# Patient Record
Sex: Male | Born: 1940 | Race: White | Hispanic: No | Marital: Married | Smoking: Former smoker
Health system: Southern US, Community
[De-identification: ages and names within clinical notes are randomized; demographics above are authoritative.]

## PROBLEM LIST (undated history)

## (undated) DIAGNOSIS — I10 Essential (primary) hypertension: Secondary | ICD-10-CM

## (undated) DIAGNOSIS — N189 Chronic kidney disease, unspecified: Secondary | ICD-10-CM

## (undated) DIAGNOSIS — Z5189 Encounter for other specified aftercare: Secondary | ICD-10-CM

## (undated) DIAGNOSIS — D649 Anemia, unspecified: Secondary | ICD-10-CM

## (undated) DIAGNOSIS — E119 Type 2 diabetes mellitus without complications: Secondary | ICD-10-CM

## (undated) DIAGNOSIS — L570 Actinic keratosis: Secondary | ICD-10-CM

## (undated) DIAGNOSIS — Z87442 Personal history of urinary calculi: Secondary | ICD-10-CM

## (undated) DIAGNOSIS — I499 Cardiac arrhythmia, unspecified: Secondary | ICD-10-CM

## (undated) HISTORY — DX: Actinic keratosis: L57.0

## (undated) HISTORY — PX: TONSILLECTOMY: SUR1361

---

## 2010-07-04 DIAGNOSIS — R809 Proteinuria, unspecified: Secondary | ICD-10-CM | POA: Insufficient documentation

## 2011-02-20 DIAGNOSIS — E669 Obesity, unspecified: Secondary | ICD-10-CM | POA: Insufficient documentation

## 2011-02-20 DIAGNOSIS — R011 Cardiac murmur, unspecified: Secondary | ICD-10-CM | POA: Insufficient documentation

## 2011-02-20 DIAGNOSIS — E1142 Type 2 diabetes mellitus with diabetic polyneuropathy: Secondary | ICD-10-CM | POA: Insufficient documentation

## 2011-02-20 DIAGNOSIS — R200 Anesthesia of skin: Secondary | ICD-10-CM | POA: Insufficient documentation

## 2012-02-06 ENCOUNTER — Ambulatory Visit: Payer: Self-pay | Admitting: Oncology

## 2012-02-27 ENCOUNTER — Inpatient Hospital Stay: Payer: Self-pay | Admitting: Internal Medicine

## 2012-02-27 LAB — COMPREHENSIVE METABOLIC PANEL
Albumin: 3.3 g/dL — ABNORMAL LOW (ref 3.4–5.0)
Alkaline Phosphatase: 22 U/L — ABNORMAL LOW (ref 50–136)
Anion Gap: 5 — ABNORMAL LOW (ref 7–16)
Calcium, Total: 8.5 mg/dL (ref 8.5–10.1)
Chloride: 107 mmol/L (ref 98–107)
Co2: 27 mmol/L (ref 21–32)
Creatinine: 2.15 mg/dL — ABNORMAL HIGH (ref 0.60–1.30)
EGFR (Non-African Amer.): 30 — ABNORMAL LOW
Total Protein: 6 g/dL — ABNORMAL LOW (ref 6.4–8.2)

## 2012-02-27 LAB — CBC WITH DIFFERENTIAL/PLATELET
Basophil #: 0 10*3/uL (ref 0.0–0.1)
Basophil %: 0.4 %
Eosinophil #: 0 10*3/uL (ref 0.0–0.7)
Eosinophil %: 0.9 %
HGB: 4.7 g/dL — CL (ref 13.0–18.0)
Lymphocyte #: 0.5 10*3/uL — ABNORMAL LOW (ref 1.0–3.6)
Lymphocyte %: 18 %
MCH: 24.3 pg — ABNORMAL LOW (ref 26.0–34.0)
MCHC: 32.5 g/dL (ref 32.0–36.0)
Neutrophil %: 73.9 %
Platelet: 103 10*3/uL — ABNORMAL LOW (ref 150–440)
RDW: 17.3 % — ABNORMAL HIGH (ref 11.5–14.5)

## 2012-02-27 LAB — PROTIME-INR
INR: 1.1
Prothrombin Time: 14.3 secs (ref 11.5–14.7)

## 2012-02-28 LAB — BASIC METABOLIC PANEL
BUN: 20 mg/dL — ABNORMAL HIGH (ref 7–18)
Calcium, Total: 8.3 mg/dL — ABNORMAL LOW (ref 8.5–10.1)
Co2: 23 mmol/L (ref 21–32)
EGFR (African American): 42 — ABNORMAL LOW
EGFR (Non-African Amer.): 36 — ABNORMAL LOW
Glucose: 153 mg/dL — ABNORMAL HIGH (ref 65–99)
Osmolality: 283 (ref 275–301)
Sodium: 139 mmol/L (ref 136–145)

## 2012-02-28 LAB — IRON AND TIBC
Iron Saturation: 8 %
Iron: 36 ug/dL — ABNORMAL LOW (ref 65–175)
Unbound Iron-Bind.Cap.: 436 ug/dL

## 2012-02-28 LAB — CBC WITH DIFFERENTIAL/PLATELET
Basophil #: 0 10*3/uL (ref 0.0–0.1)
Basophil %: 0.8 %
Eosinophil #: 0.1 10*3/uL (ref 0.0–0.7)
HCT: 18.3 % — ABNORMAL LOW (ref 40.0–52.0)
Lymphocyte %: 21.4 %
MCH: 25.6 pg — ABNORMAL LOW (ref 26.0–34.0)
MCHC: 33 g/dL (ref 32.0–36.0)
Neutrophil #: 2.5 10*3/uL (ref 1.4–6.5)
Neutrophil %: 69.5 %
Platelet: 107 10*3/uL — ABNORMAL LOW (ref 150–440)
RDW: 18.6 % — ABNORMAL HIGH (ref 11.5–14.5)
WBC: 3.6 10*3/uL — ABNORMAL LOW (ref 3.8–10.6)

## 2012-02-28 LAB — LACTATE DEHYDROGENASE: LDH: 145 U/L (ref 85–241)

## 2012-02-28 LAB — FOLATE: Folic Acid: 10.7 ng/mL (ref 3.1–100.0)

## 2012-02-29 LAB — HEMOGLOBIN: HGB: 6.9 g/dL — ABNORMAL LOW (ref 13.0–18.0)

## 2012-03-02 LAB — PROT IMMUNOELECTROPHORES(ARMC)

## 2012-03-03 LAB — URINE IEP, RANDOM

## 2012-03-07 ENCOUNTER — Ambulatory Visit: Payer: Self-pay | Admitting: Oncology

## 2012-03-08 ENCOUNTER — Ambulatory Visit: Payer: Self-pay | Admitting: Oncology

## 2012-03-08 LAB — CBC CANCER CENTER
Basophil %: 0.8 %
Eosinophil %: 1.1 %
HCT: 22.7 % — ABNORMAL LOW (ref 40.0–52.0)
HGB: 7.2 g/dL — ABNORMAL LOW (ref 13.0–18.0)
Lymphocyte #: 0.5 x10 3/mm — ABNORMAL LOW (ref 1.0–3.6)
MCH: 24 pg — ABNORMAL LOW (ref 26.0–34.0)
MCHC: 31.8 g/dL — ABNORMAL LOW (ref 32.0–36.0)
MCV: 75 fL — ABNORMAL LOW (ref 80–100)
Monocyte #: 0.2 x10 3/mm (ref 0.2–1.0)
Platelet: 118 x10 3/mm — ABNORMAL LOW (ref 150–440)
RBC: 3.02 10*6/uL — ABNORMAL LOW (ref 4.40–5.90)

## 2012-03-16 LAB — CBC CANCER CENTER
Basophil #: 0 x10 3/mm (ref 0.0–0.1)
Eosinophil #: 0.1 x10 3/mm (ref 0.0–0.7)
Eosinophil %: 1.3 %
Eosinophil: 2 %
HCT: 24.9 % — ABNORMAL LOW (ref 40.0–52.0)
Lymphocyte %: 15.6 %
MCH: 23.9 pg — ABNORMAL LOW (ref 26.0–34.0)
MCHC: 32.5 g/dL (ref 32.0–36.0)
MCV: 74 fL — ABNORMAL LOW (ref 80–100)
Monocytes: 9 %
Neutrophil #: 3.3 x10 3/mm (ref 1.4–6.5)
Neutrophil %: 74.7 %
Platelet: 220 x10 3/mm (ref 150–440)
RDW: 19.3 % — ABNORMAL HIGH (ref 11.5–14.5)
Segmented Neutrophils: 79 %
WBC: 4.4 x10 3/mm (ref 3.8–10.6)

## 2012-04-07 ENCOUNTER — Ambulatory Visit: Payer: Self-pay | Admitting: Oncology

## 2012-05-08 ENCOUNTER — Ambulatory Visit: Payer: Self-pay | Admitting: Oncology

## 2012-05-24 LAB — CBC CANCER CENTER
Basophil #: 0 x10 3/mm (ref 0.0–0.1)
Basophil %: 0.6 %
HGB: 12.1 g/dL — ABNORMAL LOW (ref 13.0–18.0)
Lymphocyte %: 20.9 %
MCH: 26 pg (ref 26.0–34.0)
MCHC: 33.4 g/dL (ref 32.0–36.0)
Neutrophil #: 2.8 x10 3/mm (ref 1.4–6.5)
Neutrophil %: 70.7 %
Platelet: 129 x10 3/mm — ABNORMAL LOW (ref 150–440)
RBC: 4.64 10*6/uL (ref 4.40–5.90)
RDW: 21.7 % — ABNORMAL HIGH (ref 11.5–14.5)
WBC: 3.9 x10 3/mm (ref 3.8–10.6)

## 2012-05-24 LAB — IRON AND TIBC
Iron Saturation: 13 %
Unbound Iron-Bind.Cap.: 346 ug/dL

## 2012-05-24 LAB — FERRITIN: Ferritin (ARMC): 23 ng/mL (ref 8–388)

## 2012-06-05 ENCOUNTER — Ambulatory Visit: Payer: Self-pay | Admitting: Oncology

## 2012-08-09 ENCOUNTER — Ambulatory Visit: Payer: Self-pay | Admitting: Family Medicine

## 2012-08-09 IMAGING — CT CT ABD-PELV W/O CM
1 of 2 series · 15 of 32 positions shown, 19 images · non-contrast
Comparison: none

REASON FOR EXAM: Abd Pain RUQ
COMMENTS:

[Series 2: abd 3mm wo 3.0 i40f 3 · axial · 0.96mm/px · z∈[-544,-55]mm · 15 of 181 slices shown, 19 images]
[im 9/181  soft-tissue]
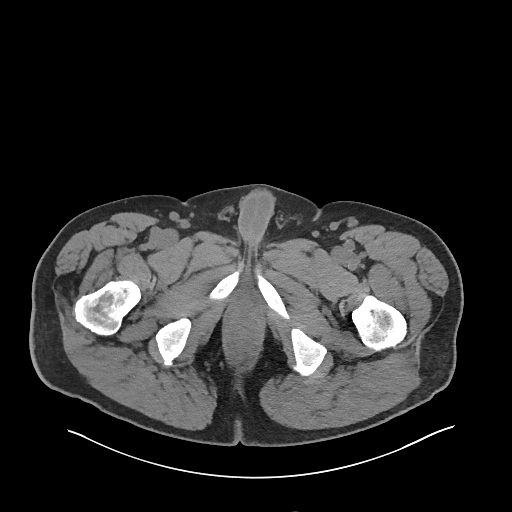
[im 9/181  bone]
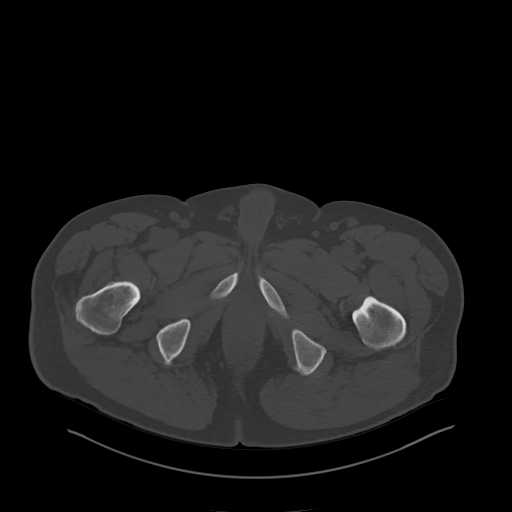
[im 25/181  soft-tissue]
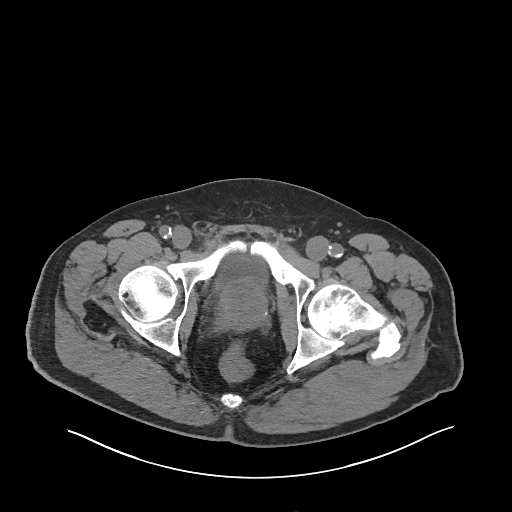
[im 41/181  soft-tissue]
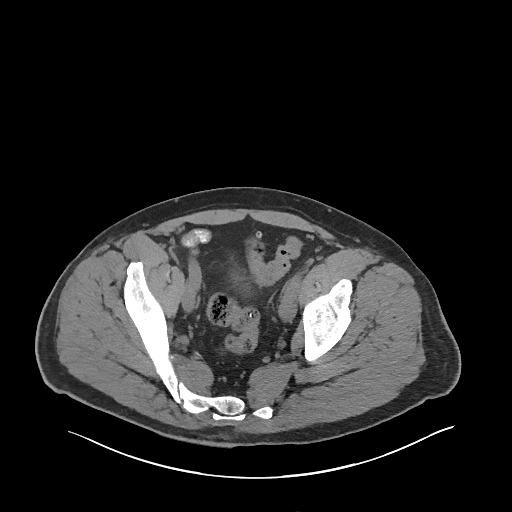
[im 50/181  soft-tissue]
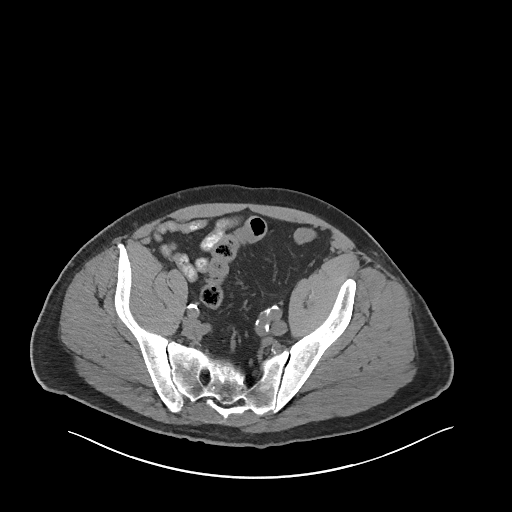
[im 66/181  soft-tissue]
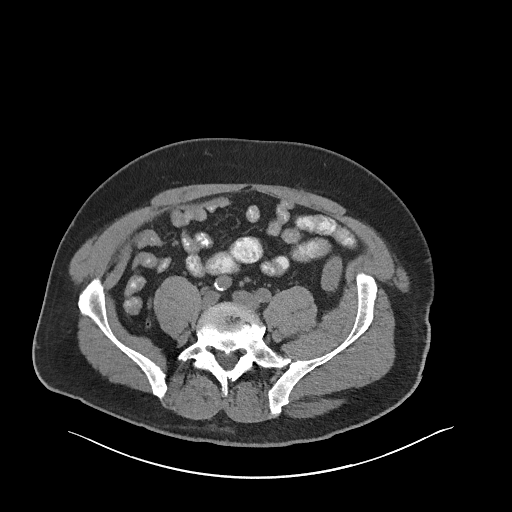
[im 74/181  soft-tissue]
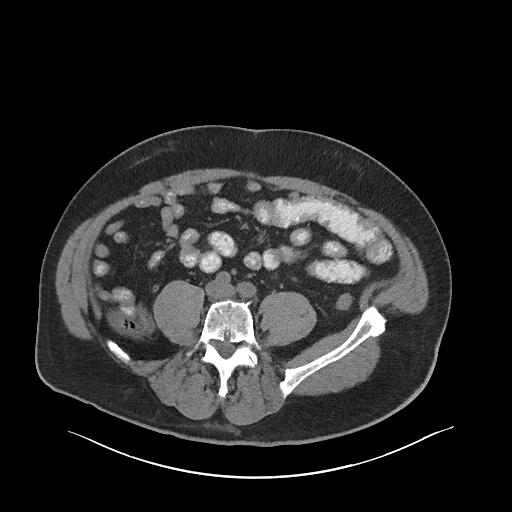
[im 91/181  soft-tissue]
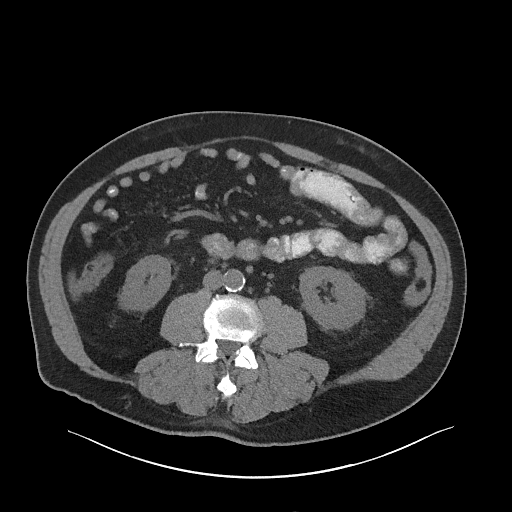
[im 107/181  soft-tissue]
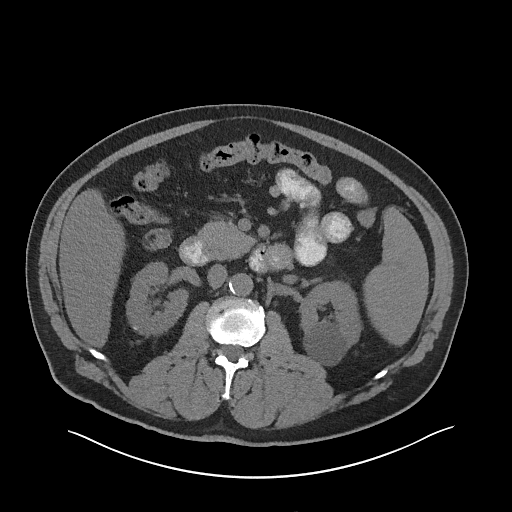
[im 115/181  soft-tissue]
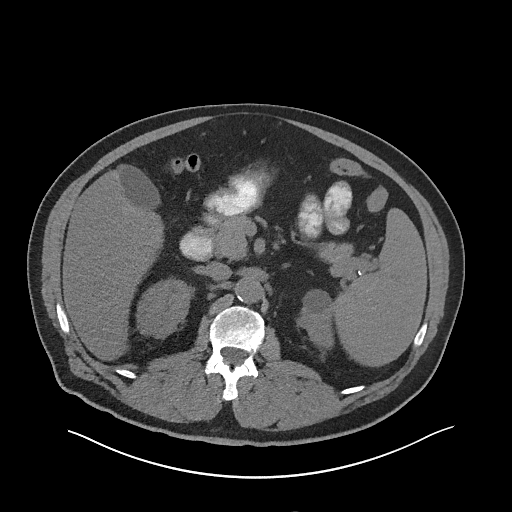
[im 115/181  bone]
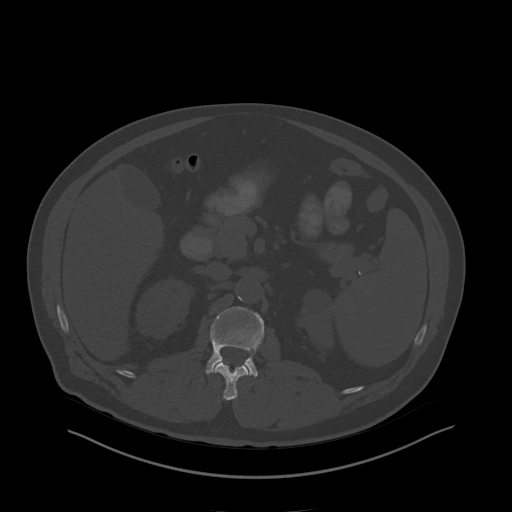
[im 131/181  soft-tissue]
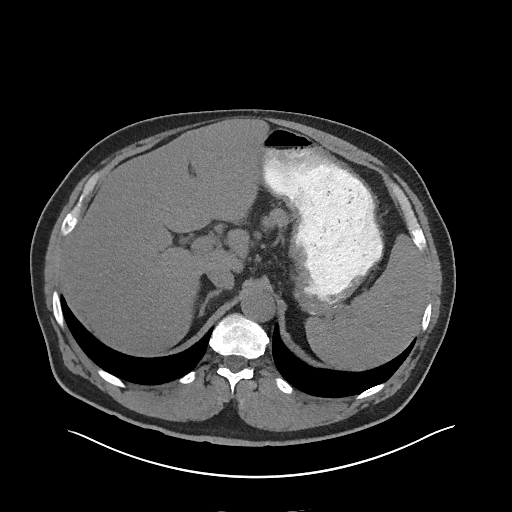
[im 140/181  soft-tissue]
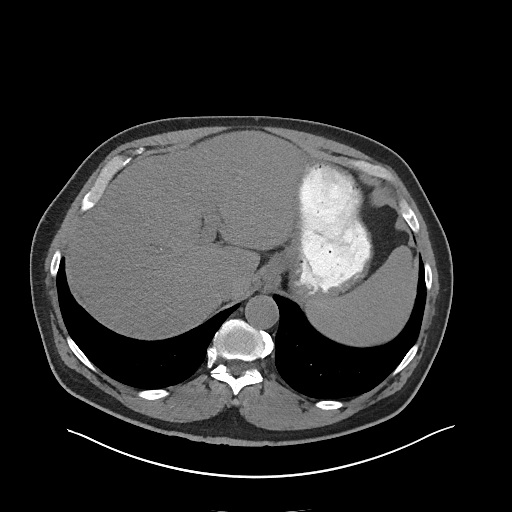
[im 148/181  lung]
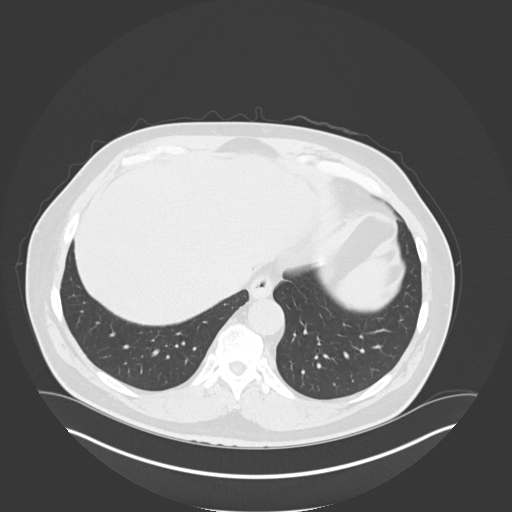
[im 156/181  soft-tissue]
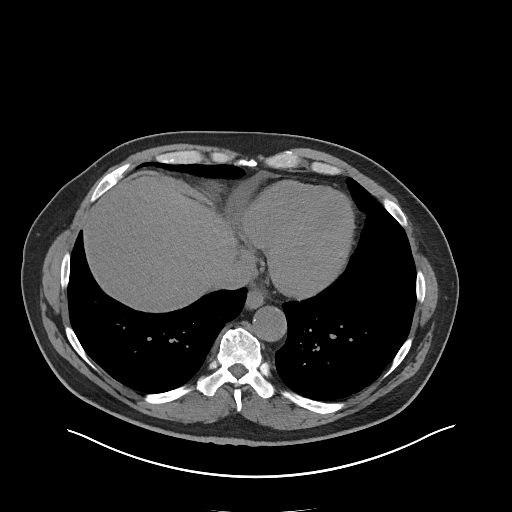
[im 156/181  lung]
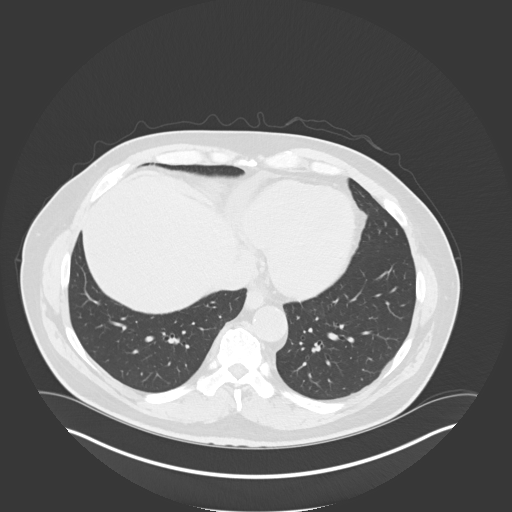
[im 164/181  lung]
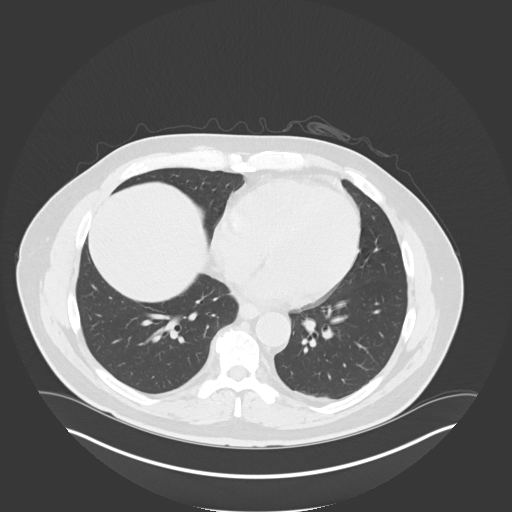
[im 172/181  soft-tissue]
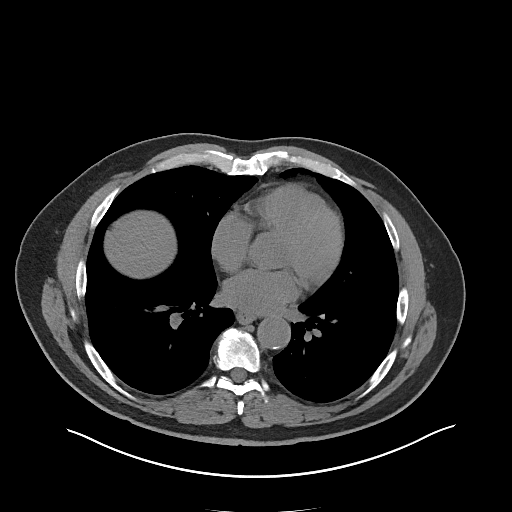
[im 172/181  lung]
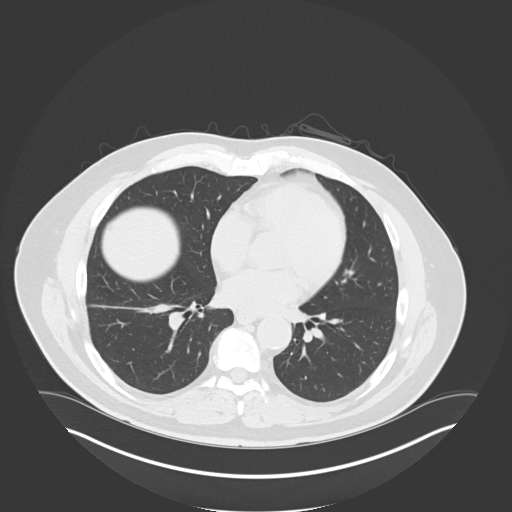

[15 of 32 positions shown; findings below may reference images not displayed]

PROCEDURE:     KCT - KCT ABDOMEN/PELVIS WO  - [DATE] [DATE]

RESULT:     CT of the abdomen and pelvis is performed utilizing oral
contrast only. The patient has abnormal renal function with an estimated GFR
of 40.

Multislice helical acquisition is reconstructed at 3 mm slice thickness
increments the patient had received oral contrast. The lung bases appear
clear. There appears to be a single stone in the gallbladder on image 60. A
nonobstructing lower pole left renal calculus is best appreciated on images
89 and 90 measuring approximately 6.5 mm in diameter. Low-attenuation
lesions are seen in the mid and upper pole region of the left kidney
consistent with cysts. The appendix appears normal. Atherosclerotic
calcification without aneurysm is seen within the abdominal aorta. There is
no ascites. The prostate is enlarged with calcification present. Correlate
with digital rectal exam and PSA. The adrenal glands appear to be
unremarkable. No pancreatic masses appreciated on this noncontrast study. No
hepatic or pancreatic ductal dilation is evident. The spleen just superior
to inferior dimension of approximately 16 cm. The descending colon a
relatively collapsed. There is no abnormal small bowel or gastric distention
or wall thickening. The abdominal wall appears intact. No adenopathy is
evident.
IMPRESSION: 1. Cholelithiasis.
2. Left nephrolithiasis. Probable left renal cystic masses. Mild
splenomegaly. Further assessment is limited given the lack of IV contrast.
3. Atherosclerotic calcification present.

[REDACTED]

## 2012-08-19 ENCOUNTER — Ambulatory Visit: Payer: Self-pay | Admitting: Oncology

## 2012-08-23 DIAGNOSIS — M6208 Separation of muscle (nontraumatic), other site: Secondary | ICD-10-CM | POA: Insufficient documentation

## 2012-08-23 LAB — CBC CANCER CENTER
Basophil %: 1 %
Eosinophil %: 2.2 %
HCT: 32.5 % — ABNORMAL LOW (ref 40.0–52.0)
Lymphocyte #: 0.8 x10 3/mm — ABNORMAL LOW (ref 1.0–3.6)
Lymphocyte %: 23.1 %
MCH: 29.9 pg (ref 26.0–34.0)
MCHC: 35 g/dL (ref 32.0–36.0)
Neutrophil #: 2.2 x10 3/mm (ref 1.4–6.5)
Neutrophil %: 67.9 %
Platelet: 116 x10 3/mm — ABNORMAL LOW (ref 150–440)
RBC: 3.8 10*6/uL — ABNORMAL LOW (ref 4.40–5.90)

## 2012-08-23 LAB — FERRITIN: Ferritin (ARMC): 33 ng/mL (ref 8–388)

## 2012-08-23 LAB — IRON AND TIBC
Iron Bind.Cap.(Total): 366 ug/dL (ref 250–450)
Iron Saturation: 25 %
Iron: 91 ug/dL (ref 65–175)

## 2012-09-05 ENCOUNTER — Ambulatory Visit: Payer: Self-pay | Admitting: Oncology

## 2012-09-29 DIAGNOSIS — I7090 Unspecified atherosclerosis: Secondary | ICD-10-CM | POA: Insufficient documentation

## 2012-09-29 DIAGNOSIS — N200 Calculus of kidney: Secondary | ICD-10-CM | POA: Insufficient documentation

## 2012-09-29 DIAGNOSIS — K8020 Calculus of gallbladder without cholecystitis without obstruction: Secondary | ICD-10-CM | POA: Insufficient documentation

## 2012-12-24 ENCOUNTER — Ambulatory Visit: Payer: Self-pay | Admitting: Oncology

## 2012-12-24 LAB — CBC CANCER CENTER
Basophil #: 0 x10 3/mm (ref 0.0–0.1)
Basophil %: 1.2 %
Eosinophil #: 0.1 x10 3/mm (ref 0.0–0.7)
Eosinophil %: 1.7 %
HCT: 36.5 % — ABNORMAL LOW (ref 40.0–52.0)
HGB: 12.9 g/dL — ABNORMAL LOW (ref 13.0–18.0)
MCHC: 35.3 g/dL (ref 32.0–36.0)
MCV: 82 fL (ref 80–100)
Monocyte #: 0.2 x10 3/mm (ref 0.2–1.0)
Monocyte %: 5.4 %
Neutrophil #: 2.5 x10 3/mm (ref 1.4–6.5)
Neutrophil %: 68.9 %
Platelet: 125 x10 3/mm — ABNORMAL LOW (ref 150–440)
RBC: 4.43 10*6/uL (ref 4.40–5.90)
RDW: 15.4 % — ABNORMAL HIGH (ref 11.5–14.5)

## 2012-12-24 LAB — IRON AND TIBC
Iron Bind.Cap.(Total): 367 ug/dL (ref 250–450)
Iron Saturation: 21 %
Unbound Iron-Bind.Cap.: 291 ug/dL

## 2012-12-24 LAB — FERRITIN: Ferritin (ARMC): 62 ng/mL (ref 8–388)

## 2013-01-05 ENCOUNTER — Ambulatory Visit: Payer: Self-pay | Admitting: Oncology

## 2013-08-22 ENCOUNTER — Ambulatory Visit: Payer: Self-pay | Admitting: Family Medicine

## 2013-08-22 IMAGING — US ULTRASOUND RETROPERITONEAL COMPLETE
2 series · 14 of 25 positions shown · non-contrast
Comparison: [DATE].

CLINICAL DATA: Abdominal aortic aneurysm.

EXAM:
RETROPERITONEAL ULTRASOUND COMPLETE
TECHNIQUE: Ultrasound examination of the abdominal aorta was performed to
evaluate for abdominal aortic aneurysm. The common iliac arteries,
IVC, and kidneys were also evaluated.

[Series 1: ultrasound retroperitoneal complete · 0.30mm/px · 12 of 57 slices shown (1 of 2)]
[im 1/57]
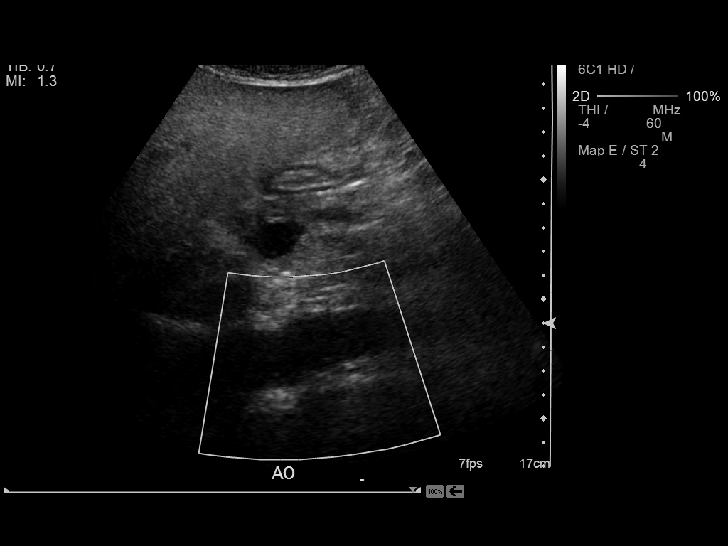
[im 6/57]
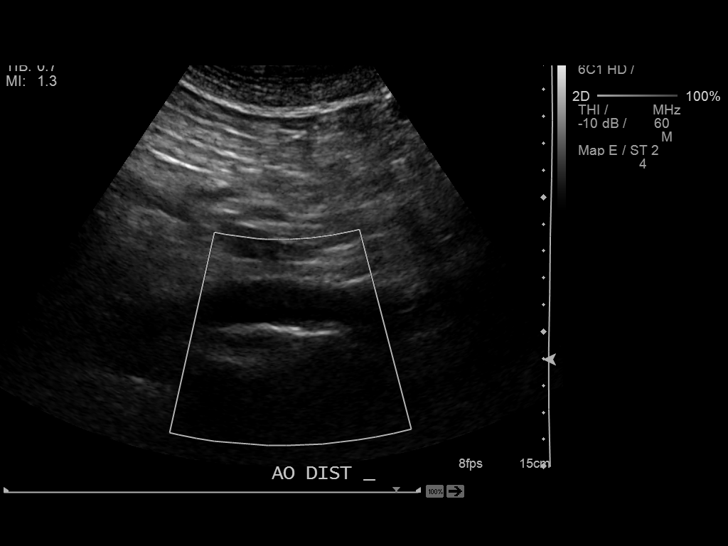
[im 11/57]
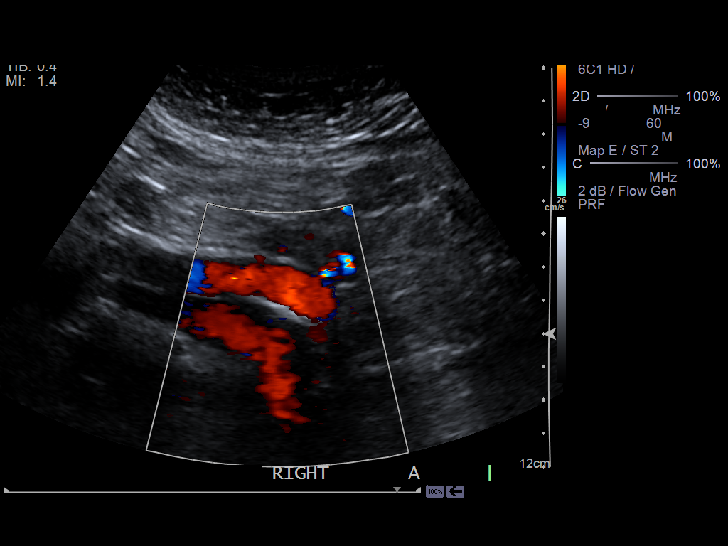
[im 17/57]
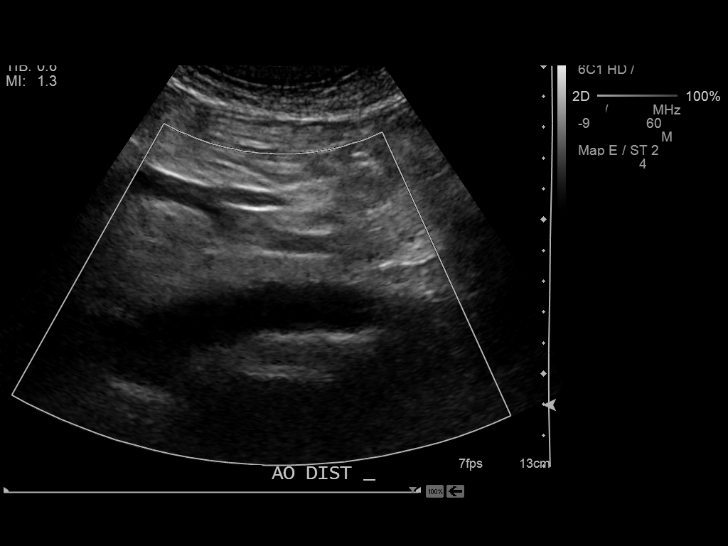
[im 22/57]
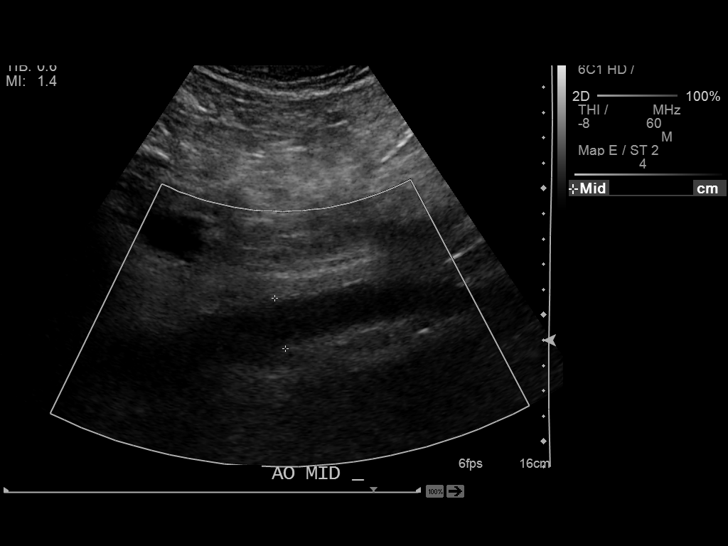
[im 25/57]
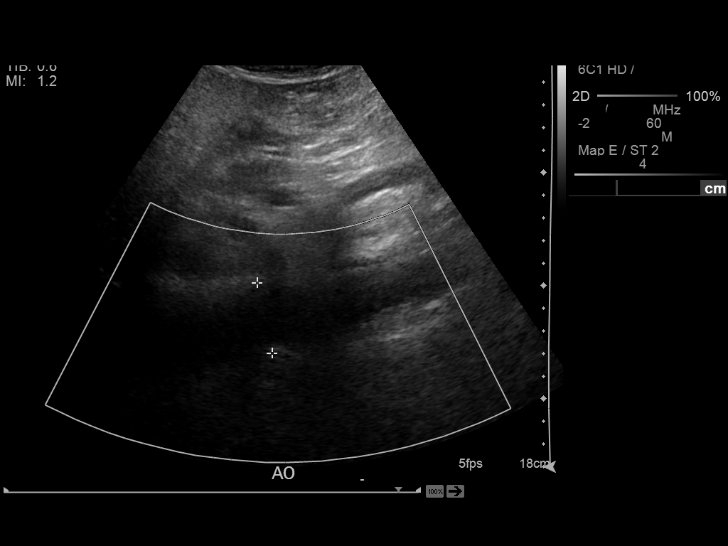
[im 30/57]
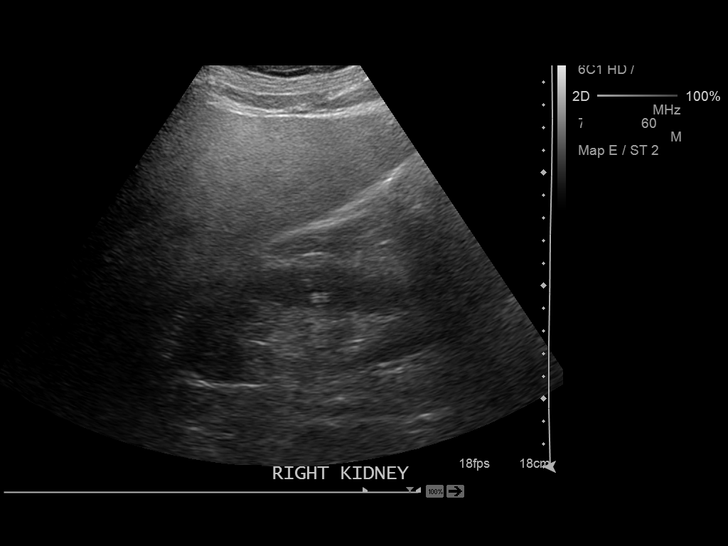
[im 35/57]
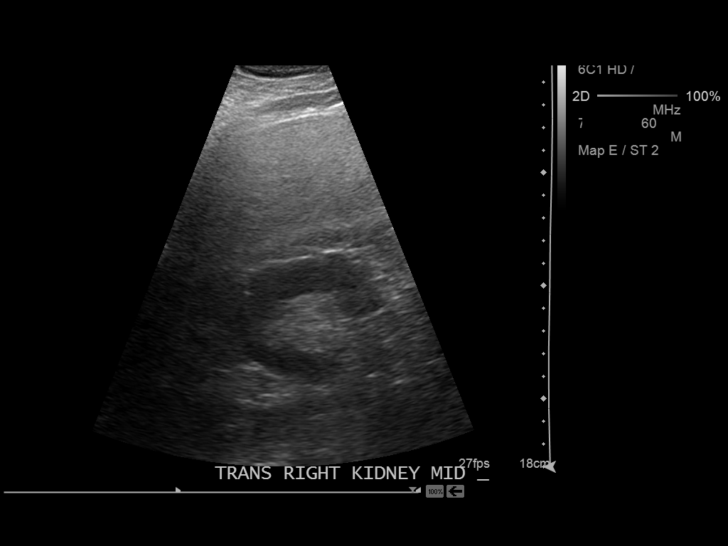
[im 41/57]
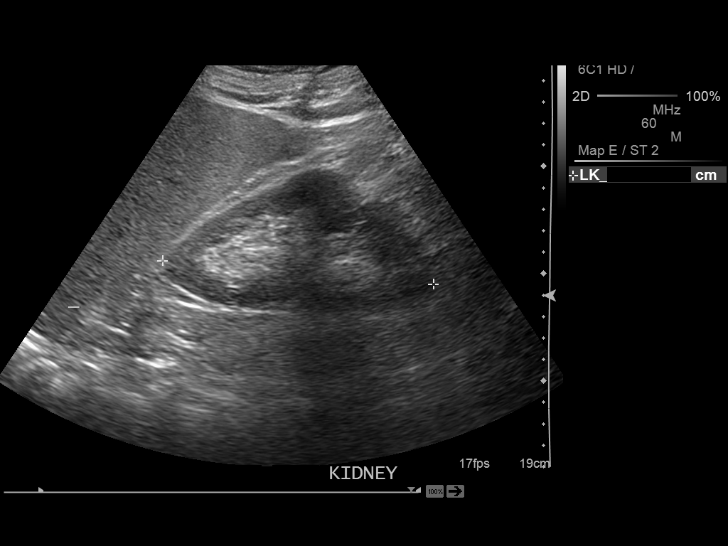
[im 43/57]
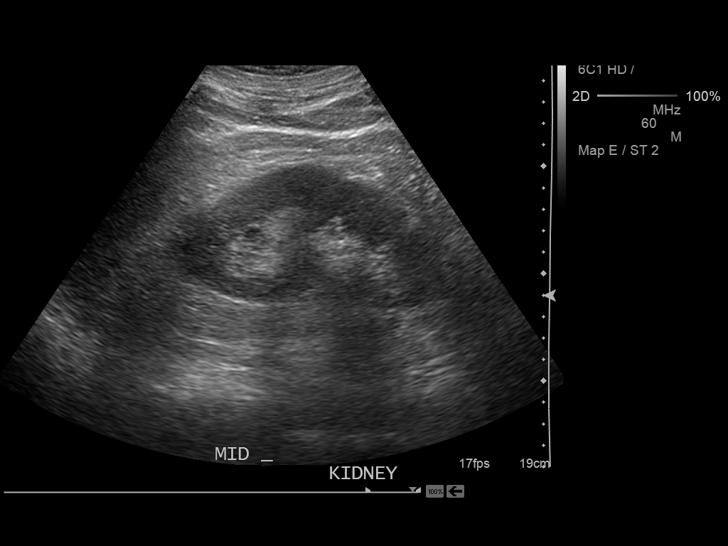
[im 49/57]
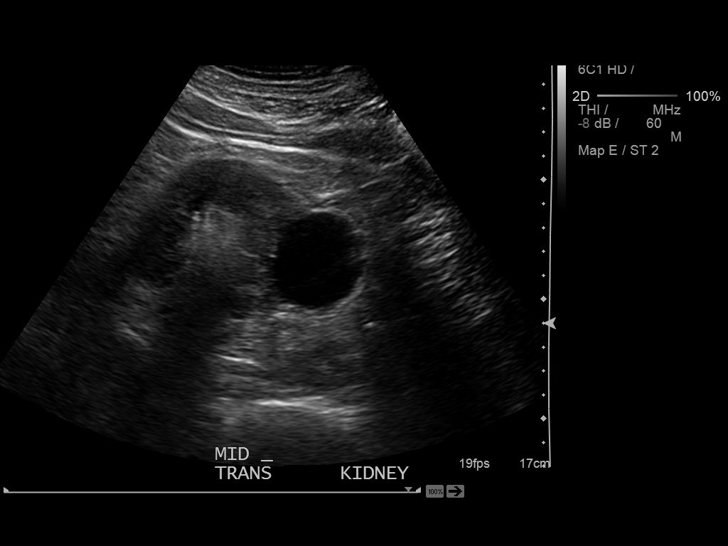
[im 54/57]
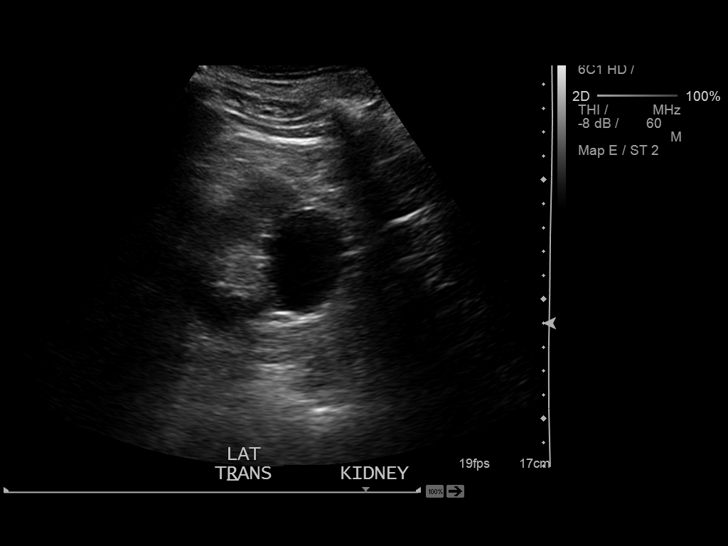

[Series 2001: ultrasound retroperitoneal complete · 0.28mm/px · 2 of 9 slices shown (2 of 2)]
[im 1/9]
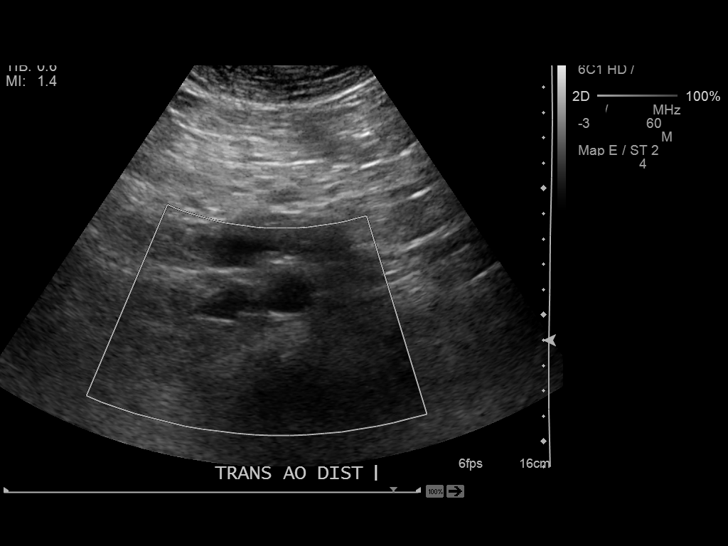
[im 9/9]
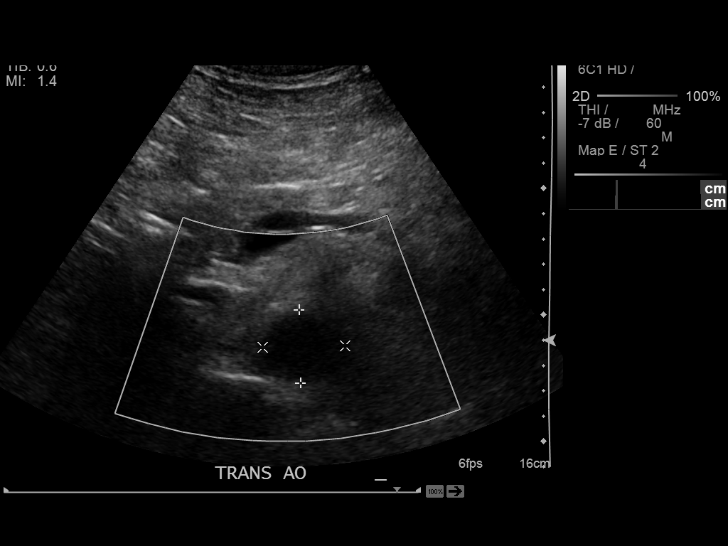

[14 of 25 positions shown; findings below may reference images not displayed]

FINDINGS: Abdominal Aorta

Mild dilation of proximal abdominal aorta.

Maximum AP

Diameter:  2.9 cm

Maximum TRV

Diameter: 3.3 cm

Right Common Iliac Artery

No aneurysm identified.

Left Common Iliac Artery

No aneurysm identified.

IVC

No abnormality visualized.

Right Kidney

Length: 12.9 cm. Echogenicity within normal limits. No mass or
hydronephrosis visualized.

Left Kidney

Length: 12.7 cm. No hydronephrosis. Cysts largest measuring up to
4.9 cm with minimal septation.
IMPRESSION: Mild dilation proximal abdominal aorta with maximal transverse
dimension 2.9 x 3.3 cm.

Left renal cyst largest measuring 4.9 cm with minimal septation.

## 2013-12-06 DIAGNOSIS — I7789 Other specified disorders of arteries and arterioles: Secondary | ICD-10-CM | POA: Insufficient documentation

## 2014-07-25 NOTE — Consult Note (Signed)
Brief Consult Note: Diagnosis: anemia, rectal bleeding.   Patient was seen by consultant.   Recommend further assessment or treatment.   Discussed with Attending MD.   Comments: Pateitn seen and examined, discussed with Dr Posey Pronto last night. Full consult to follow.  Patietn presenting with recurrent hematochezia, profound anemia and pancytopenia.  Currently undergoing tfx.  Denies GI sx other than intermittant rectal bleeding for about 2 weeks.  Patietn with similar presentation a little over a year ago, with GI eval done in [place], then apparently treated with iv iron supplement.   I would be unusual for hemorrhoidal bleeding to cause anemia such as seen, and I agree with H/O evaluation.  Recommend getting the results of his last egd and comonoscopy done about 2 years ago.  Apparently the recs at that time were repeat in 5 years.  He states the rectal bleeding he had last year resolved after hemorrhoidal ligation by Dr Tamala Julian (here).  Further recs to follow.  Electronic Signatures: Loistine Simas (MD)  (Signed [phone] 17:53)  Authored: Brief Consult Note   Last Updated: 23-Nov-13 17:53 by Loistine Simas (MD)

## 2014-07-25 NOTE — Discharge Summary (Signed)
PATIENT NAME:  Gregory Dixon, Gregory Dixon MR#:  [id number] DATE OF BIRTH:  [birth year]  DATE OF ADMISSION:  02/27/2012 DATE OF DISCHARGE:  02/29/2012  PRIMARY CARE PHYSICIAN: Valera Castle, MD at [place] [facility]  ONCOLOGIST: Delight Hoh, MD  GASTROENTEROLOGISET: Lollie Sails, MD   PRESENTING COMPLAINT: Dizziness.   DISCHARGE DIAGNOSES:  1. Acute on chronic iron deficiency anemia.  2. Pancytopenia, work-up as outpatient with Dr. Grayland Ormond.  3. Hypertension.  4. Type 2 diabetes. 5. Chronic kidney disease, stage 3.   MEDICATIONS:  1. Coreg 25 mg b.i.d.  2. Amlodipine 5 mg daily.  3. Hydrochlorothiazide/losartan 25/100 one tablet daily.  4. Terazosin 10 mg at bedtime.  5. Imdur 60 mg extended-release p.o. daily.  6. Hydralazine 50 mg four times a day.  7. Aspirin 81 mg daily.  8. Humalog 75/25, 50 units b.i.d.  9. One-A-Day vitamin p.o. daily.  10. Fenofibrate 160 mg daily.   DIET: Low sodium, ADA 1800 calorie diet.   FOLLOWUP:  1. Follow up with Dr. Grayland Ormond in one to two weeks. Dr. Gary Fleet [facility]  is to call for appointment.   2. Follow up with Dr. Kym Groom in one to two weeks.   3. Follow up with Dr. Gustavo Lah, if needed, for GI bleed. LABORATORY, DIAGNOSTIC AND RADIOLOGICAL DATA: Hemoglobin 6.9 at discharge. LDH 145. Folic acid 0000000. Ferritin 4,  serum iron is 36.0.  Direct Coombs test negative. White count is 3.6, platelet count is 107. Creatinine is 1.85, sodium is 139, potassium 4.1, chloride is 109.    BRIEF SUMMARY OF HOSPITAL COURSE: Mr. Brannigan is a very pleasant 74 year old Caucasian gentleman with past medical history of hypertension and diabetes, comes in with:   1. Acute on chronic anemia: The patient initially was thought to have slow gastrointestinal bleed, however, he does not have any active bleeding. He was found to have a hemoglobin of 4.3, is status post 3 units blood transfusion. Hemoglobin is 6.9 at discharge.  His symptoms of dizziness resolved. He was  seen by Dr. Grayland Ormond, given his pancytopenia and chronic anemia, and needs further work-up with bone marrow aspiration, which the patient will be set up for it as outpatient by Dr. Grayland Ormond. The patient did not have any active GI bleed. He was seen by Dr. Gustavo Lah. No further work-up is recommended at this time.  2. Hypertension: His home medications were continued.  3. Type 2 diabetes: We continued insulin 75/25.  4. Chronic kidney disease, stage 3: Creatinine was 2.15 on admission, at discharge was 1.85.  5. Hyperlipidemia: On fenofibrate   The hospital stay otherwise remained stable.    CODE STATUS:  The patient remained a FULL CODE.     TIME SPENT: 40 minutes.   ____________________________ Hart Rochester Posey Pronto, MD sap:cbb D: 02/29/2012 13:25:47 ET T: 03/01/2012 12:36:38 ET JOB#: [id number]  cc: Kaire Stary A. Posey Pronto, MD, <Dictator> Valera Castle, MD Kathlene November. Grayland Ormond, MD Lollie Sails, MD Ilda Basset MD ELECTRONICALLY SIGNED 03/15/2012 6:58

## 2014-07-25 NOTE — Consult Note (Signed)
PATIENT NAME:  Gregory Dixon, Gregory Dixon MR#:  [id number] DATE OF BIRTH:  [birth year]  DATE OF CONSULTATION:  02/28/2012  REFERRING PHYSICIAN:  Fritzi Mandes, MD  CONSULTING PHYSICIAN:  Lollie Sails, MD  HISTORY OF PRESENT ILLNESS: Gregory Dixon is a 74 year old Caucasian male who came to the Emergency Room after being seen in his primary physician's office with fatigue and dizziness. He states that he was seen actually at his primary physician's office on 01/05/2012 and tells me that labs were drawn at that time and he was not notified of anything abnormal. Then he had several weeks of some intermittent hematochezia. This was enough at times to change the color of the toilet bowl but was mostly seen on the toilet paper. He denied any problems with nausea, vomiting, or abdominal pain. There is no heartburn or dysphagia. He had a bowel movement daily. No black stools. No mucousy stools. The bleeding he saw was bright red. He has a history in that a little over a year ago he also had an episode of bleeding hemorrhoids for which he did undergo evaluation apparently in [place]. He was seen by Dr. Rochel Brome here at [facility] and underwent a banding hemorrhoidectomy which seemed to resolve his hemorrhoidal bleeding at that time. However, prior to that time he had been followed by a Hematology/Oncology doctor in [place], [place] who was giving him IV iron apparently. I am uncertain as to whether the anemia was directly related with hemorrhoidal bleeding or whether this was a secondary issue. The patient had a colonoscopy with polyps removed about two years ago along with an upper scope. Follow-up recommendations that he was told at that time was five years.   PAST MEDICAL HISTORY:  1. History of chronic anemia with iron deficiency as noted above.  2. Recurrent rectal bleeding in regards to hemorrhoids/internal.  3. History of benign prostatic hypertrophy.  4. Type II diabetes, insulin requiring.   5. Hyperlipidemia.  6. Hypertension.   PAST SURGICAL HISTORY:  1. Remote tonsillectomy.  2. Hemorrhoidectomy as noted above.   OUTPATIENT MEDICATIONS:  1. Amlodipine 5 mg. 2. Aspirin 81 mg once a day. 3. Coreg 25 mg twice a day. 4. Fenofibrate 160 mg once a day. 5. Humalog 75/25 50 units twice a day. 6. Hydralazine 50 mg 4 times a day. 7. Hydrochlorothiazide/losartan 25/100 mg once a day  8. Isosorbide mononitrate 60 mg once a day. 9. One-A-Day Vitamin.  10. Terazosin 10 mg once a day.   ALLERGIES: He is allergic to codeine.   PHYSICAL EXAMINATION:   VITAL SIGNS: Temperature 98.6, pulse 79, respirations 20, blood pressure 150/67, pulse oximetry 97%.   GENERAL: He is a well appearing 74 year old Caucasian male in no acute distress. 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   HEENT: Normocephalic, atraumatic.   EYES: Anicteric.   NOSE: Septum midline.   OROPHARYNX: No lesions. He has plates upper and lower.   NECK: Supple. No JVD. No lymphadenopathy. No thyromegaly.   HEART: Regular rate and rhythm.   LUNGS: Clear.   ABDOMEN: Soft, nontender, nondistended. Bowel sounds positive, normoactive.   RECTAL: Anorectal examination shows no evidence of gross rectal bleeding. There are multiple external skin tags. There was no stool in the vault.  EXTREMITIES: No clubbing, cyanosis, or edema.   NEUROLOGICAL: Cranial nerves II through XII grossly intact. Muscle strength bilaterally equal and symmetric, 5/5. Deep tendon reflexes bilaterally equal and symmetric.   LABORATORY, DIAGNOSTIC, AND RADIOLOGICAL DATA:  Laboratories on  11/23 show him to have a serum iron of 36, folic acid of 0000000, TIBC 472, iron saturation 8. He had a hemogram with white count 3.6, hemoglobin and hematocrit 6.0/18.3, platelet count 107, MCV 78. Coombs negative. BUN 20, serum creatinine 1.85, sodium 139, potassium 4.1, chloride 109, bicarb 23. There were no imaging studies.   ASSESSMENT:  History of bright red rectal bleeding, sounds much like recurrent problems with hematochezia from internal hemorrhoids. However, the patient also has presented with a profound anemia and pancytopenia. When I saw him on the floor he was undergoing transfusion. He has been treated by a hematology doctor with iron supplementation and my concern is that he has an anemia other than that from GI blood loss. Recommend Hematology/Oncology evaluation. Will obtain the results from his last EGD and colonoscopy prior to planning any luminal evaluation. He shows no gross rectal bleeding on this evaluation with me today. He may need to see Dr. Tamala Julian in follow-up in regards to repeat hemorrhoidal ligation treatment. Discussed with Dr. Posey Pronto.   ____________________________ Lollie Sails, MD mus:drc D: 02/29/2012 16:29:55 ET T: 03/01/2012 10:04:16 ET JOB#: [id number]  cc: Lollie Sails, MD, <Dictator> Lollie Sails MD ELECTRONICALLY SIGNED 04/04/2012 17:27

## 2014-07-25 NOTE — H&P (Signed)
PATIENT NAME:  Gregory Dixon, Gregory Dixon MR#:  [id number] DATE OF BIRTH:  [birth year]  DATE OF ADMISSION:  02/27/2012  PRIMARY CARE PHYSICIAN: Dr. Johny Drilling at [facility] in [place].  CHIEF COMPLAINT: Abnormal hemoglobin.   HISTORY OF PRESENT ILLNESS: Mr. Lenhart is a pleasant 74 year old Caucasian gentleman with past medical history of hypertension, hyperlipidemia, and diabetes, comes into the Emergency Room after he was seen at primary care physician's office for ongoing dizziness, lightheadedness, and exertional shortness of breath for about a week. The patient was found to have a hemoglobin of 4.3 in the office and was sent to the Emergency Room for further evaluation and management.   In the Emergency Room, the patient is hemodynamically stable, denies any chest pain, abdominal pain. He has some exertional shortness of breath. He reports he has been noticing some bright red blood on his toilet paper for the past one week. He denies any frank blood or passing any blood clots. He denies any constipation. He denies taking any NSAIDs or any other over-the-counter medications. The patient has also not taken his aspirin for the last 1 to 2 weeks. His hemoglobin was found to be 4.7. He is being admitted for further evaluation and management.   PAST MEDICAL HISTORY:  1. Chronic anemia, appears iron deficiency anemia. Per patient, he was on IV iron therapy through Hematology/Oncology in [place]. He also had required 4 units of blood transfusion last year, and GI work-up showed he has swollen enlarged hemorrhoids on his colonoscopy. The patient then moved to [facility] and underwent hemorrhoidectomy surgery by Dr. Tamala Julian which was done about 15 months ago. Since then he has not had any other issues.  2. History of benign prostatic hypertrophy.  3. Hypertension.  4. Type 2 diabetes, insulin requiring. 5. Obesity.  6. Hyperlipidemia.    PAST SURGICAL HISTORY:  1. Tonsillectomy.   2. Hemorrhoidectomy.   ALLERGIES: Codeine.  MEDICATIONS:  1. Amlodipine 5 mg p.o. daily.  2. Aspirin 81 mg p.o. daily.  3. Coreg 25 mg b.i.d.  4. Fenofibrate 160 mg p.o. daily.  5. Humalog 75/25, 50 units subcutaneous b.i.d.  6. Hydralazine 50 mg four times a day.  7. Hydrochlorothiazide/losartan 25/100 daily FAMILY HISTORY: Positive for diabetes.   SOCIAL HISTORY: Married. He lives with his wife in [place]. Former smoker. Nonalcoholic.   REVIEW OF SYSTEMS: CONSTITUTIONAL: Positive for fatigue, weakness. No fever. EYES: No blurred or double vision. ENT: No tinnitus, ear pain, hearing loss. RESPIRATORY: No cough, wheeze, hemoptysis. Positive for exertional dyspnea. CARDIOVASCULAR: No chest pain, orthopnea, edema, or hypertension. GI: No nausea, vomiting, diarrhea, or abdominal pain. GU: No dysuria or hematuria. ENDOCRINE: No polyuria or nocturia. HEMATOLOGY: Positive for chronic anemia. SKIN: No acne or rash. MUSCULOSKELETAL: Positive for arthritis. NEUROLOGICAL: No cerebrovascular accident, transient ischemic attack. PSYCHIATRIC: No anxiety or depression. All other systems reviewed and negative.   PHYSICAL EXAMINATION:  GENERAL: The patient is awake, alert, oriented x3, not in acute distress.   VITAL SIGNS: Afebrile, pulse is 92, blood pressure 156/77, saturations are 97% on room air.   HEENT: Atraumatic, normocephalic. Pupils are equal, round, and reactive to light and accommodation. Extraocular movements intact. Oral mucosa is moist.  Conjunctival pallor is present.   NECK: Supple. No JVD. No carotid bruit.   LUNGS: Clear to auscultation bilaterally. No rales, rhonchi, respiratory distress, or labored breathing.   HEART: Both the heart sounds are normal. Rate, rhythm is regular. PMI not lateralized. Chest is nontender.   EXTREMITIES: Good pedal pulses,  good femoral pulses. No lower extremity edema.   ABDOMEN: Soft, benign, and nontender. No organomegaly. Positive bowel sounds.    NEUROLOGIC: Grossly intact cranial nerves II through XII. No motor or sensory deficits.   PSYCHIATRIC: The patient is awake, alert, oriented x3.   SKIN: Warm and dry.   LABORATORY, DIAGNOSTIC AND RADIOLOGICAL DATA: Hemoglobin and hematocrit is 4.7 and 14.4, white count is 3.1, platelet count is 103, MCV is 75. Glucose is 289, BUN is 26, creatinine is 2.15, sodium is 139, potassium 4.3, chloride 107, bicarbonate 27, albumin is 3.3. PT-INR within normal limits.   ASSESSMENT AND PLAN: Mr. Hruza is a 74 year old with history of hypertension and diabetes, comes in with:   1. Acute on chronic anemia with possible slow GI bleed, rule out other causes of anemia: Given pancytopenia, need to rule out myelodysplasia/myelofibrosis. The patient came in with hemoglobin of 4.3. He felt dizzy, lightheaded, with a minimal amount of blood on the toilet paper for the last week. He underwent hemorrhoid surgery about 15 months ago by Dr. Tamala Julian here at [facility]. He required 4 units of blood transfusion prior to surgery to stabilize hemoglobin and was also on IV iron therapy in [place]. The patient had colonoscopy about two years ago which showed enlarged hemorrhoids. Colonoscopy was also done in [place]. He is currently not on any iron pills or IV iron treatment. The patient will be admitted on the Medical floor. We will start a regular soft diabetic diet. We will give 2 units of blood transfusion tonight. Check hemoglobin in the morning, transfuse as needed. I spoke with Dr. Gustavo Lah. Since the patient is otherwise asymptomatic, hemodynamically stable, and no abdominal pain, no further indication for a bleeding scan or CT scan at this time. I will do it as needed. We will have Oncology see the patient in the morning. Blood transfusion consent was obtained. The patient understands the risks and benefits.  2. Hypertension: Continue home medications which are Coreg, amlodipine, hydrochlorothiazide, losartan  and hydralazine.  3. Type 2 diabetes, on insulin: We will continue insulin 75/25 along with sliding scale.  4. Hyperlipidemia: On fenofibrate.   5. Further work-up according to the patient's clinical course.   The hospital admission plan was discussed with the patient. The above case was also discussed with  Dr. Gustavo Lah.   TIME SPENT: 40 minutes.    ____________________________ Hart Rochester Posey Pronto, MD sap:cbb D: 02/27/2012 18:22:59 ET T: 02/27/2012 21:55:37 ET JOB#: [id number]  cc: Richerd Grime A. Posey Pronto, MD, <Dictator> Valera Castle, MD Ilda Basset MD ELECTRONICALLY SIGNED 02/29/2012 13:41

## 2014-07-25 NOTE — Consult Note (Signed)
History of Present Illness:   Reason for Consult Pancytopenia.    HPI   Patient is a 74 year old male with past medical history significant for recurrent GI bleed requiring multiple iron infusions as well as blood transfusions.  This occurred in [place].  Over the past several months patient became progressively weak and fatigued and also noted dyspnea on exertion.  Recently he was persistently dizzy.  Upon arrival to his primary care, he was found to have a hemoglobin of 4.3 and was subsequently sent to the emergency room for further evaluation.  Patient received 2 units packed red blood cells overnight and feels significantly better and nearly back to his baseline.  He has noted occasional blood in his stool, but no significant bleed.  He denies any bruising.  He has no recent fevers or infections.  He has no chest pain or shortness of breath.  He denies any nausea, vomiting, constipation, or diarrhea.  Has noted no melena.  He has no urinary complaints.  Patient otherwise feels well and offers no further specific complaints.  PFSH:   Additional Past Medical and Surgical History Past medical history: Anemia, BPH, hypertension, diabetes, hyperlipidemia.  Past surgical history: Hemorrhoidectomy, tonsillectomy.  Family history: Diabetes.  Social history: Distant tobacco use, denies alcohol.   Review of Systems:   Performance Status (ECOG) 0    Review of Systems   As per HPI. Otherwise, 10 point system review was negative.   NURSING NOTES: **Vital Signs.:   23-Nov-13 13:00    Vital Signs Type: 15 min Post Blood Start Time    Temperature Temperature (F): 98.3    Celsius: 36.8    Temperature Source: oral    Pulse Pulse: 82    Respirations Respirations: 20    Systolic BP Systolic BP: 268    Diastolic BP (mmHg) Diastolic BP (mmHg): 55    Mean BP: 83    Pulse Ox % Pulse Ox %: 96    Oxygen Delivery: Room Air/ 21 %   Physical Exam:   Physical Exam General:  Well-developed, well-nourished, no acute distress. Eyes: Pink conjunctiva, anicteric sclera. HEENT: Normocephalic, moist mucous membranes, clear oropharnyx. Lungs: Clear to auscultation bilaterally. Heart: Regular rate and rhythm. No rubs, murmurs, or gallops. Abdomen: Soft, nontender, nondistended. No organomegaly noted, normoactive bowel sounds. Musculoskeletal: No edema, cyanosis, or clubbing. Neuro: Alert, answering all questions appropriately. Cranial nerves grossly intact. Skin: No rashes or petechiae noted. Psych: Normal affect. Lymphatics: No cervical, calvicular, axillary or inguinal LAD.    Codeine: N/V/Diarrhea    Coreg 25 mg oral tablet: 1 tab(s) orally 2 times a day, Active, 0, None   amlodipine 5 mg oral tablet: 1 tab(s) orally once a day, Active, 0, None   hydrochlorothiazide-losartan 25 mg-100 mg oral tablet: 1 tab(s) orally once a day, Active, 0, None   terazosin 10 mg oral capsule: 1 cap(s) orally once a day (at bedtime), Active, 0, None   isosorbide mononitrate 60 mg oral tablet, extended release: 1 tab(s) orally once a day (in the morning), Active, 0, None   hydrALAZINE 50 mg oral tablet: 1 tab(s) orally 4 times a day, Active, 0, None   aspirin 81 mg oral tablet, chewable: 1 tab(s) orally once a day, Active, 0, None   Humalog Mix 75/25 subcutaneous suspension: 50 unit(s) subcutaneous 2 times a day, Active, 0, None   One-A-Day 50+ oral tablet: 1 tab(s) orally once a day, Active, 0, None   fenofibrate 160 mg oral tablet: 1 tab(s) orally  once a day, Active, 0, None  Laboratory Results: Routine Chem:  23-Nov-13 05:05    Glucose, Serum  153   BUN  20   Creatinine (comp)  1.85   Sodium, Serum 139   Potassium, Serum 4.1   Chloride, Serum  109   CO2, Serum 23   Calcium (Total), Serum  8.3   Anion Gap 7   Osmolality (calc) 283   eGFR (African American)  42   eGFR (Non-African American)  36 (eGFR values <39m/min/1.73 m2 may be an indication of  chronic kidney disease (CKD). Calculated eGFR is useful in patients with stable renal function. The eGFR calculation will not be reliable in acutely ill patients when serum creatinine is changing rapidly. It is not useful in  patients on dialysis. The eGFR calculation may not be applicable to patients at the low and high extremes of body sizes, pregnant women, and vegetarians.)  Routine Hem:  23-Nov-13 05:05    WBC (CBC)  3.6   RBC (CBC)  2.36   Hemoglobin (CBC)  6.0   Hematocrit (CBC)  18.3   Platelet Count (CBC)  107   MCV  78   MCH  25.6   MCHC 33.0   RDW  18.6   Neutrophil % 69.5   Lymphocyte % 21.4   Monocyte % 6.8   Eosinophil % 1.5   Basophil % 0.8   Neutrophil # 2.5   Lymphocyte #  0.8   Monocyte # 0.2   Eosinophil # 0.1   Basophil # 0.0 (Result(s) reported on 28 Feb 2012 [facility])   Assessment and Plan:  Impression:   Pancytopenia.  Plan:   1.  Pancytopenia: Unclear etiology.  Given his is significantly decreased hemoglobin without evidence of significant GI bleed as well as leukopenia and thrombocytopenia, it is likely patient has an underlying bone marrow disorder.  To confirm this would require bone marrow biopsy which can be accomplished after discharge.  Agree with 3rd unit of packed red blood cells to increase patient's hemoglobin to greater than 7.0.  No intervention is needed regarding his leukopenia or his thrombocytopenia. Will order baseline labs for completeness.   consult, will follow.  Electronic Signatures: FDelight Hoh(MD)  (Signed [phone])  Authored: HISTORY OF PRESENT ILLNESS, PFSH, ROS, NURSING NOTES, PE, ALLERGIES, HOME MEDICATIONS, LABS, ASSESSMENT AND PLAN   Last Updated: 23-Nov-13 14:52 by FDelight Hoh(MD)

## 2015-10-16 DIAGNOSIS — I10 Essential (primary) hypertension: Secondary | ICD-10-CM | POA: Diagnosis present

## 2015-10-16 DIAGNOSIS — N184 Chronic kidney disease, stage 4 (severe): Secondary | ICD-10-CM

## 2015-10-16 DIAGNOSIS — E1122 Type 2 diabetes mellitus with diabetic chronic kidney disease: Secondary | ICD-10-CM

## 2017-10-21 DIAGNOSIS — C4491 Basal cell carcinoma of skin, unspecified: Secondary | ICD-10-CM

## 2017-10-21 HISTORY — DX: Basal cell carcinoma of skin, unspecified: C44.91

## 2019-04-27 ENCOUNTER — Encounter: Payer: Self-pay | Admitting: Emergency Medicine

## 2019-04-27 ENCOUNTER — Other Ambulatory Visit: Payer: Self-pay

## 2019-04-27 ENCOUNTER — Inpatient Hospital Stay
Admission: EM | Admit: 2019-04-27 | Discharge: 2019-04-29 | DRG: 378 | Disposition: A | Discharge status: Home / Self Care | Payer: Medicare Other | Attending: Internal Medicine | Admitting: Internal Medicine

## 2019-04-27 DIAGNOSIS — Z794 Long term (current) use of insulin: Secondary | ICD-10-CM | POA: Diagnosis not present

## 2019-04-27 DIAGNOSIS — I10 Essential (primary) hypertension: Secondary | ICD-10-CM | POA: Diagnosis not present

## 2019-04-27 DIAGNOSIS — D519 Vitamin B12 deficiency anemia, unspecified: Secondary | ICD-10-CM | POA: Diagnosis present

## 2019-04-27 DIAGNOSIS — D649 Anemia, unspecified: Secondary | ICD-10-CM | POA: Diagnosis not present

## 2019-04-27 DIAGNOSIS — N184 Chronic kidney disease, stage 4 (severe): Secondary | ICD-10-CM | POA: Diagnosis present

## 2019-04-27 DIAGNOSIS — E782 Mixed hyperlipidemia: Secondary | ICD-10-CM | POA: Diagnosis present

## 2019-04-27 DIAGNOSIS — E1122 Type 2 diabetes mellitus with diabetic chronic kidney disease: Secondary | ICD-10-CM | POA: Diagnosis present

## 2019-04-27 DIAGNOSIS — E1165 Type 2 diabetes mellitus with hyperglycemia: Secondary | ICD-10-CM | POA: Diagnosis not present

## 2019-04-27 DIAGNOSIS — K635 Polyp of colon: Secondary | ICD-10-CM | POA: Diagnosis present

## 2019-04-27 DIAGNOSIS — Z20822 Contact with and (suspected) exposure to covid-19: Secondary | ICD-10-CM | POA: Diagnosis present

## 2019-04-27 DIAGNOSIS — D62 Acute posthemorrhagic anemia: Secondary | ICD-10-CM | POA: Diagnosis present

## 2019-04-27 DIAGNOSIS — N400 Enlarged prostate without lower urinary tract symptoms: Secondary | ICD-10-CM | POA: Diagnosis present

## 2019-04-27 DIAGNOSIS — N179 Acute kidney failure, unspecified: Secondary | ICD-10-CM

## 2019-04-27 DIAGNOSIS — N189 Chronic kidney disease, unspecified: Secondary | ICD-10-CM | POA: Diagnosis present

## 2019-04-27 DIAGNOSIS — Z87891 Personal history of nicotine dependence: Secondary | ICD-10-CM | POA: Diagnosis not present

## 2019-04-27 DIAGNOSIS — K922 Gastrointestinal hemorrhage, unspecified: Principal | ICD-10-CM | POA: Diagnosis present

## 2019-04-27 DIAGNOSIS — E119 Type 2 diabetes mellitus without complications: Secondary | ICD-10-CM

## 2019-04-27 DIAGNOSIS — Z79899 Other long term (current) drug therapy: Secondary | ICD-10-CM

## 2019-04-27 DIAGNOSIS — D500 Iron deficiency anemia secondary to blood loss (chronic): Secondary | ICD-10-CM

## 2019-04-27 DIAGNOSIS — N183 Chronic kidney disease, stage 3 unspecified: Secondary | ICD-10-CM

## 2019-04-27 DIAGNOSIS — I129 Hypertensive chronic kidney disease with stage 1 through stage 4 chronic kidney disease, or unspecified chronic kidney disease: Secondary | ICD-10-CM | POA: Diagnosis present

## 2019-04-27 HISTORY — DX: Encounter for other specified aftercare: Z51.89

## 2019-04-27 HISTORY — DX: Type 2 diabetes mellitus without complications: E11.9

## 2019-04-27 LAB — CBC
HCT: 18.5 % — ABNORMAL LOW (ref 39.0–52.0)
HCT: 16.4 % — ABNORMAL LOW (ref 39.0–52.0)
HCT: 21.2 % — ABNORMAL LOW (ref 39.0–52.0)
Hemoglobin: 5.5 g/dL — ABNORMAL LOW (ref 13.0–17.0)
Hemoglobin: 5 g/dL — ABNORMAL LOW (ref 13.0–17.0)
Hemoglobin: 6.6 g/dL — ABNORMAL LOW (ref 13.0–17.0)
MCH: 26.1 pg (ref 26.0–34.0)
MCH: 26.5 pg (ref 26.0–34.0)
MCH: 26.6 pg (ref 26.0–34.0)
MCHC: 29.7 g/dL — ABNORMAL LOW (ref 30.0–36.0)
MCHC: 30.5 g/dL (ref 30.0–36.0)
MCHC: 31.1 g/dL (ref 30.0–36.0)
MCV: 87.7 fL (ref 80.0–100.0)
MCV: 86.8 fL (ref 80.0–100.0)
MCV: 85.5 fL (ref 80.0–100.0)
Platelets: 198 10*3/uL (ref 150–400)
Platelets: 191 10*3/uL (ref 150–400)
Platelets: 191 10*3/uL (ref 150–400)
RBC: 2.11 MIL/uL — ABNORMAL LOW (ref 4.22–5.81)
RBC: 1.89 MIL/uL — ABNORMAL LOW (ref 4.22–5.81)
RBC: 2.48 MIL/uL — ABNORMAL LOW (ref 4.22–5.81)
RDW: 14.9 % (ref 11.5–15.5)
RDW: 14.7 % (ref 11.5–15.5)
RDW: 14.5 % (ref 11.5–15.5)
WBC: 4.1 10*3/uL (ref 4.0–10.5)
WBC: 3.8 10*3/uL — ABNORMAL LOW (ref 4.0–10.5)
WBC: 5 10*3/uL (ref 4.0–10.5)
nRBC: 0.5 % — ABNORMAL HIGH (ref 0.0–0.2)
nRBC: 0.5 % — ABNORMAL HIGH (ref 0.0–0.2)
nRBC: 0.4 % — ABNORMAL HIGH (ref 0.0–0.2)

## 2019-04-27 LAB — URINALYSIS, ROUTINE W REFLEX MICROSCOPIC
Bacteria, UA: NONE SEEN
Bilirubin Urine: NEGATIVE
Glucose, UA: NEGATIVE mg/dL
Hgb urine dipstick: NEGATIVE
Ketones, ur: NEGATIVE mg/dL
Leukocytes,Ua: NEGATIVE
Nitrite: NEGATIVE
Protein, ur: >=300 mg/dL — AB
Specific Gravity, Urine: 1.011 (ref 1.005–1.030)
Squamous Epithelial / HPF: NONE SEEN (ref 0–5)
pH: 6 (ref 5.0–8.0)

## 2019-04-27 LAB — GLUCOSE, CAPILLARY
Glucose-Capillary: 100 mg/dL — ABNORMAL HIGH (ref 70–99)
Glucose-Capillary: 84 mg/dL (ref 70–99)

## 2019-04-27 LAB — COMPREHENSIVE METABOLIC PANEL
ALT: 13 U/L (ref 0–44)
AST: 17 U/L (ref 15–41)
Albumin: 3.1 g/dL — ABNORMAL LOW (ref 3.5–5.0)
Alkaline Phosphatase: 19 U/L — ABNORMAL LOW (ref 38–126)
Anion gap: 7 (ref 5–15)
BUN: 39 mg/dL — ABNORMAL HIGH (ref 8–23)
CO2: 24 mmol/L (ref 22–32)
Calcium: 8.4 mg/dL — ABNORMAL LOW (ref 8.9–10.3)
Chloride: 112 mmol/L — ABNORMAL HIGH (ref 98–111)
Creatinine, Ser: 2.86 mg/dL — ABNORMAL HIGH (ref 0.61–1.24)
GFR calc Af Amer: 24 mL/min — ABNORMAL LOW (ref >60)
GFR calc non Af Amer: 20 mL/min — ABNORMAL LOW (ref >60)
Glucose, Bld: 135 mg/dL — ABNORMAL HIGH (ref 70–99)
Potassium: 4.2 mmol/L (ref 3.5–5.1)
Sodium: 143 mmol/L (ref 135–145)
Total Bilirubin: 0.5 mg/dL (ref 0.3–1.2)
Total Protein: 5.9 g/dL — ABNORMAL LOW (ref 6.5–8.1)

## 2019-04-27 LAB — RESPIRATORY PANEL BY RT PCR (FLU A&B, COVID)
Influenza A by PCR: NEGATIVE
Influenza B by PCR: NEGATIVE
SARS Coronavirus 2 by RT PCR: NEGATIVE

## 2019-04-27 LAB — CREATININE, URINE, RANDOM: Creatinine, Urine: 93 mg/dL

## 2019-04-27 LAB — PREPARE RBC (CROSSMATCH)

## 2019-04-27 LAB — IRON AND TIBC
Iron: 23 ug/dL — ABNORMAL LOW (ref 45–182)
Saturation Ratios: 6 % — ABNORMAL LOW (ref 17.9–39.5)
TIBC: 417 ug/dL (ref 250–450)
UIBC: 394 ug/dL

## 2019-04-27 LAB — HEMOGLOBIN A1C
Hgb A1c MFr Bld: 5.8 % — ABNORMAL HIGH (ref 4.8–5.6)
Mean Plasma Glucose: 119.76 mg/dL

## 2019-04-27 LAB — SODIUM, URINE, RANDOM: Sodium, Ur: 116 mmol/L

## 2019-04-27 LAB — ABO/RH: ABO/RH(D): AB POS

## 2019-04-27 LAB — FERRITIN: Ferritin: 5 ng/mL — ABNORMAL LOW (ref 24–336)

## 2019-04-27 LAB — VITAMIN B12: Vitamin B-12: 118 pg/mL — ABNORMAL LOW (ref 180–914)

## 2019-04-27 MED ORDER — PANTOPRAZOLE SODIUM 40 MG IV SOLR
40.0000 mg | Freq: Two times a day (BID) | INTRAVENOUS | Status: DC
  Administered 2019-04-27 – 2019-04-29 (×5): 40 mg via INTRAVENOUS
  Filled 2019-04-27 (×5): qty 40

## 2019-04-27 MED ORDER — INSULIN ASPART 100 UNIT/ML ~~LOC~~ SOLN: 0.0000 [IU] | SUBCUTANEOUS | Status: DC

## 2019-04-27 MED ORDER — ONDANSETRON HCL 4 MG PO TABS: 4.0000 mg | ORAL_TABLET | Freq: Four times a day (QID) | ORAL | Status: DC | PRN

## 2019-04-27 MED ORDER — ONDANSETRON HCL 4 MG/2ML IJ SOLN: 4.0000 mg | Freq: Four times a day (QID) | INTRAMUSCULAR | Status: DC | PRN

## 2019-04-27 MED ORDER — TERAZOSIN HCL 5 MG PO CAPS
10.0000 mg | ORAL_CAPSULE | Freq: Every day | ORAL | Status: DC
  Administered 2019-04-28 (×2): 10 mg via ORAL
  Filled 2019-04-27 (×4): qty 2

## 2019-04-27 MED ORDER — INSULIN GLARGINE 100 UNIT/ML ~~LOC~~ SOLN
30.0000 [IU] | Freq: Every day | SUBCUTANEOUS | Status: DC
  Administered 2019-04-27: 30 [IU] via SUBCUTANEOUS
  Filled 2019-04-27 (×2): qty 0.3

## 2019-04-27 MED ORDER — ACETAMINOPHEN 650 MG RE SUPP: 650.0000 mg | Freq: Four times a day (QID) | RECTAL | Status: DC | PRN

## 2019-04-27 MED ORDER — POLYETHYLENE GLYCOL 3350 17 G PO PACK: 17.0000 g | PACK | Freq: Every day | ORAL | Status: DC | PRN

## 2019-04-27 MED ORDER — SODIUM CHLORIDE 0.9 % IV SOLN
10.0000 mL/h | Freq: Once | INTRAVENOUS | Status: AC
  Administered 2019-04-27: 10 mL/h via INTRAVENOUS

## 2019-04-27 MED ORDER — CARVEDILOL 6.25 MG PO TABS
6.2500 mg | ORAL_TABLET | Freq: Two times a day (BID) | ORAL | Status: DC
  Administered 2019-04-27 – 2019-04-29 (×4): 6.25 mg via ORAL
  Filled 2019-04-27 (×4): qty 1

## 2019-04-27 MED ORDER — FENOFIBRATE 160 MG PO TABS
160.0000 mg | ORAL_TABLET | Freq: Every day | ORAL | Status: DC
  Administered 2019-04-27 – 2019-04-28 (×2): 160 mg via ORAL
  Filled 2019-04-27 (×4): qty 1

## 2019-04-27 MED ORDER — ISOSORBIDE MONONITRATE ER 30 MG PO TB24
30.0000 mg | ORAL_TABLET | Freq: Every day | ORAL | Status: DC
  Administered 2019-04-28 – 2019-04-29 (×2): 30 mg via ORAL
  Filled 2019-04-27 (×2): qty 1

## 2019-04-27 MED ORDER — HYDRALAZINE HCL 25 MG PO TABS
25.0000 mg | ORAL_TABLET | Freq: Two times a day (BID) | ORAL | Status: DC
  Administered 2019-04-27 – 2019-04-29 (×5): 25 mg via ORAL
  Filled 2019-04-27 (×5): qty 1

## 2019-04-27 MED ORDER — ACETAMINOPHEN 325 MG PO TABS: 650.0000 mg | ORAL_TABLET | Freq: Four times a day (QID) | ORAL | Status: DC | PRN

## 2019-04-27 NOTE — ED Notes (Signed)
Pt ambulatory to toilet with this RN at bedside. Pt voided. Pt safely back on bed, monitor on, call bell at bedside within arms reach. This RN will continue to monitor pt.

## 2019-04-27 NOTE — ED Notes (Signed)
Pt denies CP/SHOB at this time.

## 2019-04-27 NOTE — H&P (Signed)
TRH H&P   Patient Demographics:    Gregory Dixon, is a 79 y.o. male  MRN: [id number]   DOB - 1941/01/13  Admit Date - 04/27/2019  Outpatient Primary MD for the patient is [place]  Referring MD: Dr. Kerman Passey  Outpatient Specialists: None  Patient coming from: Home  Chief Complaint  Patient presents with  . Abnormal Lab      HPI:    Gregory Dixon  is a 79 y.o. 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  Hemorrhoidal bleeding about 7 years back s/p banding, unspecified anemia for which she was seen by hematologist (Dr. Grayland Ormond) suspected to be due to marrow suppression. Patient presented to his PCP with dizziness and near syncopal episodes for past few days.  He has been been noticing bright red blood in the toilet bowl and on wiping his buttocks after defecation.  Reports feeling short of breath on exertion.  Denies hematemesis or melanotic stool.  Denies constipation or straining.  Denies rectal trauma, use of NSAIDs, smoking or alcohol use.  No recent change in his medication.  Denies any headache, blurred vision, nausea, vomiting, chest pain, orthopnea, PND, palpitations, abdominal pain, diarrhea, dysuria or hematuria.  Reports feeling weak overall but denies any tingling or numbness.  Denies loss of consciousness or fall. He reports having colonoscopy almost 10 years back in [place] which he says was normal.  Denies any sick contact, recent illness or travel.  Patient's hemoglobin at PCP office was 5.2 and was sent to the ED.  Course in the ED Vitals were stable.  Blood work showed hemoglobin of 5.5, normal WBC and platelets.  Chemistry showed BUN of 39 and creatinine of  2.86 (baseline over past 1 year appears to be 1.8-2). Rectal exam done by ED physician showed stool to be mildly positive for guaiac. Order for 2 unit PRBC and hospitalist consulted for admission.        Review of systems:    In addition to the HPI above,  No Fever-chills, No Headache, No changes with Vision or hearing, No problems swallowing food or Liquids, No Chest pain, Cough, shortness of breath with exertion + No Abdominal pain, No Nausea or vomiting, Bowel movements are regular, Bright red blood mixed with stool+++, no dysuria or hematuria Dizziness and generalized weakness+++, near syncope+ No new skin rashes or bruises, No new joints pains-aches,  No  numbness in any extremity, No recent weight gain or loss, No polyuria, polydypsia or polyphagia, No significant Mental Stressors.   With Past History of the following :    Past Medical History:  Diagnosis Date  . Blood transfusion without reported diagnosis   . Diabetes mellitus without complication (  HCC)   BPH, Chronic kidney disease stage III Essential hypertension  Past surgical history: Banding of hemorrhoids   Social History:     Social History   Tobacco Use  . Smoking status: Former Research scientist (life sciences)  . Smokeless tobacco: Never Used  Substance Use Topics  . Alcohol use: Not Currently     Lives -Home with wife  Mobility -independent     Family History :   No family history of heart disease   Home Medications:   Prior to Admission medications   Not on File     Allergies:    No Known Allergies   Physical Exam:   Vitals  Blood pressure (!) 158/63, pulse 74, temperature 98.7 F (37.1 C), temperature source Oral, resp. rate 18, height 6\' 6"  (1.981 m), weight 120.2 kg, SpO2 100 %.   General: Elderly male lying in bed in no acute distress HEENT: Pupils reactive bilaterally, EOMI, pallor present, no icterus, moist mucosa, supple neck, no cervical lymphadenopathy Chest: Clear to auscultation  bilaterally CVs: Normal S1-S2, no murmurs rub or gallop GI: Soft, nondistended, nontender, bowel sounds present  musculoskeletal: Warm, trace edema CNS: Alert and oriented, nonfocal   Data Review:    CBC Recent Labs  Lab 04/27/19 1149  WBC 4.1  HGB 5.5*  HCT 18.5*  PLT 198  MCV 87.7  MCH 26.1  MCHC 29.7*  RDW 14.9   ------------------------------------------------------------------------------------------------------------------  Chemistries  Recent Labs  Lab 04/27/19 1149  NA 143  K 4.2  CL 112*  CO2 24  GLUCOSE 135*  BUN 39*  CREATININE 2.86*  CALCIUM 8.4*  AST 17  ALT 13  ALKPHOS 19*  BILITOT 0.5   ------------------------------------------------------------------------------------------------------------------ estimated creatinine clearance is 31.5 mL/min (A) (by C-G formula based on SCr of 2.86 mg/dL (H)). ------------------------------------------------------------------------------------------------------------------ No results for input(s): TSH, T4TOTAL, T3FREE, THYROIDAB in the last 72 hours.  Invalid input(s): FREET3  Coagulation profile No results for input(s): INR, PROTIME in the last 168 hours. ------------------------------------------------------------------------------------------------------------------- No results for input(s): DDIMER in the last 72 hours. -------------------------------------------------------------------------------------------------------------------  Cardiac Enzymes No results for input(s): CKMB, TROPONINI, MYOGLOBIN in the last 168 hours.  Invalid input(s): CK ------------------------------------------------------------------------------------------------------------------ No results found for: BNP   ---------------------------------------------------------------------------------------------------------------  Urinalysis No results found for: COLORURINE, APPEARANCEUR, LABSPEC, [place], GLUCOSEU, HGBUR,  BILIRUBINUR, KETONESUR, PROTEINUR, UROBILINOGEN, NITRITE, LEUKOCYTESUR  ----------------------------------------------------------------------------------------------------------------   Imaging Results:    No results found.  My personal review of EKG: Normal sinus rhythm at 80, no ST-T changes  Assessment & Plan:    Active Problems:   Acute lower GI bleeding   Acute blood loss anemia Source unclear, suspect lower GI bleed.  Could be hemorrhoidal given prior history. Admit to medical floor on telemetry.  2 unit PRBC ordered for transfusion.  (Recent baseline hemoglobin not known and not available on Care Everywhere). Monitor serial H&H every 6 hours.  IV Protonix every 12 hours.  Check iron panel, B12 and folate. Keep n.p.o.  GI consulted for evaluation.     Diabetes mellitus with hyperglycemia, with long-term current use of insulin (HCC) Monitor on every 4 sliding scale coverage.  Patient on NPH 55 units twice daily.  Given n.p.o. status and acute on chronic kidney disease will place on low-dose of Lantus (30 units at bedtime).  A1c over the past 1 year seems stable in the 60s.    Acute renal failure superimposed on stage 3 chronic kidney disease (HCC) Baseline creatinine over the past 2 years in the range of 1.7-2.  Possibly prerenal  with acute blood loss.  Avoid nephrotoxins.  Hold HCTZ/losartan.  Check UA, urine lites.  Gentle IV hydration and monitor.    Essential hypertension Blood pressure stable but patient could be orthostatic with acute blood loss anemia given symptoms of near syncope.  Will reduce Coreg and hydralazine dose.  Holding HCTZ and losartan given AKI.  BPH Continue Terazosin  Mixed hyperlipidemia Continue fibrate  COVID-19 PCR swab ordered.  DVT Prophylaxis SCD  AM Labs Ordered, also please review Full Orders  Family Communication: Admission, patients condition and plan of care including tests being ordered have been discussed with the patient at  bedside  Code Status full code  Likely DC to home once clinically improved likely in the next 48 hours  Condition: [place] called: GI (Dr. Vicente Males)  Admission status: Inpatient Patient presenting with severe anemia secondary to acute blood loss with associated dizziness and near syncope.  He also has acute on chronic kidney disease stage III.  Patient needs blood transfusion and serial monitoring of his H&H with high risk for hemodynamic compromise due to active blood loss anemia.  He also needs IV fluids and close monitoring of his renal function with high risk for further worsening of his acute kidney injury.  For this patient needs to be monitored in an inpatient setting for at least >2 midnight.  Time spent in minutes : 60   Daylyn Azbill M.D on 04/27/2019 at 1:55 PM  Between 7am to 7pm - Pager - [phone]. After 7pm go to [email or web address] - password [facility]  [facility] - Office  [phone]

## 2019-04-27 NOTE — ED Notes (Signed)
Pt st "brigth red blood" for 1x month only when "I wipe". MD Dhungel at bedside.

## 2019-04-27 NOTE — ED Notes (Signed)
Pt feeling dizzy and SOB on exertion for a week. Pt A/Ox4.

## 2019-04-27 NOTE — ED Provider Notes (Signed)
[facility] Emergency Department Provider Note  Time seen: 12:24 PM  I have reviewed the triage vital signs and the nursing notes.   HISTORY  Chief Complaint Abnormal Lab   HPI Gregory Dixon is a 79 y.o. male with a past medical history of prior GI bleed/hemorrhoidal bleeding, diabetes, presents to the emergency department for generalized weakness shortness of breath found to have a hemoglobin of 5.2 at his PCP and sent to the ER.  Patient states a history of blood transfusions in the past secondary to significant hemorrhoidal bleeding, states status post hemorrhoidal banding procedure for the bleeding.  Denies any abdominal pain denies any nausea vomiting or diarrhea.  The patient has been experiencing blood when he was defecating over the past several weeks, states the last significant bleeding was approximately 1 week ago.  Denies any fever cough.   Past Medical History:  Diagnosis Date  . Blood transfusion without reported diagnosis   . Diabetes mellitus without complication ([place])     There are no problems to display for this patient.   History reviewed. No pertinent surgical history.  Prior to Admission medications   Not on File    No Known Allergies  No family history on file.  Social History Social History   Tobacco Use  . Smoking status: Former Research scientist (life sciences)  . Smokeless tobacco: Never Used  Substance Use Topics  . Alcohol use: Not Currently  . Drug use: Never    Review of Systems Constitutional: Negative for fever Cardiovascular: Negative for chest pain. Respiratory: Negative for shortness of breath. Gastrointestinal: Negative for abdominal pain.  Positive for intermittent rectal bleeding Genitourinary: Negative for urinary compaints Musculoskeletal: Negative for musculoskeletal complaints Neurological: Negative for headache All other ROS negative  ____________________________________________   PHYSICAL EXAM:  VITAL SIGNS: ED  Triage Vitals  Enc Vitals Group     BP 04/27/19 1141 117/84     Pulse Rate 04/27/19 1141 79     Resp 04/27/19 1141 16     Temp 04/27/19 1141 98.7 F (37.1 C)     Temp Source 04/27/19 1141 Oral     SpO2 04/27/19 1140 98 %     Weight 04/27/19 1142 265 lb (120.2 kg)     Height 04/27/19 1142 6\' 6"  (1.981 m)     Head Circumference --      Peak Flow --      Pain Score 04/27/19 1142 0     Pain Loc --      Pain Edu? --      Excl. in [place]? --    Constitutional: Alert and oriented. Well appearing and in no distress. Eyes: Normal exam ENT      Head: Normocephalic and atraumatic.      Mouth/Throat: Mucous membranes are moist. Cardiovascular: Normal rate, regular rhythm Respiratory: Normal respiratory effort without tachypnea nor retractions. Breath sounds are clear Gastrointestinal: Soft and nontender. No distention.   Musculoskeletal: Nontender with normal range of motion in all extremities.  Neurologic:  Normal speech and language. No gross focal neurologic deficits Skin:  Skin is warm, dry and intact.  Pale in appearance.   Psychiatric: Mood and affect are normal.   ____________________________________________    EKG  EKG viewed and interpreted by myself shows a normal sinus rhythm at 80 bpm with a narrow QRS, normal axis, normal intervals, no concerning ST changes.  ____________________________________________   INITIAL IMPRESSION / ASSESSMENT AND PLAN / ED COURSE  Pertinent labs & imaging results that were available  during my care of the patient were reviewed by me and considered in my medical decision making (see chart for details).   Patient presents to the emergency department for fatigue weakness shortness of breath with exertion found him hemoglobin of 5.2 by PCP.  Patient's rectal exam shows light brown stool, mild guaiac positive.  Patient does have significant external hemorrhoids that are currently decompressed and no active bleeding at this time.  Patient has a benign  abdominal exam.  Overall the patient appears well.  We will recheck labs.  Patient is pale in appearance anticipate significant anemia on our repeat blood work.  We will likely transfuse for which I have already consented the patient verbally.  If requiring transfusion patient will be admitted to the hospital today for further work-up and likely GI consultation.  Patient's repeat hemoglobin in the emergency department is 5.5.  We will order 2 units of packed red blood cells for the patient and admit to the hospitalist service.  Fuquan Wilson was evaluated in Emergency Department on 04/27/2019 for the symptoms described in the history of present illness. He was evaluated in the context of the global COVID-19 pandemic, which necessitated consideration that the patient might be at risk for infection with the SARS-CoV-2 virus that causes COVID-19. Institutional protocols and algorithms that pertain to the evaluation of patients at risk for COVID-19 are in a state of rapid change based on information released by regulatory bodies including the CDC and federal and state organizations. These policies and algorithms were followed during the patient's care in the ED.  CRITICAL CARE Performed by: Harvest Dark   Total critical care time: 30 minutes  Critical care time was exclusive of separately billable procedures and treating other patients.  Critical care was necessary to treat or prevent imminent or life-threatening deterioration.  Critical care was time spent personally by me on the following activities: development of treatment plan with patient and/or surrogate as well as nursing, discussions with consultants, evaluation of patient's response to treatment, examination of patient, obtaining history from patient or surrogate, ordering and performing treatments and interventions, ordering and review of laboratory studies, ordering and review of radiographic studies, pulse oximetry and re-evaluation of  patient's condition.  ____________________________________________   FINAL CLINICAL IMPRESSION(S) / ED DIAGNOSES  GI bleed Symptomatic anemia   Harvest Dark, MD 04/27/19 1228

## 2019-04-27 NOTE — ED Notes (Signed)
ED Provider Paduchowski  at bedside at this time.

## 2019-04-27 NOTE — ED Notes (Signed)
ED Provider Dhungel at bedside.

## 2019-04-27 NOTE — ED Notes (Signed)
This RN at bedside, pt speaking in complete and clear sentences. A/Ox4.

## 2019-04-27 NOTE — ED Triage Notes (Signed)
Pt in via POV, sent over from PCP office, Hgb 5.2.  Pt reports chronic dizziness and shortness of breath which has worsened over the last week, developing into near syncopal episodes.    Pt reports hx of need transfusion years ago due to bleeding from hemorrhoids.  When asked about current bloody stools, states, "over the last month."  Pt denies seeing PCP until today, states, "I will normally have an episode and then it will clear up."  Pt pale in appearance, vitals WDL at this time.

## 2019-04-28 DIAGNOSIS — N184 Chronic kidney disease, stage 4 (severe): Secondary | ICD-10-CM

## 2019-04-28 DIAGNOSIS — D500 Iron deficiency anemia secondary to blood loss (chronic): Secondary | ICD-10-CM

## 2019-04-28 DIAGNOSIS — D519 Vitamin B12 deficiency anemia, unspecified: Secondary | ICD-10-CM

## 2019-04-28 DIAGNOSIS — N400 Enlarged prostate without lower urinary tract symptoms: Secondary | ICD-10-CM

## 2019-04-28 DIAGNOSIS — E1122 Type 2 diabetes mellitus with diabetic chronic kidney disease: Secondary | ICD-10-CM

## 2019-04-28 LAB — CBC
HCT: 20 % — ABNORMAL LOW (ref 39.0–52.0)
HCT: 22.6 % — ABNORMAL LOW (ref 39.0–52.0)
Hemoglobin: 6.2 g/dL — ABNORMAL LOW (ref 13.0–17.0)
Hemoglobin: 7 g/dL — ABNORMAL LOW (ref 13.0–17.0)
MCH: 26.5 pg (ref 26.0–34.0)
MCH: 26.4 pg (ref 26.0–34.0)
MCHC: 31 g/dL (ref 30.0–36.0)
MCHC: 31 g/dL (ref 30.0–36.0)
MCV: 85.5 fL (ref 80.0–100.0)
MCV: 85.3 fL (ref 80.0–100.0)
Platelets: 193 10*3/uL (ref 150–400)
Platelets: 195 10*3/uL (ref 150–400)
RBC: 2.34 MIL/uL — ABNORMAL LOW (ref 4.22–5.81)
RBC: 2.65 MIL/uL — ABNORMAL LOW (ref 4.22–5.81)
RDW: 14.6 % (ref 11.5–15.5)
RDW: 14.6 % (ref 11.5–15.5)
WBC: 4.7 10*3/uL (ref 4.0–10.5)
WBC: 4.8 10*3/uL (ref 4.0–10.5)
nRBC: 0 % (ref 0.0–0.2)
nRBC: 0.6 % — ABNORMAL HIGH (ref 0.0–0.2)

## 2019-04-28 LAB — BASIC METABOLIC PANEL
Anion gap: 7 (ref 5–15)
BUN: 35 mg/dL — ABNORMAL HIGH (ref 8–23)
CO2: 23 mmol/L (ref 22–32)
Calcium: 8.2 mg/dL — ABNORMAL LOW (ref 8.9–10.3)
Chloride: 113 mmol/L — ABNORMAL HIGH (ref 98–111)
Creatinine, Ser: 2.8 mg/dL — ABNORMAL HIGH (ref 0.61–1.24)
GFR calc Af Amer: 24 mL/min — ABNORMAL LOW (ref >60)
GFR calc non Af Amer: 21 mL/min — ABNORMAL LOW (ref >60)
Glucose, Bld: 83 mg/dL (ref 70–99)
Potassium: 4.1 mmol/L (ref 3.5–5.1)
Sodium: 143 mmol/L (ref 135–145)

## 2019-04-28 LAB — GLUCOSE, CAPILLARY
Glucose-Capillary: 65 mg/dL — ABNORMAL LOW (ref 70–99)
Glucose-Capillary: 74 mg/dL (ref 70–99)
Glucose-Capillary: 79 mg/dL (ref 70–99)
Glucose-Capillary: 89 mg/dL (ref 70–99)
Glucose-Capillary: 92 mg/dL (ref 70–99)
Glucose-Capillary: 96 mg/dL (ref 70–99)

## 2019-04-28 LAB — PREPARE RBC (CROSSMATCH)

## 2019-04-28 MED ORDER — ACETAMINOPHEN 325 MG PO TABS
650.0000 mg | ORAL_TABLET | Freq: Once | ORAL | Status: AC
  Administered 2019-04-28: 650 mg via ORAL
  Filled 2019-04-28: qty 2

## 2019-04-28 MED ORDER — CYANOCOBALAMIN 1000 MCG/ML IJ SOLN
1000.0000 ug | Freq: Every day | INTRAMUSCULAR | Status: DC
  Administered 2019-04-28 – 2019-04-29 (×2): 1000 ug via INTRAMUSCULAR
  Filled 2019-04-28 (×2): qty 1

## 2019-04-28 MED ORDER — SODIUM CHLORIDE 0.9% IV SOLUTION: Freq: Once | INTRAVENOUS | Status: AC

## 2019-04-28 MED ORDER — INSULIN GLARGINE 100 UNIT/ML ~~LOC~~ SOLN
10.0000 [IU] | Freq: Every day | SUBCUTANEOUS | Status: DC
  Administered 2019-04-28: 10 [IU] via SUBCUTANEOUS
  Filled 2019-04-28: qty 0.1

## 2019-04-28 MED ORDER — FUROSEMIDE 10 MG/ML IJ SOLN
80.0000 mg | Freq: Once | INTRAMUSCULAR | Status: AC
  Administered 2019-04-28: 80 mg via INTRAVENOUS
  Filled 2019-04-28: qty 8

## 2019-04-28 MED ORDER — PEG 3350-KCL-NA BICARB-NACL 420 G PO SOLR
4000.0000 mL | Freq: Once | ORAL | Status: AC
  Administered 2019-04-28: 4000 mL via ORAL
  Filled 2019-04-28: qty 4000

## 2019-04-28 MED ORDER — SODIUM CHLORIDE 0.9 % IV SOLN
INTRAVENOUS | Status: DC
  Administered 2019-04-29 (×2): 1000 mL via INTRAVENOUS

## 2019-04-28 MED ORDER — SODIUM CHLORIDE 0.9 % IV SOLN
400.0000 mg | Freq: Once | INTRAVENOUS | Status: AC
  Administered 2019-04-28: 400 mg via INTRAVENOUS
  Filled 2019-04-28: qty 20

## 2019-04-28 NOTE — Progress Notes (Signed)
Patient ID: Gregory Dixon, male   DOB: 10/14/1940, 79 y.o.   MRN: [id number] [facility] PROGRESS NOTE  Gregory Dixon OZH:[id number] DOB: 1940-06-13 DOA: 04/27/2019 PCP: [facility]  HPI/Subjective: Patient states that he has hemorrhoids.  Similar thing happened to 9 years ago where he required iron infusions.  Patient states he feels okay.  No chest pain or shortness of breath.  Objective: Vitals:   04/28/19 0914 04/28/19 0950  BP: (!) 157/70 (!) 156/71  Pulse: 67 71  Resp: 18 18  Temp: 98.3 F (36.8 C) 98.2 F (36.8 C)  SpO2: 97% 97%    Intake/Output Summary (Last 24 hours) at 04/28/2019 1208 Last data filed at 04/28/2019 1148 Gross per 24 hour  Intake 1095 ml  Output 775 ml  Net 320 ml   Filed Weights   04/27/19 1142 04/27/19 1802 04/28/19 0329  Weight: 120.2 kg 120.1 kg 117.9 kg    ROS: Review of Systems  Constitutional: Negative for chills and fever.  Eyes: Negative for blurred vision.  Respiratory: Negative for cough and shortness of breath.   Cardiovascular: Negative for chest pain.  Gastrointestinal: Positive for blood in stool. Negative for abdominal pain, constipation, diarrhea, nausea and vomiting.  Genitourinary: Negative for dysuria.  Musculoskeletal: Negative for joint pain.  Neurological: Negative for dizziness and headaches.   Exam: Physical Exam  Constitutional: He is oriented to person, place, and time.  HENT:  Nose: No mucosal edema.  Mouth/Throat: No oropharyngeal exudate or posterior oropharyngeal edema.  Eyes: Conjunctivae and lids are normal.  Neck: Carotid bruit is not present.  Cardiovascular: S1 normal and S2 normal. Exam reveals no gallop.  No murmur heard. Pulses:      Dorsalis pedis pulses are 2+ on the right side and 2+ on the left side.  Respiratory: No respiratory distress. He has decreased breath sounds in the right lower field and the left lower field. He has no wheezes. He has no rhonchi. He has no rales.  GI:  Soft. Bowel sounds are normal. There is no abdominal tenderness.  Musculoskeletal:     Right ankle: No swelling.     Left ankle: No swelling.  Lymphadenopathy:    He has no cervical adenopathy.  Neurological: He is alert and oriented to person, place, and time. No cranial nerve deficit.  Skin: Skin is warm. No rash noted. Nails show no clubbing.  Psychiatric: He has a normal mood and affect.      Data Reviewed: Basic Metabolic Panel: Recent Labs  Lab 04/27/19 1149 04/28/19 0529  NA 143 143  K 4.2 4.1  CL 112* 113*  CO2 24 23  GLUCOSE 135* 83  BUN 39* 35*  CREATININE 2.86* 2.80*  CALCIUM 8.4* 8.2*   Liver Function Tests: Recent Labs  Lab 04/27/19 1149  AST 17  ALT 13  ALKPHOS 19*  BILITOT 0.5  PROT 5.9*  ALBUMIN 3.1*   CBC: Recent Labs  Lab 04/27/19 1149 04/27/19 1501 04/27/19 2245 04/28/19 0529 04/28/19 1153  WBC 4.1 3.8* 5.0 4.7 4.8  HGB 5.5* 5.0* 6.6* 6.2* 7.0*  HCT 18.5* 16.4* 21.2* 20.0* 22.6*  MCV 87.7 86.8 85.5 85.5 85.3  PLT 198 191 191 193 195    CBG: Recent Labs  Lab 04/27/19 2141 04/28/19 0053 04/28/19 0335 04/28/19 0801 04/28/19 1137  GLUCAP 84 65* 74 79 92    Recent Results (from the past 240 hour(s))  Respiratory Panel by RT PCR (Flu A&B, Covid) - Nasopharyngeal Swab  Status: None   Collection Time: 04/27/19  1:14 PM   Specimen: Nasopharyngeal Swab  Result Value Ref Range Status   SARS Coronavirus 2 by RT PCR NEGATIVE NEGATIVE Final    Comment: (NOTE) SARS-CoV-2 target nucleic acids are NOT DETECTED. The SARS-CoV-2 RNA is generally detectable in upper respiratoy specimens during the acute phase of infection. The lowest concentration of SARS-CoV-2 viral copies this assay can detect is 131 copies/mL. A negative result does not preclude SARS-Cov-2 infection and should not be used as the sole basis for treatment or other patient management decisions. A negative result may occur with  improper specimen collection/handling,  submission of specimen other than nasopharyngeal swab, presence of viral mutation(s) within the areas targeted by this assay, and inadequate number of viral copies (<131 copies/mL). A negative result must be combined with clinical observations, patient history, and epidemiological information. The expected result is Negative. Fact Sheet for Patients:  PinkCheek.be Fact Sheet for Healthcare Providers:  GravelBags.it This test is not yet ap proved or cleared by the [place] FDA and  has been authorized for detection and/or diagnosis of SARS-CoV-2 by FDA under an Emergency Use Authorization (EUA). This EUA will remain  in effect (meaning this test can be used) for the duration of the COVID-19 declaration under Section 564(b)(1) of the Act, 21 U.S.C. section 360bbb-3(b)(1), unless the authorization is terminated or revoked sooner.    Influenza A by PCR NEGATIVE NEGATIVE Final   Influenza B by PCR NEGATIVE NEGATIVE Final    Comment: (NOTE) The Xpert Xpress SARS-CoV-2/FLU/RSV assay is intended as an aid in  the diagnosis of influenza from Nasopharyngeal swab specimens and  should not be used as a sole basis for treatment. Nasal washings and  aspirates are unacceptable for Xpert Xpress SARS-CoV-2/FLU/RSV  testing. Fact Sheet for Patients: PinkCheek.be Fact Sheet for Healthcare Providers: GravelBags.it This test is not yet approved or cleared by the [place] FDA and  has been authorized for detection and/or diagnosis of SARS-CoV-2 by  FDA under an Emergency Use Authorization (EUA). This EUA will remain  in effect (meaning this test can be used) for the duration of the  Covid-19 declaration under Section 564(b)(1) of the Act, 21  U.S.C. section 360bbb-3(b)(1), unless the authorization is  terminated or revoked. Performed at [facility], [address] [place], [place] [address]       Scheduled Meds: . carvedilol  6.25 mg Oral BID WC  . cyanocobalamin  1,000 mcg Intramuscular Daily  . fenofibrate  160 mg Oral Daily  . hydrALAZINE  25 mg Oral BID  . insulin aspart  0-9 Units Subcutaneous Q4H  . insulin glargine  10 Units Subcutaneous QHS  . isosorbide mononitrate  30 mg Oral Daily  . pantoprazole (PROTONIX) IV  40 mg Intravenous Q12H  . terazosin  10 mg Oral QHS   Continuous Infusions: . iron sucrose      Assessment/Plan:  1. Chronic blood loss anemia.  Severe iron deficiency anemia and B12 deficiency.  Await GI opinion about endoscopy and colonoscopy.  Clear liquid diet for now.  IM B12 shots.  Patient receiving 3rd unit of blood today.  We will give IV iron later on this evening. 2. Essential hypertension on hydralazine and Coreg. 3. Type 2 diabetes mellitus with chronic kidney disease stage IV.  Decrease glargine insulin since likely will be n.p.o. tomorrow. 4. BPH on Terazosin  Code Status:     Code Status Orders  (From admission, onward)  Start     Ordered   04/27/19 1345  Full code  Continuous     04/27/19 1347        Code Status History    This patient has a current code status but no historical code status.   Advance Care Planning Activity    Advance Directive Documentation     Most Recent Value  Type of Advance Directive  Healthcare Power of Attorney, Living will  Pre-existing out of facility DNR order (yellow form or pink MOST form)  --  "MOST" Form in Place?  --     Family Communication: Spoke with wife on the phone Disposition Plan: Recheck hemoglobin tomorrow.  Gastroenterology to see if they want to do procedures while he is here.  Potential disposition tomorrow late afternoon  Consultants:  Gastroenterology  Time spent: 28 minutes  [place]

## 2019-04-28 NOTE — Consult Note (Signed)
Jonathon Bellows , MD [address], [place], [place], [place], [address] [address] [place], [place], [place], [address] Phone: [phone]  Fax: [phone]  Consultation  Referring Provider:  Dr Clementeen Graham Primary Care Physician:  [facility] Primary Gastroenterologist:  none         Reason for Consultation:    Rectal bleeding  Date of Admission:  04/27/2019 Date of Consultation:  04/28/2019         HPI:   Gregory Dixon is a 79 y.o. male presented to the hospital yesterday with abnormal labs.  He has a history of diabetes and CKD.  History of anemia previously attributed to bone marrow suppression.  History of bright red blood in the toilet bowl on wiping.  On admission hemoglobin was 5.5 with a BUN of 39 and a creatinine of 2.86.  B12 levels low at 112, ferritin 5.  Hemoglobin was 12.9 g in 2014.   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 EGD+colonoscopy about 9 years back .   Denies any NSAID use of abdominal pain.   Past Medical History:  Diagnosis Date  . Blood transfusion without reported diagnosis   . Diabetes mellitus without complication ([place])     History reviewed. No pertinent surgical history.  Prior to Admission medications   Medication Sig Start Date End Date Taking? Authorizing Provider  carvedilol (COREG) 25 MG tablet Take 25 mg by mouth 2 (two) times daily. 03/13/19  Yes [provider]  fenofibrate 160 MG tablet Take 160 mg by mouth daily. 03/13/19  Yes [provider]  hydrALAZINE (APRESOLINE) 50 MG tablet Take 100 mg by mouth 2 (two) times daily. 03/13/19  Yes [provider]  isosorbide mononitrate (IMDUR) 30 MG 24 hr tablet Take 30 mg by mouth every evening.  03/14/19  Yes [provider]  NOVOLOG MIX 70/30 FLEXPEN (70-30) 100 UNIT/ML FlexPen Inject 55 Units into the skin 2 (two) times daily. 03/13/19  Yes  [provider]  olmesartan-hydrochlorothiazide (BENICAR HCT) 40-25 MG tablet Take 1 tablet by mouth daily. 03/13/19  Yes [provider]  terazosin (HYTRIN) 10 MG capsule Take 10 mg by mouth every evening.  03/13/19  Yes [provider]    No family history on file.   Social History   Tobacco Use  . Smoking status: Former Research scientist (life sciences)  . Smokeless tobacco: Never Used  Substance Use Topics  . Alcohol use: Not Currently  . Drug use: Never    Allergies as of 04/27/2019 - Review Complete 04/27/2019  Allergen Reaction Noted  . Aspirin Other (See Comments) 10/20/2016  . Codeine Nausea Only and Nausea And Vomiting 02/20/2011    Review of Systems:    All systems reviewed and negative except where noted in HPI.   Physical Exam:  Vital signs in last 24 hours: Temp:  [97.4 F (36.3 C)-98.8 F (37.1 C)] 97.9 F (36.6 C) (01/21 0800) Pulse Rate:  [66-83] 71 (01/21 0800) Resp:  [14-25] 18 (01/21 0800) BP: (117-178)/(49-84) 153/64 (01/21 0800) SpO2:  [97 %-100 %] 97 % (01/21 0800) Weight:  [117.9 kg-120.2 kg] 117.9 kg (01/21 0329) Last BM Date: 04/27/19 General:   Pleasant, cooperative in NAD Head:  Normocephalic and atraumatic. Eyes:   No icterus.   Conjunctiva pink. PERRLA. Ears:  Normal auditory acuity. Neck:  Supple; no masses or thyroidomegaly Lungs: Respirations even and unlabored.  Lungs clear to auscultation bilaterally.   No wheezes, crackles, or rhonchi.  Heart:  Regular rate and rhythm;  Without murmur, clicks, rubs or gallops Abdomen:  Soft, nondistended, nontender. Normal bowel sounds. No appreciable masses or hepatomegaly.  No rebound or guarding.  Neurologic:  Alert and oriented x3;  grossly normal neurologically. Skin:  Intact without significant lesions or rashes. Cervical Nodes:  No significant cervical adenopathy. Psych:  Alert and cooperative. Normal affect.  LAB RESULTS: Recent Labs    04/27/19 1501 04/27/19 2245 04/28/19 0529  WBC  3.8* 5.0 4.7  HGB 5.0* 6.6* 6.2*  HCT 16.4* 21.2* 20.0*  PLT 191 191 193   BMET Recent Labs    04/27/19 1149 04/28/19 0529  NA 143 143  K 4.2 4.1  CL 112* 113*  CO2 24 23  GLUCOSE 135* 83  BUN 39* 35*  CREATININE 2.86* 2.80*  CALCIUM 8.4* 8.2*   LFT Recent Labs    04/27/19 1149  PROT 5.9*  ALBUMIN 3.1*  AST 17  ALT 13  ALKPHOS 19*  BILITOT 0.5   PT/INR No results for input(s): LABPROT, INR in the last 72 hours.  STUDIES: No results found.    Impression / Plan:   Gregory Dixon is a 79 y.o. y/o male admitted with severe anemia 5.5 g with a very low B12 and iron studies.H/o rectal bleeding for many years with bowel movemnets    Plan 1.  Replace B12 and IV iron.  Follow-up with cancer center as an outpatient for IV iron. 2.  EGD plus colonoscopy tomorrow  to evaluate for iron deficiency and B12 deficiency.  \  I have discussed alternative options, risks & benefits,  which include, but are not limited to, bleeding, infection, perforation,respiratory complication & drug reaction.  The patient agrees with this plan & written consent will be obtained.       LOS: 1 day   Jonathon Bellows, MD  04/28/2019, 9:07 AM

## 2019-04-29 ENCOUNTER — Encounter
Admission: EM | Disposition: A | Discharge status: Home / Self Care | Payer: Self-pay | Source: Home / Self Care | Attending: Internal Medicine

## 2019-04-29 ENCOUNTER — Inpatient Hospital Stay: Payer: Medicare Other | Admitting: Anesthesiology

## 2019-04-29 DIAGNOSIS — K635 Polyp of colon: Secondary | ICD-10-CM

## 2019-04-29 HISTORY — PX: ESOPHAGOGASTRODUODENOSCOPY (EGD) WITH PROPOFOL: SHX5813

## 2019-04-29 HISTORY — PX: COLONOSCOPY WITH PROPOFOL: SHX5780

## 2019-04-29 LAB — BASIC METABOLIC PANEL
Anion gap: 9 (ref 5–15)
BUN: 32 mg/dL — ABNORMAL HIGH (ref 8–23)
CO2: 25 mmol/L (ref 22–32)
Calcium: 8.4 mg/dL — ABNORMAL LOW (ref 8.9–10.3)
Chloride: 108 mmol/L (ref 98–111)
Creatinine, Ser: 3 mg/dL — ABNORMAL HIGH (ref 0.61–1.24)
GFR calc Af Amer: 22 mL/min — ABNORMAL LOW (ref >60)
GFR calc non Af Amer: 19 mL/min — ABNORMAL LOW (ref >60)
Glucose, Bld: 90 mg/dL (ref 70–99)
Potassium: 3.9 mmol/L (ref 3.5–5.1)
Sodium: 142 mmol/L (ref 135–145)

## 2019-04-29 LAB — FOLATE RBC
Folate, Hemolysate: 258 ng/mL
Folate, RBC: 1323 ng/mL (ref >498)
Hematocrit: 19.5 % — ABNORMAL LOW (ref 37.5–51.0)

## 2019-04-29 LAB — CBC
HCT: 23.7 % — ABNORMAL LOW (ref 39.0–52.0)
Hemoglobin: 7.4 g/dL — ABNORMAL LOW (ref 13.0–17.0)
MCH: 26.3 pg (ref 26.0–34.0)
MCHC: 31.2 g/dL (ref 30.0–36.0)
MCV: 84.3 fL (ref 80.0–100.0)
Platelets: 198 10*3/uL (ref 150–400)
RBC: 2.81 MIL/uL — ABNORMAL LOW (ref 4.22–5.81)
RDW: 14.5 % (ref 11.5–15.5)
WBC: 4.8 10*3/uL (ref 4.0–10.5)
nRBC: 0.4 % — ABNORMAL HIGH (ref 0.0–0.2)

## 2019-04-29 LAB — GLUCOSE, CAPILLARY
Glucose-Capillary: 100 mg/dL — ABNORMAL HIGH (ref 70–99)
Glucose-Capillary: 106 mg/dL — ABNORMAL HIGH (ref 70–99)
Glucose-Capillary: 81 mg/dL (ref 70–99)
Glucose-Capillary: 83 mg/dL (ref 70–99)
Glucose-Capillary: 94 mg/dL (ref 70–99)

## 2019-04-29 LAB — PREPARE RBC (CROSSMATCH)

## 2019-04-29 SURGERY — ESOPHAGOGASTRODUODENOSCOPY (EGD) WITH PROPOFOL
Anesthesia: General

## 2019-04-29 MED ORDER — VITAMIN B-12 1000 MCG PO TABS: 1000.0000 ug | ORAL_TABLET | Freq: Every day | ORAL | 0 refills | Status: DC

## 2019-04-29 MED ORDER — PROPOFOL 500 MG/50ML IV EMUL
INTRAVENOUS | Status: DC | PRN
  Administered 2019-04-29: 40 mg via INTRAVENOUS
  Administered 2019-04-29: 140 ug/kg/min via INTRAVENOUS

## 2019-04-29 MED ORDER — PHENYLEPHRINE HCL (PRESSORS) 10 MG/ML IV SOLN
INTRAVENOUS | Status: DC | PRN
  Administered 2019-04-29: 100 ug via INTRAVENOUS

## 2019-04-29 MED ORDER — SODIUM CHLORIDE 0.9 % IV SOLN: INTRAVENOUS | Status: DC

## 2019-04-29 MED ORDER — PROPOFOL 10 MG/ML IV BOLUS
INTRAVENOUS | Status: AC
  Filled 2019-04-29: qty 20

## 2019-04-29 MED ORDER — FUROSEMIDE 10 MG/ML IJ SOLN
40.0000 mg | Freq: Once | INTRAMUSCULAR | Status: AC
  Administered 2019-04-29: 40 mg via INTRAVENOUS
  Filled 2019-04-29: qty 4

## 2019-04-29 MED ORDER — NOVOLOG MIX 70/30 FLEXPEN (70-30) 100 UNIT/ML ~~LOC~~ SUPN: 10.0000 [IU] | PEN_INJECTOR | Freq: Two times a day (BID) | SUBCUTANEOUS | 11 refills | Status: DC

## 2019-04-29 MED ORDER — FUROSEMIDE 10 MG/ML IJ SOLN
20.0000 mg | Freq: Once | INTRAMUSCULAR | Status: AC
  Administered 2019-04-29: 20 mg via INTRAVENOUS
  Filled 2019-04-29: qty 2

## 2019-04-29 MED ORDER — CARVEDILOL 6.25 MG PO TABS: 6.2500 mg | ORAL_TABLET | Freq: Two times a day (BID) | ORAL | 0 refills | Status: DC

## 2019-04-29 MED ORDER — DEXTROSE 50 % IV SOLN
25.0000 g | Freq: Once | INTRAVENOUS | Status: AC
  Administered 2019-04-29: 25 g via INTRAVENOUS
  Filled 2019-04-29: qty 50

## 2019-04-29 MED ORDER — SODIUM CHLORIDE 0.9% IV SOLUTION: Freq: Once | INTRAVENOUS | Status: AC

## 2019-04-29 MED ORDER — HYDRALAZINE HCL 25 MG PO TABS: 25.0000 mg | ORAL_TABLET | Freq: Two times a day (BID) | ORAL | 0 refills | Status: DC

## 2019-04-29 MED ORDER — FERROUS SULFATE 325 (65 FE) MG PO TBEC: 325.0000 mg | DELAYED_RELEASE_TABLET | Freq: Two times a day (BID) | ORAL | 0 refills | Status: DC

## 2019-04-29 NOTE — Anesthesia Postprocedure Evaluation (Signed)
Anesthesia Post Note  Patient: Calistro Rauf  Procedure(s) Performed: ESOPHAGOGASTRODUODENOSCOPY (EGD) WITH PROPOFOL (N/A ) COLONOSCOPY WITH PROPOFOL (N/A )  Patient location during evaluation: Endoscopy Anesthesia Type: General Level of consciousness: awake and alert and oriented Pain management: pain level controlled Vital Signs Assessment: post-procedure vital signs reviewed and stable Respiratory status: spontaneous breathing, nonlabored ventilation and respiratory function stable Cardiovascular status: blood pressure returned to baseline and stable Postop Assessment: no signs of nausea or vomiting Anesthetic complications: no     Last Vitals:  Vitals:   04/29/19 1344 04/29/19 1404  BP: (!) 120/54 137/62  Pulse: 64 70  Resp: 13 15  Temp: 36.7 C   SpO2: 95% 96%    Last Pain:  Vitals:   04/29/19 1404  TempSrc:   PainSc: 0-No pain                 Shomari Matusik

## 2019-04-29 NOTE — Op Note (Signed)
[facility] Gastroenterology Patient Name: Gregory Dixon Procedure Date: 04/29/2019 12:42 PM MRN: [id number] Account #: [id number] Date of Birth: 04/12/1940 Admit Type: Inpatient Age: 79 Room: [facility] ENDO ROOM 3 Gender: Male Note Status: Finalized Procedure:             Colonoscopy Indications:           Iron deficiency anemia Providers:             Jonathon Bellows MD, MD Referring MD:          [facility] (Referring MD) Medicines:             Monitored Anesthesia Care Complications:         No immediate complications. Procedure:             Pre-Anesthesia Assessment:                        - Prior to the procedure, a History and Physical was                         performed, and patient medications, allergies and                         sensitivities were reviewed. The patient's tolerance                         of previous anesthesia was reviewed.                        - The risks and benefits of the procedure and the                         sedation options and risks were discussed with the                         patient. All questions were answered and informed                         consent was obtained.                        - ASA Grade Assessment: III - A patient with severe                         systemic disease.                        After obtaining informed consent, the colonoscope was                         passed under direct vision. Throughout the procedure,                         the patient's blood pressure, pulse, and oxygen                         saturations were monitored continuously. The                         Colonoscope was introduced through the anus and  advanced to the the cecum, identified by the                         appendiceal orifice. The colonoscopy was performed                         with ease. The patient tolerated the procedure well.                         The quality of the bowel  preparation was good. Findings:      The perianal and digital rectal examinations were normal.      A 15 mm polyp was found in the ascending colon proximal ascending colon.       The polyp was semi-sessile. Preparations were made for mucosal       resection. Eleview was injected to raise the lesion. Snare mucosal       resection was performed. Resection and retrieval were complete.      A 20 mm polyp was found in the proximal ascending colon. The polyp was       sessile. Preparations were made for mucosal resection. Eleview was       injected to raise the lesion. Snare mucosal resection was performed.       Resection was complete, and retrieval was complete. To prevent bleeding       after mucosal resection, three hemostatic clips were successfully       placed. There was no bleeding during, or at the end, of the procedure.      The exam was otherwise without abnormality on direct and retroflexion       views. Impression:            - One 15 mm polyp in the ascending colon in the                         proximal ascending colon, removed with mucosal                         resection. Resected and retrieved.                        - One 20 mm polyp in the proximal ascending colon,                         removed with mucosal resection. Resected and                         retrieved. Clips were placed.                        - The examination was otherwise normal on direct and                         retroflexion views.                        - Mucosal resection was performed. Resection and                         retrieval were complete.                        -  Mucosal resection was performed. Resection was                         complete, and retrieval was complete. Recommendation:        - Return patient to hospital ward for possible                         discharge same day.                        - Advance diet as tolerated.                        - Continue present medications.                         - Await pathology results.                        - Repeat colonoscopy in 6 months for surveillance                         after piecemeal polypectomy.                        - To visualize the small bowel, perform video capsule                         endoscopy in 2 weeks. Procedure Code(s):     --- Professional ---                        [phone], Colonoscopy, flexible; with endoscopic mucosal                         resection Diagnosis Code(s):     --- Professional ---                        K63.5, Polyp of colon                        D50.9, Iron deficiency anemia, unspecified CPT copyright 2019 American Medical Association. All rights reserved. The codes documented in this report are preliminary and upon coder review may  be revised to meet current compliance requirements. Jonathon Bellows, MD Jonathon Bellows MD, MD 04/29/2019 1:42:45 PM This report has been signed electronically. Number of Addenda: 0 Note Initiated On: 04/29/2019 12:42 PM Scope Withdrawal Time: 0 hours 35 minutes 8 seconds  Total Procedure Duration: 0 hours 40 minutes 0 seconds  Estimated Blood Loss:  Estimated blood loss: none.      [facility]

## 2019-04-29 NOTE — Op Note (Signed)
[facility] Gastroenterology Patient Name: Gregory Dixon Procedure Date: 04/29/2019 12:43 PM MRN: [id number] Account #: [id number] Date of Birth: 05-28-1940 Admit Type: Inpatient Age: 79 Room: [facility] ENDO ROOM 3 Gender: Male Note Status: Finalized Procedure:             Upper GI endoscopy Indications:           Iron deficiency anemia Providers:             Jonathon Bellows MD, MD Referring MD:          [facility] (Referring MD) Medicines:             Monitored Anesthesia Care Complications:         No immediate complications. Procedure:             Pre-Anesthesia Assessment:                        - Prior to the procedure, a History and Physical was                         performed, and patient medications, allergies and                         sensitivities were reviewed. The patient's tolerance                         of previous anesthesia was reviewed.                        - The risks and benefits of the procedure and the                         sedation options and risks were discussed with the                         patient. All questions were answered and informed                         consent was obtained.                        - ASA Grade Assessment: II - A patient with mild                         systemic disease.                        After obtaining informed consent, the endoscope was                         passed under direct vision. Throughout the procedure,                         the patient's blood pressure, pulse, and oxygen                         saturations were monitored continuously. The Endoscope                         was introduced through the mouth,  and advanced to the                         third part of duodenum. The upper GI endoscopy was                         accomplished with ease. The patient tolerated the                         procedure well. Findings:      The esophagus was normal.      The stomach was  normal.      The examined duodenum was normal. Impression:            - Normal esophagus.                        - Normal stomach.                        - Normal examined duodenum.                        - No specimens collected. Recommendation:        - Perform a colonoscopy today. Procedure Code(s):     --- Professional ---                        [phone], Esophagogastroduodenoscopy, flexible,                         transoral; diagnostic, including collection of                         specimen(s) by brushing or washing, when performed                         (separate procedure) Diagnosis Code(s):     --- Professional ---                        D50.9, Iron deficiency anemia, unspecified CPT copyright 2019 American Medical Association. All rights reserved. The codes documented in this report are preliminary and upon coder review may  be revised to meet current compliance requirements. Jonathon Bellows, MD Jonathon Bellows MD, MD 04/29/2019 12:57:15 PM This report has been signed electronically. Number of Addenda: 0 Note Initiated On: 04/29/2019 12:43 PM Estimated Blood Loss:  Estimated blood loss: none.      [facility]

## 2019-04-29 NOTE — Progress Notes (Signed)
Patient completed blood transfusion, tolerated well. Lungs clear. Ok to d/c per MD. Discharge instructions given to patient. Verbalized understanding. No acute distress at this time. IVs and tele removed.  Patient to call wife to transport him home.

## 2019-04-29 NOTE — Care Management Important Message (Signed)
Important Message  Patient Details  Name: Gregory Dixon MRN: [id number] Date of Birth: 24-Jul-1940   Medicare Important Message Given:  Yes     Dannette Barbara 04/29/2019, 11:41 AM

## 2019-04-29 NOTE — Anesthesia Preprocedure Evaluation (Signed)
Anesthesia Evaluation  Patient identified by MRN, date of birth, ID band Patient awake    Reviewed: Allergy & Precautions, NPO status , Patient's Chart, lab work & pertinent test results  History of Anesthesia Complications Negative for: history of anesthetic complications  Airway Mallampati: II  TM Distance: >3 FB Neck ROM: Full    Dental  (+) Upper Dentures, Lower Dentures   Pulmonary neg sleep apnea, neg COPD, former smoker,    breath sounds clear to auscultation- rhonchi (-) wheezing      Cardiovascular hypertension, Pt. on medications (-) CAD, (-) Past MI, (-) Cardiac Stents and (-) CABG  Rhythm:Regular Rate:Normal - Systolic murmurs and - Diastolic murmurs    Neuro/Psych neg Seizures negative neurological ROS  negative psych ROS   GI/Hepatic negative GI ROS, Neg liver ROS,   Endo/Other  diabetes, Insulin Dependent  Renal/GU Renal InsufficiencyRenal disease     Musculoskeletal negative musculoskeletal ROS (+)   Abdominal (+) + obese,   Peds  Hematology  (+) anemia ,   Anesthesia Other Findings Past Medical History: No date: Blood transfusion without reported diagnosis No date: Diabetes mellitus without complication (HCC)   Reproductive/Obstetrics                             Anesthesia Physical Anesthesia Plan  ASA: III  Anesthesia Plan: General   Post-op Pain Management:    Induction: Intravenous  PONV Risk Score and Plan: 1 and Propofol infusion  Airway Management Planned: Natural Airway  Additional Equipment:   Intra-op Plan:   Post-operative Plan:   Informed Consent: I have reviewed the patients History and Physical, chart, labs and discussed the procedure including the risks, benefits and alternatives for the proposed anesthesia with the patient or authorized representative who has indicated his/her understanding and acceptance.     Dental advisory  given  Plan Discussed with: CRNA and Anesthesiologist  Anesthesia Plan Comments:         Anesthesia Quick Evaluation

## 2019-04-29 NOTE — Discharge Summary (Signed)
[facility] NAME: Gregory Dixon    MR#:  [id number]  DATE OF BIRTH:  Aug 10, 1940  DATE OF ADMISSION:  04/27/2019 ADMITTING PHYSICIAN: Jonathon Bellows, MD  DATE OF DISCHARGE: 04/29/2019  PRIMARY CARE PHYSICIAN: [place], [facility]    ADMISSION DIAGNOSIS:  Acute blood loss anemia [D62] Acute lower GI bleeding [K92.2] Symptomatic anemia [D64.9] Gastrointestinal hemorrhage, unspecified gastrointestinal hemorrhage type [K92.2]  DISCHARGE DIAGNOSIS:  Active Problems:   Acute lower GI bleeding   Acute blood loss anemia   Type 2 diabetes mellitus with stage 4 chronic kidney disease, with long-term current use of insulin (HCC)   Acute renal failure superimposed on stage 3 chronic kidney disease (HCC)   Essential hypertension   Iron deficiency anemia due to chronic blood loss   Anemia due to vitamin B12 deficiency   Benign prostatic hyperplasia   SECONDARY DIAGNOSIS:   Past Medical History:  Diagnosis Date  . Blood transfusion without reported diagnosis   . Diabetes mellitus without complication ([place])     HOSPITAL COURSE:   1.  Chronic blood loss anemia.  The patient has severe iron deficiency anemia and also severe B12 deficiency.  The patient had an upper endoscopy which was normal.  The patient had a colonoscopy and 2 large polyps were removed.  Close clinical follow-up as outpatient is needed.  The patient's hemoglobin was as low as 5.  The patient received 3 units of packed red blood cells and came up to 7.4.  The patient will be given another unit of packed red blood cells prior to discharge.  The patient was also given IV Venofer 400 mg on 04/28/2019.  The patient also has B12 deficiency and IM B12 shots were given while here in the hospital.  Oral-B 12 upon discharge.  Sometimes a severe B12 deficiency can cause a severe iron deficiency anemia.  Dr. Vicente Males recommends a small bowel capsule endoscopy as outpatient.  We will also  refer to hematology for iron infusions.  We will start oral iron. 2.  Chronic kidney disease stage IV.  Will refer to hematology as outpatient.  Hold on losartan hydrochlorothiazide. 3.  Essential hypertension dose of Coreg and hydralazine decreased.  Continue to monitor blood pressure 4.  Type 2 diabetes mellitus with chronic kidney disease.  The patient takes 70/30 insulin at home very large doses A1c is down at 5.8.  Because of his kidney function I would decrease his dose to 10 units twice daily and continue to monitor for signs of hypoglycemia. 5.  BPH on Terazosin   DISCHARGE CONDITIONS:   Satisfactory  CONSULTS OBTAINED:  Gastroenterology  DRUG ALLERGIES:   Allergies  Allergen Reactions  . Aspirin Other (See Comments)    GI bleed  . Codeine Nausea Only and Nausea And Vomiting    DISCHARGE MEDICATIONS:   Allergies as of 04/29/2019      Reactions   Aspirin Other (See Comments)   GI bleed   Codeine Nausea Only, Nausea And Vomiting      Medication List    STOP taking these medications   olmesartan-hydrochlorothiazide 40-25 MG tablet Commonly known as: BENICAR HCT     TAKE these medications   carvedilol 6.25 MG tablet Commonly known as: COREG Take 1 tablet (6.25 mg total) by mouth 2 (two) times daily with a meal. What changed:   medication strength  how much to take  when to take this   fenofibrate 160 MG tablet Take  160 mg by mouth daily.   ferrous sulfate 325 (65 FE) MG EC tablet Take 1 tablet (325 mg total) by mouth 2 (two) times daily.   hydrALAZINE 25 MG tablet Commonly known as: APRESOLINE Take 1 tablet (25 mg total) by mouth 2 (two) times daily. What changed:   medication strength  how much to take   isosorbide mononitrate 30 MG 24 hr tablet Commonly known as: IMDUR Take 30 mg by mouth every evening.   NovoLOG Mix 70/30 FlexPen (70-30) 100 UNIT/ML FlexPen Generic drug: insulin aspart protamine - aspart Inject 0.1 mLs (10 Units total)  into the skin 2 (two) times daily. What changed: how much to take   terazosin 10 MG capsule Commonly known as: HYTRIN Take 10 mg by mouth every evening.   vitamin B-12 1000 MCG tablet Commonly known as: CYANOCOBALAMIN Take 1 tablet (1,000 mcg total) by mouth daily.        DISCHARGE INSTRUCTIONS:   Follow-up PMD 5 days Follow-up gastroenterology 2 weeks Follow-up hematology 3 weeks Follow-up nephrology 3-week  If you experience worsening of your admission symptoms, develop shortness of breath, life threatening emergency, suicidal or homicidal thoughts you must seek medical attention immediately by calling 911 or calling your MD immediately  if symptoms less severe.  You Must read complete instructions/literature along with all the possible adverse reactions/side effects for all the Medicines you take and that have been prescribed to you. Take any new Medicines after you have completely understood and accept all the possible adverse reactions/side effects.   Please note  You were cared for by a hospitalist during your hospital stay. If you have any questions about your discharge medications or the care you received while you were in the hospital after you are discharged, you can call the unit and asked to speak with the hospitalist on call if the hospitalist that took care of you is not available. Once you are discharged, your primary care physician will handle any further medical issues. Please note that NO REFILLS for any discharge medications will be authorized once you are discharged, as it is imperative that you return to your primary care physician (or establish a relationship with a primary care physician if you do not have one) for your aftercare needs so that they can reassess your need for medications and monitor your lab values.    Today   CHIEF COMPLAINT:   Chief Complaint  Patient presents with  . Abnormal Lab    HISTORY OF PRESENT ILLNESS:  Gregory Dixon  is a  79 y.o. male sent in for abnormal lab with a low hemoglobin.   VITAL SIGNS:  Blood pressure (!) 163/68, pulse 65, temperature (!) 97.5 F (36.4 C), temperature source Oral, resp. rate 16, height 6\' 6"  (1.981 m), weight 120.3 kg, SpO2 97 %.   PHYSICAL EXAMINATION:  GENERAL:  79 y.o.-year-old patient lying in the bed with no acute distress.  EYES: Pupils equal, round, reactive to light and accommodation. No scleral icterus. Extraocular muscles intact.  HEENT: Head atraumatic, normocephalic. Oropharynx and nasopharynx clear.  NECK:  Supple, no jugular venous distention. No thyroid enlargement, no tenderness.  LUNGS: Normal breath sounds bilaterally, no wheezing, rales,rhonchi or crepitation. No use of accessory muscles of respiration.  CARDIOVASCULAR: S1, S2 normal. No murmurs, rubs, or gallops.  ABDOMEN: Soft, non-tender, non-distended. Bowel sounds present. No organomegaly or mass.  EXTREMITIES:Trace pedal edema. No cyanosis, or clubbing.  NEUROLOGIC: Cranial nerves II through XII are intact. Muscle strength 5/5 in  all extremities. Sensation intact. Gait not checked.  PSYCHIATRIC: The patient is alert and oriented x 3.  SKIN: No obvious rash, lesion, or ulcer.   DATA REVIEW:   CBC Recent Labs  Lab 04/29/19 0619  WBC 4.8  HGB 7.4*  HCT 23.7*  PLT 198    Chemistries  Recent Labs  Lab 04/27/19 1149 04/28/19 0529 04/29/19 0619  NA 143   < > 142  K 4.2   < > 3.9  CL 112*   < > 108  CO2 24   < > 25  GLUCOSE 135*   < > 90  BUN 39*   < > 32*  CREATININE 2.86*   < > 3.00*  CALCIUM 8.4*   < > 8.4*  AST 17  --   --   ALT 13  --   --   ALKPHOS 19*  --   --   BILITOT 0.5  --   --    < > = values in this interval not displayed.    Microbiology Results  Results for orders placed or performed during the hospital encounter of 04/27/19  Respiratory Panel by RT PCR (Flu A&B, Covid) - Nasopharyngeal Swab     Status: None   Collection Time: 04/27/19  1:14 PM   Specimen:  Nasopharyngeal Swab  Result Value Ref Range Status   SARS Coronavirus 2 by RT PCR NEGATIVE NEGATIVE Final    Comment: (NOTE) SARS-CoV-2 target nucleic acids are NOT DETECTED. The SARS-CoV-2 RNA is generally detectable in upper respiratoy specimens during the acute phase of infection. The lowest concentration of SARS-CoV-2 viral copies this assay can detect is 131 copies/mL. A negative result does not preclude SARS-Cov-2 infection and should not be used as the sole basis for treatment or other patient management decisions. A negative result may occur with  improper specimen collection/handling, submission of specimen other than nasopharyngeal swab, presence of viral mutation(s) within the areas targeted by this assay, and inadequate number of viral copies (<131 copies/mL). A negative result must be combined with clinical observations, patient history, and epidemiological information. The expected result is Negative. Fact Sheet for Patients:  PinkCheek.be Fact Sheet for Healthcare Providers:  GravelBags.it This test is not yet ap proved or cleared by the [place] FDA and  has been authorized for detection and/or diagnosis of SARS-CoV-2 by FDA under an Emergency Use Authorization (EUA). This EUA will remain  in effect (meaning this test can be used) for the duration of the COVID-19 declaration under Section 564(b)(1) of the Act, 21 U.S.C. section 360bbb-3(b)(1), unless the authorization is terminated or revoked sooner.    Influenza A by PCR NEGATIVE NEGATIVE Final   Influenza B by PCR NEGATIVE NEGATIVE Final    Comment: (NOTE) The Xpert Xpress SARS-CoV-2/FLU/RSV assay is intended as an aid in  the diagnosis of influenza from Nasopharyngeal swab specimens and  should not be used as a sole basis for treatment. Nasal washings and  aspirates are unacceptable for Xpert Xpress SARS-CoV-2/FLU/RSV  testing. Fact Sheet for  Patients: PinkCheek.be Fact Sheet for Healthcare Providers: GravelBags.it This test is not yet approved or cleared by the [place] FDA and  has been authorized for detection and/or diagnosis of SARS-CoV-2 by  FDA under an Emergency Use Authorization (EUA). This EUA will remain  in effect (meaning this test can be used) for the duration of the  Covid-19 declaration under Section 564(b)(1) of the Act, 21  U.S.C. section 360bbb-3(b)(1), unless the authorization is  terminated  or revoked. Performed at [facility], [address] [place], [place] [address]      Management plans discussed with the patient, family and they are in agreement.  CODE STATUS:     Code Status Orders  (From admission, onward)         Start     Ordered   04/27/19 1345  Full code  Continuous     04/27/19 1347        Code Status History    This patient has a current code status but no historical code status.   Advance Care Planning Activity    Advance Directive Documentation     Most Recent Value  Type of Advance Directive  Healthcare Power of Attorney, Living will  Pre-existing out of facility DNR order (yellow form or pink MOST form)  --  "MOST" Form in Place?  --      TOTAL TIME TAKING CARE OF THIS PATIENT: 35 minutes.    Loletha Grayer M.D on 04/29/2019 at 3:33 PM  Between 7am to 6pm - Pager - [phone]  After 6pm go to [email or web address] - password [email or web address]  [facility]  CC: Primary care physician; [facility]

## 2019-04-29 NOTE — Transfer of Care (Signed)
Immediate Anesthesia Transfer of Care Note  Patient: Gregory Dixon  Procedure(s) Performed: ESOPHAGOGASTRODUODENOSCOPY (EGD) WITH PROPOFOL (N/A ) COLONOSCOPY WITH PROPOFOL (N/A )  Patient Location: PACU  Anesthesia Type:General  Level of Consciousness: drowsy  Airway & Oxygen Therapy: Patient Spontanous Breathing and Patient connected to nasal cannula oxygen  Post-op Assessment: Report given to RN and Post -op Vital signs reviewed and stable  Post vital signs: Reviewed and stable  Last Vitals:  Vitals Value Taken Time  BP    Temp    Pulse 65 04/29/19 1345  Resp 13 04/29/19 1345  SpO2 94 % 04/29/19 1345  Vitals shown include unvalidated device data.  Last Pain:  Vitals:   04/29/19 1344  TempSrc: (P) Temporal  PainSc:          Complications: No apparent anesthesia complications

## 2019-04-29 NOTE — H&P (Signed)
Jonathon Bellows, MD [address] [place], [place], [place], [address] [address], [place], [place], [place], [address] Phone: [phone]  Fax: [phone]  Primary Care Physician:  [facility]   Pre-Procedure History & Physical: HPI:  Gregory Dixon is a 79 y.o. male is here for an endoscopy and colonoscopy    Past Medical History:  Diagnosis Date  . Blood transfusion without reported diagnosis   . Diabetes mellitus without complication ([place])     History reviewed. No pertinent surgical history.  Prior to Admission medications   Medication Sig Start Date End Date Taking? Authorizing Provider  carvedilol (COREG) 25 MG tablet Take 25 mg by mouth 2 (two) times daily. 03/13/19  Yes [provider]  fenofibrate 160 MG tablet Take 160 mg by mouth daily. 03/13/19  Yes [provider]  hydrALAZINE (APRESOLINE) 50 MG tablet Take 100 mg by mouth 2 (two) times daily. 03/13/19  Yes [provider]  isosorbide mononitrate (IMDUR) 30 MG 24 hr tablet Take 30 mg by mouth every evening.  03/14/19  Yes [provider]  NOVOLOG MIX 70/30 FLEXPEN (70-30) 100 UNIT/ML FlexPen Inject 55 Units into the skin 2 (two) times daily. 03/13/19  Yes [provider]  olmesartan-hydrochlorothiazide (BENICAR HCT) 40-25 MG tablet Take 1 tablet by mouth daily. 03/13/19  Yes [provider]  terazosin (HYTRIN) 10 MG capsule Take 10 mg by mouth every evening.  03/13/19  Yes [provider]    Allergies as of 04/27/2019 - Review Complete 04/27/2019  Allergen Reaction Noted  . Aspirin Other (See Comments) 10/20/2016  . Codeine Nausea Only and Nausea And Vomiting 02/20/2011    No family history on file.  Social History   Socioeconomic History  . Marital status: Married    Spouse name: Not on file  . Number of children: Not on file  . Years of education: Not on file  . Highest education level: Not on file  Occupational History  .  Not on file  Tobacco Use  . Smoking status: Former Research scientist (life sciences)  . Smokeless tobacco: Never Used  Substance and Sexual Activity  . Alcohol use: Not Currently  . Drug use: Never  . Sexual activity: Not on file  Other Topics Concern  . Not on file  Social History Narrative  . Not on file   Social Determinants of Health   Financial Resource Strain:   . Difficulty of Paying Living Expenses: Not on file  Food Insecurity:   . Worried About Charity fundraiser in the Last Year: Not on file  . Ran Out of Food in the Last Year: Not on file  Transportation Needs:   . Lack of Transportation (Medical): Not on file  . Lack of Transportation (Non-Medical): Not on file  Physical Activity:   . Days of Exercise per Week: Not on file  . Minutes of Exercise per Session: Not on file  Stress:   . Feeling of Stress : Not on file  Social Connections:   . Frequency of Communication with Friends and Family: Not on file  . Frequency of Social Gatherings with Friends and Family: Not on file  . Attends Religious Services: Not on file  . Active Member of Clubs or Organizations: Not on file  . Attends Archivist Meetings: Not on file  . Marital Status: Not on file  Intimate Partner Violence:   . Fear of Current or Ex-Partner: Not on file  . Emotionally Abused:  Not on file  . Physically Abused: Not on file  . Sexually Abused: Not on file    Review of Systems: See HPI, otherwise negative ROS  Physical Exam: BP (!) 164/68   Pulse 70   Temp 98.2 F (36.8 C) (Temporal)   Resp 20   Ht 6\' 6"  (1.981 m)   Wt 120.3 kg   SpO2 97%   BMI 30.65 kg/m  General:   Alert,  pleasant and cooperative in NAD Head:  Normocephalic and atraumatic. Neck:  Supple; no masses or thyromegaly. Lungs:  Clear throughout to auscultation, normal respiratory effort.    Heart:  +S1, +S2, Regular rate and rhythm, No edema. Abdomen:  Soft, nontender and nondistended. Normal bowel sounds, without guarding, and without  rebound.   Neurologic:  Alert and  oriented x4;  grossly normal neurologically.  Impression/Plan: Gregory Dixon is here for an endoscopy and colonoscopy  to be performed for  evaluation of iron deficiency anemia.     Risks, benefits, limitations, and alternatives regarding endoscopy have been reviewed with the patient.  Questions have been answered.  All parties agreeable.   Jonathon Bellows, MD  04/29/2019, 12:48 PM

## 2019-04-30 LAB — TYPE AND SCREEN
ABO/RH(D): AB POS
Antibody Screen: NEGATIVE
Unit division: 0
Unit division: 0
Unit division: 0
Unit division: 0

## 2019-04-30 LAB — BPAM RBC
Blood Product Expiration Date: 202101272359
Blood Product Expiration Date: 202101272359
Blood Product Expiration Date: 202102142359
Blood Product Expiration Date: 202102222359
ISSUE DATE / TIME: 202101201452
ISSUE DATE / TIME: 202101201851
ISSUE DATE / TIME: 202101210925
ISSUE DATE / TIME: 202101221501
Unit Type and Rh: 600
Unit Type and Rh: 600
Unit Type and Rh: 6200
Unit Type and Rh: 6200

## 2019-05-02 ENCOUNTER — Encounter: Payer: Self-pay | Admitting: *Deleted

## 2019-05-02 LAB — SURGICAL PATHOLOGY

## 2019-05-05 DIAGNOSIS — D519 Vitamin B12 deficiency anemia, unspecified: Secondary | ICD-10-CM

## 2019-05-05 DIAGNOSIS — N400 Enlarged prostate without lower urinary tract symptoms: Secondary | ICD-10-CM

## 2019-05-10 ENCOUNTER — Telehealth: Payer: Self-pay

## 2019-05-10 NOTE — Telephone Encounter (Signed)
Patient verbalized understanding and put patient in the recall list for 6 months

## 2019-05-10 NOTE — Telephone Encounter (Signed)
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

## 2019-05-11 ENCOUNTER — Inpatient Hospital Stay: Payer: Medicare Other | Attending: Oncology | Admitting: Oncology

## 2019-05-11 ENCOUNTER — Other Ambulatory Visit: Payer: Self-pay

## 2019-05-11 ENCOUNTER — Inpatient Hospital Stay: Payer: Medicare Other

## 2019-05-11 VITALS — BP 162/70 | HR 65 | Temp 96.2°F | Wt 266.9 lb

## 2019-05-11 DIAGNOSIS — E119 Type 2 diabetes mellitus without complications: Secondary | ICD-10-CM | POA: Diagnosis not present

## 2019-05-11 DIAGNOSIS — Z794 Long term (current) use of insulin: Secondary | ICD-10-CM | POA: Insufficient documentation

## 2019-05-11 DIAGNOSIS — R5383 Other fatigue: Secondary | ICD-10-CM | POA: Diagnosis not present

## 2019-05-11 DIAGNOSIS — N189 Chronic kidney disease, unspecified: Secondary | ICD-10-CM | POA: Insufficient documentation

## 2019-05-11 DIAGNOSIS — D500 Iron deficiency anemia secondary to blood loss (chronic): Secondary | ICD-10-CM

## 2019-05-11 DIAGNOSIS — Z87891 Personal history of nicotine dependence: Secondary | ICD-10-CM | POA: Insufficient documentation

## 2019-05-11 DIAGNOSIS — E785 Hyperlipidemia, unspecified: Secondary | ICD-10-CM | POA: Insufficient documentation

## 2019-05-11 DIAGNOSIS — R531 Weakness: Secondary | ICD-10-CM | POA: Insufficient documentation

## 2019-05-11 DIAGNOSIS — D509 Iron deficiency anemia, unspecified: Secondary | ICD-10-CM

## 2019-05-11 DIAGNOSIS — H5440 Blindness, one eye, unspecified eye: Secondary | ICD-10-CM | POA: Insufficient documentation

## 2019-05-11 DIAGNOSIS — I10 Essential (primary) hypertension: Secondary | ICD-10-CM | POA: Insufficient documentation

## 2019-05-11 DIAGNOSIS — Z79899 Other long term (current) drug therapy: Secondary | ICD-10-CM | POA: Insufficient documentation

## 2019-05-11 DIAGNOSIS — Z886 Allergy status to analgesic agent status: Secondary | ICD-10-CM | POA: Diagnosis not present

## 2019-05-11 DIAGNOSIS — Z885 Allergy status to narcotic agent status: Secondary | ICD-10-CM | POA: Diagnosis not present

## 2019-05-11 LAB — SAMPLE TO BLOOD BANK

## 2019-05-11 LAB — CBC WITH DIFFERENTIAL/PLATELET
Abs Immature Granulocytes: 0.05 10*3/uL (ref 0.00–0.07)
Basophils Absolute: 0 10*3/uL (ref 0.0–0.1)
Basophils Relative: 0 %
Eosinophils Absolute: 0.1 10*3/uL (ref 0.0–0.5)
Eosinophils Relative: 2 %
HCT: 29.9 % — ABNORMAL LOW (ref 39.0–52.0)
Hemoglobin: 9.1 g/dL — ABNORMAL LOW (ref 13.0–17.0)
Immature Granulocytes: 1 %
Lymphocytes Relative: 23 %
Lymphs Abs: 1 10*3/uL (ref 0.7–4.0)
MCH: 26.8 pg (ref 26.0–34.0)
MCHC: 30.4 g/dL (ref 30.0–36.0)
MCV: 88.2 fL (ref 80.0–100.0)
Monocytes Absolute: 0.3 10*3/uL (ref 0.1–1.0)
Monocytes Relative: 7 %
Neutro Abs: 3 10*3/uL (ref 1.7–7.7)
Neutrophils Relative %: 67 %
Platelets: 153 10*3/uL (ref 150–400)
RBC: 3.39 MIL/uL — ABNORMAL LOW (ref 4.22–5.81)
RDW: 15.4 % (ref 11.5–15.5)
WBC: 4.5 10*3/uL (ref 4.0–10.5)
nRBC: 0 % (ref 0.0–0.2)

## 2019-05-11 LAB — IRON AND TIBC
Iron: 42 ug/dL — ABNORMAL LOW (ref 45–182)
Saturation Ratios: 10 % — ABNORMAL LOW (ref 17.9–39.5)
TIBC: 427 ug/dL (ref 250–450)
UIBC: 385 ug/dL

## 2019-05-11 LAB — FERRITIN: Ferritin: 24 ng/mL (ref 24–336)

## 2019-05-11 LAB — VITAMIN B12: Vitamin B-12: 278 pg/mL (ref 180–914)

## 2019-05-11 NOTE — Progress Notes (Signed)
Pt called to go over information related to [facility] visit at [facility]. Questions and concerns gone over. Pt has no specific questions or concerns at this time.  

## 2019-05-11 NOTE — Progress Notes (Signed)
[name]  Telephone:(336) [phone] Fax:(336) [phone]  ID: Gregory Dixon OB: 16-Feb-1941  MR#: [id number]  FMB#:[id number]  Patient Care Team: Gregory Castle, MD as PCP - General (Family Medicine)  CHIEF COMPLAINT: Iron deficiency anemia.  INTERVAL HISTORY: Patient is a 79 year old male last evaluated in clinic greater than 8 years ago who is referred back for iron deficiency anemia likely secondary to GI source.  He was recently admitted to the hospital and found to have a hemoglobin of less than 6.  He received 3 units of packed red blood cells as well as IV Venofer while in the hospital.  Colonoscopy, EGD, and video endoscopy were unrevealing.  Patient feels improved since his hospitalization, but not back to his baseline.  He has no neurologic complaints.  He denies any recent fevers.  He has a good appetite and denies weight loss.  He has no chest pain, shortness of breath, cough, or hemoptysis.  He denies any nausea, vomiting, constipation, or diarrhea.  He has bleeding from hemorrhoids, but denies any melena or hematochezia.  He has no urinary complaints.  Patient offers no further specific complaints today.  REVIEW OF SYSTEMS:   Review of Systems  Constitutional: Positive for malaise/fatigue. Negative for fever and weight loss.  Respiratory: Negative.  Negative for cough and hemoptysis.   Cardiovascular: Negative.  Negative for chest pain and leg swelling.  Gastrointestinal: Negative.  Negative for abdominal pain, blood in stool and melena.  Genitourinary: Negative.  Negative for hematuria.  Musculoskeletal: Negative.  Negative for back pain.  Skin: Negative.  Negative for rash.  Neurological: Positive for weakness. Negative for dizziness, focal weakness and headaches.  Psychiatric/Behavioral: Negative.  The patient is not nervous/anxious.     As per HPI. Otherwise, a complete review of systems is negative.  PAST MEDICAL HISTORY: Past Medical History:    Diagnosis Date  . Blood transfusion without reported diagnosis   . Diabetes mellitus without complication ([place])     PAST SURGICAL HISTORY: Past Surgical History:  Procedure Laterality Date  . COLONOSCOPY WITH PROPOFOL N/A 04/29/2019   Procedure: COLONOSCOPY WITH PROPOFOL;  Surgeon: Gregory Bellows, MD;  Location[facility] ENDOSCOPY;  Service: Gastroenterology;  Laterality: N/A;  . ESOPHAGOGASTRODUODENOSCOPY (EGD) WITH PROPOFOL N/A 04/29/2019   Procedure: ESOPHAGOGASTRODUODENOSCOPY (EGD) WITH PROPOFOL;  Surgeon: Gregory Bellows, MD;  Location: [facility] ENDOSCOPY;  Service: Gastroenterology;  Laterality: N/A;    FAMILY HISTORY: No family history on file.  ADVANCED DIRECTIVES (Y/N):  N  HEALTH MAINTENANCE: Social History   Tobacco Use  . Smoking status: Former Research scientist (life sciences)  . Smokeless tobacco: Never Used  Substance Use Topics  . Alcohol use: Not Currently  . Drug use: Never     Colonoscopy:  PAP:  Bone density:  Lipid panel:  Allergies  Allergen Reactions  . Aspirin Other (See Comments)    GI bleed  . Codeine Nausea Only and Nausea And Vomiting    Current Outpatient Medications  Medication Sig Dispense Refill  . carvedilol (COREG) 6.25 MG tablet Take 1 tablet (6.25 mg total) by mouth 2 (two) times daily with a meal. 60 tablet 0  . fenofibrate 160 MG tablet Take 160 mg by mouth daily.    . ferrous sulfate 325 (65 FE) MG EC tablet Take 1 tablet (325 mg total) by mouth 2 (two) times daily. 60 tablet 0  . hydrALAZINE (APRESOLINE) 25 MG tablet Take 1 tablet (25 mg total) by mouth 2 (two) times daily. 60 tablet 0  . isosorbide mononitrate (IMDUR)  30 MG 24 hr tablet Take 30 mg by mouth every evening.     Marland Kitchen NOVOLOG MIX 70/30 FLEXPEN (70-30) 100 UNIT/ML FlexPen Inject 0.1 mLs (10 Units total) into the skin 2 (two) times daily. 15 mL 11  . terazosin (HYTRIN) 10 MG capsule Take 10 mg by mouth every evening.     . vitamin B-12 (CYANOCOBALAMIN) 1000 MCG tablet Take 1 tablet (1,000 mcg total) by mouth  daily. 30 tablet 0   No current facility-administered medications for this visit.    OBJECTIVE: Vitals:   05/11/19 1130  BP: (!) 162/70  Pulse: 65  Temp: (!) 96.2 F (35.7 C)     Body mass index is 30.84 kg/m.    ECOG FS:1 - Symptomatic but completely ambulatory  General: Well-developed, well-nourished, no acute distress. Eyes: Pink conjunctiva, anicteric sclera. HEENT: Normocephalic, moist mucous membranes. Lungs: No audible wheezing or coughing. Heart: Regular rate and rhythm. Abdomen: Soft, nontender, no obvious distention. Musculoskeletal: No edema, cyanosis, or clubbing. Neuro: Alert, answering all questions appropriately. Cranial nerves grossly intact. Skin: No rashes or petechiae noted. Psych: Normal affect. Lymphatics: No cervical, calvicular, axillary or inguinal LAD.   LAB RESULTS:  Lab Results  Component Value Date   NA 142 04/29/2019   K 3.9 04/29/2019   CL 108 04/29/2019   CO2 25 04/29/2019   GLUCOSE 90 04/29/2019   BUN 32 (H) 04/29/2019   CREATININE 3.00 (H) 04/29/2019   CALCIUM 8.4 (L) 04/29/2019   PROT 5.9 (L) 04/27/2019   ALBUMIN 3.1 (L) 04/27/2019   AST 17 04/27/2019   ALT 13 04/27/2019   ALKPHOS 19 (L) 04/27/2019   BILITOT 0.5 04/27/2019   GFRNONAA 19 (L) 04/29/2019   GFRAA 22 (L) 04/29/2019    Lab Results  Component Value Date   WBC 4.5 05/11/2019   NEUTROABS 3.0 05/11/2019   HGB 9.1 (L) 05/11/2019   HCT 29.9 (L) 05/11/2019   MCV 88.2 05/11/2019   PLT 153 05/11/2019   Lab Results  Component Value Date   IRON 42 (L) 05/11/2019   TIBC 427 05/11/2019   IRONPCTSAT 10 (L) 05/11/2019   Lab Results  Component Value Date   FERRITIN 24 05/11/2019     STUDIES: No results found.  ASSESSMENT: Iron deficiency anemia, possibly GI source.  PLAN:    1.  Iron deficiency anemia: EGD, virtual endoscopy, and colonoscopy in January 2021 were unrevealing, but 2 polyps were removed.  No malignancy was found, but recommendation was to repeat  colonoscopy in 6 months in July 2021.  Patient received 3 units of packed red blood cells as well as 200 mg IV Venofer while in the hospital with improvement of his hemoglobin to 9.1.  His iron stores continue to be decreased and he will benefit from additional IV Venofer.  Return to clinic in 1, 2, and 3 weeks to receive IV Venofer only.  Patient will then return to clinic in 2 months with repeat laboratory work, further evaluation, and continuation of treatment if necessary.  I spent a total of 45 minutes reviewing chart data, face-to-face evaluation with the patient, counseling and coordination of care as detailed above.   Patient expressed understanding and was in agreement with this plan. He also understands that He can call clinic at any time with any questions, concerns, or complaints.    Lloyd Huger, MD   05/11/2019 2:23 PM

## 2019-05-12 DIAGNOSIS — C4491 Basal cell carcinoma of skin, unspecified: Secondary | ICD-10-CM

## 2019-05-12 HISTORY — DX: Basal cell carcinoma of skin, unspecified: C44.91

## 2019-05-16 ENCOUNTER — Other Ambulatory Visit: Payer: Self-pay | Admitting: Nephrology

## 2019-05-16 DIAGNOSIS — N184 Chronic kidney disease, stage 4 (severe): Secondary | ICD-10-CM

## 2019-05-16 DIAGNOSIS — E1122 Type 2 diabetes mellitus with diabetic chronic kidney disease: Secondary | ICD-10-CM

## 2019-05-18 ENCOUNTER — Ambulatory Visit (INDEPENDENT_AMBULATORY_CARE_PROVIDER_SITE_OTHER): Payer: Medicare Other | Admitting: Gastroenterology

## 2019-05-18 ENCOUNTER — Other Ambulatory Visit: Payer: Self-pay

## 2019-05-18 ENCOUNTER — Telehealth: Payer: Self-pay | Admitting: Gastroenterology

## 2019-05-18 VITALS — BP 166/63 | HR 76 | Temp 97.9°F | Ht 78.0 in | Wt 271.0 lb

## 2019-05-18 DIAGNOSIS — D509 Iron deficiency anemia, unspecified: Secondary | ICD-10-CM

## 2019-05-18 DIAGNOSIS — D519 Vitamin B12 deficiency anemia, unspecified: Secondary | ICD-10-CM

## 2019-05-18 NOTE — Progress Notes (Signed)
Jonathon Bellows MD, MRCP(U.K) [address]  [place]  [place], [place] [address]  Main: [phone]  Fax: [phone]   Primary Care Physician: Valera Castle, MD  Primary Gastroenterologist:  Dr. Jonathon Bellows   Chief Complaint  Patient presents with  . Hospitalization Follow-up    Anemia, GI bleed    HPI: Gregory Dixon is a 79 y.o. male    Summary of history :  He was admitted to the hospital on 04/27/2019.  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  On admission he had bright red blood per rectum.  Hemoglobin of 5.5 g with a BUN of 39 and a creatinine of 2.86.  B12 levels were low at 112 and ferritin 5.  Hemoglobin was 12.9 g in 2014.  He has history of hemorrhoid surgically treated many years back. Denied any NSAID use.  Received IV iron and started on B12 supplementation.  04/29/2019: EGD normal study, colonoscopy a 15 mm polyp was seen in the ascending colon proximal aspect.  Resected via EMR.  Another 20 mm polyp was seen in the proximal ascending colon and also resected by EMR.  Polyps were taken out piecemeal.  Plan was to perform capsule study of small bowel as an outpatient.Pathology report demonstrated tubular adenoma.   Interval history   04/29/2019-05/18/2019   2021: Iron 42 TIBC 427.  Hemoglobin 9.1 g.  Was 7.4 g 2 weeks back. Doing well since hospital discharge.  Feels much better and stronger.  Receiving iron infusions.  On oral B12 pills.  No other complaints.  Current Outpatient Medications  Medication Sig Dispense Refill  . carvedilol (COREG) 6.25 MG tablet Take 1 tablet (6.25 mg total) by mouth 2 (two) times daily with a meal. 60 tablet 0  . fenofibrate 160 MG tablet Take 160 mg by mouth daily.    . ferrous sulfate 325 (65 FE) MG EC tablet Take 1 tablet (325 mg total) by mouth 2 (two) times daily. 60 tablet 0  . hydrALAZINE (APRESOLINE) 25 MG tablet Take 1 tablet (25 mg total) by mouth 2 (two) times daily. 60  tablet 0  . isosorbide mononitrate (IMDUR) 30 MG 24 hr tablet Take 30 mg by mouth every evening.     Marland Kitchen NOVOLOG MIX 70/30 FLEXPEN (70-30) 100 UNIT/ML FlexPen Inject 0.1 mLs (10 Units total) into the skin 2 (two) times daily. 15 mL 11  . olmesartan-hydrochlorothiazide (BENICAR HCT) 40-25 MG tablet     . terazosin (HYTRIN) 10 MG capsule Take 10 mg by mouth every evening.     . vitamin B-12 (CYANOCOBALAMIN) 1000 MCG tablet Take 1 tablet (1,000 mcg total) by mouth daily. 30 tablet 0   No current facility-administered medications for this visit.    Allergies as of 05/18/2019 - Review Complete 05/18/2019  Allergen Reaction Noted  . Aspirin Other (See Comments) 10/20/2016  . Codeine Nausea Only and Nausea And Vomiting 02/20/2011    ROS:  General: Negative for anorexia, weight loss, fever, chills, fatigue, weakness. ENT: Negative for hoarseness, difficulty swallowing , nasal congestion. CV: Negative for chest pain, angina, palpitations, dyspnea on exertion, peripheral edema.  Respiratory: Negative for dyspnea at rest, dyspnea on exertion, cough, sputum, wheezing.  GI: See history of present illness. GU:  Negative for dysuria, hematuria, urinary incontinence, urinary frequency, nocturnal urination.  Endo: Negative for unusual weight change.    Physical Examination:   BP (!) 166/63   Pulse 76   Temp 97.9 F (36.6  C)   Ht 6\' 6"  (1.981 m)   Wt 271 lb (122.9 kg)   BMI 31.32 kg/m   General: Well-nourished, well-developed in no acute distress.  Eyes: No icterus. Conjunctivae pink. Mouth: Oropharyngeal mucosa moist and pink , no lesions erythema or exudate. Lungs: Clear to auscultation bilaterally. Non-labored. Heart: Regular rate and rhythm, no murmurs rubs or gallops.  Abdomen: Bowel sounds are normal, nontender, nondistended, no hepatosplenomegaly or masses, no abdominal bruits or hernia , no rebound or guarding.   Extremities: No lower extremity edema. No clubbing or  deformities. Neuro: Alert and oriented x 3.  Grossly intact. Skin: Warm and dry, no jaundice.   Psych: Alert and cooperative, normal mood and affect.   Imaging Studies: No results found.  Assessment and Plan:   Gregory Dixon is a 79 y.o. y/o male here to follow-up to hospital discharge when he presented with symptomatic anemia with 5.5 g.  Severe B12 deficiency and iron deficiency.  EGD did not demonstrate any gross lesion.  Colonoscopy to only showed 2 adenomas which were large in size and taken out piecemeal.  Plan 1.  Capsule study of the small bowel 2.  Colonoscopy for surveillance of colon polyp in 6 months as was resected piecemeal. 3.  Continue iron and B12 replacement. 4.  Check iron studies and B12 levels in 4 to 6 weeks.  Along with hemoglobin  Risks, benefits, alternatives of Givens capsule discussed with patient to include but not limited to the rare risk of Given's capsule becoming lodged in the GI tract requiring surgical removal.  The patient agrees with this plan & consent will be obtained.   Dr Jonathon Bellows  MD,MRCP [facility]) Follow up in the months

## 2019-05-18 NOTE — Telephone Encounter (Signed)
Patient's wife Gregory Dixon called & would like to see if they can schedule to procedure on 05-27-2019.

## 2019-05-19 ENCOUNTER — Other Ambulatory Visit: Payer: Self-pay

## 2019-05-19 ENCOUNTER — Inpatient Hospital Stay: Payer: Medicare Other

## 2019-05-19 ENCOUNTER — Telehealth: Payer: Self-pay | Admitting: Gastroenterology

## 2019-05-19 VITALS — BP 176/69 | HR 66 | Temp 98.9°F | Resp 20

## 2019-05-19 DIAGNOSIS — D500 Iron deficiency anemia secondary to blood loss (chronic): Secondary | ICD-10-CM

## 2019-05-19 DIAGNOSIS — D509 Iron deficiency anemia, unspecified: Secondary | ICD-10-CM | POA: Diagnosis not present

## 2019-05-19 MED ORDER — SODIUM CHLORIDE 0.9 % IV SOLN: 200.0000 mg | Freq: Once | INTRAVENOUS | Status: DC

## 2019-05-19 MED ORDER — IRON SUCROSE 20 MG/ML IV SOLN
200.0000 mg | Freq: Once | INTRAVENOUS | Status: AC
  Administered 2019-05-19: 200 mg via INTRAVENOUS
  Filled 2019-05-19: qty 10

## 2019-05-19 MED ORDER — SODIUM CHLORIDE 0.9 % IV SOLN
Freq: Once | INTRAVENOUS | Status: AC
  Filled 2019-05-19: qty 250

## 2019-05-19 NOTE — Telephone Encounter (Signed)
Patient came into the office to cancel his capsule study scheduled for 06-01-19. He may call back & r/s he is seeing so many doctor's right now. He's having iron infusions.

## 2019-05-24 ENCOUNTER — Other Ambulatory Visit: Payer: Self-pay

## 2019-05-24 ENCOUNTER — Ambulatory Visit
Admission: RE | Admit: 2019-05-24 | Discharge: 2019-05-24 | Disposition: A | Discharge status: Home / Self Care | Payer: Medicare Other | Source: Ambulatory Visit | Attending: Nephrology | Admitting: Nephrology

## 2019-05-24 DIAGNOSIS — N184 Chronic kidney disease, stage 4 (severe): Secondary | ICD-10-CM | POA: Diagnosis present

## 2019-05-24 DIAGNOSIS — E1122 Type 2 diabetes mellitus with diabetic chronic kidney disease: Secondary | ICD-10-CM | POA: Diagnosis present

## 2019-05-24 IMAGING — US US RENAL
1 series · 14 of 25 positions shown · non-contrast
Comparison: None.

CLINICAL DATA: Chronic renal disease

EXAM:
RENAL / URINARY TRACT ULTRASOUND COMPLETE

[Series 1: us renal · 0.30mm/px · 14 of 52 slices shown]
[im 1/52]
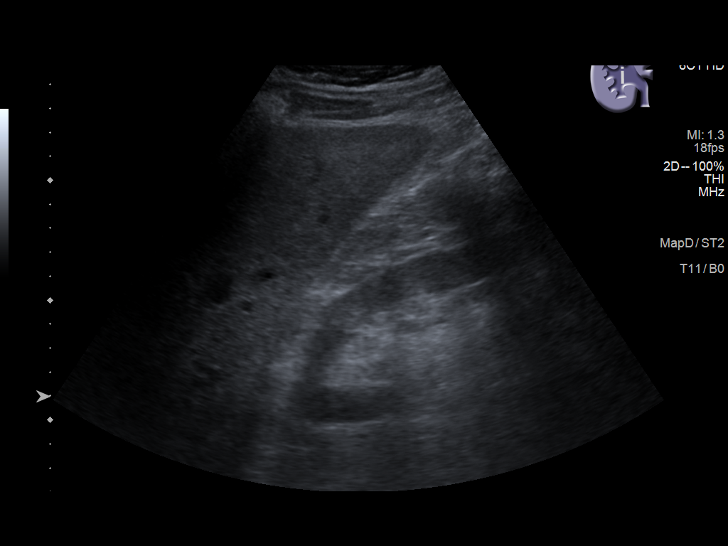
[im 5/52]
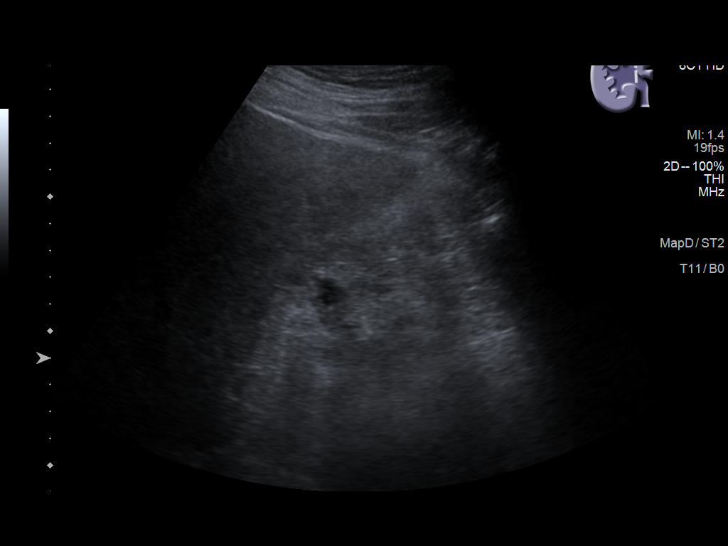
[im 9/52]
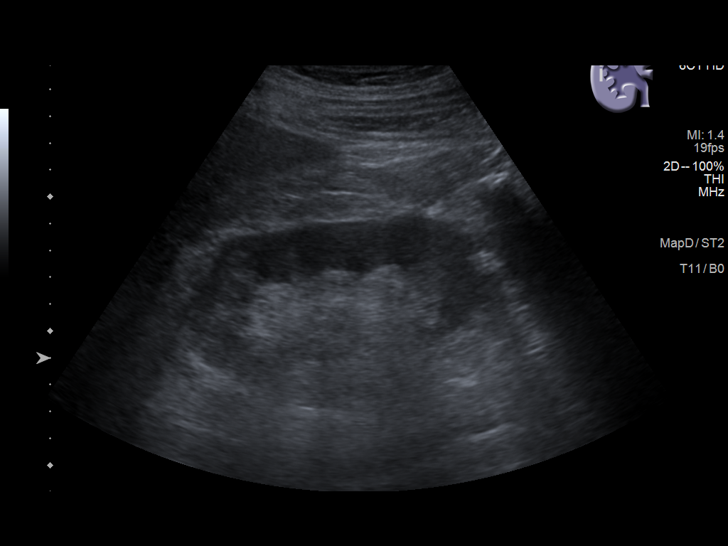
[im 13/52]
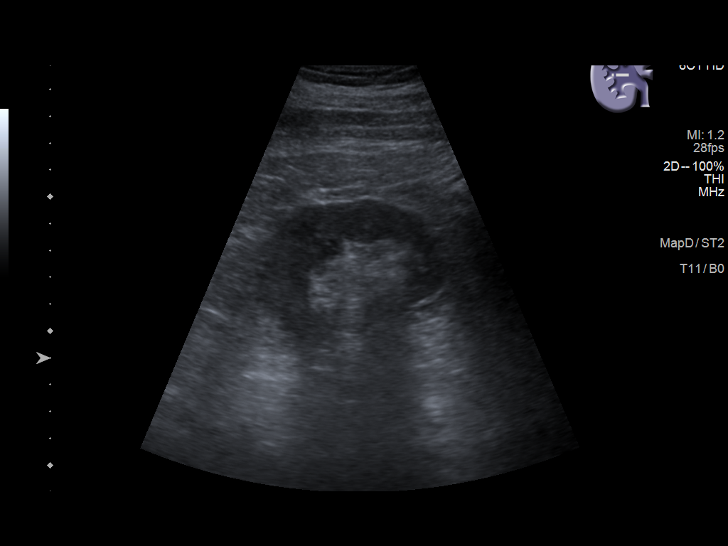
[im 18/52]
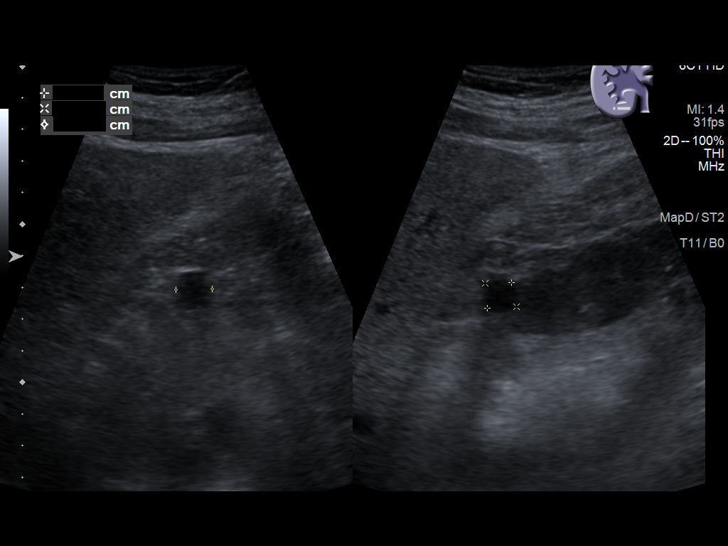
[im 20/52]
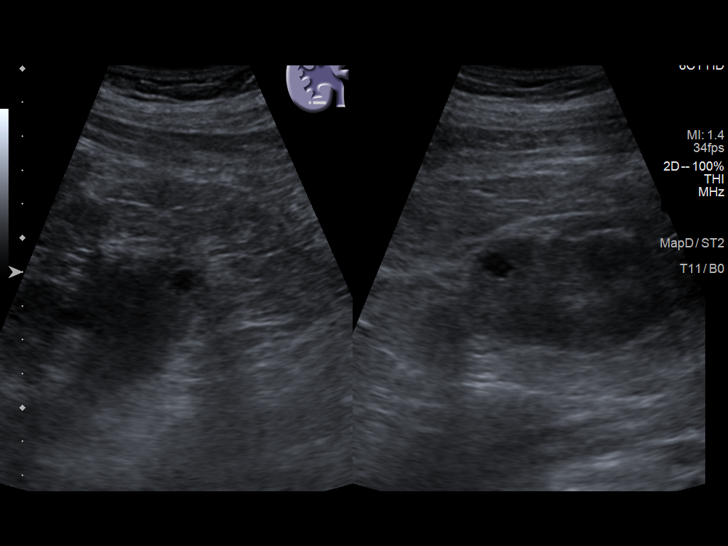
[im 24/52]
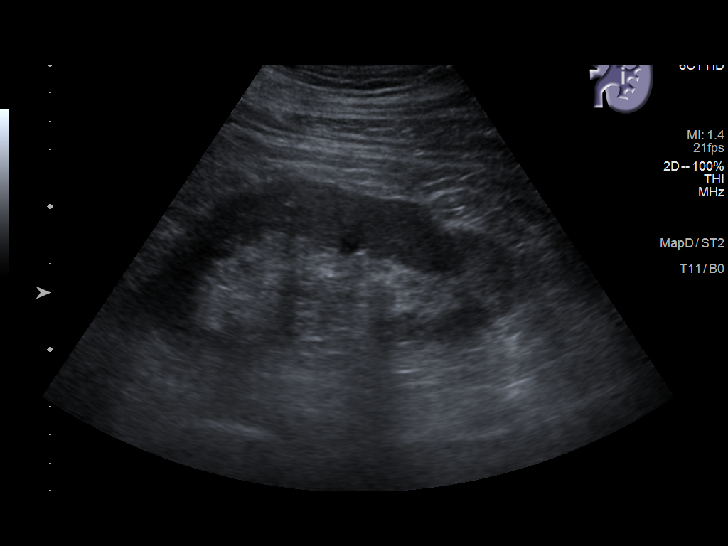
[im 28/52]
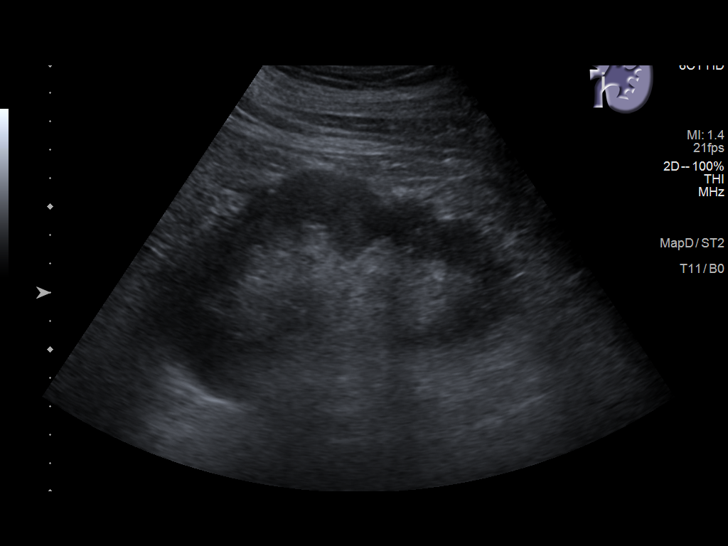
[im 32/52]
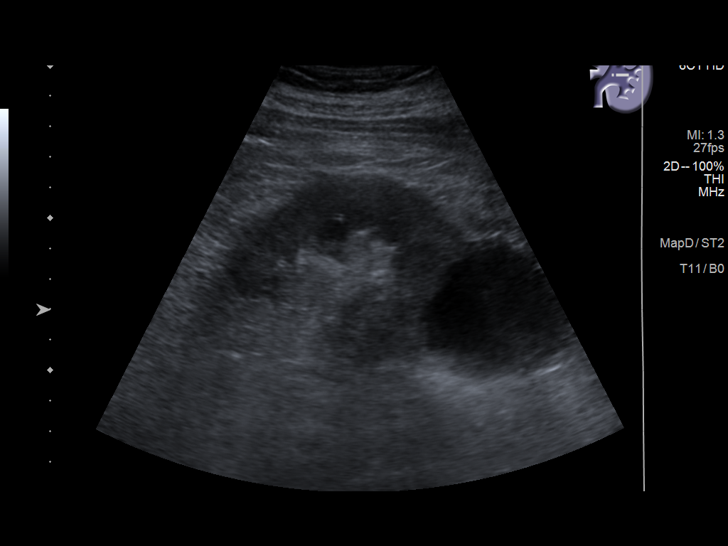
[im 35/52]
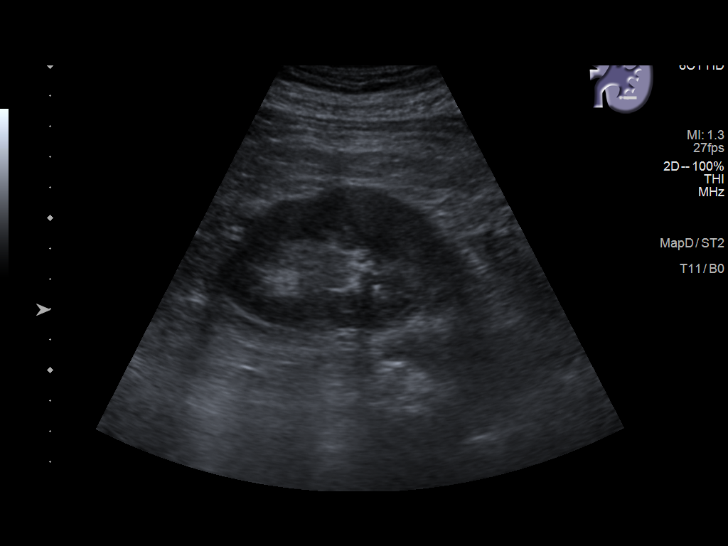
[im 39/52]
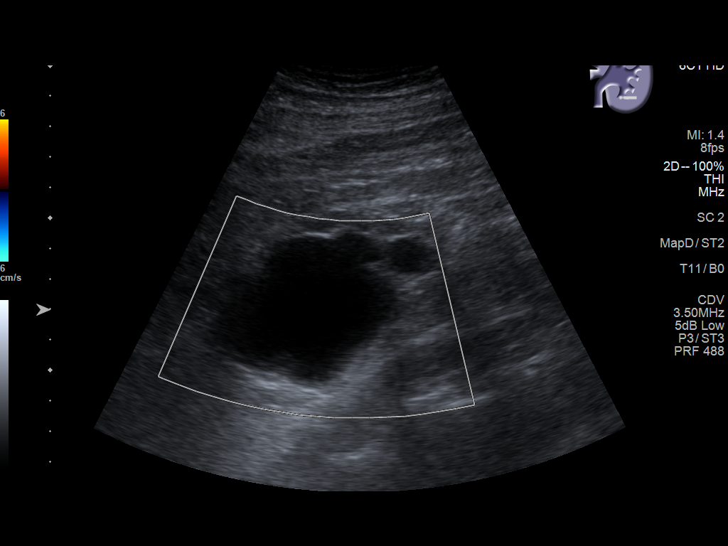
[im 43/52]
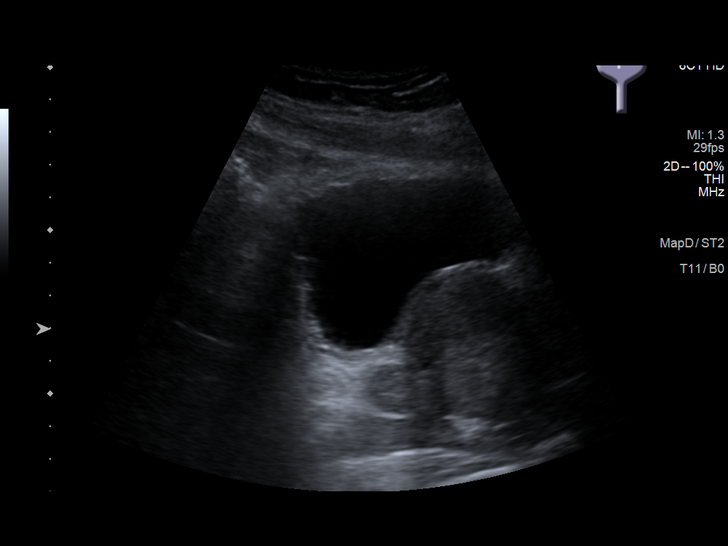
[im 47/52]
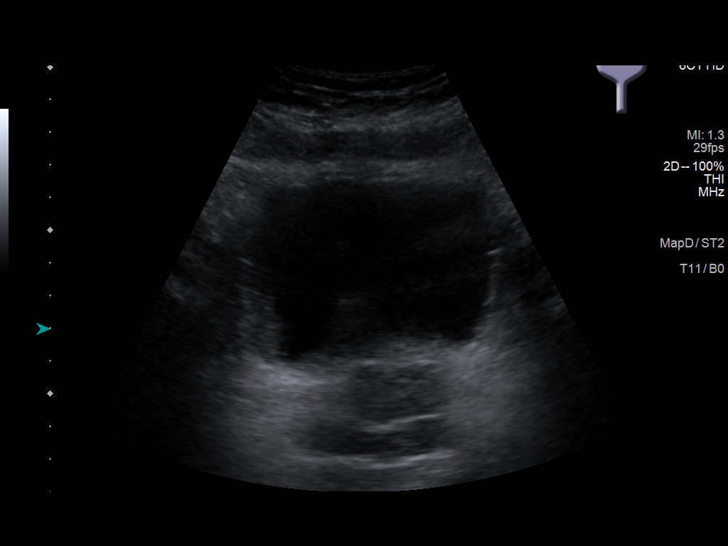
[im 52/52]
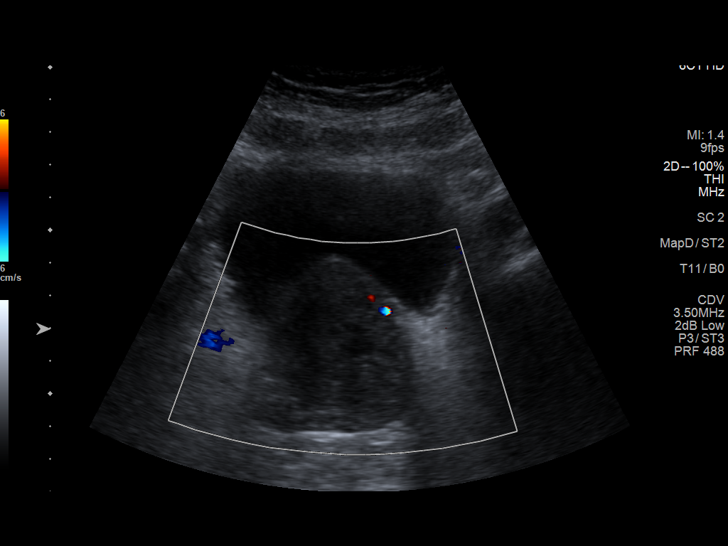

[14 of 25 positions shown; findings below may reference images not displayed]

FINDINGS: Right Kidney:

Renal measurements: 11.7 x 5.6 x 5.1 cm. = volume: 176 mL. No mass
lesion or hydronephrosis is noted. Scattered cysts are noted within
the right kidney. The largest of these lies in the upper pole
measuring 1.2 cm.

Left Kidney:

Renal measurements: 13.1 x 5.9 x 6.7 cm = volume: 274 mL. No
hydronephrosis is noted. Scattered cysts are noted primarily in the
upper pole. The largest of these measures 5.8 cm with some central
septations.

Bladder:

Appears normal for degree of bladder distention.

Other:

None.
IMPRESSION: Bilateral renal cysts. The large cyst on the left demonstrates a
single septation. Short-term follow-up in 6-12 months to assess for
stability is recommended.

No other focal abnormality is noted.

## 2019-05-26 ENCOUNTER — Inpatient Hospital Stay: Payer: Medicare Other

## 2019-05-26 ENCOUNTER — Other Ambulatory Visit: Payer: Self-pay

## 2019-05-26 NOTE — Progress Notes (Signed)
Gregory Dixon BP was 212/76 when he first arrived to get his Venofer. I let him sit for 20 minutes and rechecked his BP and it was 216/86 HR 66. Dr. Grayland Ormond notified, and he reccommended that the patient reschedule and come back next week. I informed Mr. Pandit to monitor his BP at home and be sure to take his BP medications as prescribed. Patient verbalized understanding and was sent home.

## 2019-05-27 ENCOUNTER — Telehealth: Payer: Self-pay | Admitting: *Deleted

## 2019-05-27 NOTE — Telephone Encounter (Signed)
Call from patient and wife regarding the fact that his BP is still elevated thsi morning 217/106. Patient reports that when he was in the hospital recently, they stopped his Benicar/HCT and cut his Carvedilol and Hydralazine doses in half. He is asking f these need to be increased. Please advise if you will handle or does he need to contact his PCP for this?

## 2019-05-27 NOTE — Telephone Encounter (Signed)
Patient advised to contact PCP

## 2019-05-27 NOTE — Telephone Encounter (Signed)
Please contact PCP

## 2019-06-01 ENCOUNTER — Other Ambulatory Visit: Payer: Self-pay

## 2019-06-01 ENCOUNTER — Ambulatory Visit: Admit: 2019-06-01 | Payer: Medicare Other | Admitting: Gastroenterology

## 2019-06-01 SURGERY — IMAGING PROCEDURE, GI TRACT, INTRALUMINAL, VIA CAPSULE

## 2019-06-02 ENCOUNTER — Inpatient Hospital Stay: Payer: Medicare Other

## 2019-06-02 ENCOUNTER — Other Ambulatory Visit: Payer: Self-pay

## 2019-06-02 VITALS — BP 180/78 | HR 60 | Temp 97.0°F | Resp 18

## 2019-06-02 DIAGNOSIS — D500 Iron deficiency anemia secondary to blood loss (chronic): Secondary | ICD-10-CM

## 2019-06-02 DIAGNOSIS — D509 Iron deficiency anemia, unspecified: Secondary | ICD-10-CM | POA: Diagnosis not present

## 2019-06-02 MED ORDER — SODIUM CHLORIDE 0.9 % IV SOLN
Freq: Once | INTRAVENOUS | Status: AC
  Filled 2019-06-02: qty 250

## 2019-06-02 MED ORDER — IRON SUCROSE 20 MG/ML IV SOLN
200.0000 mg | Freq: Once | INTRAVENOUS | Status: AC
  Administered 2019-06-02: 14:00:00 200 mg via INTRAVENOUS
  Filled 2019-06-02: qty 10

## 2019-06-02 NOTE — Progress Notes (Signed)
Pt.'s BP is 180/86 prior to venofer infusion. Pt is asymptomatic and has no complaints at this time. MD made aware. Per MD to continue with venofer infusion today. Pt updated and all questions answered at this time. Post venofer infusion, pt.'s BP 180/78. Pt has no complaints at this time and is stable for discharge.   Kendell Gammon [employer]

## 2019-06-08 ENCOUNTER — Encounter: Payer: Self-pay | Admitting: Physical Therapy

## 2019-06-08 ENCOUNTER — Other Ambulatory Visit: Payer: Self-pay

## 2019-06-08 ENCOUNTER — Ambulatory Visit: Payer: Medicare Other | Attending: Family Medicine | Admitting: Physical Therapy

## 2019-06-08 DIAGNOSIS — R2681 Unsteadiness on feet: Secondary | ICD-10-CM | POA: Diagnosis present

## 2019-06-08 DIAGNOSIS — M6281 Muscle weakness (generalized): Secondary | ICD-10-CM | POA: Diagnosis present

## 2019-06-08 NOTE — Therapy (Signed)
[place] MAIN [facility] SERVICES [address] [place], [place], [address] Phone: [phone]   Fax:  [phone]  Physical Therapy Wheelchair Evaluation  Patient Details  Name: Gregory Dixon MRN: [id number] Date of Birth: [birth year] No data recorded  Encounter Date: 06/08/2019  PT End of Session - 06/08/19 0757    Visit Number  1    Number of Visits  1    Date for PT Re-Evaluation  06/08/19    PT Start Time  0805    PT Stop Time  0930    PT Time Calculation (min)  85 min    Equipment Utilized During Treatment  Gait belt    Activity Tolerance  Patient tolerated treatment well    Behavior During Therapy  WFL for tasks assessed/performed       Past Medical History:  Diagnosis Date  . Actinic keratosis   . Basal cell carcinoma 10/21/2017   left lateral neck, excised 02/09/2018  . Basal cell carcinoma (BCC) 05/12/2019   right posterior ear, excised 05/31/2019  . Blood transfusion without reported diagnosis   . Diabetes mellitus without complication [facility])     Past Surgical History:  Procedure Laterality Date  . COLONOSCOPY WITH PROPOFOL N/A 04/29/2019   Procedure: COLONOSCOPY WITH PROPOFOL;  Surgeon: Jonathon Bellows, MD;  Location: [facility] ENDOSCOPY;  Service: Gastroenterology;  Laterality: N/A;  . ESOPHAGOGASTRODUODENOSCOPY (EGD) WITH PROPOFOL N/A 04/29/2019   Procedure: ESOPHAGOGASTRODUODENOSCOPY (EGD) WITH PROPOFOL;  Surgeon: Jonathon Bellows, MD;  Location: [facility] ENDOSCOPY;  Service: Gastroenterology;  Laterality: N/A;    There were no vitals filed for this visit.       PATIENT INFORMATION: Reason for Referral: Power Clinical research associate Goals: To get a scooter Patient was seen for face-to-face evaluation for new scooter.   Further paperwork was completed and sent to vendor.  Patient appears to qualify for power mobility device at this time per objective findings.   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, HTN (controlled with medication), Acute blood loss with anemia (unsure of cause), CKD stage III, weakness Primary Diagnosis Onset: has had Diabetes at least 30 years;  [] Progressive Disease Relevant Past and Future Surgeries: None Height: 6' 6"  Weight: 265 pounds Explain and recent changes or trends in weight: none  Relevant History including falls:Pt is currently walking with walking stick or SPC for all ambulation; has had recent falls due to foot catching/dragging;       HOME ENVIRONMENT: [x] House  [] Condo/town home  [] Apartment  [] Assisted Living    [] Lives Alone [x]  Lives with Others                                                    Hours with caregiver: Wife is available 24/7  [x] Home is accessible to patient            Stairs  [] Yes []  No     Ramp [] Yes [] No Comments:  Level entry, bedroom and bathroom on main floor    COMMUNITY ADL: TRANSPORTATION: [] Car    [x] Van    [] Public Transportation    [] Adapted w/c Lift   [] Ambulance   [] Other:       [] Sits in wheelchair during transport  Employment/School:   Retired  Specific requirements pertaining to mobility  Other:                                       FUNCTIONAL/SENSORY PROCESSING SKILLS:  Handedness:   [] Right     [x] Left    [] NA  Comments:                                 Functional Processing Skills for Wheeled Mobility [x] Processing Skills are adequate for safe wheelchair operation  Areas of concern than may interfere with safe operation of wheelchair Description of problem   []  Attention to environment     [] Judgment     []  Hearing  []  Vision or visual processing    [] Motor Planning  []  Fluctuations in Behavior         He is blind in R eye, since 1950,  But has no difficulty with depth perception or operating equipment;                                           VERBAL COMMUNICATION: [x] WFL receptive [x]  WFL  expressive [] Understandable  [] Difficult to understand  [] non-communicative []  Uses an augmented communication device    CURRENT SEATING / MOBILITY: Current Mobility Base:   [x] None  [] Dependent  [] Manual  [] Scooter  [] Power   Type of Control:                       Manufacturer:                         Size:                         Age:                           Current Condition of Mobility Base:                                                                                                                     Current Wheelchair components:  Describe posture in present seating system:                                                                            SENSATION and SKIN ISSUES: Sensation [] Intact [x] Impaired [] Absent   Level of sensation:   Loss of protective sensation (5.07 monofilament) in BLE feet;                         Pressure Relief: Able to perform effective pressure relief :   [x] Yes  []  No Method:     Transfers/stand                                                                         If not, Why?:                                                                          Skin Issues/Skin Integrity Current Skin Issues   [] Yes [x] No  [x] Intact []  Red area []  Open Area  [] Scar Tissue [] At risk from prolonged sitting  Where                              History of Skin Issues   [] Yes [x] No  Where                                         When                                               Hx of skin flap surgeries [] Yes [x] No  Where                  When                                                  Limited sitting tolerance [] Yes [x] No Hours spent sitting in wheelchair daily:    NA                                                     Complaint of Pain:  Please  describe:    None                                                                                                          Swelling/Edema:  Does have swelling in RLE lower leg, has been present for a while does not fluctuate;  Does not change with prolonged sitting;                                                                                                                                                ADL STATUS (in reference to wheelchair use):  Indep Assist Unable Indep with Equip Not assessed Comments  Dressing     X                                                                    Eating     X                                                                                                                         Toileting    X  Bathing     X                                                            Uses a shower seat                                                                     Grooming/ Hygiene    X                                                                                                                          Meal Prep                  X                                                                                                        IADLS                                                                                                                  Bowel Management: [x] Continent  [] Incontinent  [] Accidents Comments:                                                  Bladder Management: [x] Continent  [] Incontinent  [] Accidents Comments:  WHEELCHAIR SKILLS: Manual w/c Propulsion: [] UE or LE strength and endurance sufficient to participate in ADLs using manual wheelchair Arm :  [] left [] right  [x] Both                                   Foot:   [] left [] right  [] Both  Distance: 50 feet, short of breath  Operate Scooter: [x]  Strength, hand grip, balance and  transfer appropriate for use [x] Living environment is accessible for use of scooter  Operate Power w/c:  []  Std. Joystick   []  Alternative Controls Indep []  Assist []  Dependent/ Unable []  N/A []  [] Safe          []  Functional      Distance:                Bed confined without wheelchair []  Yes [x]  No   STRENGTH/RANGE OF MOTION:  Range of Motion Strength  Shoulder             WFL                                                  WFL                                              Elbow             WFL                                                  WFL                                                 Wrist/Hand              WFL                                                              Grip: R: 80#, L: 85#                                                             Hip          [facility]  4-/5                                                Knee          WFL                                                                  4-/5                                                  Ankle Limited due to weakness              3-/5                                                         MOBILITY/BALANCE:  []  Patient is totally dependent for mobility                                                                                               Balance Transfers Ambulation  Sitting Balance: Standing Balance: []  Independent []  Independent/Modified Independent  [x]  WFL     []  WFL [x]  Supervision []  Supervision  []  Uses UE for balance  []  Supervision []  Min Assist [x]  Ambulates with Assist , CGA to min A                          []  Min Assist [x]  Min assist, requires HHA at all times []  Mod Assist [x]  Ambulates with Device:  []  RW   []  StW   [x]  Cane   [x]    Walking stick             []  Mod Assist []  Mod assist []  Max assist   []  Max Assist []  Max assist []  Dependent []  Indep. Short Distance Only  []  Unable []  Unable []  Lift / Sling  Required Distance (in feet)    50-100 feet with walking stick/shortness of breath                         []  Sliding board []  Unable to Ambulate: (Explain:  Cardio Status:  [] Intact  [x]  Impaired   []  NA   , has HTN                           Respiratory Status:  [x] Intact   []   Impaired   [] NA             Does have shortness of breath; However following gait, SPO2 98%, HR 79 bpm                        Orthotics/Prosthetics:         Does not have any orthotics at this time;                                                                 Comments (Address manual vs power w/c vs scooter):   Patient is able to propel self in a manual wheelchair with BUE pushing. He is safe to lock/unlock chair. However after propelling self 50 feet, he did become short of breath; Vitals assessed, SPO2 98%, HR 82, BP: 166/61. Patient would be safe to operate a scooter. He is limited in standing balance and is not a safe functional ambulator. Patient ambulates short distance (10 meter) with walking stick, CGA for safety. His speed was 0.7 m/s with walking stick indicating limited home ambulator, high risk for falls. When walking he exhibits increased foot slap/drag with short step length and unsteady gait pattern, requiring min A for safety. His home is equipped to handle a scooter throughout incuding an accessible bathroom. Patient would benefit from scooter use to improve safety within his home to reduce falls and conserve energy for better mobility and increased independence.                                                            Anterior / Posterior Obliquity Rotation-Pelvis  PELVIS    [x] Neutral  []  Posterior  []  Anterior     [x] WFL  [] Right Elevated  [] Left Elevated   [x] WFL  [] Right Anterior []   Left Anterior    []  Fixed []  Partly Flexible [x]  Flexible  []  Other  []  Fixed  []  Partly Flexible  []  Flexible []  Other  []  Fixed  []  Partly Flexible  []  Flexible []  Other  TRUNK  [x] WFL [] Thoracic Kyphosis [] Lumbar Lordosis   [x]  WFL [] Convex Right [] Convex Left   [] c-curve [] s-curve [] multiple  [x]  Neutral []  Left-anterior []  Right-anterior    []  Fixed []  Flexible []  Partly Flexible       Other  []  Fixed []  Flexible []  Partly Flexible []  Other  []  Fixed           []  Flexible []  Partly Flexible []  Other   Position Windswept   HIPS  [x]  Neutral []  Abduct []  ADduct [x]  Neutral []  Right []  Left       []  Fixed  []  Partly Flexible             []  Dislocated [x]  Flexible []  Subluxed    []  Fixed []  Partly Flexible  [x]  Flexible []  Other              Foot Positioning Knee Positioning   Knees and  Feet  [x]  WFL [] Left [] Right [x]  WFL [] Left [] Right   KNEES ROM concerns: ROM concerns:   &  [place]-Flexed                    [] Lt [] Rt                                 FEET Plantar Flexed                  [] Lt [] Rt     Inversion                    [] Lt [] Rt     Eversion                    [] Lt [] Rt    HEAD [x]  Functional [x]  Good Head Control   & []  Flexed         []  Extended []  Adequate Head Control   NECK []  Rotated  Lt  []  Lat Flexed Lt []  Rotated  Rt []  Lat Flexed Rt []  Limited Head Control    []  Cervical Hyperextension []  Absent  Head Control    SHOULDERS ELBOWS WRIST& HAND         Left     Right    Left     Right  U/E [x] Functional  Left            [x] Functional  Right                                 [] Fisting             [] Fisting     [] elevated Left [] depressed  Left [] elevated Right [] depressed  Right      [] protracted Left [] retracted Left [] protracted Right [] retracted Right [] subluxed  Left              [] subluxed  Right         Goals for Wheelchair Mobility  [x]  Independence with mobility in the home with motor related ADLs (MRADLs)  [x]  Independence with MRADLs in the community []  Provide dependent mobility  []  Provide recline     [] Provide tilt   Goals for Seating system []  Optimize pressure  distribution [x]  Provide support needed to facilitate function or safety []  Provide corrective forces to assist with maintaining or improving posture []  Accommodate client's posture: current seated postures and positions are not flexible or will not tolerate corrective forces [x]  Client to be independent with relieving pressure in the wheelchair [] Enhance physiological function such as breathing, swallowing, digestion  Simulation ideas/Equipment trials:   Pt propelled self in manual wheelchair approximately 50 feet on linoleum and carpet, independently with increased fatigue and shortness of breath reported, SPo2 98%, HR 82, BP: 166/61;                                                                                              State why other equipment was unsuccessful:       He is unable to propel self in wheelchair a functional distance for home  use due to shortness of breath and fatigue. U                                                                          MOBILITY BASE RECOMMENDATIONS and JUSTIFICATION: MOBILITY COMPONENT JUSTIFICATION  Manufacturer:           Model:              Size: Width           Seat Depth             [] provide transport from point A to B [] promote Indep mobility  [] is not a safe, functional ambulator [] walker or cane inadequate [] non-standard width/depth necessary to accommodate anatomical measurement []                             [] Manual Mobility Base [] non-functional ambulator    [x] Scooter/POV  [x] can safely operate  [x] can safely transfer   [x] has adequate trunk stability  [x] cannot functionally propel manual w/c  [] Power Mobility Base  [] non-ambulatory  [] cannot functionally propel manual wheelchair  []  cannot functionally and safely operate scooter/POV [] can safely operate and willing to  [] Stroller Base [] infant/child  [] unable to propel manual wheelchair [] allows for growth [] non-functional ambulator [] non-functional UE [] Indep mobility  is not a goal at this time  [] Tilt  [] Forward                   [] Backward                  [] Powered tilt              [] Manual tilt  [] change position against gravitational force on head and shoulders  [] change position for pressure relief/cannot weight shift [] transfers  [] management of tone [] rest periods [] control edema [] facilitate postural control  []                                       [] Recline  [] Power recline on power base [] Manual recline on manual base  [] accommodate femur to back angle  [] bring to full recline for ADL care  [] change position for pressure relief/cannot weight shift [] rest periods [] repositioning for transfers or clothing/diaper /catheter changes [] head positioning  [] Lighter weight required [] self- propulsion  [] lifting []                                                 [] Heavy Duty required [] user weight greater than 250# [] extreme tone/ over active movement [] broken frame on previous chair []                                     []  Back  []  Angle Adjustable []  Custom molded                           [] postural control [] control of  tone/spasticity [] accommodation of range of motion [] UE functional control [] accommodation for seating system []                                          [] provide lateral trunk support [] accommodate deformity [] provide posterior trunk support [] provide lumbar/sacral support [] support trunk in midline [] Pressure relief over spinal processes  []  Seat Cushion                       [] impaired sensation  [] decubitus ulcers present [] history of pressure ulceration [] prevent pelvic extension [] low maintenance  [] stabilize pelvis  [] accommodate obliquity [] accommodate multiple deformity [] neutralize lower extremity position [] increase pressure distribution []                                           []  Pelvic/thigh support  []  Lateral thigh guide []  Distal medial pad  []  Distal lateral pad []  pelvis in  neutral [] accommodate pelvis []  position upper legs []  alignment []  accommodate ROM []  decrease adduction [] accommodate tone [] removable for transfers [] decrease abduction  []  Lateral trunk Supports []  Lt     []  Rt [] decrease lateral trunk leaning [] control tone [] contour for increased contact [] safety  [] accommodate asymmetry []                                                 []  Mounting hardware  [] lateral trunk supports  [] back   [] seat [] headrest      []  thigh support [] fixed   [] swing away [] attach seat platform/cushion to w/c frame [] attach back cushion to w/c frame [] mount postural supports [] mount headrest  [] swing medial thigh support away [] swing lateral supports away for transfers  []                                                     Armrests  [] fixed [] adjustable height [] removable   [] swing away  [] flip back   [] reclining [] full length pads [] desk    [] pads tubular  [] provide support with elbow at 90   [] provide support for w/c tray [] change of height/angles for variable activities [] remove for transfers [] allow to come closer to table top [] remove for access to tables []                                               Hangers/ Leg rests  [] 60 [] 70 [] 90 [] elevating [] heavy duty  [] articulating [] fixed [] lift off [] swing away     [] power [] provide LE support  [] accommodate to hamstring tightness [] elevate legs during recline   [] provide change in position for Legs [] Maintain placement of feet on footplate [] durability [] enable transfers [] decrease edema [] Accommodate lower leg length []   Foot support Footplate    [] Lt  []  Rt  []  Center mount [] flip up                            [] depth/angle adjustable [] Amputee adapter    []  Lt     []  Rt [] provide foot support [] accommodate to ankle ROM [] transfers [] Provide support for residual extremity []  allow foot to go under wheelchair base []  decrease tone  []                                                  []  Ankle strap/heel loops [] support foot on foot support [] decrease extraneous movement [] provide input to heel  [] protect foot  Tires: [] pneumatic  [] flat free inserts  [] solid  [] decrease maintenance  [] prevent frequent flats [] increase shock absorbency [] decrease pain from road shock [] decrease spasms from road shock []                                              []  Headrest  [] provide posterior head support [] provide posterior neck support [] provide lateral head support [] provide anterior head support [] support during tilt and recline [] improve feeding   [] improve respiration [] placement of switches [] safety  [] accommodate ROM  [] accommodate tone [] improve visual orientation  []  Anterior chest strap []  Vest []  Shoulder retractors  [] decrease forward movement of shoulder [] accommodation of TLSO [] decrease forward movement of trunk [] decrease shoulder elevation [] added abdominal support [] alignment [] assistance with shoulder control  []                                               Pelvic Positioner [] Belt [] SubASIS bar [] Dual Pull [] stabilize tone [] decrease falling out of chair/ **will not Decrease potential for sliding due to pelvic tilting [] prevent excessive rotation [] pad for protection over boney prominence [] prominence comfort [] special pull angle to control rotation []                                                  Upper ExtremitySupport  [] L   []  R [] Arm trough   [] hand support []  tray       [] full tray [] swivel mount [] decrease edema      [] decrease subluxation   [] control tone   [] placement for AAC/Computer/EADL [] decrease gravitational pull on shoulders [] provide midline positioning [] provide support to increase UE function [] provide hand support in natural position [] provide work surface   POWER WHEELCHAIR CONTROLS  [] Proportional  [] Non-Proportional Type                                      [] Left   [] Right [] provides access for controlling wheelchair   [] lacks motor control to operate proportional drive control [] unable to understand proportional controls  Actuator Control Module  [] Single  [] Multiple   [] Allow the client to operate the power seat function(s) through the joystick  control   [] Safety Reset Switches [] Used to change modes and stop the wheelchair when driving in latch mode    [] Upgraded Electronics   [] programming for accurate control [] progressive Disease/changing condition [] non-proportional drive control needed [] Needed in order to operate power seat functions through joystick control   [] Display box [] Allows user to see in which mode and drive the wheelchair is set  [] necessary for alternate controls    [] Digital interface electronics [] Allows w/c to operate when using alternative drive controls  [] ASL Head Array [] Allows client to operate wheelchair  through switches placed in tri-panel headrest  [] Sip and puff with tubing kit [] needed to operate sip and puff drive controls  [] Upgraded tracking electronics [] increase safety when driving [] correct tracking when on uneven surfaces  [] Mount for switches or joystick [] Attaches switches to w/c  [] Swing away for access or transfers [] midline for optimal placement [] provides for consistent access  [] Attendant controlled joystick plus mount [] safety [] long distance driving [] operation of seat functions [] compliance with transportation regulations []                                             Rear wheel placement/Axle adjustability [] None [] semi adjustable [] fully adjustable  [] improved UE access to wheels [] improved stability [] changing angle in space for improvement of postural stability [] 1-arm drive access [] amputee pad placement []                                Wheel rims/ hand rims  [] metal   [] plastic coated [] oblique projections           [] vertical projections [] Provide ability to propel manual  wheelchair  []  Increase self-propulsion with hand weakness/decreased grasp  Push handles [] extended   [] angle adjustable              [] standard [] caregiver access [] caregiver assist [] allows "hooking" to enable increased ability to perform ADLs or maintain balance  One armed device   [] Lt   [] Rt [] enable propulsion of manual wheelchair with one arm   []                                            Brake/wheel lock extension []  Lt   []  Rt [] increase indep in applying wheel locks   [] Side guards [] prevent clothing getting caught in wheel or becoming soiled []  prevent skin tears/abrasions  Battery:                                            [x] to power wheelchair                                                         Other:  The above equipment has a life- long use expectancy. Growth and changes in medical and/or functional conditions would be the exceptions. This is to certify that the therapist has no financial relationship with durable medical provider or manufacturer. The therapist will not receive remuneration of any kind for the equipment recommended in this evaluation.   Patient has mobility limitation that significantly impairs safe, timely participation in one or more mobility related ADL's. (bathing, toileting, feeding, dressing, grooming, moving from room to room)  [x]  Yes []  No  Will mobility device sufficiently improve ability to participate and/or be aided in participation of MRADL's?      [x]  Yes []  No  Can limitation be compensated for with use of a cane or walker?                                    []  Yes [x]  No  Does patient or caregiver demonstrate ability/potential ability & willingness to safely use the mobility device?    [x]  Yes []  No  Does patient's home environment support use of recommended mobility device?            [x]  Yes []  No  Does patient have  sufficient upper extremity function necessary to functionally propel a manual wheelchair?     [x]  Yes []  No  Does patient have sufficient strength and trunk stability to safely operate a POV (scooter)?                                  [x]  Yes []  No  Does patient need additional features/benefits provided by a power wheelchair for MRADL's in the home?        []  Yes [x]  No  Does the patient demonstrate the ability to safely use a power wheelchair?                   [x]  Yes []  No     Physician's Name Printed:     Valera [place], MD                                                   Physician's Signature:  Date:     This is to certify that I, the above signed therapist have the following affiliations: []  This DME provider []  Manufacturer of recommended equipment []  Patient's long term care facility [x]  None of the above  Therapist Name/Signature:                                            Date: 06/08/19               Objective measurements completed on examination: See above findings.              PT Education - 06/08/19 0756    Education Details  recommendations    Person(s) Educated  Patient    Methods  Explanation    Comprehension  Verbalized understanding          PT Long Term Goals - 06/08/19 0913      PT LONG TERM GOAL #1  Title  Pt and caregivers will understand PT recommendation and appropriate/safe use for wheelchair and seating for home use.    Time  1    Period  Days    Status  New    Target Date  06/08/19             Plan - 06/08/19 0911    Clinical Impression Statement  79 yo Male with diabetes with BLE neuropathy presents to therapy seeking a scooter for increased safety with mobility. He is not a safe functional ambulator due to imbalance and weakness in BLE. He would benefit from scooter for increased safety in his home. See objective measures.    Personal Factors and Comorbidities  Comorbidity 3+;Fitness;Time since  onset of injury/illness/exacerbation    Comorbidities  Diabetes with neuropathy, CKD III, HTN (uncontrolled), anemia due to unknown bleed    Examination-Activity Limitations  Caring for Others;Carry;Locomotion Level;Squat;Stairs;Stand;Transfers    Examination-Participation Restrictions  Church;Cleaning;Community Activity;Driving;Laundry;Meal Prep;Shop;Volunteer;Yard Work    Stability/Clinical Decision Making  Evolving/Moderate complexity    Clinical Decision Making  Moderate    Rehab Potential  Fair    PT Frequency  One time visit    PT Treatment/Interventions  Energy conservation;Wheelchair mobility training    Consulted and Agree with Plan of Care  Patient;Family member/caregiver    Family Member Consulted  spouse       Patient will benefit from skilled therapeutic intervention in order to improve the following deficits and impairments:  Difficulty walking  Visit Diagnosis: Unsteadiness on feet  Muscle weakness (generalized)     Problem List Patient Active Problem List   Diagnosis Date Noted  . 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 Chronic kidney disease 05/11/2019  . Hyperlipidemia 05/11/2019  . Hypertension, 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  . Anemia due to vitamin B12 deficiency 05/05/2019  . Benign prostatic hyperplasia 05/05/2019  . Acute lower GI bleeding 04/27/2019  . Acute blood loss anemia 04/27/2019  . Acute renal failure superimposed on stage 3 chronic kidney disease ([place]) 04/27/2019  . Iron deficiency anemia due to chronic blood loss 04/27/2019  . Type 2 diabetes mellitus without complication, without long-term current use of insulin ([place]) 10/16/2015  . Benign essential hypertension 10/16/2015  . 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[place]) 12/06/2013  . Atherosclerosis 09/29/2012  . Cholelithiasis 09/29/2012  . Nephrolithiasis 09/29/2012  . Diastasis recti 08/23/2012  . Diabetic peripheral neuropathy associated with type 2 diabetes mellitus ([place]) 02/20/2011  . Heart murmur, systolic  83/38/2505  . Loss of feeling or sensation 02/20/2011  . Obesity 02/20/2011    Dutch Ing PT, DPT 06/08/2019, 9:14 AM  [facility] [address] [place], [place], [address] Phone: [phone]   Fax:  [phone]  Name: Gregory Dixon MRN: [id number] Date of Birth: March 17, 1941

## 2019-06-09 ENCOUNTER — Other Ambulatory Visit: Payer: Self-pay

## 2019-06-09 ENCOUNTER — Inpatient Hospital Stay: Payer: Medicare Other | Attending: Oncology

## 2019-06-09 VITALS — BP 166/70 | HR 62 | Temp 97.0°F | Resp 18

## 2019-06-09 DIAGNOSIS — Z79899 Other long term (current) drug therapy: Secondary | ICD-10-CM | POA: Diagnosis not present

## 2019-06-09 DIAGNOSIS — R5383 Other fatigue: Secondary | ICD-10-CM | POA: Diagnosis not present

## 2019-06-09 DIAGNOSIS — R531 Weakness: Secondary | ICD-10-CM | POA: Diagnosis not present

## 2019-06-09 DIAGNOSIS — D500 Iron deficiency anemia secondary to blood loss (chronic): Secondary | ICD-10-CM

## 2019-06-09 DIAGNOSIS — Z86010 Personal history of colonic polyps: Secondary | ICD-10-CM | POA: Insufficient documentation

## 2019-06-09 DIAGNOSIS — K922 Gastrointestinal hemorrhage, unspecified: Secondary | ICD-10-CM | POA: Diagnosis not present

## 2019-06-09 MED ORDER — IRON SUCROSE 20 MG/ML IV SOLN
200.0000 mg | Freq: Once | INTRAVENOUS | Status: AC
  Administered 2019-06-09: 200 mg via INTRAVENOUS
  Filled 2019-06-09: qty 10

## 2019-06-09 MED ORDER — SODIUM CHLORIDE 0.9 % IV SOLN
Freq: Once | INTRAVENOUS | Status: AC
  Filled 2019-06-09: qty 250

## 2019-06-10 ENCOUNTER — Other Ambulatory Visit: Payer: Self-pay | Admitting: Oncology

## 2019-06-10 DIAGNOSIS — D500 Iron deficiency anemia secondary to blood loss (chronic): Secondary | ICD-10-CM

## 2019-06-13 ENCOUNTER — Telehealth: Payer: Self-pay

## 2019-06-13 ENCOUNTER — Other Ambulatory Visit: Payer: Self-pay

## 2019-06-13 DIAGNOSIS — D519 Vitamin B12 deficiency anemia, unspecified: Secondary | ICD-10-CM

## 2019-06-13 DIAGNOSIS — D509 Iron deficiency anemia, unspecified: Secondary | ICD-10-CM

## 2019-06-13 NOTE — Telephone Encounter (Signed)
Spoke with pt and reminded him that he's due for labs this month ordered by Dr. Vicente Males. Pt plans to complete these labs when he goes to have labs collected for Dr. Grayland Ormond.

## 2019-06-13 NOTE — Telephone Encounter (Signed)
-----   Message from Rushie Chestnut, [place] sent at 05/18/2019 10:20 AM EST ----- Regarding: Pt due for labs Pt is due for Iron studies, B12, and Hemoglobin labs (release orders) this month

## 2019-06-28 ENCOUNTER — Ambulatory Visit (INDEPENDENT_AMBULATORY_CARE_PROVIDER_SITE_OTHER): Payer: Medicare Other | Admitting: Dermatology

## 2019-06-28 ENCOUNTER — Other Ambulatory Visit: Payer: Self-pay

## 2019-06-28 DIAGNOSIS — L578 Other skin changes due to chronic exposure to nonionizing radiation: Secondary | ICD-10-CM

## 2019-06-28 DIAGNOSIS — Z85828 Personal history of other malignant neoplasm of skin: Secondary | ICD-10-CM

## 2019-06-28 NOTE — Progress Notes (Signed)
   Follow-Up Visit   Subjective  Gregory Dixon is a 79 y.o. male who presents for the following: recheck BCC site (R post ear - healing well, no longer tender).  The following portions of the chart were reviewed this encounter and updated as appropriate:     Review of Systems: No other skin or systemic complaints.  Objective  Well appearing patient in no apparent distress; mood and affect are within normal limits.  A focused examination was performed including R post ear. Relevant physical exam findings are noted in the Assessment and Plan.  Objective  Face: Diffuse scaly erythematous macules with underlying dyspigmentation.   Objective  Right post ear: Mostly epithelialized healing ulceration  Assessment & Plan  Actinic skin damage Face  Recommend broad spectrum SPF and photoprotection   History of basal cell carcinoma (BCC) Right post ear  Margins free -  The patient will observe these symptoms, and report promptly any worsening or unexpected persistence.  If well, may RTC in 3 months to recheck site.  Return in about 3 months (around 09/28/2019) for recheck [facility] site.   Tanja Port, MD, am acting as scribe for Sarina Ser, MD .

## 2019-06-28 NOTE — Progress Notes (Signed)
06/29/19 12:30 PM   Gregory Dixon 1940/09/26 [id number]  Referring provider: Valera Castle, [place] [place],  [place] [address]  Chief Complaint  Patient presents with  . Renal cyst    HPI: Gregory Dixon is a 79 y.o. white M with a hx of diabetes mellitus type 2, HTN, HLD, nephrolithiasis and CKD presents today for the evaluation and management of a renal cyst. He was referred to [place] by Valera [place], MD.   He is being followed by his nephrologist, Dr. Holley Raring, for chronic kidney disease stage IV.   He was followed up with a RUS by nephrologist on 05/24/19 which indicated bilateral renal cysts with large left cyst demonstrating a single septation.   Most recent creatinine 2.63, 06/10/19.   He reports of difficulties with urination including frequency q 2 hours, nocturia 1-2x, and variable urgency. He feels like he is not emptying his bladder all the way since he needs to use the restroom 10 minutes after urinating, however his PVR is 44 mL. He also notes of the need to stand while urinating. He states that his urinary symptoms are not bothersome enough for medication/intervention.   He denies flank pain and gross hematuria.   He reports of drinking 1/2-3/4 gallon of lemonade per day and Dr. Malachi Bonds once a week.    PMH: Past Medical History:  Diagnosis Date  . Actinic keratosis   . Basal cell carcinoma 10/21/2017   left lateral neck, excised 02/09/2018  . Basal cell carcinoma (BCC) 05/12/2019   right posterior ear, excised 05/31/2019  . Blood transfusion without reported diagnosis   . Diabetes mellitus without complication [facility])     Surgical History: Past Surgical History:  Procedure Laterality Date  . COLONOSCOPY WITH PROPOFOL N/A 04/29/2019   Procedure: COLONOSCOPY WITH PROPOFOL;  Surgeon: Jonathon Bellows, MD;  Location[facility];  Service: Gastroenterology;  Laterality: N/A;  . ESOPHAGOGASTRODUODENOSCOPY (EGD) WITH PROPOFOL N/A 04/29/2019   Procedure: ESOPHAGOGASTRODUODENOSCOPY (EGD) WITH PROPOFOL;  Surgeon: Jonathon Bellows, MD;  Location: [facility] ENDOSCOPY;  Service: Gastroenterology;  Laterality: N/A;    Home Medications:  Allergies as of 06/29/2019      Reactions   Aspirin Other (See Comments)   GI bleed   Codeine Nausea Only, Nausea And Vomiting      Medication List       Accurate as of June 29, 2019 12:30 PM. If you have any questions, ask your nurse or doctor.        carvedilol 6.25 MG tablet Commonly known as: COREG Take 1 tablet (6.25 mg total) by mouth 2 (two) times daily with a meal.   fenofibrate 160 MG tablet Take 160 mg by mouth daily.   ferrous sulfate 325 (65 FE) MG EC tablet Take 1 tablet (325 mg total) by mouth 2 (two) times daily.   hydrALAZINE 25 MG tablet Commonly known as: APRESOLINE Take 1 tablet (25 mg total) by mouth 2 (two) times daily.   isosorbide mononitrate 30 MG 24 hr tablet Commonly known as: IMDUR Take 30 mg by mouth every evening.   losartan 25 MG tablet Commonly known as: COZAAR Take 25 mg by mouth daily.   NovoLOG Mix 70/30 FlexPen (70-30) 100 UNIT/ML FlexPen Generic drug: insulin aspart protamine - aspart Inject 0.1 mLs (10 Units total) into the skin 2 (two) times daily.   olmesartan-hydrochlorothiazide 40-25 MG tablet Commonly known as: BENICAR HCT   terazosin 10 MG capsule Commonly known as: HYTRIN Take 10 mg by mouth every  evening.   vitamin B-12 1000 MCG tablet Commonly known as: CYANOCOBALAMIN Take 1 tablet (1,000 mcg total) by mouth daily.       Allergies:  Allergies  Allergen Reactions  . Aspirin Other (See Comments)    GI bleed  . Codeine Nausea Only and Nausea And Vomiting    Family History: No family history on file.  Social History:  reports that he has quit smoking. He has never used smokeless tobacco. He reports previous alcohol use. He reports that he does not use drugs.   Physical Exam: BP 101/62   Pulse 73   Ht 6\' 6"  (1.981 m)   Wt  271 lb (122.9 kg)   BMI 31.32 kg/m   Constitutional:  Alert and oriented, No acute distress. HEENT: [place] AT, moist mucus membranes.  Trachea midline, no masses. Cardiovascular: No clubbing, cyanosis, or edema. Respiratory: Normal respiratory effort, no increased work of breathing. Skin: No rashes, bruises or suspicious lesions. Neurologic: Grossly intact, no focal deficits, moving all 4 extremities. Psychiatric: Normal mood and affect.  Laboratory Data:  Lab Results  Component Value Date   HGBA1C 5.8 (H) 04/27/2019   Pertinent Imaging: Results for orders placed or performed in visit on 06/29/19  Bladder Scan (Post Void Residual) in office  Result Value Ref Range   Scan Result 44    Results for orders placed during the hospital encounter of 05/24/19  US RENAL   Narrative CLINICAL DATA:  Chronic renal disease  EXAM: RENAL / URINARY TRACT ULTRASOUND COMPLETE  COMPARISON:  None.  FINDINGS: Right Kidney:  Renal measurements: 11.7 x 5.6 x 5.1 cm. = volume: 176 mL. No mass lesion or hydronephrosis is noted. Scattered cysts are noted within the right kidney. The largest of these lies in the upper pole measuring 1.2 cm.  Left Kidney:  Renal measurements: 13.1 x 5.9 x 6.7 cm = volume: 274 mL. No hydronephrosis is noted. Scattered cysts are noted primarily in the upper pole. The largest of these measures 5.8 cm with some central septations.  Bladder:  Appears normal for degree of bladder distention.  Other:  None.  IMPRESSION: Bilateral renal cysts. The large cyst on the left demonstrates a single septation. Short-term follow-up in 6-12 months to assess for stability is recommended.  No other focal abnormality is noted.   Electronically Signed   By: Inez Catalina M.D.   On: 05/24/2019 21:15     I have personally reviewed the images and agree with radiologist interpretation. Compared to CT w/o contrast from 2014 which indicated same left upper pole cyst measuring  4.5 cm.   Assessment & Plan:    1. Renal cyst We discussed this in detail and in regards to the spectrum of renal masses which includes cysts (pure cysts are considered benign), solid masses and everything in between.   For cystic renal masses, we reviewed the Bosniak classification and discussed that Bosniak 3 lesions harbor a 50% chance of malignancy whereas Bosniak 4 cysts have a solid and 90-95% are malignant in nature.   Reviewed RUS, indicated Bosniak 2 lesion with slight interval growth since 2014 measuring approximately 1 cm   Typically will not folllow Bosniak 2 lesion, however given interval growth will follow up w/ RUS in 1 year   Malignant potential extremely low  F/u in 1 year with RUS  2. CKD Stage IV Followed by Dr. Holley Raring, nephrologist  Most recent creatinine 2.63, 06/10/19   3. BPH with lower urinary tract frequency  Adequate emptying  of bladder  PSA 1.18, 2018 Deferred PSA screening given age and comorbidities  Not interested in pharmacotherapy or intervention therapy at this time, however will reconsider if symptoms worsen   F/u 1 year with [facility] [address], [place], [place] [address] [phone]  I, Lucas Mallow, am acting as a scribe for Dr. Hollice Espy,  I have reviewed the above documentation for accuracy and completeness, and I agree with the above.   Hollice Espy, MD

## 2019-06-29 ENCOUNTER — Encounter: Payer: Self-pay | Admitting: Urology

## 2019-06-29 ENCOUNTER — Ambulatory Visit (INDEPENDENT_AMBULATORY_CARE_PROVIDER_SITE_OTHER): Payer: Medicare Other | Admitting: Urology

## 2019-06-29 VITALS — BP 101/62 | HR 73 | Ht 78.0 in | Wt 271.0 lb

## 2019-06-29 DIAGNOSIS — N281 Cyst of kidney, acquired: Secondary | ICD-10-CM | POA: Diagnosis not present

## 2019-06-29 DIAGNOSIS — N400 Enlarged prostate without lower urinary tract symptoms: Secondary | ICD-10-CM

## 2019-06-29 LAB — BLADDER SCAN AMB NON-IMAGING: Scan Result: 44

## 2019-06-30 ENCOUNTER — Telehealth: Payer: Self-pay | Admitting: *Deleted

## 2019-06-30 LAB — URINALYSIS, COMPLETE
Bilirubin, UA: NEGATIVE
Ketones, UA: NEGATIVE
Leukocytes,UA: NEGATIVE
Nitrite, UA: NEGATIVE
Specific Gravity, UA: 1.025 (ref 1.005–1.030)
Urobilinogen, Ur: 0.2 mg/dL (ref 0.2–1.0)
pH, UA: 5.5 (ref 5.0–7.5)

## 2019-06-30 LAB — MICROSCOPIC EXAMINATION: Bacteria, UA: NONE SEEN

## 2019-06-30 NOTE — Telephone Encounter (Addendum)
Patient informed, reviewed procedure in detail. Scheduled appointment. Voiced understanding.   ----- Message from Hollice Espy, MD sent at 06/30/2019  3:30 PM EDT ----- This patient had 3-10 red blood cells in his urine.  I did not get these results until today in my in basket.  I do think that we should probably work this up with a cystoscopy to evaluate his bladder to make sure that there is not any additional issues in his bladder.  Please schedule this.  If he has questions or concerns, please schedule a virtual visit to discuss.  Hollice Espy, MD

## 2019-07-05 ENCOUNTER — Inpatient Hospital Stay: Payer: Medicare Other

## 2019-07-05 ENCOUNTER — Other Ambulatory Visit: Payer: Self-pay

## 2019-07-05 DIAGNOSIS — D500 Iron deficiency anemia secondary to blood loss (chronic): Secondary | ICD-10-CM | POA: Diagnosis not present

## 2019-07-05 LAB — CBC WITH DIFFERENTIAL/PLATELET
Abs Immature Granulocytes: 0.02 10*3/uL (ref 0.00–0.07)
Basophils Absolute: 0 10*3/uL (ref 0.0–0.1)
Basophils Relative: 0 %
Eosinophils Absolute: 0.1 10*3/uL (ref 0.0–0.5)
Eosinophils Relative: 3 %
HCT: 27.1 % — ABNORMAL LOW (ref 39.0–52.0)
Hemoglobin: 8.8 g/dL — ABNORMAL LOW (ref 13.0–17.0)
Immature Granulocytes: 1 %
Lymphocytes Relative: 24 %
Lymphs Abs: 0.8 10*3/uL (ref 0.7–4.0)
MCH: 28.3 pg (ref 26.0–34.0)
MCHC: 32.5 g/dL (ref 30.0–36.0)
MCV: 87.1 fL (ref 80.0–100.0)
Monocytes Absolute: 0.2 10*3/uL (ref 0.1–1.0)
Monocytes Relative: 7 %
Neutro Abs: 2.1 10*3/uL (ref 1.7–7.7)
Neutrophils Relative %: 65 %
Platelets: 150 10*3/uL (ref 150–400)
RBC: 3.11 MIL/uL — ABNORMAL LOW (ref 4.22–5.81)
RDW: 14.9 % (ref 11.5–15.5)
WBC: 3.3 10*3/uL — ABNORMAL LOW (ref 4.0–10.5)
nRBC: 0 % (ref 0.0–0.2)

## 2019-07-05 LAB — BASIC METABOLIC PANEL
Anion gap: 7 (ref 5–15)
BUN: 40 mg/dL — ABNORMAL HIGH (ref 8–23)
CO2: 24 mmol/L (ref 22–32)
Calcium: 8.6 mg/dL — ABNORMAL LOW (ref 8.9–10.3)
Chloride: 107 mmol/L (ref 98–111)
Creatinine, Ser: 3.19 mg/dL — ABNORMAL HIGH (ref 0.61–1.24)
GFR calc Af Amer: 20 mL/min — ABNORMAL LOW (ref >60)
GFR calc non Af Amer: 18 mL/min — ABNORMAL LOW (ref >60)
Glucose, Bld: 159 mg/dL — ABNORMAL HIGH (ref 70–99)
Potassium: 4.4 mmol/L (ref 3.5–5.1)
Sodium: 138 mmol/L (ref 135–145)

## 2019-07-05 LAB — IRON AND TIBC
Iron: 36 ug/dL — ABNORMAL LOW (ref 45–182)
Saturation Ratios: 11 % — ABNORMAL LOW (ref 17.9–39.5)
TIBC: 319 ug/dL (ref 250–450)
UIBC: 283 ug/dL

## 2019-07-05 LAB — FERRITIN: Ferritin: 47 ng/mL (ref 24–336)

## 2019-07-06 LAB — PROTEIN ELECTROPHORESIS, SERUM
A/G Ratio: 1.1 (ref 0.7–1.7)
Albumin ELP: 2.9 g/dL (ref 2.9–4.4)
Alpha-1-Globulin: 0.2 g/dL (ref 0.0–0.4)
Alpha-2-Globulin: 0.9 g/dL (ref 0.4–1.0)
Beta Globulin: 0.9 g/dL (ref 0.7–1.3)
Gamma Globulin: 0.6 g/dL (ref 0.4–1.8)
Globulin, Total: 2.6 g/dL (ref 2.2–3.9)
M-Spike, %: 0.2 g/dL — ABNORMAL HIGH
Total Protein ELP: 5.5 g/dL — ABNORMAL LOW (ref 6.0–8.5)

## 2019-07-06 LAB — IGG, IGA, IGM
IgA: 176 mg/dL (ref 61–437)
IgG (Immunoglobin G), Serum: 575 mg/dL — ABNORMAL LOW (ref 603–1613)
IgM (Immunoglobulin M), Srm: 178 mg/dL — ABNORMAL HIGH (ref 15–143)

## 2019-07-06 LAB — KAPPA/LAMBDA LIGHT CHAINS
Kappa free light chain: 105 mg/L — ABNORMAL HIGH (ref 3.3–19.4)
Kappa, lambda light chain ratio: 1.74 — ABNORMAL HIGH (ref 0.26–1.65)
Lambda free light chains: 60.4 mg/L — ABNORMAL HIGH (ref 5.7–26.3)

## 2019-07-07 NOTE — Progress Notes (Signed)
[place]  [phone] [phone]  ID: Gregory Dixon OB: Oct 29, 1940  MR#: [id number]  AOZ#:[id number]  Patient Care Team: Valera [place], MD as PCP - General (Family Medicine)  CHIEF COMPLAINT: Iron deficiency anemia.  INTERVAL HISTORY: Patient returns to clinic today for further evaluation, discussion of his laboratory results, and initiation of IV Venofer.  He continues to have chronic weakness and fatigue, but continues to improve each week. He has no neurologic complaints.  He denies any recent fevers.  He has a good appetite and denies weight loss.  He has no chest pain, shortness of breath, cough, or hemoptysis.  He denies any nausea, vomiting, constipation, or diarrhea.  He has bleeding from hemorrhoids, but denies any melena or hematochezia.  He has no urinary complaints.  Patient offers no further specific complaints today.  REVIEW OF SYSTEMS:   Review of Systems  Constitutional: Positive for malaise/fatigue. Negative for fever and weight loss.  Respiratory: Negative.  Negative for cough and hemoptysis.   Cardiovascular: Negative.  Negative for chest pain and leg swelling.  Gastrointestinal: Negative.  Negative for abdominal pain, blood in stool and melena.  Genitourinary: Negative.  Negative for hematuria.  Musculoskeletal: Negative.  Negative for back pain.  Skin: Negative.  Negative for rash.  Neurological: Positive for weakness. Negative for dizziness, focal weakness and headaches.  Psychiatric/Behavioral: Negative.  The patient is not nervous/anxious.     As per HPI. Otherwise, a complete review of systems is negative.  PAST MEDICAL HISTORY: Past Medical History:  Diagnosis Date  . Actinic keratosis   . Basal cell carcinoma 10/21/2017   left lateral neck, excised 02/09/2018  . Basal cell carcinoma (BCC) 05/12/2019   right posterior ear, excised 05/31/2019  . Blood transfusion without reported diagnosis   . Diabetes  mellitus without complication ([place])     PAST SURGICAL HISTORY: Past Surgical History:  Procedure Laterality Date  . COLONOSCOPY WITH PROPOFOL N/A 04/29/2019   Procedure: COLONOSCOPY WITH PROPOFOL;  Surgeon: Jonathon Bellows, MD;  Location: [facility] ENDOSCOPY;  Service: Gastroenterology;  Laterality: N/A;  . ESOPHAGOGASTRODUODENOSCOPY (EGD) WITH PROPOFOL N/A 04/29/2019   Procedure: ESOPHAGOGASTRODUODENOSCOPY (EGD) WITH PROPOFOL;  Surgeon: Jonathon Bellows, MD;  Location: [facility] ENDOSCOPY;  Service: Gastroenterology;  Laterality: N/A;    FAMILY HISTORY: History reviewed. No pertinent family history.  ADVANCED DIRECTIVES (Y/N):  N  HEALTH MAINTENANCE: Social History   Tobacco Use  . Smoking status: Former Research scientist (life sciences)  . Smokeless tobacco: Never Used  Substance Use Topics  . Alcohol use: Not Currently  . Drug use: Never     Colonoscopy:  PAP:  Bone density:  Lipid panel:  Allergies  Allergen Reactions  . Aspirin Other (See Comments)    GI bleed  . Codeine Nausea Only and Nausea And Vomiting    Current Outpatient Medications  Medication Sig Dispense Refill  . carvedilol (COREG) 6.25 MG tablet Take 1 tablet (6.25 mg total) by mouth 2 (two) times daily with a meal. 60 tablet 0  . fenofibrate 160 MG tablet Take 160 mg by mouth daily.    . ferrous sulfate 325 (65 FE) MG EC tablet Take 1 tablet (325 mg total) by mouth 2 (two) times daily. 60 tablet 0  . hydrALAZINE (APRESOLINE) 25 MG tablet Take 1 tablet (25 mg total) by mouth 2 (two) times daily. 60 tablet 0  . isosorbide mononitrate (IMDUR) 30 MG 24 hr tablet Take 30 mg by mouth every evening.     Marland Kitchen losartan (COZAAR) 25 MG  tablet Take 25 mg by mouth daily.    . Multiple Vitamin (MULTIVITAMIN) capsule Take 1 capsule by mouth daily.    Marland Kitchen NOVOLOG MIX 70/30 FLEXPEN (70-30) 100 UNIT/ML FlexPen Inject 0.1 mLs (10 Units total) into the skin 2 (two) times daily. 15 mL 11  . olmesartan-hydrochlorothiazide (BENICAR HCT) 40-25 MG tablet     . terazosin  (HYTRIN) 10 MG capsule Take 10 mg by mouth every evening.     . vitamin B-12 (CYANOCOBALAMIN) 1000 MCG tablet Take 1 tablet (1,000 mcg total) by mouth daily. 30 tablet 0   No current facility-administered medications for this visit.    OBJECTIVE: Vitals:   07/12/19 1333  BP: (!) 177/73  Pulse: 60  Resp: 20  Temp: (!) 96.9 F (36.1 C)  SpO2: 100%     Body mass index is 29.64 kg/m.    ECOG FS:1 - Symptomatic but completely ambulatory  General: Well-developed, well-nourished, no acute distress. Eyes: Pink conjunctiva, anicteric sclera. HEENT: Normocephalic, moist mucous membranes. Lungs: No audible wheezing or coughing. Heart: Regular rate and rhythm. Abdomen: Soft, nontender, no obvious distention. Musculoskeletal: No edema, cyanosis, or clubbing. Neuro: Alert, answering all questions appropriately. Cranial nerves grossly intact. Skin: No rashes or petechiae noted. Psych: Normal affect.   LAB RESULTS:  Lab Results  Component Value Date   NA 138 07/05/2019   K 4.4 07/05/2019   CL 107 07/05/2019   CO2 24 07/05/2019   GLUCOSE 159 (H) 07/05/2019   BUN 40 (H) 07/05/2019   CREATININE 3.19 (H) 07/05/2019   CALCIUM 8.6 (L) 07/05/2019   PROT 5.9 (L) 04/27/2019   ALBUMIN 3.1 (L) 04/27/2019   AST 17 04/27/2019   ALT 13 04/27/2019   ALKPHOS 19 (L) 04/27/2019   BILITOT 0.5 04/27/2019   GFRNONAA 18 (L) 07/05/2019   GFRAA 20 (L) 07/05/2019    Lab Results  Component Value Date   WBC 3.3 (L) 07/05/2019   NEUTROABS 2.1 07/05/2019   HGB 8.8 (L) 07/05/2019   HCT 27.1 (L) 07/05/2019   MCV 87.1 07/05/2019   PLT 150 07/05/2019   Lab Results  Component Value Date   IRON 36 (L) 07/05/2019   TIBC 319 07/05/2019   IRONPCTSAT 11 (L) 07/05/2019   Lab Results  Component Value Date   FERRITIN 47 07/05/2019     STUDIES: No results found.  ASSESSMENT: Iron deficiency anemia, possibly GI source.  PLAN:    1.  Iron deficiency anemia: EGD, virtual endoscopy, and colonoscopy  in January 2021 were unrevealing, but 2 polyps were removed.  No malignancy was found, but recommendation was to repeat colonoscopy in 6 months in July 2021.  Patient received 3 units of packed red blood cells as well as 200 mg IV Venofer while in the hospital with improvement of his hemoglobin.  Despite recently receiving 3 additional doses of IV Venofer, patient's hemoglobin is essentially unchanged.  Iron stores remain mildly decreased, therefore will proceed with 200 mg IV Venofer today.  Patient will return to clinic in 1 week for second infusion.  He would then return to clinic in 2 months for repeat laboratory work and further evaluation.  At that point, if patient's iron stores are within normal limits can initiate Retacrit given his underlying chronic renal insufficiency. 2.  Chronic renal insufficiency: Patient's most recent creatinine is greater than 3.0.  Patient reports follow-up with nephrology in the near future.  He will benefit from Retacrit as above once his iron stores are repleted. 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 4.  Leukopenia: Mild, monitor.   Patient expressed understanding and was in agreement with this plan. He also understands that He can call clinic at any time with any questions, concerns, or complaints.    Lloyd Huger, MD   07/13/2019 12:26 PM

## 2019-07-11 ENCOUNTER — Other Ambulatory Visit: Payer: Self-pay

## 2019-07-11 ENCOUNTER — Encounter: Payer: Self-pay | Admitting: Oncology

## 2019-07-11 NOTE — Progress Notes (Signed)
Pt prescreened with assistance of wife. No questions or concerns. Wife reports he's been doing much better since the last time he saw Dr. Grayland Ormond.

## 2019-07-12 ENCOUNTER — Inpatient Hospital Stay: Payer: Medicare Other | Attending: Oncology | Admitting: Oncology

## 2019-07-12 ENCOUNTER — Inpatient Hospital Stay: Payer: Medicare Other

## 2019-07-12 ENCOUNTER — Other Ambulatory Visit: Payer: Medicare Other

## 2019-07-12 VITALS — BP 133/60 | HR 52 | Temp 97.2°F | Resp 18

## 2019-07-12 VITALS — BP 177/73 | HR 60 | Temp 96.9°F | Resp 20 | Wt 256.5 lb

## 2019-07-12 DIAGNOSIS — D500 Iron deficiency anemia secondary to blood loss (chronic): Secondary | ICD-10-CM

## 2019-07-12 DIAGNOSIS — D509 Iron deficiency anemia, unspecified: Secondary | ICD-10-CM | POA: Insufficient documentation

## 2019-07-12 DIAGNOSIS — D72819 Decreased white blood cell count, unspecified: Secondary | ICD-10-CM | POA: Diagnosis not present

## 2019-07-12 DIAGNOSIS — R5383 Other fatigue: Secondary | ICD-10-CM | POA: Insufficient documentation

## 2019-07-12 DIAGNOSIS — Z79899 Other long term (current) drug therapy: Secondary | ICD-10-CM | POA: Insufficient documentation

## 2019-07-12 DIAGNOSIS — Z85828 Personal history of other malignant neoplasm of skin: Secondary | ICD-10-CM | POA: Insufficient documentation

## 2019-07-12 DIAGNOSIS — Z87891 Personal history of nicotine dependence: Secondary | ICD-10-CM | POA: Diagnosis not present

## 2019-07-12 DIAGNOSIS — R531 Weakness: Secondary | ICD-10-CM | POA: Diagnosis not present

## 2019-07-12 DIAGNOSIS — D472 Monoclonal gammopathy: Secondary | ICD-10-CM | POA: Insufficient documentation

## 2019-07-12 DIAGNOSIS — N189 Chronic kidney disease, unspecified: Secondary | ICD-10-CM | POA: Diagnosis not present

## 2019-07-12 MED ORDER — IRON SUCROSE 20 MG/ML IV SOLN
200.0000 mg | Freq: Once | INTRAVENOUS | Status: AC
  Administered 2019-07-12: 15:00:00 200 mg via INTRAVENOUS

## 2019-07-12 MED ORDER — SODIUM CHLORIDE 0.9 % IV SOLN
Freq: Once | INTRAVENOUS | Status: AC
  Filled 2019-07-12: qty 250

## 2019-07-12 NOTE — Progress Notes (Signed)
Pt tolerated infusion well. Pt and VS stable at discharge.  

## 2019-07-20 ENCOUNTER — Other Ambulatory Visit: Payer: Self-pay

## 2019-07-20 ENCOUNTER — Inpatient Hospital Stay: Payer: Medicare Other

## 2019-07-20 VITALS — BP 142/66 | HR 62 | Temp 97.8°F | Resp 18

## 2019-07-20 DIAGNOSIS — D509 Iron deficiency anemia, unspecified: Secondary | ICD-10-CM | POA: Diagnosis not present

## 2019-07-20 DIAGNOSIS — D500 Iron deficiency anemia secondary to blood loss (chronic): Secondary | ICD-10-CM

## 2019-07-20 MED ORDER — SODIUM CHLORIDE 0.9 % IV SOLN
Freq: Once | INTRAVENOUS | Status: AC
  Filled 2019-07-20: qty 250

## 2019-07-20 MED ORDER — IRON SUCROSE 20 MG/ML IV SOLN
200.0000 mg | Freq: Once | INTRAVENOUS | Status: AC
  Administered 2019-07-20: 200 mg via INTRAVENOUS
  Filled 2019-07-20: qty 10

## 2019-07-20 NOTE — Progress Notes (Signed)
Pt tolerated infusion well. Pt and VS stable at discharge.  

## 2019-07-27 ENCOUNTER — Encounter: Payer: Self-pay | Admitting: Urology

## 2019-07-27 ENCOUNTER — Other Ambulatory Visit: Payer: Self-pay

## 2019-07-27 ENCOUNTER — Ambulatory Visit (INDEPENDENT_AMBULATORY_CARE_PROVIDER_SITE_OTHER): Payer: Medicare Other | Admitting: Urology

## 2019-07-27 VITALS — BP 172/66 | HR 77 | Ht 78.0 in | Wt 256.0 lb

## 2019-07-27 DIAGNOSIS — N400 Enlarged prostate without lower urinary tract symptoms: Secondary | ICD-10-CM

## 2019-07-27 NOTE — Progress Notes (Signed)
   07/27/19  CC:  Chief Complaint  Patient presents with  . Cysto    HPI: Gregory Dixon is a 79 y.o. M with a hx of diabetes mellitus type 2, HTN, HLD, nephrolithiasis and CKD returns today for a cysto for the evaluation and management of microscopic hematuria.   He is being followed by his nephrologist, Dr. Holley Raring, for chronic kidney disease stage IV.   He was followed up with a RUS by nephrologist on 05/24/19 which indicated bilateral renal cysts with large left cyst demonstrating a single septation.   In-office UA revealed 3-10 RBC on 06/29/19.   Recent RUS unremarkable.   Former smoker. He has a hx of smoking w/ 3-4 ppd. Quit 30 years ago.   Vitals:   07/27/19 1149  BP: (!) 172/66  Pulse: 77   NED. A&Ox3.   No respiratory distress   Abd soft, NT, ND Normal phallus with bilateral descended testicles  Cystoscopy Procedure Note  Patient identification was confirmed, informed consent was obtained, and patient was prepped using Betadine solution.  Lidocaine jelly was administered per urethral meatus.     Pre-Procedure: - Inspection reveals a normal caliber ureteral meatus.  Procedure: The flexible cystoscope was introduced without difficulty - No urethral strictures/lesions are present. - Enlarged prostate w/ bilobar copatation - Elevated bladder neck - Posterior wall diverticulum   - Bleeding upon manipulation w/ instrumentation  - Bilateral ureteral orifices identified - Bladder mucosa  reveals no ulcers, tumors, or lesions - No bladder stones - No trabeculation  Retroflexion shows no tumors.    Post-Procedure: - Patient tolerated the procedure well  Assessment/ Plan:  1.Renal cyst RUS unremarkable  Cysto today revealed no tumors.  Return in 1 year w/ RUS prior   2. Microscopic hematuria  High risk given hx of smoking  Unfortunately, in the setting of significant CKD, the patient cannot receive creatinine.  We discussed the risk and benefits of  pursuing upper tract imaging in the form of retrograde pyelograms to have a more thorough evaluation of his collecting systems/ureters.  We discussed the overall incidence of upper tract urothelial carcinoma which is relatively low. Ultimately, the patient understands the risk of possible missed diagnosis but elected to forego additional upper tract imaging   3. BPH with bladder outlet obstruction Cystoscopic findings consistent with significant bladder outlet obstruction patient continues to intervention for medical surgical management although he will benefit greatly, will continue to address this on subsequent follow-up visits   I, Nethusan Sivanesan, am acting as a scribe for Dr. Hollice Espy,  I have reviewed the above documentation for accuracy and completeness, and I agree with the above.   Hollice Espy, MD

## 2019-07-28 LAB — URINALYSIS, COMPLETE
Bilirubin, UA: NEGATIVE
Ketones, UA: NEGATIVE
Leukocytes,UA: NEGATIVE
Nitrite, UA: NEGATIVE
Specific Gravity, UA: 1.025 (ref 1.005–1.030)
Urobilinogen, Ur: 0.2 mg/dL (ref 0.2–1.0)
pH, UA: 7.5 (ref 5.0–7.5)

## 2019-07-28 LAB — MICROSCOPIC EXAMINATION: RBC, Urine: >30 /hpf — AB (ref 0–2)

## 2019-08-02 ENCOUNTER — Other Ambulatory Visit: Payer: Self-pay | Admitting: Urology

## 2019-08-22 ENCOUNTER — Telehealth: Payer: Self-pay | Admitting: Urology

## 2019-08-22 NOTE — Telephone Encounter (Signed)
This patient's urine cytology came back abnormal.  I would like to have a virtual visit with him to discuss options including further upper tract imaging/diagnostic procedure.  Please schedule virtual visit to discuss further.  Hollice Espy, MD

## 2019-08-23 NOTE — Telephone Encounter (Signed)
Patient informed, scheduled virtual appointment. Patient aware.

## 2019-08-30 NOTE — Progress Notes (Signed)
Virtual Visit via Video Note  I connected with Gregory Dixon on 08/31/19 at  1:15 PM EDT by a video enabled telemedicine application and verified that I am speaking with the correct person using two identifiers.  Location: Patient: home Provider: office   I discussed the limitations of evaluation and management by telemedicine and the availability of in person appointments. The patient expressed understanding and agreed to proceed.  History of Present Illness: Gregory Dixon is a 79 y.o. M with a hx of diabetes mellitus type 2, HTN, HLD, nephrolithiasis, CKD, and microscopic hematuria returns today for f/u via virtual visit to discuss abnormal urine cytology.  He is being followed by his nephrologist, Dr. Holley Raring, for chronic kidney disease stage IV.   He was followed up with a RUSby nephrologiston 2/16/21whichindicated bilateral renal cysts with large left cyst demonstrating a single septation.   In-office UA on previous visit revealed 3-10 RBC on 06/29/19. Recent RUS unremarkable.   Office cysto also unremarkable.  Urine cytology revealed dysplastic urothelial cells and dysmorphic erythrocytes indicating moderate glomerular and/or renal tubular bleeding.   He reports of gross hematuria.   Former smoker. He has a hx of smoking w/ 3-4 ppd. Quit 30 years ago.    Observations/Objective: Pt is engaged and asking good questions.   Assessment and Plan:  1. Abnormal urine cytology  Urine cytology revealed dysplastic urothelial cells  Discussed further upper tract imaging/diagnostic procedure- recommend to OR for bilateral retrogrades, selective cytology, possibly dx ureteroscopy w/ biopsy/ fulguration, random bladder biopsy  Risks and benefits discussed  He is very hesitant about wanting to proceed.  We discussed risk of undiagnosed, untreated bladder or upper tract malignancy along with the natural history of the diease.  He understands this and may still not want to pursue  further work.up despite this.  He will call back w/ decision.   2. CKD Stage IV Followed by Dr. Holley Raring, nephrologist  Most recent creatinine 2.63, 06/10/19   Follow Up Instructions: Book surgery vs. F/u in 3 months for repeat UA/ Urine cytology   I discussed the assessment and treatment plan with the patient. The patient was provided an opportunity to ask questions and all were answered. The patient agreed with the plan and demonstrated an understanding of the instructions.   The patient was advised to call back or seek an in-person evaluation if the symptoms worsen or if the condition fails to improve as anticipated.   Jamas Lav, am acting as a scribe for Dr. Hollice Espy,  I have reviewed the above documentation for accuracy and completeness, and I agree with the above.   Hollice Espy, MD

## 2019-08-31 ENCOUNTER — Other Ambulatory Visit: Payer: Self-pay

## 2019-08-31 ENCOUNTER — Telehealth (INDEPENDENT_AMBULATORY_CARE_PROVIDER_SITE_OTHER): Payer: Medicare Other | Admitting: Urology

## 2019-08-31 DIAGNOSIS — R8289 Other abnormal findings on cytological and histological examination of urine: Secondary | ICD-10-CM | POA: Diagnosis not present

## 2019-08-31 NOTE — Progress Notes (Signed)
This service is provided via telemedicine   No vital signs collected/recorded due to the encounter was a telemedicine visit.     Patient consents to a telephone visit:  Yes.    Names of all persons participating in the telemedicine service and their role in the encounter:  Kerman Passey, Hollice Espy, MD.

## 2019-09-02 ENCOUNTER — Encounter: Payer: Self-pay | Admitting: Urology

## 2019-09-02 ENCOUNTER — Telehealth: Payer: Self-pay | Admitting: Radiology

## 2019-09-02 NOTE — Telephone Encounter (Signed)
-----   Message from Hollice Espy, MD sent at 09/02/2019 10:12 AM EDT ----- Can you call this guy next week and see if he would like to proceed with surgery as outline in the note?  Strongly recommend this.  If not, f/u in 3 months with UA/ Urine cytology.

## 2019-09-02 NOTE — Telephone Encounter (Signed)
Patient would not like to proceed with surgery at this time. Appointment made for f/u in 3 months with UA/ Urine cytology.

## 2019-09-09 ENCOUNTER — Encounter: Payer: Self-pay | Admitting: Oncology

## 2019-09-09 ENCOUNTER — Other Ambulatory Visit: Payer: Self-pay

## 2019-09-09 DIAGNOSIS — D631 Anemia in chronic kidney disease: Secondary | ICD-10-CM | POA: Insufficient documentation

## 2019-09-09 DIAGNOSIS — N189 Chronic kidney disease, unspecified: Secondary | ICD-10-CM | POA: Insufficient documentation

## 2019-09-09 NOTE — Progress Notes (Signed)
[name]  [phone] [phone]) [phone]  ID: Gregory Dixon OB: [birth year]  MR#: [id number]  NAT#:[id number]  Patient Care Team: Valera Castle, MD as PCP - General (Family Medicine) Lloyd Huger, MD as Consulting Physician (Oncology)  CHIEF COMPLAINT: Anemia associated with chronic renal failure, iron deficiency anemia.  INTERVAL HISTORY: Patient returns to clinic today for repeat laboratory work and further evaluation.  He continues have chronic weakness and fatigue, but admits this has improved since receiving IV Venofer several months ago.  He otherwise feels well.  He has no neurologic complaints.  He denies any recent fevers or illnesses.  He has a good appetite and denies weight loss.  He has no chest pain, shortness of breath, cough, or hemoptysis.  He denies any nausea, vomiting, constipation, or diarrhea.  He denies any melena or hematochezia.  He has no urinary complaints.  Patient offers no further specific complaints today.  REVIEW OF SYSTEMS:   Review of Systems  Constitutional: Positive for malaise/fatigue. Negative for fever and weight loss.  Respiratory: Negative.  Negative for cough and hemoptysis.   Cardiovascular: Negative.  Negative for chest pain and leg swelling.  Gastrointestinal: Negative.  Negative for abdominal pain, blood in stool and melena.  Genitourinary: Negative.  Negative for hematuria.  Musculoskeletal: Negative.  Negative for back pain.  Skin: Negative.  Negative for rash.  Neurological: Positive for weakness. Negative for dizziness, focal weakness and headaches.  Psychiatric/Behavioral: Negative.  The patient is not nervous/anxious.     As per HPI. Otherwise, a complete review of systems is negative.  PAST MEDICAL HISTORY: Past Medical History:  Diagnosis Date  . Actinic keratosis   . Basal cell carcinoma 10/21/2017   left lateral neck, excised 02/09/2018  . Basal cell carcinoma (BCC) 05/12/2019    right posterior ear, excised 05/31/2019  . Blood transfusion without reported diagnosis   . Diabetes mellitus without complication ([place])     PAST SURGICAL HISTORY: Past Surgical History:  Procedure Laterality Date  . COLONOSCOPY WITH PROPOFOL N/A 04/29/2019   Procedure: COLONOSCOPY WITH PROPOFOL;  Surgeon: Jonathon Bellows, MD;  Location[facility];  Service: Gastroenterology;  Laterality: N/A;  . ESOPHAGOGASTRODUODENOSCOPY (EGD) WITH PROPOFOL N/A 04/29/2019   Procedure: ESOPHAGOGASTRODUODENOSCOPY (EGD) WITH PROPOFOL;  Surgeon: Jonathon Bellows, MD;  Location: [facility] ENDOSCOPY;  Service: Gastroenterology;  Laterality: N/A;    FAMILY HISTORY: History reviewed. No pertinent family history.  ADVANCED DIRECTIVES (Y/N):  N  HEALTH MAINTENANCE: Social History   Tobacco Use  . Smoking status: Former Research scientist (life sciences)  . Smokeless tobacco: Never Used  Substance Use Topics  . Alcohol use: Not Currently  . Drug use: Never     Colonoscopy:  PAP:  Bone density:  Lipid panel:  Allergies  Allergen Reactions  . Aspirin Other (See Comments)    GI bleed  . Codeine Nausea Only and Nausea And Vomiting    Current Outpatient Medications  Medication Sig Dispense Refill  . carvedilol (COREG) 25 MG tablet Take 25 mg by mouth 2 (two) times daily.    . fenofibrate 160 MG tablet Take 160 mg by mouth daily.    . ferrous sulfate 325 (65 FE) MG EC tablet Take 1 tablet (325 mg total) by mouth 2 (two) times daily. 60 tablet 0  . hydrALAZINE (APRESOLINE) 50 MG tablet Take by mouth.    . isosorbide mononitrate (IMDUR) 30 MG 24 hr tablet Take 30 mg by mouth every evening.     Marland Kitchen losartan (COZAAR) 25 MG tablet  Take 25 mg by mouth daily.    . Multiple Vitamin (MULTIVITAMIN) capsule Take 1 capsule by mouth daily.    Marland Kitchen NOVOLOG MIX 70/30 FLEXPEN (70-30) 100 UNIT/ML FlexPen Inject 0.1 mLs (10 Units total) into the skin 2 (two) times daily. 15 mL 11  . olmesartan-hydrochlorothiazide (BENICAR HCT) 40-25 MG tablet     .  terazosin (HYTRIN) 10 MG capsule Take 10 mg by mouth every evening.     . vitamin B-12 (CYANOCOBALAMIN) 1000 MCG tablet Take 1 tablet (1,000 mcg total) by mouth daily. 30 tablet 0   No current facility-administered medications for this visit.    OBJECTIVE: Vitals:   09/12/19 1417 09/12/19 1418  BP: (!) 185/72 (!) 172/54  Pulse: 67   Resp: 20   Temp: 97.7 F (36.5 C)   SpO2: 97%      Body mass index is 28.94 kg/m.    ECOG FS:1 - Symptomatic but completely ambulatory  General: Well-developed, well-nourished, no acute distress. Eyes: Pink conjunctiva, anicteric sclera. HEENT: Normocephalic, moist mucous membranes. Lungs: No audible wheezing or coughing. Heart: Regular rate and rhythm. Abdomen: Soft, nontender, no obvious distention. Musculoskeletal: No edema, cyanosis, or clubbing. Neuro: Alert, answering all questions appropriately. Cranial nerves grossly intact. Skin: No rashes or petechiae noted. Psych: Normal affect.   LAB RESULTS:  Lab Results  Component Value Date   NA 138 07/05/2019   K 4.4 07/05/2019   CL 107 07/05/2019   CO2 24 07/05/2019   GLUCOSE 159 (H) 07/05/2019   BUN 40 (H) 07/05/2019   CREATININE 3.19 (H) 07/05/2019   CALCIUM 8.6 (L) 07/05/2019   PROT 5.9 (L) 04/27/2019   ALBUMIN 3.1 (L) 04/27/2019   AST 17 04/27/2019   ALT 13 04/27/2019   ALKPHOS 19 (L) 04/27/2019   BILITOT 0.5 04/27/2019   GFRNONAA 18 (L) 07/05/2019   GFRAA 20 (L) 07/05/2019    Lab Results  Component Value Date   WBC 4.4 09/12/2019   NEUTROABS 3.2 09/12/2019   HGB 9.9 (L) 09/12/2019   HCT 30.3 (L) 09/12/2019   MCV 86.8 09/12/2019   PLT 152 09/12/2019   Lab Results  Component Value Date   IRON 28 (L) 09/12/2019   TIBC 361 09/12/2019   IRONPCTSAT 8 (L) 09/12/2019   Lab Results  Component Value Date   FERRITIN 27 09/12/2019     STUDIES: No results found.  ASSESSMENT: Anemia associated with chronic renal failure, iron deficiency anemia.  PLAN:    1.  Anemia  associated with chronic renal failure, iron deficiency anemia: EGD, virtual endoscopy, and colonoscopy in January 2021 were unrevealing, but 2 polyps were removed.  No malignancy was found, but recommendation was to repeat colonoscopy in 6 months in July 2021.  Patient's hemoglobin is slowly improving and is now 9.9.  His iron stores remain persistently low, therefore will return to clinic in 1 and 2 weeks to receive additional 200 mg IV Venofer.  He will then return to clinic in 3 months with repeat laboratory work, further evaluation, and continuation of treatment if needed.  If patient's iron stores are repleted, he may benefit from Retacrit in the future if his blood pressure is better controlled and hemoglobin remains below 10.0.  2.  Chronic renal insufficiency: Chronic and unchanged.  Patient's most recent creatinine is greater than 3.0.  Continue follow-up with nephrology as scheduled.  He will benefit from Retacrit as above once his iron stores are repleted. 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 4.  Leukopenia: Resolved.   Patient expressed understanding and was in agreement with this plan. He also understands that He can call clinic at any time with any questions, concerns, or complaints.    Lloyd Huger, MD   09/13/2019 10:09 AM

## 2019-09-09 NOTE — Progress Notes (Signed)
Patient prescreened for appointment. Patient has no concerns or questions.  

## 2019-09-12 ENCOUNTER — Inpatient Hospital Stay (HOSPITAL_BASED_OUTPATIENT_CLINIC_OR_DEPARTMENT_OTHER): Payer: Medicare Other | Admitting: Oncology

## 2019-09-12 ENCOUNTER — Encounter: Payer: Self-pay | Admitting: Oncology

## 2019-09-12 ENCOUNTER — Inpatient Hospital Stay: Payer: Medicare Other | Attending: Oncology

## 2019-09-12 ENCOUNTER — Inpatient Hospital Stay: Payer: Medicare Other

## 2019-09-12 ENCOUNTER — Other Ambulatory Visit: Payer: Self-pay

## 2019-09-12 DIAGNOSIS — Z794 Long term (current) use of insulin: Secondary | ICD-10-CM | POA: Insufficient documentation

## 2019-09-12 DIAGNOSIS — Z85828 Personal history of other malignant neoplasm of skin: Secondary | ICD-10-CM | POA: Diagnosis not present

## 2019-09-12 DIAGNOSIS — Z79899 Other long term (current) drug therapy: Secondary | ICD-10-CM | POA: Diagnosis not present

## 2019-09-12 DIAGNOSIS — Z886 Allergy status to analgesic agent status: Secondary | ICD-10-CM | POA: Insufficient documentation

## 2019-09-12 DIAGNOSIS — Z87891 Personal history of nicotine dependence: Secondary | ICD-10-CM | POA: Diagnosis not present

## 2019-09-12 DIAGNOSIS — R531 Weakness: Secondary | ICD-10-CM | POA: Insufficient documentation

## 2019-09-12 DIAGNOSIS — D472 Monoclonal gammopathy: Secondary | ICD-10-CM | POA: Insufficient documentation

## 2019-09-12 DIAGNOSIS — R5383 Other fatigue: Secondary | ICD-10-CM | POA: Insufficient documentation

## 2019-09-12 DIAGNOSIS — E611 Iron deficiency: Secondary | ICD-10-CM | POA: Diagnosis not present

## 2019-09-12 DIAGNOSIS — D631 Anemia in chronic kidney disease: Secondary | ICD-10-CM | POA: Insufficient documentation

## 2019-09-12 DIAGNOSIS — Z885 Allergy status to narcotic agent status: Secondary | ICD-10-CM | POA: Insufficient documentation

## 2019-09-12 DIAGNOSIS — E1122 Type 2 diabetes mellitus with diabetic chronic kidney disease: Secondary | ICD-10-CM | POA: Diagnosis not present

## 2019-09-12 DIAGNOSIS — N189 Chronic kidney disease, unspecified: Secondary | ICD-10-CM | POA: Insufficient documentation

## 2019-09-12 DIAGNOSIS — D500 Iron deficiency anemia secondary to blood loss (chronic): Secondary | ICD-10-CM

## 2019-09-12 LAB — CBC WITH DIFFERENTIAL/PLATELET
Abs Immature Granulocytes: 0.03 10*3/uL (ref 0.00–0.07)
Basophils Absolute: 0 10*3/uL (ref 0.0–0.1)
Basophils Relative: 0 %
Eosinophils Absolute: 0.1 10*3/uL (ref 0.0–0.5)
Eosinophils Relative: 2 %
HCT: 30.3 % — ABNORMAL LOW (ref 39.0–52.0)
Hemoglobin: 9.9 g/dL — ABNORMAL LOW (ref 13.0–17.0)
Immature Granulocytes: 1 %
Lymphocytes Relative: 18 %
Lymphs Abs: 0.8 10*3/uL (ref 0.7–4.0)
MCH: 28.4 pg (ref 26.0–34.0)
MCHC: 32.7 g/dL (ref 30.0–36.0)
MCV: 86.8 fL (ref 80.0–100.0)
Monocytes Absolute: 0.3 10*3/uL (ref 0.1–1.0)
Monocytes Relative: 7 %
Neutro Abs: 3.2 10*3/uL (ref 1.7–7.7)
Neutrophils Relative %: 72 %
Platelets: 152 10*3/uL (ref 150–400)
RBC: 3.49 MIL/uL — ABNORMAL LOW (ref 4.22–5.81)
RDW: 13.6 % (ref 11.5–15.5)
WBC: 4.4 10*3/uL (ref 4.0–10.5)
nRBC: 0 % (ref 0.0–0.2)

## 2019-09-12 LAB — IRON AND TIBC
Iron: 28 ug/dL — ABNORMAL LOW (ref 45–182)
Saturation Ratios: 8 % — ABNORMAL LOW (ref 17.9–39.5)
TIBC: 361 ug/dL (ref 250–450)
UIBC: 333 ug/dL

## 2019-09-12 LAB — FERRITIN: Ferritin: 27 ng/mL (ref 24–336)

## 2019-09-13 ENCOUNTER — Telehealth: Payer: Self-pay | Admitting: Emergency Medicine

## 2019-09-13 NOTE — Telephone Encounter (Signed)
Called patient to let him know that his iron stores are persistently low, and that in addition to previously scheduled follow up, he will need IV venofer next week and the week after. Pt verbalized understanding and didn't have any further questions or concerns.

## 2019-09-21 ENCOUNTER — Other Ambulatory Visit: Payer: Self-pay

## 2019-09-21 ENCOUNTER — Inpatient Hospital Stay: Payer: Medicare Other

## 2019-09-21 VITALS — BP 171/61 | HR 58 | Temp 96.3°F | Resp 18

## 2019-09-21 DIAGNOSIS — N189 Chronic kidney disease, unspecified: Secondary | ICD-10-CM | POA: Diagnosis not present

## 2019-09-21 DIAGNOSIS — D500 Iron deficiency anemia secondary to blood loss (chronic): Secondary | ICD-10-CM

## 2019-09-21 MED ORDER — SODIUM CHLORIDE 0.9 % IV SOLN
Freq: Once | INTRAVENOUS | Status: AC
  Filled 2019-09-21: qty 250

## 2019-09-21 MED ORDER — IRON SUCROSE 20 MG/ML IV SOLN
200.0000 mg | Freq: Once | INTRAVENOUS | Status: AC
  Administered 2019-09-21: 200 mg via INTRAVENOUS
  Filled 2019-09-21: qty 10

## 2019-09-21 NOTE — Progress Notes (Signed)
Pt tolerated infusion well. Pt and VS  Stable at discharge.

## 2019-09-26 ENCOUNTER — Telehealth: Payer: Self-pay | Admitting: Oncology

## 2019-09-26 NOTE — Telephone Encounter (Signed)
Patient phoned on this date and asked if his infusion appt on 09-30-19 could be moved to 09-29-19. Writer reviewed schedule and there appeared to not be availability on 09-29-19. Patient asked that if an opening became available to phone him to reschedule. Message sent to team scheduler to notify patient should an appt on 09-29-19 become available.

## 2019-09-28 ENCOUNTER — Inpatient Hospital Stay: Payer: Medicare Other

## 2019-09-29 ENCOUNTER — Other Ambulatory Visit: Payer: Self-pay

## 2019-09-29 ENCOUNTER — Ambulatory Visit (INDEPENDENT_AMBULATORY_CARE_PROVIDER_SITE_OTHER): Payer: Medicare Other | Admitting: Dermatology

## 2019-09-29 ENCOUNTER — Encounter: Payer: Self-pay | Admitting: Dermatology

## 2019-09-29 DIAGNOSIS — L820 Inflamed seborrheic keratosis: Secondary | ICD-10-CM

## 2019-09-29 DIAGNOSIS — L570 Actinic keratosis: Secondary | ICD-10-CM

## 2019-09-29 DIAGNOSIS — L821 Other seborrheic keratosis: Secondary | ICD-10-CM

## 2019-09-29 DIAGNOSIS — L578 Other skin changes due to chronic exposure to nonionizing radiation: Secondary | ICD-10-CM

## 2019-09-29 DIAGNOSIS — Z85828 Personal history of other malignant neoplasm of skin: Secondary | ICD-10-CM | POA: Diagnosis not present

## 2019-09-29 NOTE — Progress Notes (Signed)
   Follow-Up Visit   Subjective  Gregory Dixon is a 79 y.o. male who presents for the following: recheck [place] site (patient states that site is still sensitive to the touch) and lesions (on the chest that are irritated and irregular appearing).  The following portions of the chart were reviewed this encounter and updated as appropriate:  Tobacco  Allergies  Meds  Problems  Med Hx  Surg Hx  Fam Hx      Review of Systems:  No other skin or systemic complaints except as noted in HPI or Assessment and Plan.  Objective  Well appearing patient in no apparent distress; mood and affect are within normal limits.  A focused examination was performed including the chest, ears, hands, and face. Relevant physical exam findings are noted in the Assessment and Plan.  Objective  Chest x 2 (2): Erythematous keratotic or waxy stuck-on papule or plaque.   Objective  Forehead (9): Erythematous thin papules/macules with gritty scale.   Assessment & Plan    Inflamed seborrheic keratosis (2) Chest x 2  Hypertrophic and thick - will likely need more than one treatment, recheck at follow up appointment  Destruction of lesion - Chest x 2 Complexity: simple   Destruction method: cryotherapy   Informed consent: discussed and consent obtained   Timeout:  patient name, date of birth, surgical site, and procedure verified Lesion destroyed using liquid nitrogen: Yes   Region frozen until ice ball extended beyond lesion: Yes   Outcome: patient tolerated procedure well with no complications   Post-procedure details: wound care instructions given    AK (actinic keratosis) (9) Forehead  Destruction of lesion - Forehead Complexity: simple   Destruction method: cryotherapy   Informed consent: discussed and consent obtained   Timeout:  patient name, date of birth, surgical site, and procedure verified Lesion destroyed using liquid nitrogen: Yes   Region frozen until ice ball extended beyond  lesion: Yes   Outcome: patient tolerated procedure well with no complications   Post-procedure details: wound care instructions given     Seborrheic Keratoses - Stuck-on, waxy, tan-brown papules and plaques  - Discussed benign etiology and prognosis. - Observe - Call for any changes  Actinic Damage - diffuse scaly erythematous macules with underlying dyspigmentation - Recommend daily broad spectrum sunscreen SPF 30+ to sun-exposed areas, reapply every 2 hours as needed.  - Call for new or changing lesions.  History of Basal Cell Carcinoma of the Skin - No evidence of recurrence today - Recommend regular full body skin exams - Recommend daily broad spectrum sunscreen SPF 30+ to sun-exposed areas, reapply every 2 hours as needed.  - Call if any new or changing lesions are noted between office visits - Recommend Voltaren gel for sensitivity    Return in about 2 months (around 11/29/2019).  Luther Redo, CMA, am acting as scribe for Sarina Ser, MD .  Documentation: I have reviewed the above documentation for accuracy and completeness, and I agree with the above.  Sarina Ser, MD

## 2019-09-30 ENCOUNTER — Inpatient Hospital Stay: Payer: Medicare Other

## 2019-09-30 VITALS — BP 163/61 | HR 58 | Temp 97.0°F | Resp 20

## 2019-09-30 DIAGNOSIS — D500 Iron deficiency anemia secondary to blood loss (chronic): Secondary | ICD-10-CM

## 2019-09-30 DIAGNOSIS — N189 Chronic kidney disease, unspecified: Secondary | ICD-10-CM | POA: Diagnosis not present

## 2019-09-30 MED ORDER — SODIUM CHLORIDE 0.9 % IV SOLN
INTRAVENOUS | Status: DC
  Filled 2019-09-30: qty 250

## 2019-09-30 MED ORDER — IRON SUCROSE 20 MG/ML IV SOLN
200.0000 mg | Freq: Once | INTRAVENOUS | Status: AC
  Administered 2019-09-30: 200 mg via INTRAVENOUS
  Filled 2019-09-30: qty 10

## 2019-10-05 ENCOUNTER — Encounter: Payer: Self-pay | Admitting: Dermatology

## 2019-11-15 ENCOUNTER — Other Ambulatory Visit: Payer: Self-pay

## 2019-11-15 ENCOUNTER — Ambulatory Visit (INDEPENDENT_AMBULATORY_CARE_PROVIDER_SITE_OTHER): Payer: Medicare Other | Admitting: Dermatology

## 2019-11-15 DIAGNOSIS — D692 Other nonthrombocytopenic purpura: Secondary | ICD-10-CM

## 2019-11-15 DIAGNOSIS — L820 Inflamed seborrheic keratosis: Secondary | ICD-10-CM

## 2019-11-15 DIAGNOSIS — L578 Other skin changes due to chronic exposure to nonionizing radiation: Secondary | ICD-10-CM | POA: Diagnosis not present

## 2019-11-15 DIAGNOSIS — L570 Actinic keratosis: Secondary | ICD-10-CM | POA: Diagnosis not present

## 2019-11-15 DIAGNOSIS — L821 Other seborrheic keratosis: Secondary | ICD-10-CM | POA: Diagnosis not present

## 2019-11-15 NOTE — Progress Notes (Signed)
° °  Follow-Up Visit   Subjective  Cam Dauphin is a 79 y.o. male who presents for the following: Actinic Keratosis (recheck for any residual AK's on the forehead) and ISK's (recheck for any residual ISK's on the chest).  The following portions of the chart were reviewed this encounter and updated as appropriate:  Tobacco   Allergies   Meds   Problems   Med Hx   Surg Hx   Fam Hx      Review of Systems:  No other skin or systemic complaints except as noted in HPI or Assessment and Plan.  Objective  Well appearing patient in no apparent distress; mood and affect are within normal limits.  A focused examination was performed including face, ears, chest, and arms. Relevant physical exam findings are noted in the Assessment and Plan.  Objective  Face and ears (10): Erythematous thin papules/macules with gritty scale.   Objective  L arm x 1, chest x 7 (8): Erythematous keratotic or waxy stuck-on papule or plaque.   Assessment & Plan  AK (actinic keratosis) (10) Face and ears  Destruction of lesion - Face and ears Complexity: simple   Destruction method: cryotherapy   Informed consent: discussed and consent obtained   Timeout:  patient name, date of birth, surgical site, and procedure verified Lesion destroyed using liquid nitrogen: Yes   Region frozen until ice ball extended beyond lesion: Yes   Outcome: patient tolerated procedure well with no complications   Post-procedure details: wound care instructions given    Inflamed seborrheic keratosis (8) L arm x 1, chest x 7  Destruction of lesion - L arm x 1, chest x 7 Complexity: simple   Destruction method: cryotherapy   Informed consent: discussed and consent obtained   Timeout:  patient name, date of birth, surgical site, and procedure verified Lesion destroyed using liquid nitrogen: Yes   Region frozen until ice ball extended beyond lesion: Yes   Outcome: patient tolerated procedure well with no complications     Post-procedure details: wound care instructions given     Seborrheic Keratoses - Stuck-on, waxy, tan-brown papules and plaques  - Discussed benign etiology and prognosis. - Observe - Call for any changes  Actinic Damage - diffuse scaly erythematous macules with underlying dyspigmentation - Recommend daily broad spectrum sunscreen SPF 30+ to sun-exposed areas, reapply every 2 hours as needed.  - Call for new or changing lesions.  Purpura - Violaceous macules and patches - Benign - Related to age, sun damage and/or use of blood thinners - Observe - Can use OTC arnica containing moisturizer such as Dermend Bruise Formula if desired - Call for worsening or other concerns  Return in about 4 weeks (around 12/13/2019) for nurse visit PDT of the face concentrating on the forehead, in 6 months follow up with Dr. Dara Lords, Rudell Cobb, CMA, am acting as scribe for Sarina Ser, MD .  Documentation: I have reviewed the above documentation for accuracy and completeness, and I agree with the above.  Sarina Ser, MD

## 2019-11-17 ENCOUNTER — Encounter: Payer: Self-pay | Admitting: Dermatology

## 2019-11-30 ENCOUNTER — Ambulatory Visit: Payer: Medicare Other | Admitting: Urology

## 2019-12-14 ENCOUNTER — Ambulatory Visit: Payer: Medicare Other

## 2019-12-18 NOTE — Progress Notes (Signed)
[place]  Telephone:(336) [phone] Fax:(336) [phone]  ID: Gregory Dixon OB: April 23, 1940  MR#: [id number]  IRW#:[id number]  Patient Care Team: Valera Castle, MD as PCP - General (Family Medicine) Lloyd Huger, MD as Consulting Physician (Oncology)  CHIEF COMPLAINT: Anemia associated with chronic renal failure, iron deficiency anemia.  INTERVAL HISTORY: Patient returns to clinic today for repeat laboratory work, further evaluation, and consideration of Retacrit.  He currently feels well and is asymptomatic.  He does not complain of weakness or fatigue today. He has no neurologic complaints.  He denies any recent fevers or illnesses.  He has a good appetite and denies weight loss.  He has no chest pain, shortness of breath, cough, or hemoptysis.  He denies any nausea, vomiting, constipation, or diarrhea.  He denies any melena or hematochezia.  He has no urinary complaints.  Patient offers no specific complaints today.  REVIEW OF SYSTEMS:   Review of Systems  Constitutional: Negative.  Negative for fever, malaise/fatigue and weight loss.  Respiratory: Negative.  Negative for cough and hemoptysis.   Cardiovascular: Negative.  Negative for chest pain and leg swelling.  Gastrointestinal: Negative.  Negative for abdominal pain, blood in stool and melena.  Genitourinary: Negative.  Negative for hematuria.  Musculoskeletal: Negative.  Negative for back pain.  Skin: Negative.  Negative for rash.  Neurological: Negative.  Negative for dizziness, focal weakness, weakness and headaches.  Psychiatric/Behavioral: Negative.  The patient is not nervous/anxious.     As per HPI. Otherwise, a complete review of systems is negative.  PAST MEDICAL HISTORY: Past Medical History:  Diagnosis Date  . Actinic keratosis   . Basal cell carcinoma 10/21/2017   left lateral neck, excised 02/09/2018  . Basal cell carcinoma (BCC) 05/12/2019   right posterior ear, excised 05/31/2019   . Blood transfusion without reported diagnosis   . Diabetes mellitus without complication ([place])     PAST SURGICAL HISTORY: Past Surgical History:  Procedure Laterality Date  . COLONOSCOPY WITH PROPOFOL N/A 04/29/2019   Procedure: COLONOSCOPY WITH PROPOFOL;  Surgeon: Jonathon Bellows, MD;  Location: [facility] ENDOSCOPY;  Service: Gastroenterology;  Laterality: N/A;  . ESOPHAGOGASTRODUODENOSCOPY (EGD) WITH PROPOFOL N/A 04/29/2019   Procedure: ESOPHAGOGASTRODUODENOSCOPY (EGD) WITH PROPOFOL;  Surgeon: Jonathon Bellows, MD;  Location: [facility] ENDOSCOPY;  Service: Gastroenterology;  Laterality: N/A;    FAMILY HISTORY: History reviewed. No pertinent family history.  ADVANCED DIRECTIVES (Y/N):  N  HEALTH MAINTENANCE: Social History   Tobacco Use  . Smoking status: Former Research scientist (life sciences)  . Smokeless tobacco: Never Used  Vaping Use  . Vaping Use: Never used  Substance Use Topics  . Alcohol use: Not Currently  . Drug use: Never     Colonoscopy:  PAP:  Bone density:  Lipid panel:  Allergies  Allergen Reactions  . Aspirin Other (See Comments)    GI bleed  . Codeine Nausea Only and Nausea And Vomiting    Current Outpatient Medications  Medication Sig Dispense Refill  . carvedilol (COREG) 25 MG tablet Take 25 mg by mouth 2 (two) times daily.    . fenofibrate 160 MG tablet Take 160 mg by mouth daily.    . ferrous sulfate 325 (65 FE) MG EC tablet Take 1 tablet (325 mg total) by mouth 2 (two) times daily. 60 tablet 0  . hydrALAZINE (APRESOLINE) 50 MG tablet Take by mouth.    . isosorbide mononitrate (IMDUR) 30 MG 24 hr tablet Take 30 mg by mouth every evening.     Marland Kitchen losartan (COZAAR)  25 MG tablet Take 25 mg by mouth daily.    . Multiple Vitamin (MULTIVITAMIN) capsule Take 1 capsule by mouth daily.    Marland Kitchen NOVOLOG MIX 70/30 FLEXPEN (70-30) 100 UNIT/ML FlexPen Inject 0.1 mLs (10 Units total) into the skin 2 (two) times daily. 15 mL 11  . olmesartan-hydrochlorothiazide (BENICAR HCT) 40-25 MG tablet     .  terazosin (HYTRIN) 10 MG capsule Take 10 mg by mouth every evening.     . vitamin B-12 (CYANOCOBALAMIN) 1000 MCG tablet Take 1 tablet (1,000 mcg total) by mouth daily. 30 tablet 0   No current facility-administered medications for this visit.    OBJECTIVE: Vitals:   12/23/19 1434  BP: (!) 178/76  Pulse: (!) 59  Resp: 20  Temp: (!) 97.5 F (36.4 C)     Body mass index is 28.93 kg/m.    ECOG FS:0 - Asymptomatic  General: Well-developed, well-nourished, no acute distress. Eyes: Pink conjunctiva, anicteric sclera. HEENT: Normocephalic, moist mucous membranes. Lungs: No audible wheezing or coughing. Heart: Regular rate and rhythm. Abdomen: Soft, nontender, no obvious distention. Musculoskeletal: No edema, cyanosis, or clubbing. Neuro: Alert, answering all questions appropriately. Cranial nerves grossly intact. Skin: No rashes or petechiae noted. Psych: Normal affect.   LAB RESULTS:  Lab Results  Component Value Date   NA 138 07/05/2019   K 4.4 07/05/2019   CL 107 07/05/2019   CO2 24 07/05/2019   GLUCOSE 159 (H) 07/05/2019   BUN 40 (H) 07/05/2019   CREATININE 3.19 (H) 07/05/2019   CALCIUM 8.6 (L) 07/05/2019   PROT 5.9 (L) 04/27/2019   ALBUMIN 3.1 (L) 04/27/2019   AST 17 04/27/2019   ALT 13 04/27/2019   ALKPHOS 19 (L) 04/27/2019   BILITOT 0.5 04/27/2019   GFRNONAA 18 (L) 07/05/2019   GFRAA 20 (L) 07/05/2019    Lab Results  Component Value Date   WBC 4.8 12/23/2019   NEUTROABS 3.3 12/23/2019   HGB 10.3 (L) 12/23/2019   HCT 30.8 (L) 12/23/2019   MCV 84.6 12/23/2019   PLT 157 12/23/2019   Lab Results  Component Value Date   IRON 41 (L) 12/23/2019   TIBC 329 12/23/2019   IRONPCTSAT 13 (L) 12/23/2019   Lab Results  Component Value Date   FERRITIN 45 12/23/2019     STUDIES: No results found.  ASSESSMENT: Anemia associated with chronic renal failure, iron deficiency anemia.  PLAN:    1.  Anemia associated with chronic renal failure, iron deficiency  anemia: EGD, virtual endoscopy, and colonoscopy in January 2021 were unrevealing, but 2 polyps were removed.  No malignancy was found, but recommendation was to repeat colonoscopy in 6 months in July 2021.  This has not yet occurred and patient may need a referral back to GI in the near future.  Hemoglobin remains decreased, but improved to 10.3.  Iron stores have also improved, but he does have a persistently low iron saturation of 13%.  No intervention is needed at this time.  Patient last received IV Venofer on September 30, 2019.  No intervention is needed at this time, but patient will likely require additional Venofer in the future.  Return to clinic in 3 months with repeat laboratory work, further evaluation, and continuation of treatment if needed. If patient's iron stores are repleted, he may benefit from Retacrit in the future if his blood pressure is better controlled and hemoglobin trends below 10.0. 2.  Chronic renal insufficiency: Chronic and unchanged.  Patient's most recent creatinine is greater than  3.0.  Continue follow-up with nephrology as scheduled.  Possible Retacrit as above. 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 4.  Leukopenia: Resolved.   Patient expressed understanding and was in agreement with this plan. He also understands that He can call clinic at any time with any questions, concerns, or complaints.    Lloyd Huger, MD   12/25/2019 7:17 AM

## 2019-12-22 ENCOUNTER — Encounter: Payer: Self-pay | Admitting: Oncology

## 2019-12-22 NOTE — Progress Notes (Signed)
Patient denies any pain or concerns at this time.  

## 2019-12-23 ENCOUNTER — Inpatient Hospital Stay: Payer: Medicare Other | Attending: Oncology

## 2019-12-23 ENCOUNTER — Inpatient Hospital Stay: Payer: Medicare Other

## 2019-12-23 ENCOUNTER — Other Ambulatory Visit: Payer: Self-pay

## 2019-12-23 ENCOUNTER — Inpatient Hospital Stay (HOSPITAL_BASED_OUTPATIENT_CLINIC_OR_DEPARTMENT_OTHER): Payer: Medicare Other | Admitting: Oncology

## 2019-12-23 VITALS — BP 178/76 | HR 59 | Temp 97.5°F | Resp 20 | Wt 250.3 lb

## 2019-12-23 DIAGNOSIS — E1122 Type 2 diabetes mellitus with diabetic chronic kidney disease: Secondary | ICD-10-CM | POA: Diagnosis not present

## 2019-12-23 DIAGNOSIS — N183 Chronic kidney disease, stage 3 unspecified: Secondary | ICD-10-CM | POA: Insufficient documentation

## 2019-12-23 DIAGNOSIS — D500 Iron deficiency anemia secondary to blood loss (chronic): Secondary | ICD-10-CM

## 2019-12-23 DIAGNOSIS — E611 Iron deficiency: Secondary | ICD-10-CM | POA: Diagnosis not present

## 2019-12-23 DIAGNOSIS — Z79899 Other long term (current) drug therapy: Secondary | ICD-10-CM | POA: Insufficient documentation

## 2019-12-23 DIAGNOSIS — Z886 Allergy status to analgesic agent status: Secondary | ICD-10-CM | POA: Insufficient documentation

## 2019-12-23 DIAGNOSIS — N189 Chronic kidney disease, unspecified: Secondary | ICD-10-CM | POA: Diagnosis not present

## 2019-12-23 DIAGNOSIS — Z85828 Personal history of other malignant neoplasm of skin: Secondary | ICD-10-CM | POA: Insufficient documentation

## 2019-12-23 DIAGNOSIS — Z885 Allergy status to narcotic agent status: Secondary | ICD-10-CM | POA: Diagnosis not present

## 2019-12-23 DIAGNOSIS — Z87891 Personal history of nicotine dependence: Secondary | ICD-10-CM | POA: Diagnosis not present

## 2019-12-23 DIAGNOSIS — D472 Monoclonal gammopathy: Secondary | ICD-10-CM | POA: Insufficient documentation

## 2019-12-23 DIAGNOSIS — D631 Anemia in chronic kidney disease: Secondary | ICD-10-CM | POA: Insufficient documentation

## 2019-12-23 LAB — CBC WITH DIFFERENTIAL/PLATELET
Abs Immature Granulocytes: 0.03 10*3/uL (ref 0.00–0.07)
Basophils Absolute: 0 10*3/uL (ref 0.0–0.1)
Basophils Relative: 0 %
Eosinophils Absolute: 0.1 10*3/uL (ref 0.0–0.5)
Eosinophils Relative: 2 %
HCT: 30.8 % — ABNORMAL LOW (ref 39.0–52.0)
Hemoglobin: 10.3 g/dL — ABNORMAL LOW (ref 13.0–17.0)
Immature Granulocytes: 1 %
Lymphocytes Relative: 21 %
Lymphs Abs: 1 10*3/uL (ref 0.7–4.0)
MCH: 28.3 pg (ref 26.0–34.0)
MCHC: 33.4 g/dL (ref 30.0–36.0)
MCV: 84.6 fL (ref 80.0–100.0)
Monocytes Absolute: 0.4 10*3/uL (ref 0.1–1.0)
Monocytes Relative: 7 %
Neutro Abs: 3.3 10*3/uL (ref 1.7–7.7)
Neutrophils Relative %: 69 %
Platelets: 157 10*3/uL (ref 150–400)
RBC: 3.64 MIL/uL — ABNORMAL LOW (ref 4.22–5.81)
RDW: 14.7 % (ref 11.5–15.5)
WBC: 4.8 10*3/uL (ref 4.0–10.5)
nRBC: 0 % (ref 0.0–0.2)

## 2019-12-23 LAB — IRON AND TIBC
Iron: 41 ug/dL — ABNORMAL LOW (ref 45–182)
Saturation Ratios: 13 % — ABNORMAL LOW (ref 17.9–39.5)
TIBC: 329 ug/dL (ref 250–450)
UIBC: 288 ug/dL

## 2019-12-23 LAB — FERRITIN: Ferritin: 45 ng/mL (ref 24–336)

## 2020-03-23 ENCOUNTER — Inpatient Hospital Stay (HOSPITAL_BASED_OUTPATIENT_CLINIC_OR_DEPARTMENT_OTHER): Payer: Medicare Other | Admitting: Nurse Practitioner

## 2020-03-23 ENCOUNTER — Encounter: Payer: Self-pay | Admitting: Nurse Practitioner

## 2020-03-23 ENCOUNTER — Inpatient Hospital Stay: Payer: Medicare Other | Attending: Oncology

## 2020-03-23 ENCOUNTER — Inpatient Hospital Stay: Payer: Medicare Other

## 2020-03-23 VITALS — BP 156/77 | HR 72 | Resp 20 | Wt 261.0 lb

## 2020-03-23 DIAGNOSIS — D472 Monoclonal gammopathy: Secondary | ICD-10-CM | POA: Diagnosis not present

## 2020-03-23 DIAGNOSIS — N189 Chronic kidney disease, unspecified: Secondary | ICD-10-CM

## 2020-03-23 DIAGNOSIS — Z85828 Personal history of other malignant neoplasm of skin: Secondary | ICD-10-CM | POA: Insufficient documentation

## 2020-03-23 DIAGNOSIS — Z87891 Personal history of nicotine dependence: Secondary | ICD-10-CM | POA: Diagnosis not present

## 2020-03-23 DIAGNOSIS — R252 Cramp and spasm: Secondary | ICD-10-CM | POA: Diagnosis not present

## 2020-03-23 DIAGNOSIS — Z885 Allergy status to narcotic agent status: Secondary | ICD-10-CM | POA: Insufficient documentation

## 2020-03-23 DIAGNOSIS — D500 Iron deficiency anemia secondary to blood loss (chronic): Secondary | ICD-10-CM

## 2020-03-23 DIAGNOSIS — D631 Anemia in chronic kidney disease: Secondary | ICD-10-CM | POA: Insufficient documentation

## 2020-03-23 DIAGNOSIS — E611 Iron deficiency: Secondary | ICD-10-CM | POA: Insufficient documentation

## 2020-03-23 DIAGNOSIS — I129 Hypertensive chronic kidney disease with stage 1 through stage 4 chronic kidney disease, or unspecified chronic kidney disease: Secondary | ICD-10-CM | POA: Diagnosis not present

## 2020-03-23 DIAGNOSIS — Z79899 Other long term (current) drug therapy: Secondary | ICD-10-CM | POA: Insufficient documentation

## 2020-03-23 DIAGNOSIS — Z886 Allergy status to analgesic agent status: Secondary | ICD-10-CM | POA: Insufficient documentation

## 2020-03-23 LAB — CBC WITH DIFFERENTIAL/PLATELET
Abs Immature Granulocytes: 0.04 10*3/uL (ref 0.00–0.07)
Basophils Absolute: 0 10*3/uL (ref 0.0–0.1)
Basophils Relative: 0 %
Eosinophils Absolute: 0.1 10*3/uL (ref 0.0–0.5)
Eosinophils Relative: 3 %
HCT: 27.3 % — ABNORMAL LOW (ref 39.0–52.0)
Hemoglobin: 9.1 g/dL — ABNORMAL LOW (ref 13.0–17.0)
Immature Granulocytes: 1 %
Lymphocytes Relative: 21 %
Lymphs Abs: 1 10*3/uL (ref 0.7–4.0)
MCH: 29.6 pg (ref 26.0–34.0)
MCHC: 33.3 g/dL (ref 30.0–36.0)
MCV: 88.9 fL (ref 80.0–100.0)
Monocytes Absolute: 0.4 10*3/uL (ref 0.1–1.0)
Monocytes Relative: 8 %
Neutro Abs: 3.1 10*3/uL (ref 1.7–7.7)
Neutrophils Relative %: 67 %
Platelets: 161 10*3/uL (ref 150–400)
RBC: 3.07 MIL/uL — ABNORMAL LOW (ref 4.22–5.81)
RDW: 13.8 % (ref 11.5–15.5)
WBC: 4.6 10*3/uL (ref 4.0–10.5)
nRBC: 0 % (ref 0.0–0.2)

## 2020-03-23 LAB — IRON AND TIBC
Iron: 22 ug/dL — ABNORMAL LOW (ref 45–182)
Saturation Ratios: 7 % — ABNORMAL LOW (ref 17.9–39.5)
TIBC: 329 ug/dL (ref 250–450)
UIBC: 307 ug/dL

## 2020-03-23 LAB — FERRITIN: Ferritin: 74 ng/mL (ref 24–336)

## 2020-03-23 MED ORDER — EPOETIN ALFA-EPBX 40000 UNIT/ML IJ SOLN
40000.0000 [IU] | Freq: Once | INTRAMUSCULAR | Status: AC
  Administered 2020-03-23: 40000 [IU] via SUBCUTANEOUS
  Filled 2020-03-23: qty 1

## 2020-03-23 NOTE — Progress Notes (Signed)
Patient denies any concerns today.  

## 2020-03-23 NOTE — Progress Notes (Signed)
[place]  Telephone:(336) [phone] Fax:(336) [phone]  ID: Herold Harms OB: April 10, 1940  MR#: [id number]  KNL#:[id number]  Patient Care Team: Valera Castle, MD as PCP - General (Family Medicine) Lloyd Huger, MD as Consulting Physician (Oncology)  CHIEF COMPLAINT: Anemia associated with chronic renal failure, iron deficiency anemia.  INTERVAL HISTORY: Patient returns to clinic today for repeat lab work, further evaluation, and consideration of Retacrit.  He currently feels well and is asymptomatic.  He has intermittent leg cramps that may have improved after previous Venofer infusions.  No weakness or fatigue.  No neurologic complaints.  No fevers or illness.  Appetite is stable and he endorses weight gain.  No chest pain, shortness of breath, cough, hemoptysis.  He denies nausea, vomiting, constipation, or diarrhea.  He denies any melena or hematochezia.  No pica.  No urinary complaints.  No specific complaints today.  He has not had repeat colonoscopy and says that he would like to avoid that if at all possible.   REVIEW OF SYSTEMS:   Review of Systems  Constitutional: Negative.  Negative for fever, malaise/fatigue and weight loss.  Respiratory: Negative.  Negative for cough and hemoptysis.   Cardiovascular: Negative.  Negative for chest pain and leg swelling.  Gastrointestinal: Negative.  Negative for abdominal pain, blood in stool and melena.  Genitourinary: Negative.  Negative for hematuria.  Musculoskeletal: Negative.  Negative for back pain.  Skin: Negative.  Negative for rash.  Neurological: Negative.  Negative for dizziness, focal weakness, weakness and headaches.  Psychiatric/Behavioral: Negative.  The patient is not nervous/anxious.    As per HPI. Otherwise, a complete review of systems is negative.  PAST MEDICAL HISTORY: Past Medical History:  Diagnosis Date  . Actinic keratosis   . Basal cell carcinoma 10/21/2017   left lateral neck,  excised 02/09/2018  . Basal cell carcinoma (BCC) 05/12/2019   right posterior ear, excised 05/31/2019  . Blood transfusion without reported diagnosis   . Diabetes mellitus without complication ([place])     PAST SURGICAL HISTORY: Past Surgical History:  Procedure Laterality Date  . COLONOSCOPY WITH PROPOFOL N/A 04/29/2019   Procedure: COLONOSCOPY WITH PROPOFOL;  Surgeon: Jonathon Bellows, MD;  Location: [facility] ENDOSCOPY;  Service: Gastroenterology;  Laterality: N/A;  . ESOPHAGOGASTRODUODENOSCOPY (EGD) WITH PROPOFOL N/A 04/29/2019   Procedure: ESOPHAGOGASTRODUODENOSCOPY (EGD) WITH PROPOFOL;  Surgeon: Jonathon Bellows, MD;  Location: [facility] END[facility]COPY;  Service: Gastroenterology;  Laterality: N/A;    FAMILY HISTORY: History reviewed. No pertinent family history.  ADVANCED DIRECTIVES (Y/N):  N  HEALTH MAINTENANCE: Social History   Tobacco Use  . Smoking status: Former Research scientist (life sciences)  . Smokeless tobacco: Never Used  Vaping Use  . Vaping Use: Never used  Substance Use Topics  . Alcohol use: Not Currently  . Drug use: Never     Colonoscopy:  PAP:  Bone density:  Lipid panel:  Allergies  Allergen Reactions  . Aspirin Other (See Comments)    GI bleed  . Codeine Nausea Only and Nausea And Vomiting   Immunization History  Administered Date(s) Administered  . Moderna Sars-Covid-2 Vaccination 10/31/2019, 11/28/2019  . Pneumococcal Conjugate-13 10/10/2014  . Pneumococcal Polysaccharide-23 10/16/2015     Current Outpatient Medications  Medication Sig Dispense Refill  . carvedilol (COREG) 25 MG tablet Take 25 mg by mouth 2 (two) times daily.    . fenofibrate 160 MG tablet Take 160 mg by mouth daily.    . ferrous sulfate 325 (65 FE) MG EC tablet Take 1 tablet (325 mg total)  by mouth 2 (two) times daily. 60 tablet 0  . hydrALAZINE (APRESOLINE) 50 MG tablet Take by mouth.    . isosorbide mononitrate (IMDUR) 30 MG 24 hr tablet Take 30 mg by mouth every evening.     . Multiple Vitamin (MULTIVITAMIN)  capsule Take 1 capsule by mouth daily.    Marland Kitchen NOVOLOG MIX 70/30 FLEXPEN (70-30) 100 UNIT/ML FlexPen Inject 0.1 mLs (10 Units total) into the skin 2 (two) times daily. 15 mL 11  . olmesartan-hydrochlorothiazide (BENICAR HCT) 40-25 MG tablet     . terazosin (HYTRIN) 10 MG capsule Take 10 mg by mouth every evening.     . vitamin B-12 (CYANOCOBALAMIN) 1000 MCG tablet Take 1 tablet (1,000 mcg total) by mouth daily. 30 tablet 0   No current facility-administered medications for this visit.    OBJECTIVE: Vitals:   03/23/20 1413  BP: (!) 156/77  Pulse: 72  Resp: 20     Body mass index is 30.16 kg/m.    ECOG FS:0 - Asymptomatic  General: Well-developed, well-nourished, no acute distress. Eyes: Pink conjunctiva, anicteric sclera. Lungs: No audible wheezing or coughing Heart: Regular rate and rhythm.  Abdomen: Soft, nontender, nondistended.  Musculoskeletal: No edema, cyanosis, or clubbing. Cane.  Neuro: Alert, answering all questions appropriately. Cranial nerves grossly intact. Skin: No rashes or petechiae noted. Psych: Normal affect.  LAB RESULTS:  Lab Results  Component Value Date   NA 138 07/05/2019   K 4.4 07/05/2019   CL 107 07/05/2019   CO2 24 07/05/2019   GLUCOSE 159 (H) 07/05/2019   BUN 40 (H) 07/05/2019   CREATININE 3.19 (H) 07/05/2019   CALCIUM 8.6 (L) 07/05/2019   PROT 5.9 (L) 04/27/2019   ALBUMIN 3.1 (L) 04/27/2019   AST 17 04/27/2019   ALT 13 04/27/2019   ALKPHOS 19 (L) 04/27/2019   BILITOT 0.5 04/27/2019   GFRNONAA 18 (L) 07/05/2019   GFRAA 20 (L) 07/05/2019    Lab Results  Component Value Date   WBC 4.6 03/23/2020   NEUTROABS 3.1 03/23/2020   HGB 9.1 (L) 03/23/2020   HCT 27.3 (L) 03/23/2020   MCV 88.9 03/23/2020   PLT 161 03/23/2020   Lab Results  Component Value Date   IRON 41 (L) 12/23/2019   TIBC 329 12/23/2019   IRONPCTSAT 13 (L) 12/23/2019   Lab Results  Component Value Date   FERRITIN 45 12/23/2019     STUDIES: No results  found.  ASSESSMENT: Anemia associated with chronic renal failure, iron deficiency anemia.  PLAN:    1.  Anemia associated with chronic renal failure, iron deficiency anemia: EGD, virtual endoscopy, and colonoscopy in January 2021 were unrevealing but 2 polyps were removed.  Since they were taken out in pieces, recommendation was to repeat colonoscopy in 6 months.  Capsule study was also recommended.  Patient has declined repeat colonoscopy or capsule study.  Risk versus benefits discussed today and patient continues to decline repeat evaluation.  Hemoglobin decreased to 9.1 today.  Previously 10.3.  No microcytosis.  Iron stores pending at time of dictation.  He does have a history of persistently low iron saturation.  Hold Venofer at this time.  Hemoglobin less than 10 and improved blood pressure control today.  Recommend initiating Retacrit today. Will discuss dosing frequency with Dr. Grayland Ormond.  If iron stores reduced, will bring patient back for Venofer in the interim.  Otherwise, plan to return to clinic in 3 months with labs, further evaluation, continuation of treatment. 2.  Chronic renal insufficiency:  Chronic and unchanged secondary to diabetes and hypertension patient's most recent creatinine is greater than 3.0.  Continue follow-up with nephrology as scheduled.  Plan to initiate Retacrit today. Risks vs benefits reviewed and patient wishes to proceed.  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 4.  Leukopenia: Resolved.  Patient expressed understanding and was in agreement with this plan. He also understands that He can call clinic at any time with any questions, concerns, or complaints.    Verlon Au, NP   03/23/2020 4:03 PM

## 2020-03-23 NOTE — Progress Notes (Signed)
Pt received retacrit injection as ordered. VSS. Pt stable for discharge.   Gregory Dixon [employer]

## 2020-04-16 ENCOUNTER — Inpatient Hospital Stay: Payer: Medicare Other | Attending: Oncology

## 2020-04-16 ENCOUNTER — Inpatient Hospital Stay: Payer: Medicare Other

## 2020-04-16 ENCOUNTER — Other Ambulatory Visit: Payer: Self-pay

## 2020-04-16 VITALS — BP 158/73 | HR 58

## 2020-04-16 DIAGNOSIS — D631 Anemia in chronic kidney disease: Secondary | ICD-10-CM

## 2020-04-16 DIAGNOSIS — D472 Monoclonal gammopathy: Secondary | ICD-10-CM | POA: Insufficient documentation

## 2020-04-16 DIAGNOSIS — D500 Iron deficiency anemia secondary to blood loss (chronic): Secondary | ICD-10-CM

## 2020-04-16 DIAGNOSIS — N183 Chronic kidney disease, stage 3 unspecified: Secondary | ICD-10-CM | POA: Diagnosis not present

## 2020-04-16 DIAGNOSIS — N189 Chronic kidney disease, unspecified: Secondary | ICD-10-CM

## 2020-04-16 LAB — CBC WITH DIFFERENTIAL/PLATELET
Abs Immature Granulocytes: 0.03 10*3/uL (ref 0.00–0.07)
Basophils Absolute: 0 10*3/uL (ref 0.0–0.1)
Basophils Relative: 1 %
Eosinophils Absolute: 0.1 10*3/uL (ref 0.0–0.5)
Eosinophils Relative: 3 %
HCT: 29.9 % — ABNORMAL LOW (ref 39.0–52.0)
Hemoglobin: 9.9 g/dL — ABNORMAL LOW (ref 13.0–17.0)
Immature Granulocytes: 1 %
Lymphocytes Relative: 22 %
Lymphs Abs: 0.9 10*3/uL (ref 0.7–4.0)
MCH: 28.9 pg (ref 26.0–34.0)
MCHC: 33.1 g/dL (ref 30.0–36.0)
MCV: 87.4 fL (ref 80.0–100.0)
Monocytes Absolute: 0.3 10*3/uL (ref 0.1–1.0)
Monocytes Relative: 7 %
Neutro Abs: 2.8 10*3/uL (ref 1.7–7.7)
Neutrophils Relative %: 66 %
Platelets: 147 10*3/uL — ABNORMAL LOW (ref 150–400)
RBC: 3.42 MIL/uL — ABNORMAL LOW (ref 4.22–5.81)
RDW: 13.5 % (ref 11.5–15.5)
WBC: 4.2 10*3/uL (ref 4.0–10.5)
nRBC: 0 % (ref 0.0–0.2)

## 2020-04-16 MED ORDER — EPOETIN ALFA-EPBX 40000 UNIT/ML IJ SOLN
40000.0000 [IU] | Freq: Once | INTRAMUSCULAR | Status: AC
  Administered 2020-04-16: 40000 [IU] via SUBCUTANEOUS
  Filled 2020-04-16: qty 1

## 2020-05-14 ENCOUNTER — Inpatient Hospital Stay: Payer: Medicare Other | Attending: Oncology

## 2020-05-14 ENCOUNTER — Other Ambulatory Visit: Payer: Self-pay | Admitting: *Deleted

## 2020-05-14 ENCOUNTER — Inpatient Hospital Stay: Payer: Medicare Other

## 2020-05-14 VITALS — BP 168/76 | HR 56

## 2020-05-14 DIAGNOSIS — D500 Iron deficiency anemia secondary to blood loss (chronic): Secondary | ICD-10-CM

## 2020-05-14 DIAGNOSIS — N183 Chronic kidney disease, stage 3 unspecified: Secondary | ICD-10-CM | POA: Diagnosis not present

## 2020-05-14 DIAGNOSIS — N189 Chronic kidney disease, unspecified: Secondary | ICD-10-CM

## 2020-05-14 DIAGNOSIS — D472 Monoclonal gammopathy: Secondary | ICD-10-CM | POA: Insufficient documentation

## 2020-05-14 DIAGNOSIS — Z79899 Other long term (current) drug therapy: Secondary | ICD-10-CM | POA: Diagnosis not present

## 2020-05-14 DIAGNOSIS — D631 Anemia in chronic kidney disease: Secondary | ICD-10-CM | POA: Diagnosis present

## 2020-05-14 LAB — CBC WITH DIFFERENTIAL/PLATELET
Abs Immature Granulocytes: 0.04 10*3/uL (ref 0.00–0.07)
Basophils Absolute: 0 10*3/uL (ref 0.0–0.1)
Basophils Relative: 0 %
Eosinophils Absolute: 0.1 10*3/uL (ref 0.0–0.5)
Eosinophils Relative: 3 %
HCT: 29.7 % — ABNORMAL LOW (ref 39.0–52.0)
Hemoglobin: 9.8 g/dL — ABNORMAL LOW (ref 13.0–17.0)
Immature Granulocytes: 1 %
Lymphocytes Relative: 20 %
Lymphs Abs: 1 10*3/uL (ref 0.7–4.0)
MCH: 28.5 pg (ref 26.0–34.0)
MCHC: 33 g/dL (ref 30.0–36.0)
MCV: 86.3 fL (ref 80.0–100.0)
Monocytes Absolute: 0.4 10*3/uL (ref 0.1–1.0)
Monocytes Relative: 8 %
Neutro Abs: 3.3 10*3/uL (ref 1.7–7.7)
Neutrophils Relative %: 68 %
Platelets: 151 10*3/uL (ref 150–400)
RBC: 3.44 MIL/uL — ABNORMAL LOW (ref 4.22–5.81)
RDW: 13.8 % (ref 11.5–15.5)
WBC: 4.8 10*3/uL (ref 4.0–10.5)
nRBC: 0 % (ref 0.0–0.2)

## 2020-05-14 MED ORDER — EPOETIN ALFA-EPBX 40000 UNIT/ML IJ SOLN
40000.0000 [IU] | Freq: Once | INTRAMUSCULAR | Status: AC
  Administered 2020-05-14: 40000 [IU] via SUBCUTANEOUS
  Filled 2020-05-14: qty 1

## 2020-06-19 ENCOUNTER — Ambulatory Visit
Admission: RE | Admit: 2020-06-19 | Discharge: 2020-06-19 | Disposition: A | Discharge status: Home / Self Care | Payer: Medicare Other | Source: Ambulatory Visit | Attending: Urology | Admitting: Urology

## 2020-06-19 ENCOUNTER — Other Ambulatory Visit: Payer: Self-pay

## 2020-06-19 DIAGNOSIS — N281 Cyst of kidney, acquired: Secondary | ICD-10-CM | POA: Diagnosis not present

## 2020-06-19 IMAGING — US US RENAL
1 series · 14 of 25 positions shown · non-contrast
Comparison: [DATE]

CLINICAL DATA: Follow-up complex left renal cyst.

EXAM:
RENAL / URINARY TRACT ULTRASOUND COMPLETE

[Series 1: us renal · 0.28mm/px · 14 of 44 slices shown]
[im 1/44]
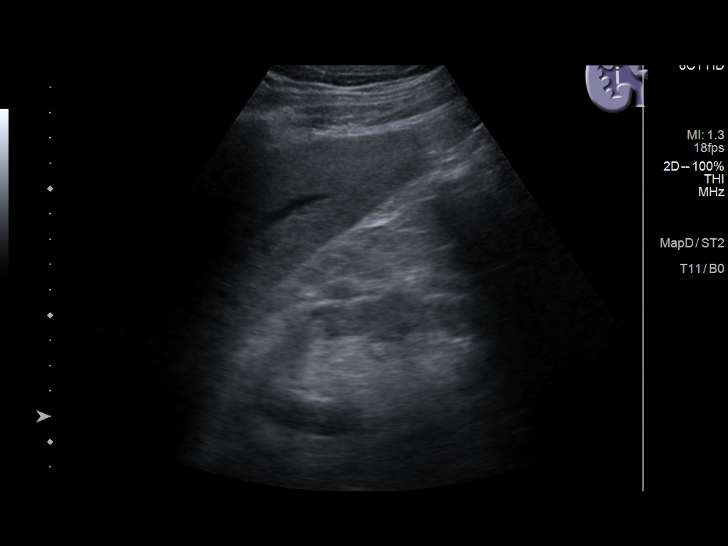
[im 4/44]
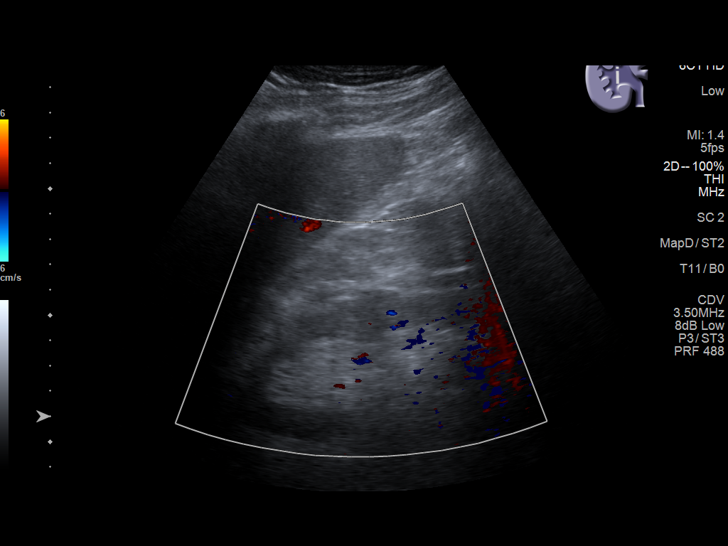
[im 8/44]
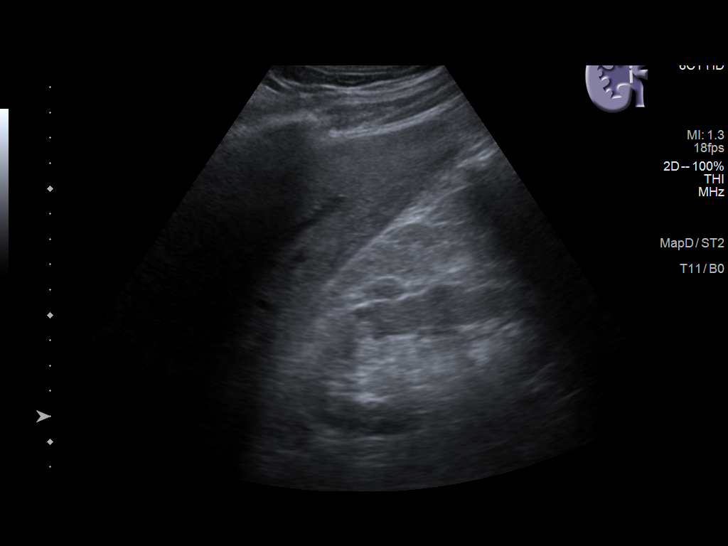
[im 11/44]
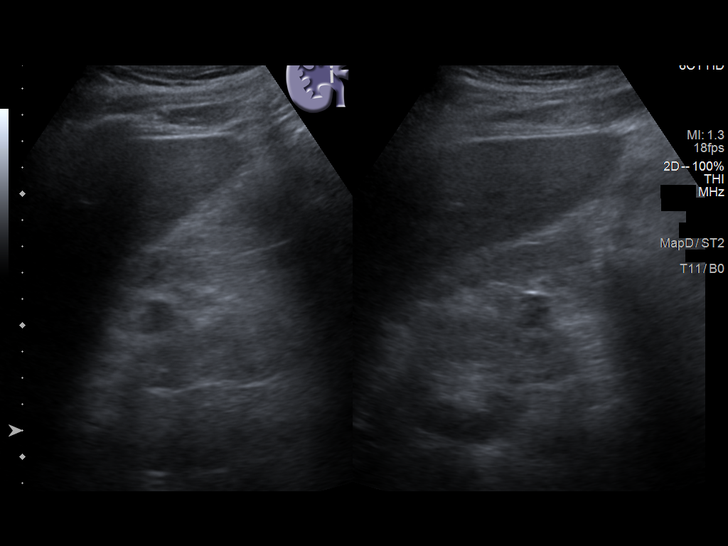
[im 15/44]
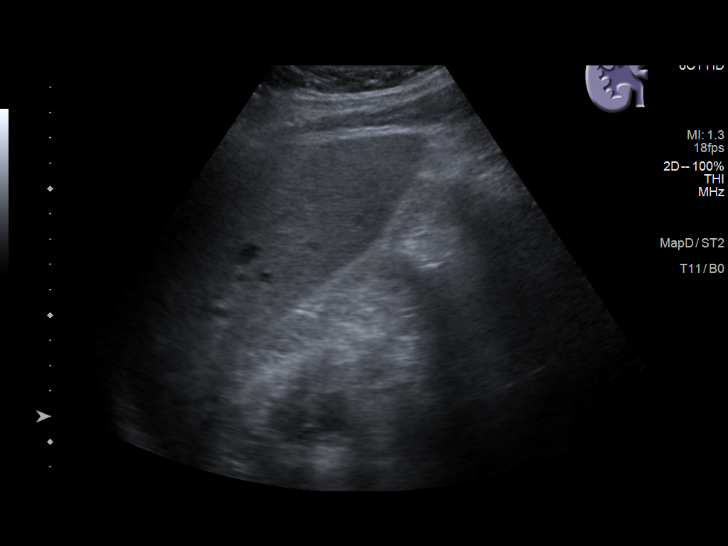
[im 17/44]
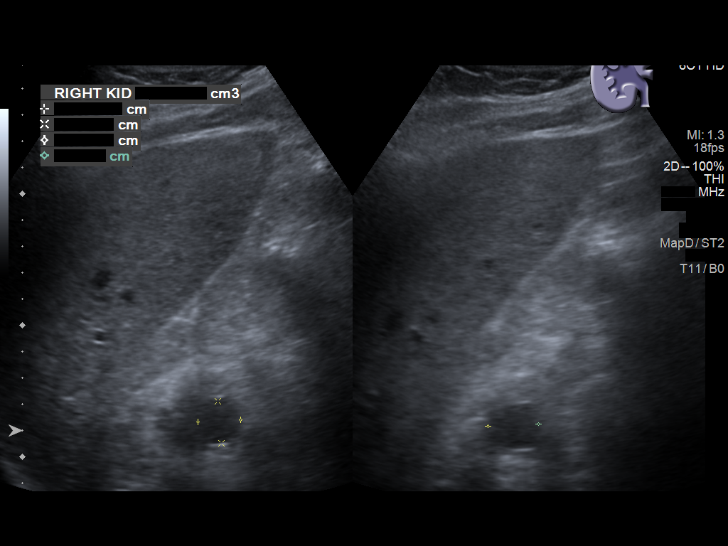
[im 20/44]
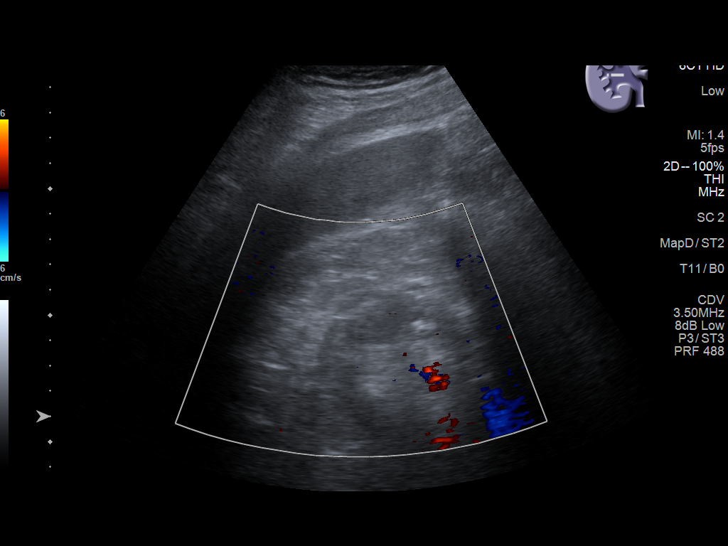
[im 24/44]
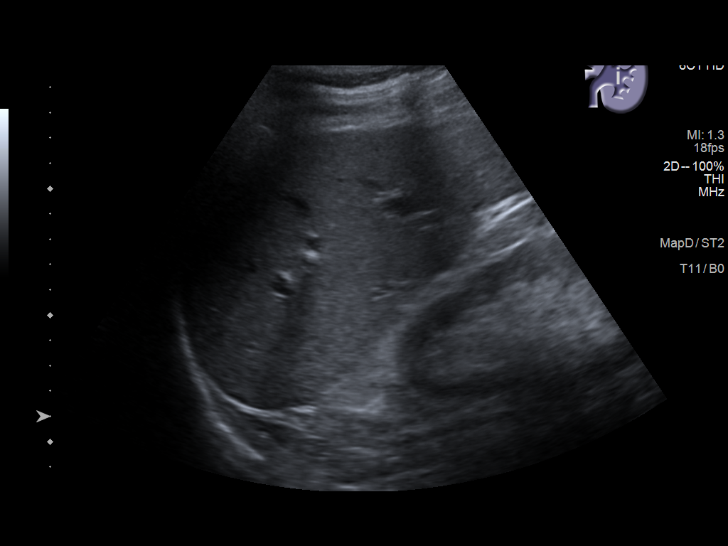
[im 27/44]
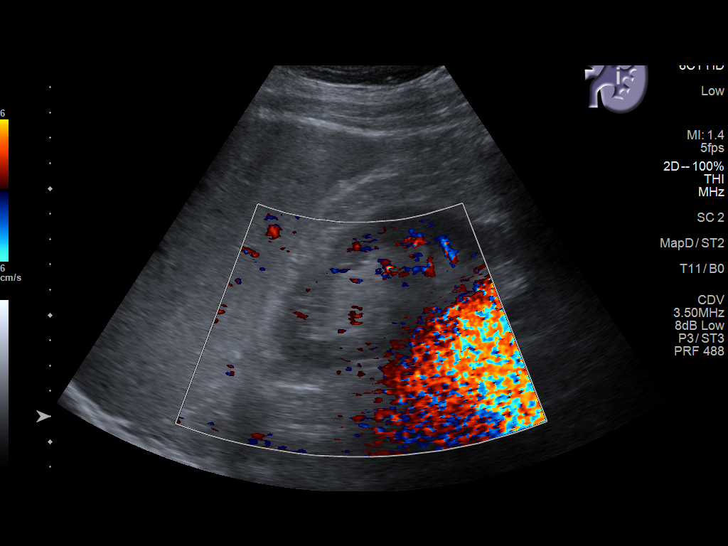
[im 29/44]
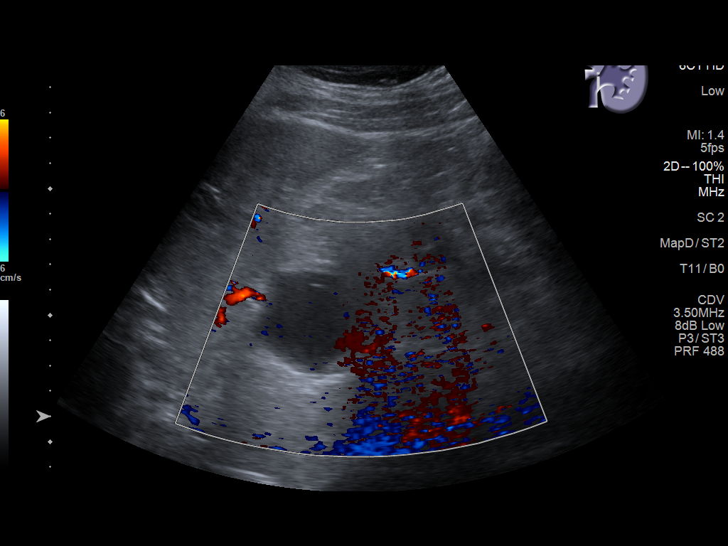
[im 33/44]
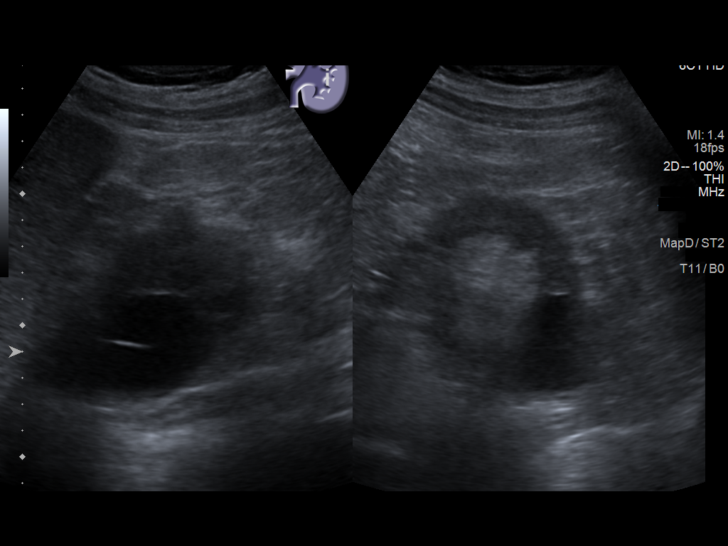
[im 36/44]
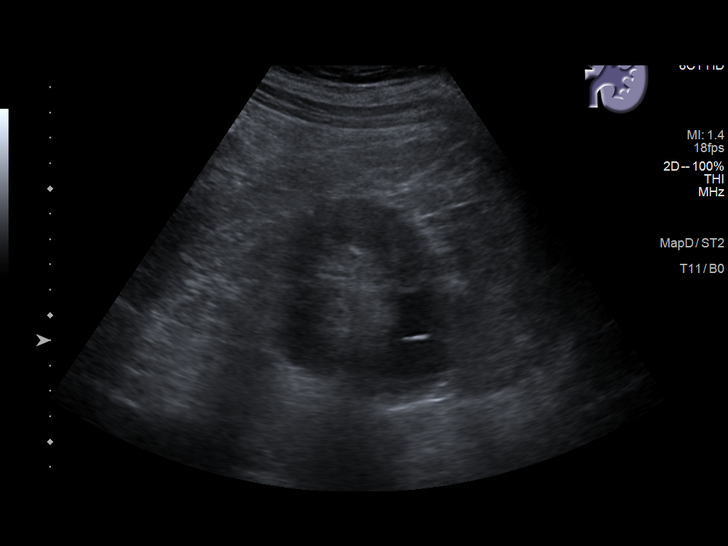
[im 40/44]
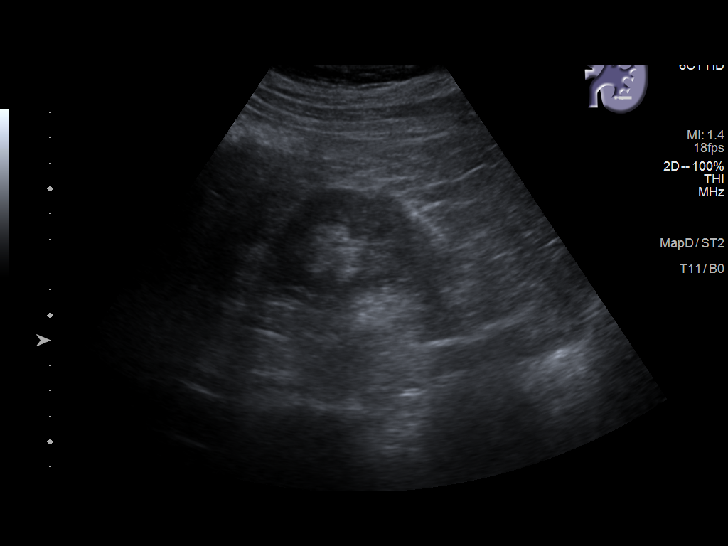
[im 44/44]
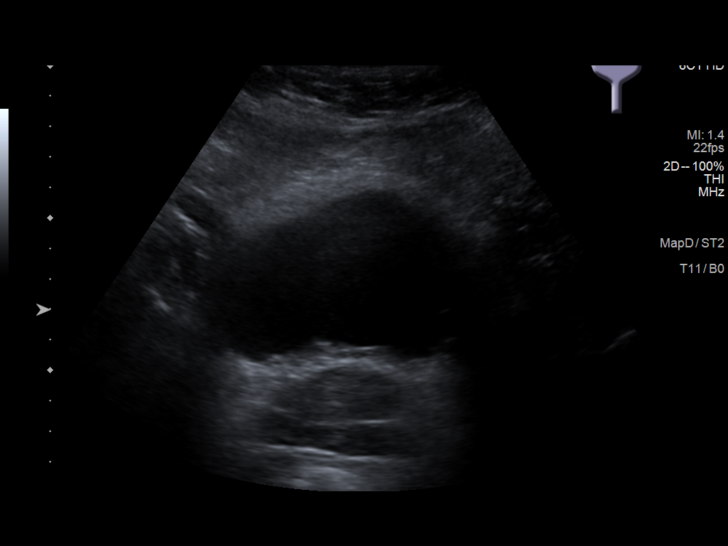

[14 of 25 positions shown; findings below may reference images not displayed]

FINDINGS: Right Kidney:

Renal measurements: 10.7 x 5.6 x 5.5 cm = volume: 171 mL.
Echogenicity within normal limits. Simple appearing upper pole right
renal cyst measuring up to 1.9 cm. Additional 1.4 cm cyst within the
interpolar region. 1.3 cm shadowing stone within the mid pole of the
right kidney. No hydronephrosis.

Left Kidney:

Renal measurements: 12.0 x 6.7 x 6.7 cm = volume: 282 mL.
Echogenicity within normal limits. Upper pole right renal cyst
measuring 3.9 cm. Additional upper pole left renal cyst with single
thin internal septation measuring 5.7 x 2.9 x 4.0 cm (previously
measured 5.8 cm). No shadowing stone or hydronephrosis.

Bladder:

Appears normal for degree of bladder distention.

Other:

None.
IMPRESSION: 1. Bilateral renal cysts including a stable minimally complex 5.7 cm
upper pole left renal cyst.
2. Nonobstructing 1.3 cm right renal stone.
3. No hydronephrosis.

## 2020-06-22 NOTE — Progress Notes (Signed)
Gregory Dixon  Telephone:(336) [phone] Fax:(336) [phone]  ID: Gregory Dixon OB: 1940-12-20  MR#: [id number]  VEH#:[id number]  Patient Care Team: Gregory Castle, MD as PCP - General (Family Medicine) Gregory Huger, MD as Consulting Physician (Oncology)  CHIEF COMPLAINT: Anemia associated with chronic renal failure, iron deficiency anemia.  INTERVAL HISTORY: Patient returns to clinic today for repeat laboratory work, further evaluation, and consideration of additional Retacrit.  He continues to feel well and remains asymptomatic.  He does not complain of any weakness or fatigue. He has no neurologic complaints.  He denies any recent fevers or illnesses.  He has a good appetite and denies weight loss.  He has no chest pain, shortness of breath, cough, or hemoptysis.  He denies any nausea, vomiting, constipation, or diarrhea.  He denies any melena or hematochezia.  He has no urinary complaints.  Patient offers no specific complaints today.  REVIEW OF SYSTEMS:   Review of Systems  Constitutional: Negative.  Negative for fever, malaise/fatigue and weight loss.  Respiratory: Negative.  Negative for cough and hemoptysis.   Cardiovascular: Negative.  Negative for chest pain and leg swelling.  Gastrointestinal: Negative.  Negative for abdominal pain, blood in stool and melena.  Genitourinary: Negative.  Negative for hematuria.  Musculoskeletal: Negative.  Negative for back pain.  Skin: Negative.  Negative for rash.  Neurological: Negative.  Negative for dizziness, focal weakness, weakness and headaches.  Psychiatric/Behavioral: Negative.  The patient is not nervous/anxious.     As per HPI. Otherwise, a complete review of systems is negative.  PAST MEDICAL HISTORY: Past Medical History:  Diagnosis Date   Actinic keratosis    Basal cell carcinoma 10/21/2017   left lateral neck, excised 02/09/2018   Basal cell carcinoma (BCC) 05/12/2019   right posterior ear,  excised 05/31/2019   Blood transfusion without reported diagnosis    Diabetes mellitus without complication ([place])     PAST SURGICAL HISTORY: Past Surgical History:  Procedure Laterality Date   COLONOSCOPY WITH PROPOFOL N/A 04/29/2019   Procedure: COLONOSCOPY WITH PROPOFOL;  Surgeon: Gregory Bellows, MD;  Location: [facility] ENDOSCOPY;  Service: Gastroenterology;  Laterality: N/A;   ESOPHAGOGASTRODUODENOSCOPY (EGD) WITH PROPOFOL N/A 04/29/2019   Procedure: ESOPHAGOGASTRODUODENOSCOPY (EGD) WITH PROPOFOL;  Surgeon: Gregory Bellows, MD;  Location: [facility] ENDOSCOPY;  Service: Gastroenterology;  Laterality: N/A;    FAMILY HISTORY: History reviewed. No pertinent family history.  ADVANCED DIRECTIVES (Y/N):  N  HEALTH MAINTENANCE: Social History   Tobacco Use   Smoking status: Former Smoker   Smokeless tobacco: Never Used  Scientific laboratory technician Use: Never used  Substance Use Topics   Alcohol use: Not Currently   Drug use: Never     Colonoscopy:  PAP:  Bone density:  Lipid panel:  Allergies  Allergen Reactions   Aspirin Other (See Comments)    GI bleed   Codeine Nausea Only and Nausea And Vomiting    Current Outpatient Medications  Medication Sig Dispense Refill   carvedilol (COREG) 25 MG tablet Take 25 mg by mouth 2 (two) times daily.     fenofibrate 160 MG tablet Take 160 mg by mouth daily.     ferrous sulfate 325 (65 FE) MG EC tablet Take 1 tablet (325 mg total) by mouth 2 (two) times daily. 60 tablet 0   hydrALAZINE (APRESOLINE) 50 MG tablet Take by mouth.     isosorbide mononitrate (IMDUR) 30 MG 24 hr tablet Take 30 mg by mouth every evening.  Multiple Vitamin (MULTIVITAMIN) capsule Take 1 capsule by mouth daily.     NOVOLOG MIX 70/30 FLEXPEN (70-30) 100 UNIT/ML FlexPen Inject 0.1 mLs (10 Units total) into the skin 2 (two) times daily. 15 mL 11   olmesartan-hydrochlorothiazide (BENICAR HCT) 40-25 MG tablet      terazosin (HYTRIN) 10 MG capsule Take 10 mg by mouth  every evening.      vitamin B-12 (CYANOCOBALAMIN) 1000 MCG tablet Take 1 tablet (1,000 mcg total) by mouth daily. 30 tablet 0   No current facility-administered medications for this visit.    OBJECTIVE: Vitals:   06/26/20 1332  BP: (!) 145/73  Pulse: 72  Resp: 20  Temp: 97.8 F (36.6 C)     Body mass index is 29.42 kg/m.    ECOG FS:0 - Asymptomatic  General: Well-developed, well-nourished, no acute distress. Eyes: Pink conjunctiva, anicteric sclera. HEENT: Normocephalic, moist mucous membranes. Lungs: No audible wheezing or coughing. Heart: Regular rate and rhythm. Abdomen: Soft, nontender, no obvious distention. Musculoskeletal: No edema, cyanosis, or clubbing. Neuro: Alert, answering all questions appropriately. Cranial nerves grossly intact. Skin: No rashes or petechiae noted. Psych: Normal affect.   LAB RESULTS:  Lab Results  Component Value Date   NA 138 07/05/2019   K 4.4 07/05/2019   CL 107 07/05/2019   CO2 24 07/05/2019   GLUCOSE 159 (H) 07/05/2019   BUN 40 (H) 07/05/2019   CREATININE 3.19 (H) 07/05/2019   CALCIUM 8.6 (L) 07/05/2019   PROT 5.9 (L) 04/27/2019   ALBUMIN 3.1 (L) 04/27/2019   AST 17 04/27/2019   ALT 13 04/27/2019   ALKPHOS 19 (L) 04/27/2019   BILITOT 0.5 04/27/2019   GFRNONAA 18 (L) 07/05/2019   GFRAA 20 (L) 07/05/2019    Lab Results  Component Value Date   WBC 4.2 06/26/2020   NEUTROABS 2.9 06/26/2020   HGB 9.8 (L) 06/26/2020   HCT 30.4 (L) 06/26/2020   MCV 86.9 06/26/2020   PLT 127 (L) 06/26/2020   Lab Results  Component Value Date   IRON 58 06/26/2020   TIBC 371 06/26/2020   IRONPCTSAT 16 (L) 06/26/2020   Lab Results  Component Value Date   FERRITIN 48 06/26/2020     STUDIES: US RENAL  Result Date: 06/20/2020 CLINICAL DATA:  Follow-up complex left renal cyst. EXAM: RENAL / URINARY TRACT ULTRASOUND COMPLETE COMPARISON:  05/24/2019 FINDINGS: Right Kidney: Renal measurements: 10.7 x 5.6 x 5.5 cm = volume: 171 mL.  Echogenicity within normal limits. Simple appearing upper pole right renal cyst measuring up to 1.9 cm. Additional 1.4 cm cyst within the interpolar region. 1.3 cm shadowing stone within the mid pole of the right kidney. No hydronephrosis. Left Kidney: Renal measurements: 12.0 x 6.7 x 6.7 cm = volume: 282 mL. Echogenicity within normal limits. Upper pole right renal cyst measuring 3.9 cm. Additional upper pole left renal cyst with single thin internal septation measuring 5.7 x 2.9 x 4.0 cm (previously measured 5.8 cm). No shadowing stone or hydronephrosis. Bladder: Appears normal for degree of bladder distention. Other: None. IMPRESSION: 1. Bilateral renal cysts including a stable minimally complex 5.7 cm upper pole left renal cyst. 2. Nonobstructing 1.3 cm right renal stone. 3. No hydronephrosis. Electronically Signed   By: Davina Poke D.O.   On: 06/20/2020 10:46    ASSESSMENT: Anemia associated with chronic renal failure, iron deficiency anemia.  PLAN:    1.  Anemia associated with chronic renal failure, iron deficiency anemia: EGD, virtual endoscopy, and colonoscopy in January 2021  were unrevealing, but 2 polyps were removed.  Patient's hemoglobin remains decreased and essentially unchanged at 9.8.  Previously, iron stores are mildly reduced as well.  Patient does not feel Retacrit is helping much, therefore we will hold treatment for 3 months and reassess. He will likely require IV Venofer at next clinic visit.   2.  Chronic renal insufficiency: Chronic and unchanged.  Patient's most recent creatinine is greater than 3.0.  Continue follow-up with nephrology as scheduled.   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 4.  Leukopenia: Resolved. 5.  Thrombocytopenia: Mild, monitor.   Patient expressed understanding and was in agreement with this plan. He also understands that He can call clinic at any time with any questions, concerns, or complaints.     Gregory Huger, MD   06/26/2020 2:16 PM

## 2020-06-25 ENCOUNTER — Other Ambulatory Visit: Payer: Self-pay | Admitting: *Deleted

## 2020-06-25 DIAGNOSIS — D500 Iron deficiency anemia secondary to blood loss (chronic): Secondary | ICD-10-CM

## 2020-06-26 ENCOUNTER — Inpatient Hospital Stay: Payer: Medicare Other

## 2020-06-26 ENCOUNTER — Inpatient Hospital Stay (HOSPITAL_BASED_OUTPATIENT_CLINIC_OR_DEPARTMENT_OTHER): Payer: Medicare Other | Admitting: Oncology

## 2020-06-26 ENCOUNTER — Inpatient Hospital Stay: Payer: Medicare Other | Attending: Oncology

## 2020-06-26 ENCOUNTER — Encounter: Payer: Self-pay | Admitting: Oncology

## 2020-06-26 VITALS — BP 145/73 | HR 72 | Temp 97.8°F | Resp 20 | Wt 254.6 lb

## 2020-06-26 DIAGNOSIS — D696 Thrombocytopenia, unspecified: Secondary | ICD-10-CM | POA: Insufficient documentation

## 2020-06-26 DIAGNOSIS — D631 Anemia in chronic kidney disease: Secondary | ICD-10-CM | POA: Insufficient documentation

## 2020-06-26 DIAGNOSIS — Z885 Allergy status to narcotic agent status: Secondary | ICD-10-CM | POA: Diagnosis not present

## 2020-06-26 DIAGNOSIS — N189 Chronic kidney disease, unspecified: Secondary | ICD-10-CM

## 2020-06-26 DIAGNOSIS — R339 Retention of urine, unspecified: Secondary | ICD-10-CM | POA: Insufficient documentation

## 2020-06-26 DIAGNOSIS — Z79899 Other long term (current) drug therapy: Secondary | ICD-10-CM | POA: Diagnosis not present

## 2020-06-26 DIAGNOSIS — N281 Cyst of kidney, acquired: Secondary | ICD-10-CM | POA: Diagnosis not present

## 2020-06-26 DIAGNOSIS — E611 Iron deficiency: Secondary | ICD-10-CM | POA: Diagnosis not present

## 2020-06-26 DIAGNOSIS — Z886 Allergy status to analgesic agent status: Secondary | ICD-10-CM | POA: Insufficient documentation

## 2020-06-26 DIAGNOSIS — N3289 Other specified disorders of bladder: Secondary | ICD-10-CM | POA: Insufficient documentation

## 2020-06-26 DIAGNOSIS — N200 Calculus of kidney: Secondary | ICD-10-CM | POA: Insufficient documentation

## 2020-06-26 DIAGNOSIS — Z85828 Personal history of other malignant neoplasm of skin: Secondary | ICD-10-CM | POA: Insufficient documentation

## 2020-06-26 DIAGNOSIS — D472 Monoclonal gammopathy: Secondary | ICD-10-CM | POA: Diagnosis not present

## 2020-06-26 DIAGNOSIS — Z87891 Personal history of nicotine dependence: Secondary | ICD-10-CM | POA: Insufficient documentation

## 2020-06-26 DIAGNOSIS — D500 Iron deficiency anemia secondary to blood loss (chronic): Secondary | ICD-10-CM

## 2020-06-26 LAB — CBC WITH DIFFERENTIAL/PLATELET
Abs Immature Granulocytes: 0.02 10*3/uL (ref 0.00–0.07)
Basophils Absolute: 0 10*3/uL (ref 0.0–0.1)
Basophils Relative: 1 %
Eosinophils Absolute: 0.1 10*3/uL (ref 0.0–0.5)
Eosinophils Relative: 2 %
HCT: 30.4 % — ABNORMAL LOW (ref 39.0–52.0)
Hemoglobin: 9.8 g/dL — ABNORMAL LOW (ref 13.0–17.0)
Immature Granulocytes: 1 %
Lymphocytes Relative: 20 %
Lymphs Abs: 0.8 10*3/uL (ref 0.7–4.0)
MCH: 28 pg (ref 26.0–34.0)
MCHC: 32.2 g/dL (ref 30.0–36.0)
MCV: 86.9 fL (ref 80.0–100.0)
Monocytes Absolute: 0.3 10*3/uL (ref 0.1–1.0)
Monocytes Relative: 6 %
Neutro Abs: 2.9 10*3/uL (ref 1.7–7.7)
Neutrophils Relative %: 70 %
Platelets: 127 10*3/uL — ABNORMAL LOW (ref 150–400)
RBC: 3.5 MIL/uL — ABNORMAL LOW (ref 4.22–5.81)
RDW: 14.6 % (ref 11.5–15.5)
WBC: 4.2 10*3/uL (ref 4.0–10.5)
nRBC: 0 % (ref 0.0–0.2)

## 2020-06-26 LAB — IRON AND TIBC
Iron: 58 ug/dL (ref 45–182)
Saturation Ratios: 16 % — ABNORMAL LOW (ref 17.9–39.5)
TIBC: 371 ug/dL (ref 250–450)
UIBC: 313 ug/dL

## 2020-06-26 LAB — FERRITIN: Ferritin: 48 ng/mL (ref 24–336)

## 2020-06-26 NOTE — Progress Notes (Signed)
Patient here for follow up he has had a recent fall with no significant injury.

## 2020-06-28 ENCOUNTER — Ambulatory Visit (INDEPENDENT_AMBULATORY_CARE_PROVIDER_SITE_OTHER): Payer: Medicare Other | Admitting: Urology

## 2020-06-28 ENCOUNTER — Other Ambulatory Visit: Payer: Self-pay

## 2020-06-28 ENCOUNTER — Encounter: Payer: Self-pay | Admitting: Urology

## 2020-06-28 VITALS — BP 129/55 | HR 70 | Ht 78.0 in | Wt 254.0 lb

## 2020-06-28 DIAGNOSIS — R8289 Other abnormal findings on cytological and histological examination of urine: Secondary | ICD-10-CM

## 2020-06-28 DIAGNOSIS — Z87448 Personal history of other diseases of urinary system: Secondary | ICD-10-CM

## 2020-06-28 DIAGNOSIS — N281 Cyst of kidney, acquired: Secondary | ICD-10-CM

## 2020-06-28 DIAGNOSIS — N189 Chronic kidney disease, unspecified: Secondary | ICD-10-CM | POA: Diagnosis not present

## 2020-06-28 NOTE — Progress Notes (Signed)
06/28/2020 2:41 PM   Gregory Dixon July 16, 1940 [id number]  Referring provider: Valera Castle, [address] [place],  [place] [address]  Chief Complaint  Patient presents with  . Benign Prostatic Hypertrophy    HPI: 80 year old male with a personal history of abnormal urine cytology, renal cyst who returns today for annual follow-up.  He was initially referred for follow-up of bilateral renal cyst including a large cyst on the left with a internal septation which is being followed by ultrasound.  He returns today with follow-up renal ultrasound.  He denies any flank pain.  He had incidental microscopic hematuria in the office.  He underwent cystoscopy in 07/2019.  Urine cytology was also sent which showed dysplastic cells along with dysmorphic erythrocytes.    Further work-up for dysplastic cells including cystoscopy, bilateral retrograde as well as possible biopsies and [place] cytologies were discussed.  He declined intervention.  He denies any significant urinary symptoms today.  He denies any gross hematuria.  He reports a recent fall and has an unstable gait.  His urine today is benign, no microscopic blood.  Stage IV CKD, most recent creatinine 2.97 on 07/2019   PMH: Past Medical History:  Diagnosis Date  . Actinic keratosis   . Basal cell carcinoma 10/21/2017   left lateral neck, excised 02/09/2018  . Basal cell carcinoma (BCC) 05/12/2019   right posterior ear, excised 05/31/2019  . Blood transfusion without reported diagnosis   . Diabetes mellitus without complication [facility])     Surgical History: Past Surgical History:  Procedure Laterality Date  . COLONOSCOPY WITH PROPOFOL N/A 04/29/2019   Procedure: COLONOSCOPY WITH PROPOFOL;  Surgeon: Jonathon Bellows, MD;  Location: [facility] ENDOSCOPY;  Service: Gastroenterology;  Laterality: N/A;  . ESOPHAGOGASTRODUODENOSCOPY (EGD) WITH PROPOFOL N/A 04/29/2019   Procedure: ESOPHAGOGASTRODUODENOSCOPY (EGD) WITH PROPOFOL;   Surgeon: Jonathon Bellows, MD;  Location: [facility] ENDOSCOPY;  Service: Gastroenterology;  Laterality: N/A;    Home Medications:  Allergies as of 06/28/2020      Reactions   Aspirin Other (See Comments)   GI bleed   Codeine Nausea Only, Nausea And Vomiting      Medication List       Accurate as of June 28, 2020  2:41 PM. If you have any questions, ask your nurse or doctor.        carvedilol 25 MG tablet Commonly known as: COREG Take 25 mg by mouth 2 (two) times daily.   fenofibrate 160 MG tablet Take 160 mg by mouth daily.   ferrous sulfate 325 (65 FE) MG EC tablet Take 1 tablet (325 mg total) by mouth 2 (two) times daily.   hydrALAZINE 50 MG tablet Commonly known as: APRESOLINE Take by mouth.   isosorbide mononitrate 30 MG 24 hr tablet Commonly known as: IMDUR Take 30 mg by mouth every evening.   losartan 25 MG tablet Commonly known as: COZAAR Take by mouth.   multivitamin capsule Take 1 capsule by mouth daily.   NovoLOG Mix 70/30 FlexPen (70-30) 100 UNIT/ML FlexPen Generic drug: insulin aspart protamine - aspart Inject 0.1 mLs (10 Units total) into the skin 2 (two) times daily.   olmesartan-hydrochlorothiazide 40-25 MG tablet Commonly known as: BENICAR HCT   terazosin 10 MG capsule Commonly known as: HYTRIN Take 10 mg by mouth every evening.   vitamin B-12 1000 MCG tablet Commonly known as: CYANOCOBALAMIN Take 1 tablet (1,000 mcg total) by mouth daily.       Allergies:  Allergies  Allergen Reactions  .  Aspirin Other (See Comments)    GI bleed  . Codeine Nausea Only and Nausea And Vomiting    Family History: No family history on file.  Social History:  reports that he has quit smoking. He has never used smokeless tobacco. He reports previous alcohol use. He reports that he does not use drugs.   Physical Exam: BP (!) 129/55   Pulse 70   Ht 6\' 6"  (1.981 m)   Wt 254 lb (115.2 kg)   BMI 29.35 kg/m   Constitutional:  Alert and oriented, No acute  distress.  Unstable, ambulating with cane. HEENT: [place] AT, moist mucus membranes.  Trachea midline, no masses. Cardiovascular: No clubbing, cyanosis, or edema. Respiratory: Normal respiratory effort, no increased work of breathing. Skin: No rashes, bruises or suspicious lesions. Neurologic: Grossly intact, no focal deficits, moving all 4 extremities. Psychiatric: Normal mood and affect.  Laboratory Data: Lab Results  Component Value Date   WBC 4.2 06/26/2020   HGB 9.8 (L) 06/26/2020   HCT 30.4 (L) 06/26/2020   MCV 86.9 06/26/2020   PLT 127 (L) 06/26/2020    Lab Results  Component Value Date   CREATININE 3.19 (H) 07/05/2019    Lab Results  Component Value Date   HGBA1C 5.8 (H) 04/27/2019    Urinalysis UA today reviewed, no evidence of microscopic blood  Pertinent Imaging: US RENAL  Narrative CLINICAL DATA:  Follow-up complex left renal cyst.  EXAM: RENAL / URINARY TRACT ULTRASOUND COMPLETE  COMPARISON:  05/24/2019  FINDINGS: Right Kidney:  Renal measurements: 10.7 x 5.6 x 5.5 cm = volume: 171 mL. Echogenicity within normal limits. Simple appearing upper pole right renal cyst measuring up to 1.9 cm. Additional 1.4 cm cyst within the interpolar region. 1.3 cm shadowing stone within the mid pole of the right kidney. No hydronephrosis.  Left Kidney:  Renal measurements: 12.0 x 6.7 x 6.7 cm = volume: 282 mL. Echogenicity within normal limits. Upper pole right renal cyst measuring 3.9 cm. Additional upper pole left renal cyst with single thin internal septation measuring 5.7 x 2.9 x 4.0 cm (previously measured 5.8 cm). No shadowing stone or hydronephrosis.  Bladder:  Appears normal for degree of bladder distention.  Other:  None.  IMPRESSION: 1. Bilateral renal cysts including a stable minimally complex 5.7 cm upper pole left renal cyst. 2. Nonobstructing 1.3 cm right renal stone. 3. No hydronephrosis.   Electronically Signed By: Davina Poke  D.O. On: 06/20/2020 10:46  Renal ultrasound personally reviewed t.   Assessment & Plan:    1. Abnormal urine cytology Urinary cytology repeated today  We will call him with these results, discuss further work-up if it remains dysplastic is in the past, previously declined intervention/further work-up - Cytology - Non PAP;  2. Renal cyst Stable septated renal cyst, Bosniak 2  We will continue to follow this for the time being, follow-up renal ultrasound in a year - Urinalysis, Complete  3. History of hematuria No microscopic blood in his urine this year, previous cystoscopy demonstrated sequela of chronic bladder outlet obstruction although is asymptomatic   F/u 1 year with RUS/ UA/ urine cytology  Hollice Espy, MD  [facility] [address], [place], [place] [address] [phone]

## 2020-06-29 LAB — URINALYSIS, COMPLETE
Bilirubin, UA: NEGATIVE
Ketones, UA: NEGATIVE
Leukocytes,UA: NEGATIVE
Nitrite, UA: NEGATIVE
RBC, UA: NEGATIVE
Specific Gravity, UA: 1.02 (ref 1.005–1.030)
Urobilinogen, Ur: 0.2 mg/dL (ref 0.2–1.0)
pH, UA: 5.5 (ref 5.0–7.5)

## 2020-06-29 LAB — MICROSCOPIC EXAMINATION
Bacteria, UA: NONE SEEN
Epithelial Cells (non renal): NONE SEEN /hpf (ref 0–10)

## 2020-06-29 LAB — CYTOLOGY - NON PAP

## 2020-09-26 ENCOUNTER — Inpatient Hospital Stay: Payer: Medicare Other | Attending: Oncology

## 2020-09-26 ENCOUNTER — Other Ambulatory Visit: Payer: Self-pay

## 2020-09-26 DIAGNOSIS — D696 Thrombocytopenia, unspecified: Secondary | ICD-10-CM | POA: Diagnosis not present

## 2020-09-26 DIAGNOSIS — D472 Monoclonal gammopathy: Secondary | ICD-10-CM | POA: Diagnosis not present

## 2020-09-26 DIAGNOSIS — Z79899 Other long term (current) drug therapy: Secondary | ICD-10-CM | POA: Insufficient documentation

## 2020-09-26 DIAGNOSIS — Z85828 Personal history of other malignant neoplasm of skin: Secondary | ICD-10-CM | POA: Diagnosis not present

## 2020-09-26 DIAGNOSIS — E611 Iron deficiency: Secondary | ICD-10-CM | POA: Diagnosis not present

## 2020-09-26 DIAGNOSIS — N183 Chronic kidney disease, stage 3 unspecified: Secondary | ICD-10-CM | POA: Insufficient documentation

## 2020-09-26 DIAGNOSIS — D72819 Decreased white blood cell count, unspecified: Secondary | ICD-10-CM | POA: Diagnosis not present

## 2020-09-26 DIAGNOSIS — N189 Chronic kidney disease, unspecified: Secondary | ICD-10-CM

## 2020-09-26 DIAGNOSIS — D631 Anemia in chronic kidney disease: Secondary | ICD-10-CM

## 2020-09-26 LAB — CBC WITH DIFFERENTIAL/PLATELET
Abs Immature Granulocytes: 0.03 10*3/uL (ref 0.00–0.07)
Basophils Absolute: 0 10*3/uL (ref 0.0–0.1)
Basophils Relative: 0 %
Eosinophils Absolute: 0.1 10*3/uL (ref 0.0–0.5)
Eosinophils Relative: 3 %
HCT: 26.4 % — ABNORMAL LOW (ref 39.0–52.0)
Hemoglobin: 8.4 g/dL — ABNORMAL LOW (ref 13.0–17.0)
Immature Granulocytes: 1 %
Lymphocytes Relative: 24 %
Lymphs Abs: 0.8 10*3/uL (ref 0.7–4.0)
MCH: 29.3 pg (ref 26.0–34.0)
MCHC: 31.8 g/dL (ref 30.0–36.0)
MCV: 92 fL (ref 80.0–100.0)
Monocytes Absolute: 0.2 10*3/uL (ref 0.1–1.0)
Monocytes Relative: 6 %
Neutro Abs: 2.2 10*3/uL (ref 1.7–7.7)
Neutrophils Relative %: 66 %
Platelets: 125 10*3/uL — ABNORMAL LOW (ref 150–400)
RBC: 2.87 MIL/uL — ABNORMAL LOW (ref 4.22–5.81)
RDW: 15.3 % (ref 11.5–15.5)
WBC: 3.2 10*3/uL — ABNORMAL LOW (ref 4.0–10.5)
nRBC: 0 % (ref 0.0–0.2)

## 2020-09-26 LAB — IRON AND TIBC
Iron: 36 ug/dL — ABNORMAL LOW (ref 45–182)
Saturation Ratios: 9 % — ABNORMAL LOW (ref 17.9–39.5)
TIBC: 388 ug/dL (ref 250–450)
UIBC: 352 ug/dL

## 2020-09-26 LAB — FERRITIN: Ferritin: 25 ng/mL (ref 24–336)

## 2020-09-27 ENCOUNTER — Encounter: Payer: Self-pay | Admitting: Oncology

## 2020-09-27 ENCOUNTER — Inpatient Hospital Stay: Payer: Medicare Other

## 2020-09-27 ENCOUNTER — Inpatient Hospital Stay (HOSPITAL_BASED_OUTPATIENT_CLINIC_OR_DEPARTMENT_OTHER): Payer: Medicare Other | Admitting: Oncology

## 2020-09-27 VITALS — BP 172/68 | HR 67 | Temp 97.6°F | Resp 20 | Wt 252.7 lb

## 2020-09-27 VITALS — BP 147/53 | HR 57 | Resp 16

## 2020-09-27 DIAGNOSIS — D631 Anemia in chronic kidney disease: Secondary | ICD-10-CM

## 2020-09-27 DIAGNOSIS — N189 Chronic kidney disease, unspecified: Secondary | ICD-10-CM

## 2020-09-27 DIAGNOSIS — D500 Iron deficiency anemia secondary to blood loss (chronic): Secondary | ICD-10-CM | POA: Diagnosis not present

## 2020-09-27 DIAGNOSIS — N183 Chronic kidney disease, stage 3 unspecified: Secondary | ICD-10-CM | POA: Diagnosis not present

## 2020-09-27 MED ORDER — SODIUM CHLORIDE 0.9 % IV SOLN
Freq: Once | INTRAVENOUS | Status: AC
Start: 2020-09-27 — End: 2020-09-27
  Filled 2020-09-27: qty 250

## 2020-09-27 MED ORDER — IRON SUCROSE 20 MG/ML IV SOLN
200.0000 mg | Freq: Once | INTRAVENOUS | Status: AC
  Administered 2020-09-27: 200 mg via INTRAVENOUS
  Filled 2020-09-27: qty 10

## 2020-09-27 NOTE — Patient Instructions (Signed)

## 2020-09-27 NOTE — Progress Notes (Signed)
Patient denies any concerns today.  

## 2020-09-29 NOTE — Progress Notes (Signed)
[facility]  Telephone:(336) [phone] Fax:(336) [phone]  ID: Herold Harms OB: 08-08-1940  MR#: [id number]  TDS#:[id number]  Patient Care Team: Valera Castle, MD as PCP - General (Family Medicine) Lloyd Huger, MD as Consulting Physician (Oncology)  CHIEF COMPLAINT: Anemia associated with chronic renal failure, iron deficiency anemia.  INTERVAL HISTORY: Patient returns to clinic today for repeat laboratory work, further evaluation, and consideration of IV Venofer.  He continues to feel well and remains asymptomatic.  He does not complain of any weakness or fatigue. He has no neurologic complaints.  He denies any recent fevers or illnesses.  He has a good appetite and denies weight loss.  He has no chest pain, shortness of breath, cough, or hemoptysis.  He denies any nausea, vomiting, constipation, or diarrhea.  He denies any melena or hematochezia.  He has no urinary complaints.  Patient offers no specific complaints today.    REVIEW OF SYSTEMS:   Review of Systems  Constitutional: Negative.  Negative for fever, malaise/fatigue and weight loss.  Respiratory: Negative.  Negative for cough and hemoptysis.   Cardiovascular: Negative.  Negative for chest pain and leg swelling.  Gastrointestinal: Negative.  Negative for abdominal pain, blood in stool and melena.  Genitourinary: Negative.  Negative for hematuria.  Musculoskeletal: Negative.  Negative for back pain.  Skin: Negative.  Negative for rash.  Neurological: Negative.  Negative for dizziness, focal weakness, weakness and headaches.  Psychiatric/Behavioral: Negative.  The patient is not nervous/anxious.    As per HPI. Otherwise, a complete review of systems is negative.  PAST MEDICAL HISTORY: Past Medical History:  Diagnosis Date   Actinic keratosis    Basal cell carcinoma 10/21/2017   left lateral neck, excised 02/09/2018   Basal cell carcinoma (BCC) 05/12/2019   right posterior ear, excised  05/31/2019   Blood transfusion without reported diagnosis    Diabetes mellitus without complication ([place])     PAST SURGICAL HISTORY: Past Surgical History:  Procedure Laterality Date   COLONOSCOPY WITH PROPOFOL N/A 04/29/2019   Procedure: COLONOSCOPY WITH PROPOFOL;  Surgeon: Jonathon Bellows, MD;  Location: [facility] ENDOSCOPY;  Service: Gastroenterology;  Laterality: N/A;   ESOPHAGOGASTRODUODENOSCOPY (EGD) WITH PROPOFOL N/A 04/29/2019   Procedure: ESOPHAGOGASTRODUODENOSCOPY (EGD) WITH PROPOFOL;  Surgeon: Jonathon Bellows, MD;  Location: [facility] ENDOSCOPY;  Service: Gastroenterology;  Laterality: N/A;    FAMILY HISTORY: History reviewed. No pertinent family history.  ADVANCED DIRECTIVES (Y/N):  N  HEALTH MAINTENANCE: Social History   Tobacco Use   Smoking status: Former    Pack years: 0.00   Smokeless tobacco: Never  Vaping Use   Vaping Use: Never used  Substance Use Topics   Alcohol use: Not Currently   Drug use: Never     Colonoscopy:  PAP:  Bone density:  Lipid panel:  Allergies  Allergen Reactions   Aspirin Other (See Comments)    GI bleed   Codeine Nausea Only and Nausea And Vomiting    Current Outpatient Medications  Medication Sig Dispense Refill   carvedilol (COREG) 25 MG tablet Take 25 mg by mouth 2 (two) times daily.     fenofibrate 160 MG tablet Take 160 mg by mouth daily.     hydrALAZINE (APRESOLINE) 50 MG tablet Take by mouth.     isosorbide mononitrate (IMDUR) 30 MG 24 hr tablet Take 30 mg by mouth every evening.      losartan (COZAAR) 25 MG tablet Take by mouth.     Multiple Vitamin (MULTIVITAMIN) capsule Take 1 capsule by  mouth daily.     NOVOLOG MIX 70/30 FLEXPEN (70-30) 100 UNIT/ML FlexPen Inject 0.1 mLs (10 Units total) into the skin 2 (two) times daily. 15 mL 11   olmesartan-hydrochlorothiazide (BENICAR HCT) 40-25 MG tablet      terazosin (HYTRIN) 10 MG capsule Take 10 mg by mouth every evening.      vitamin B-12 (CYANOCOBALAMIN) 1000 MCG tablet Take 1 tablet  (1,000 mcg total) by mouth daily. 30 tablet 0   ferrous sulfate 325 (65 FE) MG EC tablet Take 1 tablet (325 mg total) by mouth 2 (two) times daily. 60 tablet 0   No current facility-administered medications for this visit.    OBJECTIVE: Vitals:   09/27/20 1344  BP: (!) 172/68  Pulse: 67  Resp: 20  Temp: 97.6 F (36.4 C)     Body mass index is 29.2 kg/m.    ECOG FS:0 - Asymptomatic  General: Well-developed, well-nourished, no acute distress. Eyes: Pink conjunctiva, anicteric sclera. HEENT: Normocephalic, moist mucous membranes. Lungs: No audible wheezing or coughing. Heart: Regular rate and rhythm. Abdomen: Soft, nontender, no obvious distention. Musculoskeletal: No edema, cyanosis, or clubbing. Neuro: Alert, answering all questions appropriately. Cranial nerves grossly intact. Skin: No rashes or petechiae noted. Psych: Normal affect.   LAB RESULTS:  Lab Results  Component Value Date   NA 138 07/05/2019   K 4.4 07/05/2019   CL 107 07/05/2019   CO2 24 07/05/2019   GLUCOSE 159 (H) 07/05/2019   BUN 40 (H) 07/05/2019   CREATININE 3.19 (H) 07/05/2019   CALCIUM 8.6 (L) 07/05/2019   PROT 5.9 (L) 04/27/2019   ALBUMIN 3.1 (L) 04/27/2019   AST 17 04/27/2019   ALT 13 04/27/2019   ALKPHOS 19 (L) 04/27/2019   BILITOT 0.5 04/27/2019   GFRNONAA 18 (L) 07/05/2019   GFRAA 20 (L) 07/05/2019    Lab Results  Component Value Date   WBC 3.2 (L) 09/26/2020   NEUTROABS 2.2 09/26/2020   HGB 8.4 (L) 09/26/2020   HCT 26.4 (L) 09/26/2020   MCV 92.0 09/26/2020   PLT 125 (L) 09/26/2020   Lab Results  Component Value Date   IRON 36 (L) 09/26/2020   TIBC 388 09/26/2020   IRONPCTSAT 9 (L) 09/26/2020   Lab Results  Component Value Date   FERRITIN 25 09/26/2020     STUDIES: No results found.   ASSESSMENT: Anemia associated with chronic renal failure, iron deficiency anemia.  PLAN:    1.  Anemia associated with chronic renal failure, iron deficiency anemia: EGD, virtual  endoscopy, and colonoscopy in January 2021 were unrevealing, but 2 polyps were removed.  Patient's hemoglobin has trended down and he now once again has decreased iron stores.  Proceed with 200 mg IV Venofer today.  Return to clinic 4 times over the next 2 weeks for additional IV iron.  Previously, patient did not feel Retacrit offers much benefit.  Return to clinic in 3 months with repeat laboratory work, further evaluation, and consideration of treatment if needed.   2.  Chronic renal insufficiency: Chronic and unchanged.  Patient's most recent creatinine is greater than 3.0.  Continue follow-up with nephrology as scheduled.   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 4.  Leukopenia: Chronic and unchanged.  Patient's white count is 3.2 today. 5.  Thrombocytopenia: Chronic and unchanged.  Patient's platelet count is 125 today.   Patient expressed understanding and was in agreement with this plan. He also understands  that He can call clinic at any time with any questions, concerns, or complaints.    Lloyd Huger, MD   09/29/2020 2:31 PM

## 2020-10-02 ENCOUNTER — Inpatient Hospital Stay: Payer: Medicare Other

## 2020-10-02 VITALS — BP 125/52 | HR 65 | Temp 96.6°F | Resp 18

## 2020-10-02 DIAGNOSIS — D500 Iron deficiency anemia secondary to blood loss (chronic): Secondary | ICD-10-CM

## 2020-10-02 DIAGNOSIS — N183 Chronic kidney disease, stage 3 unspecified: Secondary | ICD-10-CM | POA: Diagnosis not present

## 2020-10-02 MED ORDER — SODIUM CHLORIDE 0.9 % IV SOLN
Freq: Once | INTRAVENOUS | Status: AC
Start: 2020-10-02 — End: 2020-10-02
  Filled 2020-10-02: qty 250

## 2020-10-02 MED ORDER — IRON SUCROSE 20 MG/ML IV SOLN
200.0000 mg | Freq: Once | INTRAVENOUS | Status: AC
Start: 2020-10-02 — End: 2020-10-02
  Administered 2020-10-02: 200 mg via INTRAVENOUS
  Filled 2020-10-02: qty 10

## 2020-10-02 NOTE — Patient Instructions (Signed)
[facility] C[facility] [place] [facility]   Discharge Instructions: Thank you for choosing [facility] to provide your oncology and hematology care.  If you have a lab appointment with the Cancer Center, please go directly to the Cancer Center and check in at the registration area.  We strive to give you quality time with your provider. You may need to reschedule your appointment if you arrive late (15 or more minutes).  Arriving late affects you and other patients whose appointments are after yours.  Also, if you miss three or more appointments without notifying the office, you may be dismissed from the clinic at the provider's discretion.      For prescription refill requests, have your pharmacy contact our office and allow 72 hours for refills to be completed.    Today you received the following: Venofer.      BELOW ARE SYMPTOMS THAT SHOULD BE REPORTED IMMEDIATELY: *FEVER GREATER THAN 100.4 F (38 C) OR HIGHER *CHILLS OR SWEATING *NAUSEA AND VOMITING THAT IS NOT CONTROLLED WITH YOUR NAUSEA MEDICATION *UNUSUAL SHORTNESS OF BREATH *UNUSUAL BRUISING OR BLEEDING *URINARY PROBLEMS (pain or burning when urinating, or frequent urination) *BOWEL PROBLEMS (unusual diarrhea, constipation, pain near the anus) TENDERNESS IN MOUTH AND THROAT WITH OR WITHOUT PRESENCE OF ULCERS (sore throat, sores in mouth, or a toothache) UNUSUAL RASH, SWELLING OR PAIN  UNUSUAL VAGINAL DISCHARGE OR ITCHING   Items with * indicate a potential emergency and should be followed up as soon as possible or go to the Emergency Department if any problems should occur.  Should you have questions after your visit or need to cancel or reschedule your appointment, please contact [facility] [facility]  [phone] and follow the prompts.  Office hours are 8:00 a.m. to 4:30 p.m. Monday - Friday. Please note that voicemails left after 4:00 p.m. may not be returned until the following  business day.  We are closed weekends and major holidays. You have access to a nurse at all times for urgent questions. Please call the main number to the clinic [phone] and follow the prompts.  For any non-urgent questions, you may also contact your provider using MyChart. We now offer e-Visits for anyone 18 and older to request care online for non-urgent symptoms. For details visit [email or web address].   Also download the MyChart app! Go to the app store, search "MyChart", open the app, select , and log in with your MyChart username and password.  Due to Covid, a mask is required upon entering the hospital/clinic. If you do not have a mask, one will be given to you upon arrival. For doctor visits, patients may have 1 support person aged 18 or older with them. For treatment visits, patients cannot have anyone with them due to current Covid guidelines and our immunocompromised population.  

## 2020-10-04 ENCOUNTER — Inpatient Hospital Stay: Payer: Medicare Other

## 2020-10-04 VITALS — BP 141/55 | HR 55 | Temp 96.4°F | Resp 18

## 2020-10-04 DIAGNOSIS — D500 Iron deficiency anemia secondary to blood loss (chronic): Secondary | ICD-10-CM

## 2020-10-04 DIAGNOSIS — N183 Chronic kidney disease, stage 3 unspecified: Secondary | ICD-10-CM | POA: Diagnosis not present

## 2020-10-04 MED ORDER — IRON SUCROSE 20 MG/ML IV SOLN
200.0000 mg | Freq: Once | INTRAVENOUS | Status: AC
  Administered 2020-10-04: 200 mg via INTRAVENOUS
  Filled 2020-10-04: qty 10

## 2020-10-04 MED ORDER — SODIUM CHLORIDE 0.9 % IV SOLN
INTRAVENOUS | Status: DC
  Filled 2020-10-04: qty 250

## 2020-10-04 NOTE — Patient Instructions (Signed)
[facility]    Discharge Instructions: Thank you for choosing [facility] to provide your oncology and hematology care.  If you have a lab appointment with the [facility], please go directly to the [facility] and check in at the registration area.  Wear comfortable clothing and clothing appropriate for easy access to any Portacath or PICC line.   We strive to give you quality time with your provider. You may need to reschedule your appointment if you arrive late (15 or more minutes).  Arriving late affects you and other patients whose appointments are after yours.  Also, if you miss three or more appointments without notifying the office, you may be dismissed from the clinic at the provider's discretion.      For prescription refill requests, have your pharmacy contact our office and allow 72 hours for refills to be completed.    BELOW ARE SYMPTOMS THAT SHOULD BE REPORTED IMMEDIATELY: *FEVER GREATER THAN 100.4 F (38 C) OR HIGHER *CHILLS OR SWEATING *NAUSEA AND VOMITING THAT IS NOT CONTROLLED WITH YOUR NAUSEA MEDICATION *UNUSUAL SHORTNESS OF BREATH *UNUSUAL BRUISING OR BLEEDING *URINARY PROBLEMS (pain or burning when urinating, or frequent urination) *BOWEL PROBLEMS (unusual diarrhea, constipation, pain near the anus) TENDERNESS IN MOUTH AND THROAT WITH OR WITHOUT PRESENCE OF ULCERS (sore throat, sores in mouth, or a toothache) UNUSUAL RASH, SWELLING OR PAIN  UNUSUAL VAGINAL DISCHARGE OR ITCHING   Items with * indicate a potential emergency and should be followed up as soon as possible or go to the Emergency Department if any problems should occur.  Please show the CHEMOTHERAPY ALERT CARD or IMMUNOTHERAPY ALERT CARD at check-in to the Emergency Department and triage nurse.  Should you have questions after your visit or need to cancel or reschedule your appointment, please contact [facility]   [phone] and follow the prompts.  Office hours are 8:00 a.m. to 4:30 p.m. Monday - Friday. Please note that voicemails left after 4:00 p.m. may not be returned until the following business day.  We are closed weekends and major holidays. You have access to a nurse at all times for urgent questions. Please call the main number to the clinic [phone] and follow the prompts.  For any non-urgent questions, you may also contact your provider using MyChart. We now offer e-Visits for anyone 34 and older to request care online for non-urgent symptoms. For details visit [email or web address].   Also download the MyChart app! Go to the app store, search "MyChart", open the app, select [facility], and log in with your MyChart username and password.  Due to Covid, a mask is required upon entering the hospital/clinic. If you do not have a mask, one will be given to you upon arrival. For doctor visits, patients may have 1 support person aged 48 or older with them. For treatment visits, patients cannot have anyone with them due to current Covid guidelines and our immunocompromised population.   Iron Sucrose injection What is this medication? IRON SUCROSE (AHY ern SOO krohs) is an iron complex. Iron is used to make healthy red blood cells, which carry oxygen and nutrients throughout the body. This medicine is used to treat iron deficiency anemia in people with chronickidney disease. This medicine may be used for other purposes; ask your health care provider orpharmacist if you have questions. COMMON BRAND NAME(S): Venofer What should I tell my care team before I take this medication? They need to know if you  have any of these conditions: anemia not caused by low iron levels heart disease high levels of iron in the blood kidney disease liver disease an unusual or allergic reaction to iron, other medicines, foods, dyes, or preservatives pregnant or trying to get pregnant breast-feeding How should I  use this medication? This medicine is for infusion into a vein. It is given by a health careprofessional in a hospital or clinic setting. Talk to your pediatrician regarding the use of this medicine in children. While this drug may be prescribed for children as young as 2 years for selectedconditions, precautions do apply. Overdosage: If you think you have taken too much of this medicine contact apoison control center or emergency room at once. NOTE: This medicine is only for you. Do not share this medicine with others. What if I miss a dose? It is important not to miss your dose. Call your doctor or health careprofessional if you are unable to keep an appointment. What may interact with this medication? Do not take this medicine with any of the following medications: deferoxamine dimercaprol other iron products This medicine may also interact with the following medications: chloramphenicol deferasirox This list may not describe all possible interactions. Give your health care provider a list of all the medicines, herbs, non-prescription drugs, or dietary supplements you use. Also tell them if you smoke, drink alcohol, or use illegaldrugs. Some items may interact with your medicine. What should I watch for while using this medication? Visit your doctor or healthcare professional regularly. Tell your doctor or healthcare professional if your symptoms do not start to get better or if theyget worse. You may need blood work done while you are taking this medicine. You may need to follow a special diet. Talk to your doctor. Foods that contain iron include: whole grains/cereals, dried fruits, beans, or peas, leafy greenvegetables, and organ meats (liver, kidney). What side effects may I notice from receiving this medication? Side effects that you should report to your doctor or health care professionalas soon as possible: allergic reactions like skin rash, itching or hives, swelling of the face, lips,  or tongue breathing problems changes in blood pressure cough fast, irregular heartbeat feeling faint or lightheaded, falls fever or chills flushing, sweating, or hot feelings joint or muscle aches/pains seizures swelling of the ankles or feet unusually weak or tired Side effects that usually do not require medical attention (report to yourdoctor or health care professional if they continue or are bothersome): diarrhea feeling achy headache irritation at site where injected nausea, vomiting stomach upset tiredness This list may not describe all possible side effects. Call your doctor for medical advice about side effects. You may report side effects to FDA at1-800-FDA-1088. Where should I keep my medication? This drug is given in a hospital or clinic and will not be stored at home. NOTE: This sheet is a summary. It may not cover all possible information. If you have questions about this medicine, talk to your doctor, pharmacist, orhealth care provider.  2022 Elsevier/Gold Standard (2011-01-02 17:14:35)

## 2020-10-09 ENCOUNTER — Inpatient Hospital Stay: Payer: Medicare Other | Attending: Oncology

## 2020-10-09 VITALS — BP 142/62 | HR 62 | Temp 96.6°F | Resp 16

## 2020-10-09 DIAGNOSIS — D631 Anemia in chronic kidney disease: Secondary | ICD-10-CM | POA: Diagnosis present

## 2020-10-09 DIAGNOSIS — D500 Iron deficiency anemia secondary to blood loss (chronic): Secondary | ICD-10-CM

## 2020-10-09 DIAGNOSIS — N189 Chronic kidney disease, unspecified: Secondary | ICD-10-CM | POA: Insufficient documentation

## 2020-10-09 MED ORDER — IRON SUCROSE 20 MG/ML IV SOLN
200.0000 mg | Freq: Once | INTRAVENOUS | Status: AC
  Administered 2020-10-09: 200 mg via INTRAVENOUS
  Filled 2020-10-09: qty 10

## 2020-10-09 MED ORDER — SODIUM CHLORIDE 0.9 % IV SOLN
INTRAVENOUS | Status: DC
  Filled 2020-10-09: qty 250

## 2020-10-11 ENCOUNTER — Inpatient Hospital Stay: Payer: Medicare Other

## 2020-10-11 VITALS — BP 151/86 | HR 60 | Temp 97.2°F | Resp 18

## 2020-10-11 DIAGNOSIS — D500 Iron deficiency anemia secondary to blood loss (chronic): Secondary | ICD-10-CM

## 2020-10-11 DIAGNOSIS — N189 Chronic kidney disease, unspecified: Secondary | ICD-10-CM | POA: Diagnosis not present

## 2020-10-11 MED ORDER — IRON SUCROSE 20 MG/ML IV SOLN
200.0000 mg | Freq: Once | INTRAVENOUS | Status: AC
  Administered 2020-10-11: 200 mg via INTRAVENOUS
  Filled 2020-10-11: qty 10

## 2020-10-11 MED ORDER — SODIUM CHLORIDE 0.9 % IV SOLN
INTRAVENOUS | Status: DC
  Filled 2020-10-11: qty 250

## 2020-10-11 NOTE — Patient Instructions (Signed)
[facility]   Discharge Instructions: Thank you for choosing [facility] to provide your oncology and hematology care.  If you have a lab appointment with the [facility], please go directly to the [facility] and check in at the registration area.  We strive to give you quality time with your provider. You may need to reschedule your appointment if you arrive late (15 or more minutes).  Arriving late affects you and other patients whose appointments are after yours.  Also, if you miss three or more appointments without notifying the office, you may be dismissed from the clinic at the provider's discretion.      For prescription refill requests, have your pharmacy contact our office and allow 72 hours for refills to be completed.    Today you received the following: Venofer.      To help prevent nausea and vomiting after your treatment, we encourage you to take your nausea medication as directed.  BELOW ARE SYMPTOMS THAT SHOULD BE REPORTED IMMEDIATELY: *FEVER GREATER THAN 100.4 F (38 C) OR HIGHER *CHILLS OR SWEATING *NAUSEA AND VOMITING THAT IS NOT CONTROLLED WITH YOUR NAUSEA MEDICATION *UNUSUAL SHORTNESS OF BREATH *UNUSUAL BRUISING OR BLEEDING *URINARY PROBLEMS (pain or burning when urinating, or frequent urination) *BOWEL PROBLEMS (unusual diarrhea, constipation, pain near the anus) TENDERNESS IN MOUTH AND THROAT WITH OR WITHOUT PRESENCE OF ULCERS (sore throat, sores in mouth, or a toothache) UNUSUAL RASH, SWELLING OR PAIN  UNUSUAL VAGINAL DISCHARGE OR ITCHING   Items with * indicate a potential emergency and should be followed up as soon as possible or go to the Emergency Department if any problems should occur.  Should you have questions after your visit or need to cancel or reschedule your appointment, please contact [facility]  [phone] and follow the prompts.  Office hours are 8:00 a.m.  to 4:30 p.m. Monday - Friday. Please note that voicemails left after 4:00 p.m. may not be returned until the following business day.  We are closed weekends and major holidays. You have access to a nurse at all times for urgent questions. Please call the main number to the clinic [phone] and follow the prompts.  For any non-urgent questions, you may also contact your provider using MyChart. We now offer e-Visits for anyone 39 and older to request care online for non-urgent symptoms. For details visit [email or web address].   Also download the MyChart app! Go to the app store, search "MyChart", open the app, select [facility], and log in with your MyChart username and password.  Due to Covid, a mask is required upon entering the hospital/clinic. If you do not have a mask, one will be given to you upon arrival. For doctor visits, patients may have 1 support person aged 56 or older with them. For treatment visits, patients cannot have anyone with them due to current Covid guidelines and our immunocompromised population.

## 2020-11-23 ENCOUNTER — Other Ambulatory Visit: Payer: Self-pay | Admitting: Hospice and Palliative Medicine

## 2020-11-23 ENCOUNTER — Inpatient Hospital Stay: Payer: Medicare Other | Attending: Hospice and Palliative Medicine | Admitting: Hospice and Palliative Medicine

## 2020-11-23 ENCOUNTER — Other Ambulatory Visit: Payer: Self-pay | Admitting: *Deleted

## 2020-11-23 ENCOUNTER — Encounter: Payer: Self-pay | Admitting: Family Medicine

## 2020-11-23 ENCOUNTER — Inpatient Hospital Stay
Admission: AD | Admit: 2020-11-23 | Discharge: 2020-11-25 | DRG: 812 | Disposition: A | Discharge status: Home / Self Care | Payer: Medicare Other | Source: Ambulatory Visit | Attending: Internal Medicine | Admitting: Internal Medicine

## 2020-11-23 ENCOUNTER — Other Ambulatory Visit: Payer: Self-pay | Admitting: Oncology

## 2020-11-23 ENCOUNTER — Inpatient Hospital Stay: Payer: Medicare Other

## 2020-11-23 ENCOUNTER — Encounter: Payer: Self-pay | Admitting: Hospice and Palliative Medicine

## 2020-11-23 ENCOUNTER — Other Ambulatory Visit: Payer: Self-pay

## 2020-11-23 VITALS — BP 114/39 | HR 70 | Temp 98.7°F | Wt 243.0 lb

## 2020-11-23 DIAGNOSIS — D72819 Decreased white blood cell count, unspecified: Secondary | ICD-10-CM | POA: Insufficient documentation

## 2020-11-23 DIAGNOSIS — Z885 Allergy status to narcotic agent status: Secondary | ICD-10-CM | POA: Insufficient documentation

## 2020-11-23 DIAGNOSIS — R42 Dizziness and giddiness: Secondary | ICD-10-CM | POA: Insufficient documentation

## 2020-11-23 DIAGNOSIS — Z79899 Other long term (current) drug therapy: Secondary | ICD-10-CM | POA: Insufficient documentation

## 2020-11-23 DIAGNOSIS — R0609 Other forms of dyspnea: Secondary | ICD-10-CM | POA: Diagnosis present

## 2020-11-23 DIAGNOSIS — D519 Vitamin B12 deficiency anemia, unspecified: Secondary | ICD-10-CM

## 2020-11-23 DIAGNOSIS — K649 Unspecified hemorrhoids: Secondary | ICD-10-CM | POA: Diagnosis present

## 2020-11-23 DIAGNOSIS — D500 Iron deficiency anemia secondary to blood loss (chronic): Principal | ICD-10-CM | POA: Diagnosis present

## 2020-11-23 DIAGNOSIS — N179 Acute kidney failure, unspecified: Secondary | ICD-10-CM | POA: Diagnosis present

## 2020-11-23 DIAGNOSIS — Z794 Long term (current) use of insulin: Secondary | ICD-10-CM | POA: Diagnosis not present

## 2020-11-23 DIAGNOSIS — Z886 Allergy status to analgesic agent status: Secondary | ICD-10-CM

## 2020-11-23 DIAGNOSIS — D696 Thrombocytopenia, unspecified: Secondary | ICD-10-CM | POA: Diagnosis not present

## 2020-11-23 DIAGNOSIS — E538 Deficiency of other specified B group vitamins: Secondary | ICD-10-CM | POA: Diagnosis present

## 2020-11-23 DIAGNOSIS — I129 Hypertensive chronic kidney disease with stage 1 through stage 4 chronic kidney disease, or unspecified chronic kidney disease: Secondary | ICD-10-CM | POA: Diagnosis present

## 2020-11-23 DIAGNOSIS — N189 Chronic kidney disease, unspecified: Secondary | ICD-10-CM | POA: Insufficient documentation

## 2020-11-23 DIAGNOSIS — Z85828 Personal history of other malignant neoplasm of skin: Secondary | ICD-10-CM | POA: Insufficient documentation

## 2020-11-23 DIAGNOSIS — Z20822 Contact with and (suspected) exposure to covid-19: Secondary | ICD-10-CM | POA: Diagnosis present

## 2020-11-23 DIAGNOSIS — E1122 Type 2 diabetes mellitus with diabetic chronic kidney disease: Secondary | ICD-10-CM | POA: Diagnosis present

## 2020-11-23 DIAGNOSIS — E785 Hyperlipidemia, unspecified: Secondary | ICD-10-CM | POA: Diagnosis present

## 2020-11-23 DIAGNOSIS — E611 Iron deficiency: Secondary | ICD-10-CM | POA: Insufficient documentation

## 2020-11-23 DIAGNOSIS — I10 Essential (primary) hypertension: Secondary | ICD-10-CM | POA: Diagnosis not present

## 2020-11-23 DIAGNOSIS — Z8719 Personal history of other diseases of the digestive system: Secondary | ICD-10-CM | POA: Insufficient documentation

## 2020-11-23 DIAGNOSIS — R531 Weakness: Secondary | ICD-10-CM | POA: Diagnosis present

## 2020-11-23 DIAGNOSIS — D631 Anemia in chronic kidney disease: Secondary | ICD-10-CM | POA: Insufficient documentation

## 2020-11-23 DIAGNOSIS — Z87891 Personal history of nicotine dependence: Secondary | ICD-10-CM | POA: Diagnosis not present

## 2020-11-23 DIAGNOSIS — N184 Chronic kidney disease, stage 4 (severe): Secondary | ICD-10-CM | POA: Diagnosis present

## 2020-11-23 DIAGNOSIS — D649 Anemia, unspecified: Secondary | ICD-10-CM | POA: Diagnosis present

## 2020-11-23 DIAGNOSIS — D472 Monoclonal gammopathy: Secondary | ICD-10-CM | POA: Diagnosis present

## 2020-11-23 DIAGNOSIS — N2581 Secondary hyperparathyroidism of renal origin: Secondary | ICD-10-CM | POA: Diagnosis present

## 2020-11-23 DIAGNOSIS — Z86010 Personal history of colonic polyps: Secondary | ICD-10-CM

## 2020-11-23 DIAGNOSIS — N400 Enlarged prostate without lower urinary tract symptoms: Secondary | ICD-10-CM | POA: Insufficient documentation

## 2020-11-23 DIAGNOSIS — R195 Other fecal abnormalities: Secondary | ICD-10-CM | POA: Diagnosis present

## 2020-11-23 DIAGNOSIS — R5383 Other fatigue: Secondary | ICD-10-CM | POA: Insufficient documentation

## 2020-11-23 LAB — CBC WITH DIFFERENTIAL/PLATELET
Abs Immature Granulocytes: 0.11 10*3/uL — ABNORMAL HIGH (ref 0.00–0.07)
Basophils Absolute: 0 10*3/uL (ref 0.0–0.1)
Basophils Relative: 0 %
Eosinophils Absolute: 0.1 10*3/uL (ref 0.0–0.5)
Eosinophils Relative: 2 %
HCT: 17.8 % — ABNORMAL LOW (ref 39.0–52.0)
Hemoglobin: 5.7 g/dL — ABNORMAL LOW (ref 13.0–17.0)
Immature Granulocytes: 2 %
Lymphocytes Relative: 23 %
Lymphs Abs: 1.2 10*3/uL (ref 0.7–4.0)
MCH: 30 pg (ref 26.0–34.0)
MCHC: 32 g/dL (ref 30.0–36.0)
MCV: 93.7 fL (ref 80.0–100.0)
Monocytes Absolute: 0.3 10*3/uL (ref 0.1–1.0)
Monocytes Relative: 6 %
Neutro Abs: 3.6 10*3/uL (ref 1.7–7.7)
Neutrophils Relative %: 67 %
Platelets: 151 10*3/uL (ref 150–400)
RBC: 1.9 MIL/uL — ABNORMAL LOW (ref 4.22–5.81)
RDW: 15.8 % — ABNORMAL HIGH (ref 11.5–15.5)
WBC: 5.3 10*3/uL (ref 4.0–10.5)
nRBC: 0 % (ref 0.0–0.2)

## 2020-11-23 LAB — PREPARE RBC (CROSSMATCH)

## 2020-11-23 LAB — COMPREHENSIVE METABOLIC PANEL
ALT: 10 U/L (ref 0–44)
AST: 16 U/L (ref 15–41)
Albumin: 2.9 g/dL — ABNORMAL LOW (ref 3.5–5.0)
Alkaline Phosphatase: 18 U/L — ABNORMAL LOW (ref 38–126)
Anion gap: 7 (ref 5–15)
BUN: 53 mg/dL — ABNORMAL HIGH (ref 8–23)
CO2: 23 mmol/L (ref 22–32)
Calcium: 8.2 mg/dL — ABNORMAL LOW (ref 8.9–10.3)
Chloride: 109 mmol/L (ref 98–111)
Creatinine, Ser: 4.88 mg/dL — ABNORMAL HIGH (ref 0.61–1.24)
GFR, Estimated: 11 mL/min — ABNORMAL LOW (ref >60)
Glucose, Bld: 104 mg/dL — ABNORMAL HIGH (ref 70–99)
Potassium: 3.8 mmol/L (ref 3.5–5.1)
Sodium: 139 mmol/L (ref 135–145)
Total Bilirubin: 0.4 mg/dL (ref 0.3–1.2)
Total Protein: 5.3 g/dL — ABNORMAL LOW (ref 6.5–8.1)

## 2020-11-23 LAB — RESP PANEL BY RT-PCR (FLU A&B, COVID) ARPGX2
Influenza A by PCR: NEGATIVE
Influenza B by PCR: NEGATIVE
SARS Coronavirus 2 by RT PCR: NEGATIVE

## 2020-11-23 LAB — HEMOGLOBIN AND HEMATOCRIT, BLOOD
HCT: 24 % — ABNORMAL LOW (ref 39.0–52.0)
Hemoglobin: 7.8 g/dL — ABNORMAL LOW (ref 13.0–17.0)

## 2020-11-23 LAB — FOLATE: Folate: 7.9 ng/mL (ref >5.9)

## 2020-11-23 LAB — FERRITIN: Ferritin: 44 ng/mL (ref 24–336)

## 2020-11-23 LAB — VITAMIN B12: Vitamin B-12: 414 pg/mL (ref 180–914)

## 2020-11-23 LAB — IRON AND TIBC
Iron: 125 ug/dL (ref 45–182)
Saturation Ratios: 34 % (ref 17.9–39.5)
TIBC: 368 ug/dL (ref 250–450)
UIBC: 243 ug/dL

## 2020-11-23 LAB — GLUCOSE, CAPILLARY
Glucose-Capillary: 140 mg/dL — ABNORMAL HIGH (ref 70–99)
Glucose-Capillary: 147 mg/dL — ABNORMAL HIGH (ref 70–99)

## 2020-11-23 MED ORDER — PANTOPRAZOLE SODIUM 40 MG IV SOLR
40.0000 mg | Freq: Two times a day (BID) | INTRAVENOUS | Status: DC
  Administered 2020-11-23 – 2020-11-25 (×4): 40 mg via INTRAVENOUS
  Filled 2020-11-23 (×4): qty 40

## 2020-11-23 MED ORDER — HYDRALAZINE HCL 20 MG/ML IJ SOLN: 10.0000 mg | INTRAMUSCULAR | Status: DC | PRN

## 2020-11-23 MED ORDER — ONDANSETRON HCL 4 MG PO TABS: 4.0000 mg | ORAL_TABLET | Freq: Four times a day (QID) | ORAL | Status: DC | PRN

## 2020-11-23 MED ORDER — SODIUM CHLORIDE 0.9% FLUSH
10.0000 mL | INTRAVENOUS | Status: DC | PRN
  Filled 2020-11-23: qty 10

## 2020-11-23 MED ORDER — SODIUM CHLORIDE 0.9% IV SOLUTION: Freq: Once | INTRAVENOUS | Status: AC

## 2020-11-23 MED ORDER — INSULIN ASPART 100 UNIT/ML IJ SOLN
0.0000 [IU] | Freq: Three times a day (TID) | INTRAMUSCULAR | Status: DC
  Administered 2020-11-24: 13:00:00 2 [IU] via SUBCUTANEOUS
  Administered 2020-11-25: 09:00:00 3 [IU] via SUBCUTANEOUS
  Filled 2020-11-23 (×2): qty 1

## 2020-11-23 MED ORDER — DIPHENHYDRAMINE HCL 50 MG/ML IJ SOLN
25.0000 mg | Freq: Once | INTRAMUSCULAR | Status: AC
  Administered 2020-11-23: 25 mg via INTRAVENOUS
  Filled 2020-11-23: qty 1

## 2020-11-23 MED ORDER — INSULIN ASPART 100 UNIT/ML IJ SOLN: 0.0000 [IU] | Freq: Every day | INTRAMUSCULAR | Status: DC

## 2020-11-23 MED ORDER — ACETAMINOPHEN 325 MG PO TABS
650.0000 mg | ORAL_TABLET | Freq: Once | ORAL | Status: AC
  Administered 2020-11-23: 650 mg via ORAL
  Filled 2020-11-23: qty 2

## 2020-11-23 MED ORDER — ONDANSETRON HCL 4 MG/2ML IJ SOLN: 4.0000 mg | Freq: Four times a day (QID) | INTRAMUSCULAR | Status: DC | PRN

## 2020-11-23 MED ORDER — ACETAMINOPHEN 650 MG RE SUPP: 650.0000 mg | Freq: Four times a day (QID) | RECTAL | Status: DC | PRN

## 2020-11-23 MED ORDER — SODIUM CHLORIDE 0.9% IV SOLUTION
250.0000 mL | Freq: Once | INTRAVENOUS | Status: AC
  Administered 2020-11-23: 250 mL via INTRAVENOUS
  Filled 2020-11-23: qty 250

## 2020-11-23 MED ORDER — ACETAMINOPHEN 325 MG PO TABS: 650.0000 mg | ORAL_TABLET | Freq: Four times a day (QID) | ORAL | Status: DC | PRN

## 2020-11-23 NOTE — H&P (Signed)
History and Physical    Gregory Dixon RKY:[id number] DOB: 25-Jun-1940 DOA: 11/23/2020  PCP: Valera Castle, MD  Chief Complaint: Dizziness/near syncope  HPI: Gregory Dixon is a 80 y.o. male with a past medical history of iron deficiency anemia, chronic kidney disease stage III, insulin dependent diabetes mellitus type 2, hypertension, hyperlipidemia, hemorrhoids.  The patient presents from the cancer center admitted for acute on chronic anemia.  Hemoglobin was 5.7.  He has received 1 unit of packed red blood cells at the cancer center.  Currently receiving a second unit on the general medical floor.  Hemodynamically stable.  The patient is followed at the cancer center by Dr. Grayland Ormond for anemia associated with chronic renal failure and iron deficiency anemia. He has previously received Retacrit (last dose 05/14/2020) and most recently received 1000 mg of Venofer over 5 doses, which completed on 10/11/2020.  Presented to [facility] today for evaluation for dizziness and generalized weakness as well as near syncope.  This has been occurring for the past week.  Was able to ambulate 30 yards with his cane prior but now can only ambulate 30 feet and he gets short of breath.  He denies any frank bleeding, melena or hematochezia.  States he has hemorrhoids and occasionally when he wipes after a bowel movement notes blood on his toilet paper.  Has off-and-on black stools but is on chronic iron supplementation.  Not on any blood thinners.  Not on any aspirin or Plavix.  Denies any abdominal pain.  Denies any history of peptic ulcer disease.  Unsure if he has a history of diverticulosis.  Denies use of NSAIDs, Goody powder, BC powder.  He denies any fevers or chills.  Denies any abdominal pain.  Denies any nausea vomiting or diarrhea.  Denies any hematemesis.  Patient last saw Dr. Vicente Males with GI in February 2021.  His last EGD/colonoscopy was on 04/29/2019.  Patient was noted to have a 15 mm polyp in the  ascending colon, which was resected via EMR.  Another 20 mm polyp in the proximal ascending colon was also resected by EMR.  There was plan for future capsule study of small bowel as an outpatient but unclear the results of the study.   ED Course: Not applicable.  Review of Systems: 14 point review of systems is negative except for what is mentioned above in the HPI.   Past Medical History:  Diagnosis Date   Actinic keratosis    Basal cell carcinoma 10/21/2017   left lateral neck, excised 02/09/2018   Basal cell carcinoma (BCC) 05/12/2019   right posterior ear, excised 05/31/2019   Blood transfusion without reported diagnosis    Diabetes mellitus without complication [facility])     Past Surgical History:  Procedure Laterality Date   COLONOSCOPY WITH PROPOFOL N/A 04/29/2019   Procedure: COLONOSCOPY WITH PROPOFOL;  Surgeon: Jonathon Bellows, MD;  Location: [facility] ENDOSCOPY;  Service: Gastroenterology;  Laterality: N/A;   ESOPHAGOGASTRODUODENOSCOPY (EGD) WITH PROPOFOL N/A 04/29/2019   Procedure: ESOPHAGOGASTRODUODENOSCOPY (EGD) WITH PROPOFOL;  Surgeon: Jonathon Bellows, MD;  Location: [facility] ENDOSCOPY;  Service: Gastroenterology;  Laterality: N/A;    Social History   Socioeconomic History   Marital status: Married    Spouse name: Not on file   Number of children: Not on file   Years of education: Not on file   Highest education level: Not on file  Occupational History   Not on file  Tobacco Use   Smoking status: Former   Smokeless tobacco: Never  Vaping Use  Vaping Use: Never used  Substance and Sexual Activity   Alcohol use: Not Currently   Drug use: Never   Sexual activity: Not on file  Other Topics Concern   Not on file  Social History Narrative   Not on file   Social Determinants of Health   Financial Resource Strain: Not on file  Food Insecurity: Not on file  Transportation Needs: Not on file  Physical Activity: Not on file  Stress: Not on file  Social Connections: Not on file   Intimate Partner Violence: Not on file    Allergies  Allergen Reactions   Aspirin Other (See Comments)    GI bleed   Codeine Nausea Only and Nausea And Vomiting    History reviewed. No pertinent family history.  Prior to Admission medications   Medication Sig Start Date End Date Taking? Authorizing Provider  carvedilol (COREG) 25 MG tablet Take 25 mg by mouth 2 (two) times daily. 08/03/19   [provider]  fenofibrate 160 MG tablet Take 160 mg by mouth daily. 03/13/19   [provider]  hydrALAZINE (APRESOLINE) 50 MG tablet Take by mouth. 08/03/19   [provider]  isosorbide mononitrate (IMDUR) 30 MG 24 hr tablet Take 30 mg by mouth every evening.  03/14/19   [provider]  Multiple Vitamin (MULTIVITAMIN) capsule Take 1 capsule by mouth daily.    [provider]  NOVOLOG MIX 70/30 FLEXPEN (70-30) 100 UNIT/ML FlexPen Inject 0.1 mLs (10 Units total) into the skin 2 (two) times daily. 04/29/19   Loletha Grayer, MD  olmesartan (BENICAR) 40 MG tablet Take 40 mg by mouth daily.    [provider]  olmesartan-hydrochlorothiazide (BENICAR HCT) 40-25 MG tablet  05/12/19   [provider]  terazosin (HYTRIN) 10 MG capsule Take 10 mg by mouth every evening.  03/13/19   [provider]  vitamin B-12 (CYANOCOBALAMIN) 1000 MCG tablet Take 1 tablet (1,000 mcg total) by mouth daily. 04/29/19   Loletha Grayer, MD    Physical Exam: Vitals:   11/23/20 1546 11/23/20 1622 11/23/20 1624 11/23/20 1644  BP: (!) 141/66 (!) 139/91 (!) 139/91   Pulse: 65 62 62   Resp: 20 18 18    Temp: 98.7 F (37.1 C) 98.5 F (36.9 C) 98.5 F (36.9 C)   TempSrc:   Oral   SpO2: 100%  97%   Weight:    110.2 kg  Height:    6\' 6"  (1.981 m)     General:  Appears calm and comfortable and is in NAD.  Appears pale. Cardiovascular:  RRR, no m/r/g.  Respiratory:   CTA bilaterally with no wheezes/rales/rhonchi.  Normal respiratory effort. Abdomen:   soft, NT, ND, NABS Skin:  no rash or induration seen on limited exam Musculoskeletal:  grossly normal tone BUE/BLE, good ROM, no bony abnormality Lower extremity:  1+ pitting edema bilateral lower extremities.  Limited foot exam with no ulcerations.  2+ distal pulses. Psychiatric:  grossly normal mood and affect, speech fluent and appropriate, AOx3 Neurologic:  CN 2-12 grossly intact, moves all extremities in coordinated fashion, sensation intact    Radiological Exams on Admission: Independently reviewed - see discussion in A/P where applicable  No results found.  Labs on Admission: I have personally reviewed the available labs and imaging studies at the time of the admission.  Pertinent labs: Hemoglobin 5.7, creatinine 4.88, BUN 53, blood glucose 104.   Assessment/Plan: Symptomatic anemia: This patient will be admitted to the medical/surgical floor as a  direct admit from [facility].  Hemoglobin was 5.7 today.  Previous hemoglobin in June was 8.4.  Denies any frank acute blood loss.  States he has hemorrhoids and notes occasional blood with wiping.  Last time this happened was a few days ago. The patient will be transfused 2 units packed red blood cells today.  We will repeat H&H after the second unit.  Iron studies, B12 and folate will be ordered.  We will order a Hemoccult.  Concern for acute blood loss of GI origin.  Gastroenterology will be consulted.  Dr. Vicente Males is already aware.  He is hemodynamically stable.  N.p.o. after midnight.  Start Protonix 40 mg IV twice daily.  Near syncope secondary to above: Plan as above.  Acute on chronic kidney disease: Serum creatinine 4.88 today.  Previous creatinine was 3.1-3.2 last year.  This may be secondary to hypoperfusion due to worsening anemia.  Continue monitoring and if no improvement can consider consulting nephrology.  Repeat renal markers in the morning.  Avoid nephrotoxic medications.  Hypertension: Can continue or hold once medication  reconciliation done by pharmacy.  Hyperlipidemia: Can continue or hold once medication reconciliation done by pharmacy.  Insulin-dependent diabetes mellitus type 2: Can continue or hold once medication reconciliation done by pharmacy. Will start on short-acting sliding scale insulin before meals and at bedtime in the interim.  Iron deficiency anemia: Can continue or hold once medication reconciliation done by pharmacy.  Vitamin B12 deficiency: Can continue or hold once medication reconciliation done by pharmacy.   Level of Care: MedSurg DVT prophylaxis: SCDs, pharmacological VTE contraindicated Code Status: Full code Consults: Gastroenterology, heme-onc Admission status: Inpatient   Leslee Home DO [facility]   How to contact the [facility] Attending or Consulting provider [place] or covering provider during after hours [place], for this patient?  Check the care team in [facility] and look for a) attending/consulting [facility] provider listed and b) the [facility] team listed Log into [email or web address] and use [place]'s universal password to access. If you do not have the password, please contact the hospital operator. Locate the [facility] provider you are looking for under [facility] and page to a number that you can be directly reached. If you still have difficulty reaching the provider, please page the [facility] (Director on Call) for the Hospitalists listed on amion for assistance.   11/23/2020, 5:13 PM

## 2020-11-23 NOTE — Progress Notes (Signed)
Patient to be admitted to 1C room 127. Report already provided to intake RN from clinic RN. Patient received 1 unit PRBC in infusion suite and per Glenard Haring. Is to receive 2nd unit when he is admitted to the floor. Patient transported at 1511 to medical mall to start admission process.

## 2020-11-23 NOTE — Progress Notes (Signed)
Patient coming in for [facility] today. Will add lab encounter and check CBC/CMP.

## 2020-11-23 NOTE — Progress Notes (Signed)
Symptom Management [place]  [phone] [phone]  Patient Care Team: Valera Castle, MD as PCP - General (Family Medicine) Lloyd Huger, MD as Consulting Physician (Oncology)   Name of the patient: Gregory Dixon  [id number]  [birth year]   Date of visit: 11/23/20  Reason for Consult:  [name] is a 80 year old male with multiple medical problems including CKD stage III, iron deficiency anemia, vitamin B12 deficiency, hypertension, hyperlipidemia and diabetes.  Patient last saw Dr. Vicente Males with GI in February 2021.  His last EGD/colonoscopy was on 04/29/2019.  Patient was noted to have a 15 mm polyp in the ascending colon, which was resected via EMR.  Another 20 mm polyp in the proximal ascending colon was also resected by EMR.  There was plan for future capsule study of small bowel as an outpatient but unclear the results of the study.   Patient is followed at the cancer center by Dr. Grayland Ormond for anemia associated with chronic renal failure and iron deficiency anemia.  He has previously received Retacrit (last dose 05/14/2020) and most recently received 1000 mg of Venofer over 5 doses, which completed on 10/11/2020.  Patient last saw Dr. Grayland Ormond on 09/27/2020 at which time he was asymptomatic.  Plan was for patient to complete Venofer.  Patient presents acutely to [facility] [place] today for evaluation of feelings of dizziness and weakness.  Patient reports that over the past week and a half, he has felt progressive "wooziness" with position changes.  He became acutely weak yesterday requiring EMS to evaluate him at home.  Patient refused to go to the ER.  Patient endorses chronic weakness and dyspnea with prolonged exertion but says that this is unchanged from baseline.  He denies shortness of breath or chest pain at present.  He denies any bleeding, melena, hematochezia.  He says he occasionally has bright red blood from hemorrhoids  when he wipes after having a bowel movement.  He also occasionally has dark stools but attributes this to taking iron.  Denies any neurologic complaints. Denies recent fevers or illnesses. Denies any easy bleeding or bruising. Reports good appetite and denies weight loss. Denies chest pain. Denies any nausea, vomiting, constipation, or diarrhea. Denies urinary complaints. Patient offers no further specific complaints today.  PAST MEDICAL HISTORY: Past Medical History:  Diagnosis Date   Actinic keratosis    Basal cell carcinoma 10/21/2017   left lateral neck, excised 02/09/2018   Basal cell carcinoma (BCC) 05/12/2019   right posterior ear, excised 05/31/2019   Blood transfusion without reported diagnosis    Diabetes mellitus without complication ([place])     PAST SURGICAL HISTORY:  Past Surgical History:  Procedure Laterality Date   COLONOSCOPY WITH PROPOFOL N/A 04/29/2019   Procedure: COLONOSCOPY WITH PROPOFOL;  Surgeon: Jonathon Bellows, MD;  Location: [facility] ENDOSCOPY;  Service: Gastroenterology;  Laterality: N/A;   ESOPHAGOGASTRODUODENOSCOPY (EGD) WITH PROPOFOL N/A 04/29/2019   Procedure: ESOPHAGOGASTRODUODENOSCOPY (EGD) WITH PROPOFOL;  Surgeon: Jonathon Bellows, MD;  Location: [facility] ENDOSCOPY;  Service: Gastroenterology;  Laterality: N/A;    HEMATOLOGY/ONCOLOGY HISTORY:  Oncology History   No history exists.    ALLERGIES:  is allergic to aspirin and codeine.  MEDICATIONS:  Current Outpatient Medications  Medication Sig Dispense Refill   carvedilol (COREG) 25 MG tablet Take 25 mg by mouth 2 (two) times daily.     fenofibrate 160 MG tablet Take 160 mg by mouth daily.     hydrALAZINE (APRESOLINE) 50 MG tablet Take by mouth.  isosorbide mononitrate (IMDUR) 30 MG 24 hr tablet Take 30 mg by mouth every evening.      Multiple Vitamin (MULTIVITAMIN) capsule Take 1 capsule by mouth daily.     NOVOLOG MIX 70/30 FLEXPEN (70-30) 100 UNIT/ML FlexPen Inject 0.1 mLs (10 Units total) into the skin 2 (two)  times daily. 15 mL 11   olmesartan (BENICAR) 40 MG tablet Take 40 mg by mouth daily.     olmesartan-hydrochlorothiazide (BENICAR HCT) 40-25 MG tablet      terazosin (HYTRIN) 10 MG capsule Take 10 mg by mouth every evening.      vitamin B-12 (CYANOCOBALAMIN) 1000 MCG tablet Take 1 tablet (1,000 mcg total) by mouth daily. 30 tablet 0   No current facility-administered medications for this visit.    VITAL SIGNS: There were no vitals taken for this visit. There were no vitals filed for this visit.  Estimated body mass index is 29.2 kg/m as calculated from the following:   Height as of 06/28/20: 6\' 6"  (1.981 m).   Weight as of 09/27/20: 252 lb 11.2 oz (114.6 kg).  LABS: CBC:    Component Value Date/Time   WBC 3.2 (L) 09/26/2020 1333   HGB 8.4 (L) 09/26/2020 1333   HGB 12.9 (L) 12/24/2012 1013   HCT 26.4 (L) 09/26/2020 1333   HCT 19.5 (L) 04/27/2019 1739   PLT 125 (L) 09/26/2020 1333   PLT 125 (L) 12/24/2012 1013   MCV 92.0 09/26/2020 1333   MCV 82 12/24/2012 1013   NEUTROABS 2.2 09/26/2020 1333   NEUTROABS 2.5 12/24/2012 1013   LYMPHSABS 0.8 09/26/2020 1333   LYMPHSABS 0.8 (L) 12/24/2012 1013   MONOABS 0.2 09/26/2020 1333   MONOABS 0.2 12/24/2012 1013   EOSABS 0.1 09/26/2020 1333   EOSABS 0.1 12/24/2012 1013   BASOSABS 0.0 09/26/2020 1333   BASOSABS 0.0 12/24/2012 1013   Comprehensive Metabolic Panel:    Component Value Date/Time   NA 138 07/05/2019 1352   NA 139 02/28/2012 0505   K 4.4 07/05/2019 1352   K 4.1 02/28/2012 0505   CL 107 07/05/2019 1352   CL 109 (H) 02/28/2012 0505   CO2 24 07/05/2019 1352   CO2 23 02/28/2012 0505   BUN 40 (H) 07/05/2019 1352   BUN 20 (H) 02/28/2012 0505   CREATININE 3.19 (H) 07/05/2019 1352   CREATININE 1.85 (H) 02/28/2012 0505   GLUCOSE 159 (H) 07/05/2019 1352   GLUCOSE 153 (H) 02/28/2012 0505   CALCIUM 8.6 (L) 07/05/2019 1352   CALCIUM 8.3 (L) 02/28/2012 0505   AST 17 04/27/2019 1149   AST 16 02/27/2012 1645   ALT 13 04/27/2019  1149   ALT 20 02/27/2012 1645   ALKPHOS 19 (L) 04/27/2019 1149   ALKPHOS 22 (L) 02/27/2012 1645   BILITOT 0.5 04/27/2019 1149   BILITOT 0.2 02/27/2012 1645   PROT 5.9 (L) 04/27/2019 1149   PROT 6.0 (L) 02/27/2012 1645   ALBUMIN 3.1 (L) 04/27/2019 1149   ALBUMIN 3.3 (L) 02/27/2012 1645    RADIOGRAPHIC STUDIES: No results found.  PERFORMANCE STATUS (ECOG) : 2 - Symptomatic, <50% confined to bed  Review of Systems Unless otherwise noted, a complete review of systems is negative.  Physical Exam General: NAD Cardiovascular: regular rate and rhythm Pulmonary: clear anterior/posterior fields Abdomen: soft, nontender, + bowel sounds GU: no suprapubic tenderness Extremities: no edema, no joint deformities Skin: no rashes Neurological: Weakness but otherwise nonfocal  Assessment and Plan- Patient is a 80 y.o. male with multiple medical problems including  CKD stage III, iron deficiency anemia, vitamin B12 deficiency, hypertension, hyperlipidemia and diabetes, who presents to [facility] today for evaluation of weakness and dizziness   Symptomatic anemia -hemoglobin 5.7 today, which reflects a fairly precipitous decline since June when his hemoglobin was 8.4.  Given rapid development of symptomatic complaints over the past week, I suspect acute blood loss of GI origin.   Discussed with Dr. Grayland Ormond and will proceed with transfusing 2 units PRBCs today.  We will add on iron panel, B12, and RBC folate to labs.  Discussed with Dr. Vicente Males with GI who agreed to see patient.   Acute on chronic kidney disease-serum creatinine 4.88 today (baseline creatinine 3-3.19 last year).  Patient says that he has been previously followed by nephrology - Dr. Holley Raring, but cannot recall when he was last seen by nephrology.  Suspect that acute on chronic kidney disease is secondary to hypoperfusion due to worsening anemia.  Again, we will proceed with transfusing 2 units packed red blood cells today. We will also pursue direct  admission for further evaluation and trending of labs.  Case and plan discussed with Dr. Grayland Ormond, Dr. Billie Ruddy, and Dr. Vicente Males   Patient expressed understanding and was in agreement with this plan. He also understands that He can call clinic at any time with any questions, concerns, or complaints.   Thank you for allowing me to participate in the care of this very pleasant patient.   Time Total: 45 minutes  Visit consisted of counseling and education dealing with the complex and emotionally intense issues of symptom management and palliative care in the setting of serious and potentially life-threatening illness.Greater than 50%  of this time was spent counseling and coordinating care related to the above assessment and plan.  Signed by: Altha Harm, PhD, NP-C

## 2020-11-24 DIAGNOSIS — N179 Acute kidney failure, unspecified: Secondary | ICD-10-CM

## 2020-11-24 DIAGNOSIS — E1122 Type 2 diabetes mellitus with diabetic chronic kidney disease: Secondary | ICD-10-CM

## 2020-11-24 DIAGNOSIS — D649 Anemia, unspecified: Secondary | ICD-10-CM | POA: Diagnosis not present

## 2020-11-24 DIAGNOSIS — D500 Iron deficiency anemia secondary to blood loss (chronic): Principal | ICD-10-CM

## 2020-11-24 DIAGNOSIS — N189 Chronic kidney disease, unspecified: Secondary | ICD-10-CM

## 2020-11-24 DIAGNOSIS — I10 Essential (primary) hypertension: Secondary | ICD-10-CM

## 2020-11-24 DIAGNOSIS — N184 Chronic kidney disease, stage 4 (severe): Secondary | ICD-10-CM

## 2020-11-24 LAB — CBC
HCT: 21.9 % — ABNORMAL LOW (ref 39.0–52.0)
Hemoglobin: 7.4 g/dL — ABNORMAL LOW (ref 13.0–17.0)
MCH: 30.7 pg (ref 26.0–34.0)
MCHC: 33.8 g/dL (ref 30.0–36.0)
MCV: 90.9 fL (ref 80.0–100.0)
Platelets: 137 10*3/uL — ABNORMAL LOW (ref 150–400)
RBC: 2.41 MIL/uL — ABNORMAL LOW (ref 4.22–5.81)
RDW: 15.9 % — ABNORMAL HIGH (ref 11.5–15.5)
WBC: 4.4 10*3/uL (ref 4.0–10.5)
nRBC: 0 % (ref 0.0–0.2)

## 2020-11-24 LAB — GLUCOSE, CAPILLARY
Glucose-Capillary: 104 mg/dL — ABNORMAL HIGH (ref 70–99)
Glucose-Capillary: 141 mg/dL — ABNORMAL HIGH (ref 70–99)
Glucose-Capillary: 108 mg/dL — ABNORMAL HIGH (ref 70–99)
Glucose-Capillary: 191 mg/dL — ABNORMAL HIGH (ref 70–99)

## 2020-11-24 LAB — COMPREHENSIVE METABOLIC PANEL
ALT: 10 U/L (ref 0–44)
AST: 15 U/L (ref 15–41)
Albumin: 2.7 g/dL — ABNORMAL LOW (ref 3.5–5.0)
Alkaline Phosphatase: 15 U/L — ABNORMAL LOW (ref 38–126)
Anion gap: 6 (ref 5–15)
BUN: 56 mg/dL — ABNORMAL HIGH (ref 8–23)
CO2: 23 mmol/L (ref 22–32)
Calcium: 8.4 mg/dL — ABNORMAL LOW (ref 8.9–10.3)
Chloride: 111 mmol/L (ref 98–111)
Creatinine, Ser: 4.87 mg/dL — ABNORMAL HIGH (ref 0.61–1.24)
GFR, Estimated: 11 mL/min — ABNORMAL LOW (ref >60)
Glucose, Bld: 91 mg/dL (ref 70–99)
Potassium: 4.2 mmol/L (ref 3.5–5.1)
Sodium: 140 mmol/L (ref 135–145)
Total Bilirubin: 0.8 mg/dL (ref 0.3–1.2)
Total Protein: 5.1 g/dL — ABNORMAL LOW (ref 6.5–8.1)

## 2020-11-24 LAB — PREPARE RBC (CROSSMATCH)

## 2020-11-24 LAB — VITAMIN B12: Vitamin B-12: 385 pg/mL (ref 180–914)

## 2020-11-24 LAB — HEMOGLOBIN AND HEMATOCRIT, BLOOD
HCT: 25.1 % — ABNORMAL LOW (ref 39.0–52.0)
Hemoglobin: 8.1 g/dL — ABNORMAL LOW (ref 13.0–17.0)

## 2020-11-24 LAB — TRANSFERRIN: Transferrin: 263 mg/dL (ref 180–329)

## 2020-11-24 MED ORDER — IRBESARTAN 150 MG PO TABS: 300.0000 mg | ORAL_TABLET | Freq: Every day | ORAL | Status: DC

## 2020-11-24 MED ORDER — ISOSORBIDE MONONITRATE ER 30 MG PO TB24
30.0000 mg | ORAL_TABLET | Freq: Every evening | ORAL | Status: DC
  Administered 2020-11-24: 18:00:00 30 mg via ORAL
  Filled 2020-11-24: qty 1

## 2020-11-24 MED ORDER — ADULT MULTIVITAMIN W/MINERALS CH
1.0000 | ORAL_TABLET | Freq: Every day | ORAL | Status: DC
  Administered 2020-11-24 – 2020-11-25 (×2): 1 via ORAL
  Filled 2020-11-24 (×2): qty 1

## 2020-11-24 MED ORDER — TERAZOSIN HCL 5 MG PO CAPS
10.0000 mg | ORAL_CAPSULE | Freq: Every evening | ORAL | Status: DC
  Administered 2020-11-24: 18:00:00 10 mg via ORAL
  Filled 2020-11-24 (×2): qty 2

## 2020-11-24 MED ORDER — HYDRALAZINE HCL 50 MG PO TABS
25.0000 mg | ORAL_TABLET | Freq: Three times a day (TID) | ORAL | Status: DC
  Administered 2020-11-24 – 2020-11-25 (×3): 25 mg via ORAL
  Filled 2020-11-24 (×3): qty 1

## 2020-11-24 MED ORDER — SODIUM CHLORIDE 0.9% IV SOLUTION: Freq: Once | INTRAVENOUS | Status: AC

## 2020-11-24 MED ORDER — SODIUM CHLORIDE 0.9 % IV SOLN
400.0000 mg | Freq: Once | INTRAVENOUS | Status: AC
  Administered 2020-11-24: 21:00:00 400 mg via INTRAVENOUS
  Filled 2020-11-24: qty 20

## 2020-11-24 MED ORDER — VITAMIN B-12 1000 MCG PO TABS
1000.0000 ug | ORAL_TABLET | Freq: Every day | ORAL | Status: DC
  Administered 2020-11-24 – 2020-11-25 (×2): 1000 ug via ORAL
  Filled 2020-11-24 (×2): qty 1

## 2020-11-24 NOTE — TOC Progression Note (Signed)
Transition of Care [facility]) - Progression Note    Patient Details  Name: Gregory Dixon MRN: [id number] Date of Birth: May 02, 1940  Transition of Care [facility]) CM/SW [place], RN Phone Number: 11/24/2020, 4:15 PM  Clinical Narrative:   Patient lives at home with wife and daughter lives within 10 mins of patient's home.  Patient uses cane and scooter to ambulate at home.    Gregory Dixon currently does not use home health, has no concerns about transportation to appointments, is able to get his medication from the pharmacy and states he is able to take his medications as prescribed.  He currently has no concerns for TOC.  RNCM provided TOC contact information if needed.         Expected Discharge Plan and Services                                                 Social Determinants of Health (SDOH) Interventions    Readmission Risk Interventions No flowsheet data found.

## 2020-11-24 NOTE — Consult Note (Signed)
Gregory Dixon , MD [address], [place], [place], [place], [address] [address] [place], [place], [place], [address] Phone: [phone]  Fax: [phone]  Consultation  Referring Provider:     Dr Leslye Peer Primary Care Physician:  Valera Castle, MD Primary Gastroenterologist:  Dr. Vicente Males         Reason for Consultation:     Anemia   Date of Admission:  11/23/2020 Date of Consultation:  11/24/2020         HPI:   Gregory Dixon is a 80 y.o. male.  Patient who had seen me back in February 2021 for iron deficiency and B12 deficiency anemia.  He underwent an EGD in January 2021 that was normal and a colonoscopy found 2 large polyps in the proximal descending colon that were resected via EMR.  There were tubular adenomas.  He received iron supplementation and B12 supplementation with hematology.  Hemoglobin improved.  The plan was to perform the capsule study of small bowel.  It appears that the procedure was canceled by the patient and not scheduled.  Yesterday he was seen by rheumatology and of having dizziness and weakness.,  Dyspnea on exertion.  Hemoglobin was 5.7 g.  Iron studies and B12 were ordered.  Given 2 units PRBCs.   Creatinine bumped up to 3.19-4.88.  B12 normal, ferritin 44 presently appears normal.  This morning hemoglobin is 7.4 g after transfusion.  MCV was normal 1 month back.  Platelet count is low at 137.  Denies any overt blood loss, no nsaid use, no nasal bleeds, hemoptysis, hematemesis or rectal bleeding . Denies any abdominal  pain. Dark stool that he attributes to oral iron tablets.  Past Medical History:  Diagnosis Date   Actinic keratosis    Basal cell carcinoma 10/21/2017   left lateral neck, excised 02/09/2018   Basal cell carcinoma (BCC) 05/12/2019   right posterior ear, excised 05/31/2019   Blood transfusion without reported diagnosis    Diabetes mellitus without complication [facility])     Past Surgical History:  Procedure Laterality Date    COLONOSCOPY WITH PROPOFOL N/A 04/29/2019   Procedure: COLONOSCOPY WITH PROPOFOL;  Surgeon: Gregory Bellows, MD;  Location: [facility] ENDOSCOPY;  Service: Gastroenterology;  Laterality: N/A;   ESOPHAGOGASTRODUODENOSCOPY (EGD) WITH PROPOFOL N/A 04/29/2019   Procedure: ESOPHAGOGASTRODUODENOSCOPY (EGD) WITH PROPOFOL;  Surgeon: Gregory Bellows, MD;  Location: [facility] ENDOSCOPY;  Service: Gastroenterology;  Laterality: N/A;    Prior to Admission medications   Medication Sig Start Date End Date Taking? Authorizing Provider  carvedilol (COREG) 25 MG tablet Take 25 mg by mouth 2 (two) times daily. 08/03/19   [provider]  fenofibrate 160 MG tablet Take 160 mg by mouth daily. 03/13/19   [provider]  hydrALAZINE (APRESOLINE) 50 MG tablet Take by mouth. 08/03/19   [provider]  isosorbide mononitrate (IMDUR) 30 MG 24 hr tablet Take 30 mg by mouth every evening.  03/14/19   [provider]  Multiple Vitamin (MULTIVITAMIN) capsule Take 1 capsule by mouth daily.    [provider]  NOVOLOG MIX 70/30 FLEXPEN (70-30) 100 UNIT/ML FlexPen Inject 0.1 mLs (10 Units total) into the skin 2 (two) times daily. 04/29/19   Loletha Grayer, MD  olmesartan (BENICAR) 40 MG tablet Take 40 mg by mouth daily.    [provider]  olmesartan-hydrochlorothiazide (BENICAR HCT) 40-25 MG tablet  05/12/19   [provider]  terazosin (HYTRIN) 10 MG capsule Take 10 mg by mouth every evening.  03/13/19   [provider]  vitamin B-12 (CYANOCOBALAMIN) 1000 MCG tablet Take 1 tablet (1,000 mcg total) by mouth daily. 04/29/19   Loletha Grayer, MD    History reviewed. No pertinent family history.   Social History   Tobacco Use   Smoking status: Former   Smokeless tobacco: Never  Vaping Use   Vaping Use: Never used  Substance Use Topics   Alcohol use: Not Currently   Drug use: Never    Allergies as of 11/23/2020 - Review Complete 11/23/2020  Allergen Reaction Noted    Aspirin Other (See Comments) 10/20/2016   Codeine Nausea Only and Nausea And Vomiting 02/20/2011    Review of Systems:    All systems reviewed and negative except where noted in HPI.   Physical Exam:  Vital signs in last 24 hours: Temp:  [96.3 F (35.7 C)-98.7 F (37.1 C)] 98 F (36.7 C) (08/20 1153) Pulse Rate:  [57-77] 64 (08/20 1153) Resp:  [16-20] 16 (08/20 1153) BP: (117-176)/(50-91) 174/71 (08/20 1153) SpO2:  [97 %-100 %] 100 % (08/20 1153) Weight:  [110.2 kg] 110.2 kg (08/19 1644) Last BM Date: 11/22/20 General:   Pleasant, cooperative in NAD Head:  Normocephalic and atraumatic. Eyes:   No icterus.   Conjunctiva pink. PERRLA. Ears:  Normal auditory acuity. Neck:  Supple; no masses or thyroidomegaly Lungs: Respirations even and unlabored. Lungs clear to auscultation bilaterally.   No wheezes, crackles, or rhonchi.  Heart:  Regular rate and rhythm;  Without murmur, clicks, rubs or gallops Abdomen:  Soft, nondistended, nontender. Normal bowel sounds. No appreciable masses or hepatomegaly.  No rebound or guarding.  Neurologic:  Alert and oriented x3;  grossly normal neurologically. Skin:  Intact without significant lesions or rashes. Cervical Nodes:  No significant cervical adenopathy. Psych:  Alert and cooperative. Normal affect.  LAB RESULTS: Recent Labs    11/23/20 0900 11/23/20 1959 11/24/20 0551  WBC 5.3  --  4.4  HGB 5.7* 7.8* 7.4*  HCT 17.8* 24.0* 21.9*  PLT 151  --  137*   BMET Recent Labs    11/23/20 0900 11/24/20 0551  NA 139 140  K 3.8 4.2  CL 109 111  CO2 23 23  GLUCOSE 104* 91  BUN 53* 56*  CREATININE 4.88* 4.87*  CALCIUM 8.2* 8.4*   LFT Recent Labs    11/24/20 0551  PROT 5.1*  ALBUMIN 2.7*  AST 15  ALT 10  ALKPHOS 15*  BILITOT 0.8   PT/INR No results for input(s): LABPROT, INR in the last 72 hours.  STUDIES: No results found.    Impression / Plan:   [name] is a 80 y.o. y/o male with a history if iron and b12  deficiency anemia. Presently admitted with normocytic anemia and normal iron studies. Prior GI evaluation with EGD and colonoscopy in 2021 showed no abnormalities in the upper endoscopy and large polyps taken out in the colonoscopy.  No source of bleeding was noted.  He did not complete evaluation of the capsule study after he canceled the test.  The lab work that has been performed is not completely consistent with only iron deficiency anemia.  The question is that they have a second process going on such as MDS.  From the GI point of view he would have required for another colonoscopy as his large polyp was resected over the years back and usually will repeated in 6 months especially if taken out piecemeal.  My recommendation would be to repeat an EGD and  colonoscopy and if negative perform a capsule study as an inpatient complete GI work-up. The patient is absolutely not interested and he says he will contact my office if he changes his mind. Patient is aware of the risks of doing so .   I will sign off.  Please call me if any further GI concerns or questions.  We would like to thank you for the opportunity to participate in the care of Herold Harms.   Thank you for involving me in the care of this patient.      LOS: 1 day   Gregory Bellows, MD  11/24/2020, 12:42 PM

## 2020-11-24 NOTE — Progress Notes (Signed)
[facility]  ROUNDING NOTE   Subjective:   Gregory Dixon is a 80 y.o. male with past medical history of diabetes Type 2, hypertension, hemorrhoids, iron deficiency anemia and CKD stage 3. Patient was sent from cancer center due to anemia, Hgb 5.7. Patient hs been admitted for Acute anemia [D64.9]  Patient currently followed by our practice and sees Dr Holley Raring.  Appears to have suffered from anemia for a year or more, based on records. States he used to receive iron infusions and periodic blood transfusions. He received a unit of blood in the cancer center and second unit in the ED. He arrived to ED with Hgb  5.7. Reports fatigue. States he has dark stools due to Iron supplements. Denies bloody vomit. Labs also show Creatinine 4.88, BUN 53, and eGFR 11. Baseline Creatinine 3.19 on 06/08/19.    Objective:  Vital signs in last 24 hours:  Temp:  [96.2 F (35.7 C)-98.7 F (37.1 C)] 98 F (36.7 C) (08/20 1153) Pulse Rate:  [57-77] 64 (08/20 1153) Resp:  [16-20] 16 (08/20 1153) BP: (117-176)/(50-91) 174/71 (08/20 1153) SpO2:  [97 %-100 %] 100 % (08/20 1153) Weight:  [110.2 kg] 110.2 kg (08/19 1644)  Weight change:  Filed Weights   11/23/20 1644  Weight: 110.2 kg    Intake/Output: I/O last 3 completed shifts: In: 6644 [I.V.:300; Blood:756] Out: -    Intake/Output this shift:  No intake/output data recorded.  Physical Exam: General: NAD, laying in bed  Head: Normocephalic, atraumatic. Moist oral mucosal membranes  Eyes: Anicteric  Lungs:  Clear to auscultation, normal effort  Heart: Regular rate and rhythm  Abdomen:  Soft, nontender   Extremities:  no peripheral edema.  Neurologic: Nonfocal, moving all four extremities  Skin: No lesions       Basic Metabolic Panel: Recent Labs  Lab 11/23/20 0900 11/24/20 0551  NA 139 140  K 3.8 4.2  CL 109 111  CO2 23 23  GLUCOSE 104* 91  BUN 53* 56*  CREATININE 4.88* 4.87*  CALCIUM 8.2* 8.4*    Liver Function  Tests: Recent Labs  Lab 11/23/20 0900 11/24/20 0551  AST 16 15  ALT 10 10  ALKPHOS 18* 15*  BILITOT 0.4 0.8  PROT 5.3* 5.1*  ALBUMIN 2.9* 2.7*   No results for input(s): LIPASE, AMYLASE in the last 168 hours. No results for input(s): AMMONIA in the last 168 hours.  CBC: Recent Labs  Lab 11/23/20 0900 11/23/20 1959 11/24/20 0551  WBC 5.3  --  4.4  NEUTROABS 3.6  --   --   HGB 5.7* 7.8* 7.4*  HCT 17.8* 24.0* 21.9*  MCV 93.7  --  90.9  PLT 151  --  137*    Cardiac Enzymes: No results for input(s): CKTOTAL, CKMB, CKMBINDEX, TROPONINI in the last 168 hours.  BNP: Invalid input(s): POCBNP  CBG: Recent Labs  Lab 11/23/20 1542 11/23/20 2343 11/24/20 0756 11/24/20 1203  GLUCAP 147* 140* 104* 141*    Microbiology: Results for orders placed or performed during the hospital encounter of 11/23/20  Resp Panel by RT-PCR (Flu A&B, Covid) Nasopharyngeal Swab     Status: None   Collection Time: 11/23/20  6:24 PM   Specimen: Nasopharyngeal Swab; Nasopharyngeal(NP) swabs in vial transport medium  Result Value Ref Range Status   SARS Coronavirus 2 by RT PCR NEGATIVE NEGATIVE Final    Comment: (NOTE) SARS-CoV-2 target nucleic acids are NOT DETECTED.  The SARS-CoV-2 RNA is generally detectable in upper respiratory specimens during  the acute phase of infection. The lowest concentration of SARS-CoV-2 viral copies this assay can detect is 138 copies/mL. A negative result does not preclude SARS-Cov-2 infection and should not be used as the sole basis for treatment or other patient management decisions. A negative result may occur with  improper specimen collection/handling, submission of specimen other than nasopharyngeal swab, presence of viral mutation(s) within the areas targeted by this assay, and inadequate number of viral copies(<138 copies/mL). A negative result must be combined with clinical observations, patient history, and epidemiological information. The expected  result is Negative.  Fact Sheet for Patients:  EntrepreneurPulse.com.au  Fact Sheet for Healthcare Providers:  IncredibleEmployment.be  This test is no t yet approved or cleared by the [place] FDA and  has been authorized for detection and/or diagnosis of SARS-CoV-2 by FDA under an Emergency Use Authorization (EUA). This EUA will remain  in effect (meaning this test can be used) for the duration of the COVID-19 declaration under Section 564(b)(1) of the Act, 21 U.S.C.section 360bbb-3(b)(1), unless the authorization is terminated  or revoked sooner.       Influenza A by PCR NEGATIVE NEGATIVE Final   Influenza B by PCR NEGATIVE NEGATIVE Final    Comment: (NOTE) The Xpert Xpress SARS-CoV-2/FLU/RSV plus assay is intended as an aid in the diagnosis of influenza from Nasopharyngeal swab specimens and should not be used as a sole basis for treatment. Nasal washings and aspirates are unacceptable for Xpert Xpress SARS-CoV-2/FLU/RSV testing.  Fact Sheet for Patients: EntrepreneurPulse.com.au  Fact Sheet for Healthcare Providers: IncredibleEmployment.be  This test is not yet approved or cleared by the [place] FDA and has been authorized for detection and/or diagnosis of SARS-CoV-2 by FDA under an Emergency Use Authorization (EUA). This EUA will remain in effect (meaning this test can be used) for the duration of the COVID-19 declaration under Section 564(b)(1) of the Act, 21 U.S.C. section 360bbb-3(b)(1), unless the authorization is terminated or revoked.  Performed at [facility], [address] [place], [place] [address]     Coagulation Studies: No results for input(s): LABPROT, INR in the last 72 hours.  Urinalysis: No results for input(s): COLORURINE, LABSPEC, PHURINE, GLUCOSEU, HGBUR, BILIRUBINUR, KETONESUR, PROTEINUR, UROBILINOGEN, NITRITE, LEUKOCYTESUR in the last 72  hours.  Invalid input(s): APPERANCEUR    Imaging: No results found.   Medications:     insulin aspart  0-15 Units Subcutaneous TID WC   insulin aspart  0-5 Units Subcutaneous QHS   irbesartan  300 mg Oral Daily   isosorbide mononitrate  30 mg Oral QPM   multivitamin with minerals  1 tablet Oral Daily   pantoprazole (PROTONIX) IV  40 mg Intravenous Q12H   terazosin  10 mg Oral QPM   vitamin B-12  1,000 mcg Oral Daily   acetaminophen **OR** acetaminophen, hydrALAZINE, ondansetron **OR** ondansetron (ZOFRAN) IV  Assessment/ Plan:  [name] is a 80 y.o.  male with past medical history of diabetes Type 2, hypertension, hemorrhoids, iron deficiency anemia and CKD stage 3. Patient was sent from cancer center due to anemia, Hgb 5.7. Patient hs been admitted for Acute anemia [D64.9]   Acute Kidney Injury on chronic kidney disease stage 3 with baseline creatinine 3.19 on 07/05/19.  Likely due to volume loss with anemia Continue ACE/ARBs for now No IV contrast exposure No indication for dialysis at this time Will monitor labs daily.  Avoid nephrotoxic agents  Lab Results  Component Value Date   CREATININE 4.87 (H) 11/24/2020  CREATININE 4.88 (H) 11/23/2020   CREATININE 3.19 (H) 07/05/2019    Intake/Output Summary (Last 24 hours) at 11/24/2020 1218 Last data filed at 11/24/2020 1046 Gross per 24 hour  Intake 1056 ml  Output --  Net 1056 ml   2. Anemia of chronic kidney disease Lab Results  Component Value Date   HGB 7.4 (L) 11/24/2020   Received 3 units RBC's yesterday Will continue to monitor  3. Diabetes mellitus type II with chronic kidney disease  insulin dependent. Home regimen includes Novolog. Most recent hemoglobin A1c is 5.8 on 04/27/19. Glucose stable  4. Secondary Hyperparathyroidism: with outpatient labs: PTH 18, phosphorus 2.9, calcium 9.5 on 07/22/19.   Lab Results  Component Value Date   CALCIUM 8.4 (L) 11/24/2020    Calcium below target.  Will check phosphorus with am labs      LOS: 1 Gregory Dixon 8/20/202212:18 PM

## 2020-11-24 NOTE — Progress Notes (Signed)
Patient ID: Gregory Dixon, male   DOB: 07-20-1940, 80 y.o.   MRN: [id number] [facility] PROGRESS NOTE  Gregory Dixon GEZ:[id number] DOB: April 10, 1940 DOA: 11/23/2020 PCP: Gregory Castle, MD  HPI/Subjective: Patient seen this morning.  He is feeling better.  Received 2 units of packed red blood cells.  Patient declined GI work-up.  He wanted to eat food.  He states that his stools have been dark secondary to taking iron.  Objective: Vitals:   11/24/20 1132 11/24/20 1153  BP: (!) 174/71 (!) 174/71  Pulse: 64 64  Resp: 16 16  Temp: 98 F (36.7 C) 98 F (36.7 C)  SpO2: 100% 100%    Intake/Output Summary (Last 24 hours) at 11/24/2020 1354 Last data filed at 11/24/2020 1046 Gross per 24 hour  Intake 1056 ml  Output --  Net 1056 ml   Filed Weights   11/23/20 1644  Weight: 110.2 kg    ROS: Review of Systems  Respiratory:  Negative for shortness of breath.   Cardiovascular:  Negative for chest pain.  Gastrointestinal:  Negative for abdominal pain, nausea and vomiting.  Exam: Physical Exam HENT:     Head: Normocephalic.     Mouth/Throat:     Pharynx: No oropharyngeal exudate.  Eyes:     General: Lids are normal.     Conjunctiva/sclera: Conjunctivae normal.  Cardiovascular:     Rate and Rhythm: Normal rate and regular rhythm.     Heart sounds: Normal heart sounds, S1 normal and S2 normal.  Pulmonary:     Breath sounds: Examination of the right-lower field reveals decreased breath sounds. Examination of the left-lower field reveals decreased breath sounds. Decreased breath sounds present. No wheezing, rhonchi or rales.  Abdominal:     Palpations: Abdomen is soft.     Tenderness: There is no abdominal tenderness.  Musculoskeletal:     Right lower leg: Swelling present.     Left lower leg: Swelling present.  Skin:    General: Skin is warm.     Findings: No rash.  Neurological:     Mental Status: He is alert and oriented to person, place, and time.       Scheduled Meds:  insulin aspart  0-15 Units Subcutaneous TID WC   insulin aspart  0-5 Units Subcutaneous QHS   irbesartan  300 mg Oral Daily   isosorbide mononitrate  30 mg Oral QPM   multivitamin with minerals  1 tablet Oral Daily   pantoprazole (PROTONIX) IV  40 mg Intravenous Q12H   terazosin  10 mg Oral QPM   vitamin B-12  1,000 mcg Oral Daily   Continuous Infusions:  Assessment/Plan:  Chronic blood loss anemia.  Iron deficiency anemia.  Patient refused GI work-up.  Received 2 units of packed red blood cells on a hemoglobin of 5.7 on admission and hemoglobin up to 7.4 today.  We will give 1/3 unit of packed red blood cells and IV iron later on today.  Will likely discharge tomorrow. Acute kidney injury on chronic kidney disease stage IV.  Appreciate nephrology consultation.  We will have to follow-up as outpatient.  Check BMP tomorrow morning. Essential hypertension.  Holding Coreg with relative bradycardia.  We do not have olmesartan so we will give irbesartan instead.  Continue Hytrin and hydralazine. Type 2 diabetes mellitus with chronic kidney disease stage IV.  Holding oral medications other than sliding scale for right now.       Code Status:     Code Status Orders  (  From admission, onward)           Start     Ordered   11/23/20 1708  Full code  Continuous        11/23/20 1709           Code Status History     Date Active Date Inactive Code Status Order ID Comments User Context   04/27/2019 1347 04/29/2019 2259 Full Code [id number]  Louellen Molder, MD ED      Family Communication: Spoke with wife on the phone Disposition Plan: Status is: Inpatient  Dispo: The patient is from: Home              Anticipated d/c is to: Home possibly tomorrow since refusing GI work-up              Patient currently receiving a unit of blood today and IV iron today   Difficult to place patient.  No.  Consultants: Gastroenterology Nephrology  Time spent: 28  minutes  [place]

## 2020-11-25 DIAGNOSIS — D696 Thrombocytopenia, unspecified: Secondary | ICD-10-CM

## 2020-11-25 DIAGNOSIS — D472 Monoclonal gammopathy: Secondary | ICD-10-CM

## 2020-11-25 LAB — TYPE AND SCREEN
ABO/RH(D): AB POS
Antibody Screen: NEGATIVE
Unit division: 0
Unit division: 0
Unit division: 0

## 2020-11-25 LAB — PHOSPHORUS: Phosphorus: 4 mg/dL (ref 2.5–4.6)

## 2020-11-25 LAB — BPAM RBC
Blood Product Expiration Date: 202209152359
Blood Product Expiration Date: 202209152359
Blood Product Expiration Date: 202209152359
ISSUE DATE / TIME: 202208191247
ISSUE DATE / TIME: 202208191545
ISSUE DATE / TIME: 202208201110
Unit Type and Rh: 6200
Unit Type and Rh: 6200
Unit Type and Rh: 6200

## 2020-11-25 LAB — FOLATE RBC
Folate, Hemolysate: 297 ng/mL
Folate, RBC: 1747 ng/mL (ref >498)
Hematocrit: 17 % — CL (ref 37.5–51.0)

## 2020-11-25 LAB — CBC
HCT: 24.6 % — ABNORMAL LOW (ref 39.0–52.0)
Hemoglobin: 7.9 g/dL — ABNORMAL LOW (ref 13.0–17.0)
MCH: 28.6 pg (ref 26.0–34.0)
MCHC: 32.1 g/dL (ref 30.0–36.0)
MCV: 89.1 fL (ref 80.0–100.0)
Platelets: 148 10*3/uL — ABNORMAL LOW (ref 150–400)
RBC: 2.76 MIL/uL — ABNORMAL LOW (ref 4.22–5.81)
RDW: 16 % — ABNORMAL HIGH (ref 11.5–15.5)
WBC: 4.3 10*3/uL (ref 4.0–10.5)
nRBC: 0 % (ref 0.0–0.2)

## 2020-11-25 LAB — BASIC METABOLIC PANEL
Anion gap: 5 (ref 5–15)
BUN: 60 mg/dL — ABNORMAL HIGH (ref 8–23)
CO2: 24 mmol/L (ref 22–32)
Calcium: 8.6 mg/dL — ABNORMAL LOW (ref 8.9–10.3)
Chloride: 112 mmol/L — ABNORMAL HIGH (ref 98–111)
Creatinine, Ser: 4.9 mg/dL — ABNORMAL HIGH (ref 0.61–1.24)
GFR, Estimated: 11 mL/min — ABNORMAL LOW (ref >60)
Glucose, Bld: 124 mg/dL — ABNORMAL HIGH (ref 70–99)
Potassium: 4.4 mmol/L (ref 3.5–5.1)
Sodium: 141 mmol/L (ref 135–145)

## 2020-11-25 LAB — GLUCOSE, CAPILLARY: Glucose-Capillary: 192 mg/dL — ABNORMAL HIGH (ref 70–99)

## 2020-11-25 MED ORDER — HYDRALAZINE HCL 50 MG PO TABS: 25.0000 mg | ORAL_TABLET | Freq: Three times a day (TID) | ORAL | Status: DC

## 2020-11-25 MED ORDER — CARVEDILOL 3.125 MG PO TABS: 3.1250 mg | ORAL_TABLET | Freq: Two times a day (BID) | ORAL | 0 refills | Status: DC

## 2020-11-25 MED ORDER — IRBESARTAN 150 MG PO TABS
300.0000 mg | ORAL_TABLET | Freq: Every day | ORAL | Status: DC
  Administered 2020-11-25: 09:00:00 300 mg via ORAL
  Filled 2020-11-25: qty 2

## 2020-11-25 MED ORDER — CARVEDILOL 3.125 MG PO TABS
3.1250 mg | ORAL_TABLET | Freq: Two times a day (BID) | ORAL | Status: DC
  Administered 2020-11-25: 09:00:00 3.125 mg via ORAL
  Filled 2020-11-25: qty 1

## 2020-11-25 MED ORDER — PANTOPRAZOLE SODIUM 40 MG PO TBEC: 40.0000 mg | DELAYED_RELEASE_TABLET | Freq: Every day | ORAL | 0 refills | Status: DC

## 2020-11-25 MED ORDER — HYDRALAZINE HCL 50 MG PO TABS
50.0000 mg | ORAL_TABLET | Freq: Two times a day (BID) | ORAL | 0 refills | Status: DC
Start: 2020-11-25 — End: 2021-01-31

## 2020-11-25 MED ORDER — OLMESARTAN MEDOXOMIL 40 MG PO TABS: 40.0000 mg | ORAL_TABLET | Freq: Every day | ORAL | 0 refills | Status: DC

## 2020-11-25 MED ORDER — HYDRALAZINE HCL 50 MG PO TABS: 50.0000 mg | ORAL_TABLET | Freq: Three times a day (TID) | ORAL | Status: DC

## 2020-11-25 MED ORDER — HYDRALAZINE HCL 50 MG PO TABS
50.0000 mg | ORAL_TABLET | Freq: Two times a day (BID) | ORAL | Status: DC
  Administered 2020-11-25: 09:00:00 50 mg via ORAL
  Filled 2020-11-25: qty 1

## 2020-11-25 NOTE — Discharge Summary (Signed)
[facility] NAME: Gregory Dixon    MR#:  [id number]  DATE OF BIRTH:  07-Jul-1940  DATE OF ADMISSION:  11/23/2020 ADMITTING PHYSICIAN: Leslee Home, DO  DATE OF DISCHARGE: 11/25/2020 10:52 AM  PRIMARY CARE PHYSICIAN: Valera Castle, MD    ADMISSION DIAGNOSIS:  Acute anemia [D64.9]  DISCHARGE DIAGNOSIS:  Active Problems:   Type 2 diabetes mellitus with stage 4 chronic kidney disease, without long-term current use of insulin ([place])   Acute kidney injury superimposed on CKD ([place])   Essential hypertension   Acute anemia   SECONDARY DIAGNOSIS:   Past Medical History:  Diagnosis Date   Actinic keratosis    Basal cell carcinoma 10/21/2017   left lateral neck, excised 02/09/2018   Basal cell carcinoma (BCC) 05/12/2019   right posterior ear, excised 05/31/2019   Blood transfusion without reported diagnosis    Diabetes mellitus without complication [facility])     HOSPITAL COURSE:   Chronic blood loss anemia with iron deficiency anemia.  Patient refused GI work-up here in the hospital.  Dr. Vicente Males saw him in consultation and recommended endoscopy and colonoscopy which patient declined.  Patient came in with a hemoglobin of 5.7 and transfused a total of 3 units of packed red blood cells during the hospital course.  I also gave IV iron.  Hemoglobin upon discharge 7.9.  Likely will have to follow-up weekly with Dr. Grayland Ormond as outpatient for continued blood transfusions and iron infusions.  Likely can also receive Epogen with his chronic kidney disease.  Protonix prescribed. Acute kidney injury on chronic kidney disease stage IV.  Nephrology consultation.  No need for acute dialysis at this point but this may be headed that way in the future.  Needs to follow-up with Dr. Holley Raring nephrology as outpatient. Essential hypertension.  Patient takes hydralazine 50 mg twice a day.  Prescribed olmisartan 40 mg daily.  Discontinue telmisartan HCT.   Continue Hytrin.  I also had to decrease his dose of Coreg down to 3.125 mg twice a day.  Patient also showed me a prescription where he is taking losartan also.  I told him to get rid of the losartan. Type 2 diabetes mellitus with chronic kidney disease stage IV.  I held his insulin secondary to chronic kidney disease.  Continue to watch sugars with diet at this point. MGUS.  Follow-up with Dr. Grayland Ormond as outpatient Chronic thrombocytopenia  DISCHARGE CONDITIONS:   Fair  CONSULTS OBTAINED:  Gastroenterology Dr. Vicente Males Nephrology Dr. Rolly Salter  DRUG ALLERGIES:   Allergies  Allergen Reactions   Aspirin Other (See Comments)    GI bleed   Codeine Nausea Only and Nausea And Vomiting    DISCHARGE MEDICATIONS:   Allergies as of 11/25/2020       Reactions   Aspirin Other (See Comments)   GI bleed   Codeine Nausea Only, Nausea And Vomiting        Medication List     STOP taking these medications    fenofibrate 160 MG tablet   NovoLOG Mix 70/30 FlexPen (70-30) 100 UNIT/ML FlexPen Generic drug: insulin aspart protamine - aspart   olmesartan-hydrochlorothiazide 40-25 MG tablet Commonly known as: BENICAR HCT       TAKE these medications    carvedilol 3.125 MG tablet Commonly known as: COREG Take 1 tablet (3.125 mg total) by mouth 2 (two) times daily with a meal. What changed:  medication strength how much to take when to take this  hydrALAZINE 50 MG tablet Commonly known as: APRESOLINE Take 1 tablet (50 mg total) by mouth 2 (two) times daily. What changed:  how much to take when to take this   isosorbide mononitrate 30 MG 24 hr tablet Commonly known as: IMDUR Take 30 mg by mouth every evening.   multivitamin capsule Take 1 capsule by mouth daily.   olmesartan 40 MG tablet Commonly known as: BENICAR Take 1 tablet (40 mg total) by mouth daily.   pantoprazole 40 MG tablet Commonly known as: Protonix Take 1 tablet (40 mg total) by mouth daily.    terazosin 10 MG capsule Commonly known as: HYTRIN Take 10 mg by mouth every evening.   vitamin B-12 1000 MCG tablet Commonly known as: CYANOCOBALAMIN Take 1 tablet (1,000 mcg total) by mouth daily.         DISCHARGE INSTRUCTIONS:   Follow-up PMD 5 days Follow-up Dr. Grayland Ormond at the cancer center weekly to check blood counts Follow-up Dr. Vicente Males if you consider doing an endoscopy or colonoscopy. Follow-up Dr. Holley Raring nephrology  If you experience worsening of your admission symptoms, develop shortness of breath, life threatening emergency, suicidal or homicidal thoughts you must seek medical attention immediately by calling 911 or calling your MD immediately  if symptoms less severe.  You Must read complete instructions/literature along with all the possible adverse reactions/side effects for all the Medicines you take and that have been prescribed to you. Take any new Medicines after you have completely understood and accept all the possible adverse reactions/side effects.   Please note  You were cared for by a hospitalist during your hospital stay. If you have any questions about your discharge medications or the care you received while you were in the hospital after you are discharged, you can call the unit and asked to speak with the hospitalist on call if the hospitalist that took care of you is not available. Once you are discharged, your primary care physician will handle any further medical issues. Please note that NO REFILLS for any discharge medications will be authorized once you are discharged, as it is imperative that you return to your primary care physician (or establish a relationship with a primary care physician if you do not have one) for your aftercare needs so that they can reassess your need for medications and monitor your lab values.    Today   CHIEF COMPLAINT:  Sent in for hemoglobin of 5.7.  HISTORY OF PRESENT ILLNESS:  Gregory Dixon  is a 80 y.o. male  with a known history of anemia sent in for hemoglobin of 5.7.   VITAL SIGNS:  Blood pressure (!) 142/61, pulse 70, temperature (!) 97.4 F (36.3 C), resp. rate 16, height 6\' 6"  (1.981 m), weight 110.2 kg, SpO2 100 %.  I/O:   Intake/Output Summary (Last 24 hours) at 11/25/2020 1642 Last data filed at 11/24/2020 2357 Gross per 24 hour  Intake 752.59 ml  Output --  Net 752.59 ml    PHYSICAL EXAMINATION:  GENERAL:  80 y.o.-year-old patient lying in the bed with no acute distress.  EYES: Pupils equal, round, reactive to light and accommodation. No scleral icterus.  HEENT: Head atraumatic, normocephalic. Oropharynx and nasopharynx clear.  LUNGS: Normal breath sounds bilaterally, no wheezing, rales,rhonchi or crepitation. No use of accessory muscles of respiration.  CARDIOVASCULAR: S1, S2 normal. No murmurs, rubs, or gallops.  ABDOMEN: Soft, non-tender, non-distended.  EXTREMITIES: Trace pedal edema.  NEUROLOGIC: Cranial nerves II through XII are intact. Muscle strength 5/5  in all extremities. Sensation intact. Gait not checked.  PSYCHIATRIC: The patient is alert and oriented x 3.  SKIN: No obvious rash, lesion, or ulcer.   DATA REVIEW:   CBC Recent Labs  Lab 11/25/20 0453  WBC 4.3  HGB 7.9*  HCT 24.6*  PLT 148*    Chemistries  Recent Labs  Lab 11/24/20 0551 11/25/20 0453  NA 140 141  K 4.2 4.4  CL 111 112*  CO2 23 24  GLUCOSE 91 124*  BUN 56* 60*  CREATININE 4.87* 4.90*  CALCIUM 8.4* 8.6*  AST 15  --   ALT 10  --   ALKPHOS 15*  --   BILITOT 0.8  --      Microbiology Results  Results for orders placed or performed during the hospital encounter of 11/23/20  Resp Panel by RT-PCR (Flu A&B, Covid) Nasopharyngeal Swab     Status: None   Collection Time: 11/23/20  6:24 PM   Specimen: Nasopharyngeal Swab; Nasopharyngeal(NP) swabs in vial transport medium  Result Value Ref Range Status   SARS Coronavirus 2 by RT PCR NEGATIVE NEGATIVE Final    Comment:  (NOTE) SARS-CoV-2 target nucleic acids are NOT DETECTED.  The SARS-CoV-2 RNA is generally detectable in upper respiratory specimens during the acute phase of infection. The lowest concentration of SARS-CoV-2 viral copies this assay can detect is 138 copies/mL. A negative result does not preclude SARS-Cov-2 infection and should not be used as the sole basis for treatment or other patient management decisions. A negative result may occur with  improper specimen collection/handling, submission of specimen other than nasopharyngeal swab, presence of viral mutation(s) within the areas targeted by this assay, and inadequate number of viral copies(<138 copies/mL). A negative result must be combined with clinical observations, patient history, and epidemiological information. The expected result is Negative.  Fact Sheet for Patients:  EntrepreneurPulse.com.au  Fact Sheet for Healthcare Providers:  IncredibleEmployment.be  This test is no t yet approved or cleared by the [place] FDA and  has been authorized for detection and/or diagnosis of SARS-CoV-2 by FDA under an Emergency Use Authorization (EUA). This EUA will remain  in effect (meaning this test can be used) for the duration of the COVID-19 declaration under Section 564(b)(1) of the Act, 21 U.S.C.section 360bbb-3(b)(1), unless the authorization is terminated  or revoked sooner.       Influenza A by PCR NEGATIVE NEGATIVE Final   Influenza B by PCR NEGATIVE NEGATIVE Final    Comment: (NOTE) The Xpert Xpress SARS-CoV-2/FLU/RSV plus assay is intended as an aid in the diagnosis of influenza from Nasopharyngeal swab specimens and should not be used as a sole basis for treatment. Nasal washings and aspirates are unacceptable for Xpert Xpress SARS-CoV-2/FLU/RSV testing.  Fact Sheet for Patients: EntrepreneurPulse.com.au  Fact Sheet for Healthcare  Providers: IncredibleEmployment.be  This test is not yet approved or cleared by the [place] FDA and has been authorized for detection and/or diagnosis of SARS-CoV-2 by FDA under an Emergency Use Authorization (EUA). This EUA will remain in effect (meaning this test can be used) for the duration of the COVID-19 declaration under Section 564(b)(1) of the Act, 21 U.S.C. section 360bbb-3(b)(1), unless the authorization is terminated or revoked.  Performed at [facility], [address], [place], [place] [address]       Management plans discussed with the patient, family and they are in agreement.  CODE STATUS:  Code Status History     Date Active Date Inactive Code Status Order  ID Comments User Context   11/23/2020 1709 11/25/2020 1559 Full Code [id number]  Leslee Home, DO Inpatient   04/27/2019 1347 04/29/2019 2259 Full Code [id number]  Louellen Molder, MD ED      Questions for Most Recent Historical Code Status (Order [id number])        TOTAL TIME TAKING CARE OF THIS PATIENT: 34 minutes.    Loletha Grayer M.D on 11/25/2020 at 4:42 PM  [facility]  CC: Primary care physician; Valera Castle, MD

## 2020-11-25 NOTE — Discharge Instructions (Addendum)
Stop Losartan I prescribed olmesartan alone  (stop taking your combination pill of olmesartan HCT 40/25)  Hold oral iron for two weeks and see if black stools clear up  Need close follow up for blood count in the cancer center as outpatient  Need to follow up with DR Holley Raring for continued follow up for kidney function  Do Not take aspirin, alleve, ibuprofen, motrin, goody powder, bc powder or any anti inflammatories

## 2020-11-25 NOTE — Progress Notes (Signed)
[facility]  ROUNDING NOTE   Subjective:   Gregory Dixon is a 80 y.o.white male with diabetes mellitus type II, hypertension, hemorrhoids, anemia who was sent from cancer center due to anemia, Hgb 5.7. Patient hs been admitted for Acute anemia [D64.9] PRBC transfusion of 3 units during admission. Hemoglobin improved to 7.9 this morning.   Creatinine 4.9. Patient is to be discharged home.    Objective:  Vital signs in last 24 hours:  Temp:  [97.4 F (36.3 C)-98.2 F (36.8 C)] 97.4 F (36.3 C) (08/21 0851) Pulse Rate:  [58-70] 70 (08/21 0851) Resp:  [16-18] 16 (08/21 0851) BP: (142-170)/(61-67) 142/61 (08/21 0851) SpO2:  [99 %-100 %] 100 % (08/21 0851)  Weight change:  Filed Weights   11/23/20 1644  Weight: 110.2 kg    Intake/Output: I/O last 3 completed shifts: In: 1514.6 [P.O.:480; Blood:762; IV Piggyback:272.6] Out: -    Intake/Output this shift:  No intake/output data recorded.  Physical Exam: General: NAD, sitting in char  Head: Normocephalic, atraumatic. Moist oral mucosal membranes  Eyes: Anicteric  Lungs:  Clear to auscultation, normal effort  Heart: Regular rate and rhythm  Abdomen:  Soft, nontender   Extremities:  no peripheral edema.  Neurologic: Nonfocal, moving all four extremities  Skin: No lesions       Basic Metabolic Panel: Recent Labs  Lab 11/23/20 0900 11/24/20 0551 11/25/20 0453  NA 139 140 141  K 3.8 4.2 4.4  CL 109 111 112*  CO2 23 23 24   GLUCOSE 104* 91 124*  BUN 53* 56* 60*  CREATININE 4.88* 4.87* 4.90*  CALCIUM 8.2* 8.4* 8.6*  PHOS  --   --  4.0     Liver Function Tests: Recent Labs  Lab 11/23/20 0900 11/24/20 0551  AST 16 15  ALT 10 10  ALKPHOS 18* 15*  BILITOT 0.4 0.8  PROT 5.3* 5.1*  ALBUMIN 2.9* 2.7*    No results for input(s): LIPASE, AMYLASE in the last 168 hours. No results for input(s): AMMONIA in the last 168 hours.  CBC: Recent Labs  Lab 11/23/20 0900 11/23/20 1003 11/23/20 1959  11/24/20 0551 11/24/20 2203 11/25/20 0453  WBC 5.3  --   --  4.4  --  4.3  NEUTROABS 3.6  --   --   --   --   --   HGB 5.7*  --  7.8* 7.4* 8.1* 7.9*  HCT 17.8* 17.0* 24.0* 21.9* 25.1* 24.6*  MCV 93.7  --   --  90.9  --  89.1  PLT 151  --   --  137*  --  148*     Cardiac Enzymes: No results for input(s): CKTOTAL, CKMB, CKMBINDEX, TROPONINI in the last 168 hours.  BNP: Invalid input(s): POCBNP  CBG: Recent Labs  Lab 11/24/20 0756 11/24/20 1203 11/24/20 1657 11/24/20 2218 11/25/20 0857  GLUCAP 104* 141* 108* 191* 192*     Microbiology: Results for orders placed or performed during the hospital encounter of 11/23/20  Resp Panel by RT-PCR (Flu A&B, Covid) Nasopharyngeal Swab     Status: None   Collection Time: 11/23/20  6:24 PM   Specimen: Nasopharyngeal Swab; Nasopharyngeal(NP) swabs in vial transport medium  Result Value Ref Range Status   SARS Coronavirus 2 by RT PCR NEGATIVE NEGATIVE Final    Comment: (NOTE) SARS-CoV-2 target nucleic acids are NOT DETECTED.  The SARS-CoV-2 RNA is generally detectable in upper respiratory specimens during the acute phase of infection. The lowest concentration of SARS-CoV-2 viral copies  this assay can detect is 138 copies/mL. A negative result does not preclude SARS-Cov-2 infection and should not be used as the sole basis for treatment or other patient management decisions. A negative result may occur with  improper specimen collection/handling, submission of specimen other than nasopharyngeal swab, presence of viral mutation(s) within the areas targeted by this assay, and inadequate number of viral copies(<138 copies/mL). A negative result must be combined with clinical observations, patient history, and epidemiological information. The expected result is Negative.  Fact Sheet for Patients:  EntrepreneurPulse.com.au  Fact Sheet for Healthcare Providers:  IncredibleEmployment.be  This test is  no t yet approved or cleared by the [place] FDA and  has been authorized for detection and/or diagnosis of SARS-CoV-2 by FDA under an Emergency Use Authorization (EUA). This EUA will remain  in effect (meaning this test can be used) for the duration of the COVID-19 declaration under Section 564(b)(1) of the Act, 21 U.S.C.section 360bbb-3(b)(1), unless the authorization is terminated  or revoked sooner.       Influenza A by PCR NEGATIVE NEGATIVE Final   Influenza B by PCR NEGATIVE NEGATIVE Final    Comment: (NOTE) The Xpert Xpress SARS-CoV-2/FLU/RSV plus assay is intended as an aid in the diagnosis of influenza from Nasopharyngeal swab specimens and should not be used as a sole basis for treatment. Nasal washings and aspirates are unacceptable for Xpert Xpress SARS-CoV-2/FLU/RSV testing.  Fact Sheet for Patients: EntrepreneurPulse.com.au  Fact Sheet for Healthcare Providers: IncredibleEmployment.be  This test is not yet approved or cleared by the [place] FDA and has been authorized for detection and/or diagnosis of SARS-CoV-2 by FDA under an Emergency Use Authorization (EUA). This EUA will remain in effect (meaning this test can be used) for the duration of the COVID-19 declaration under Section 564(b)(1) of the Act, 21 U.S.C. section 360bbb-3(b)(1), unless the authorization is terminated or revoked.  Performed at [facility], [address] [place], [place] [address]     Coagulation Studies: No results for input(s): LABPROT, INR in the last 72 hours.  Urinalysis: No results for input(s): COLORURINE, LABSPEC, PHURINE, GLUCOSEU, HGBUR, BILIRUBINUR, KETONESUR, PROTEINUR, UROBILINOGEN, NITRITE, LEUKOCYTESUR in the last 72 hours.  Invalid input(s): APPERANCEUR    Imaging: No results found.   Medications:     carvedilol  3.125 mg Oral BID WC   hydrALAZINE  50 mg Oral BID   insulin aspart  0-15 Units  Subcutaneous TID WC   insulin aspart  0-5 Units Subcutaneous QHS   irbesartan  300 mg Oral Daily   isosorbide mononitrate  30 mg Oral QPM   multivitamin with minerals  1 tablet Oral Daily   pantoprazole (PROTONIX) IV  40 mg Intravenous Q12H   terazosin  10 mg Oral QPM   vitamin B-12  1,000 mcg Oral Daily   acetaminophen **OR** acetaminophen, hydrALAZINE, ondansetron **OR** ondansetron (ZOFRAN) IV  Assessment/ Plan:  Gregory Dixon is a 80 y.o.white male with diabetes mellitus type II, hypertension, hemorrhoids, anemia who was sent from cancer center due to anemia, Hgb 5.7. Patient hs been admitted for Acute anemia [D64.9]  Acute Kidney Injury on chronic kidney disease stage IV with baseline creatinine 3.19, GFR of 18 on 07/05/2019. History of proteinuria. 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 Suspect there is a component of progression of disease.   - Continue irbesartan  - discontinue hydrochlorothiazide on discharge  - needs close nephrology follow up.  Lab Results  Component Value Date   CREATININE 4.90 (H) 11/25/2020  CREATININE 4.87 (H) 11/24/2020   CREATININE 4.88 (H) 11/23/2020    Intake/Output Summary (Last 24 hours) at 11/25/2020 1359 Last data filed at 11/24/2020 2357 Gross per 24 hour  Intake 1138.59 ml  Output --  Net 1138.59 ml    2. Anemia of chronic kidney disease Lab Results  Component Value Date   HGB 7.9 (L) 11/25/2020   Received 3 units PRBC during hospitalization - Appreciate hematology and gastroenterology input.   3. Diabetes mellitus type II with chronic kidney disease  insulin dependent. Home regimen includes Novolog. Most recent hemoglobin A1c is 5.8 on 04/27/19.  4. Hypertension: 142/61. Home regimen of hydrochlorothiazide, olmesartan, carvedilol, terazosin and isosorbide mononitrate.  - Discontinue hydrochlorothiazide.      LOS: 2 Gregory Dixon 8/21/20221:59 PM

## 2020-11-26 ENCOUNTER — Other Ambulatory Visit: Payer: Self-pay

## 2020-11-26 DIAGNOSIS — D519 Vitamin B12 deficiency anemia, unspecified: Secondary | ICD-10-CM

## 2020-11-26 DIAGNOSIS — D500 Iron deficiency anemia secondary to blood loss (chronic): Secondary | ICD-10-CM

## 2020-11-29 ENCOUNTER — Ambulatory Visit: Payer: Medicare Other | Admitting: Gastroenterology

## 2020-11-30 ENCOUNTER — Inpatient Hospital Stay: Payer: Medicare Other

## 2020-11-30 ENCOUNTER — Encounter: Payer: Self-pay | Admitting: Oncology

## 2020-11-30 ENCOUNTER — Other Ambulatory Visit: Payer: Self-pay

## 2020-11-30 ENCOUNTER — Inpatient Hospital Stay (HOSPITAL_BASED_OUTPATIENT_CLINIC_OR_DEPARTMENT_OTHER): Payer: Medicare Other | Admitting: Oncology

## 2020-11-30 VITALS — BP 120/65 | HR 70 | Temp 97.8°F | Resp 16 | Ht 78.0 in | Wt 244.8 lb

## 2020-11-30 DIAGNOSIS — Z79899 Other long term (current) drug therapy: Secondary | ICD-10-CM | POA: Diagnosis not present

## 2020-11-30 DIAGNOSIS — D631 Anemia in chronic kidney disease: Secondary | ICD-10-CM

## 2020-11-30 DIAGNOSIS — N179 Acute kidney failure, unspecified: Secondary | ICD-10-CM

## 2020-11-30 DIAGNOSIS — E611 Iron deficiency: Secondary | ICD-10-CM | POA: Diagnosis not present

## 2020-11-30 DIAGNOSIS — Z886 Allergy status to analgesic agent status: Secondary | ICD-10-CM | POA: Diagnosis not present

## 2020-11-30 DIAGNOSIS — D500 Iron deficiency anemia secondary to blood loss (chronic): Secondary | ICD-10-CM

## 2020-11-30 DIAGNOSIS — N189 Chronic kidney disease, unspecified: Secondary | ICD-10-CM

## 2020-11-30 DIAGNOSIS — Z87891 Personal history of nicotine dependence: Secondary | ICD-10-CM | POA: Diagnosis not present

## 2020-11-30 DIAGNOSIS — Z885 Allergy status to narcotic agent status: Secondary | ICD-10-CM | POA: Diagnosis not present

## 2020-11-30 DIAGNOSIS — D72819 Decreased white blood cell count, unspecified: Secondary | ICD-10-CM | POA: Diagnosis not present

## 2020-11-30 DIAGNOSIS — R42 Dizziness and giddiness: Secondary | ICD-10-CM | POA: Diagnosis not present

## 2020-11-30 DIAGNOSIS — I129 Hypertensive chronic kidney disease with stage 1 through stage 4 chronic kidney disease, or unspecified chronic kidney disease: Secondary | ICD-10-CM | POA: Diagnosis not present

## 2020-11-30 DIAGNOSIS — D519 Vitamin B12 deficiency anemia, unspecified: Secondary | ICD-10-CM

## 2020-11-30 DIAGNOSIS — N400 Enlarged prostate without lower urinary tract symptoms: Secondary | ICD-10-CM | POA: Diagnosis not present

## 2020-11-30 DIAGNOSIS — D472 Monoclonal gammopathy: Secondary | ICD-10-CM | POA: Diagnosis not present

## 2020-11-30 DIAGNOSIS — Z8719 Personal history of other diseases of the digestive system: Secondary | ICD-10-CM | POA: Diagnosis not present

## 2020-11-30 DIAGNOSIS — E1122 Type 2 diabetes mellitus with diabetic chronic kidney disease: Secondary | ICD-10-CM | POA: Diagnosis not present

## 2020-11-30 DIAGNOSIS — E785 Hyperlipidemia, unspecified: Secondary | ICD-10-CM | POA: Diagnosis not present

## 2020-11-30 DIAGNOSIS — R5383 Other fatigue: Secondary | ICD-10-CM | POA: Diagnosis not present

## 2020-11-30 DIAGNOSIS — D696 Thrombocytopenia, unspecified: Secondary | ICD-10-CM | POA: Diagnosis not present

## 2020-11-30 DIAGNOSIS — Z85828 Personal history of other malignant neoplasm of skin: Secondary | ICD-10-CM | POA: Diagnosis not present

## 2020-11-30 LAB — CBC WITH DIFFERENTIAL/PLATELET
Abs Immature Granulocytes: 0.02 10*3/uL (ref 0.00–0.07)
Basophils Absolute: 0 10*3/uL (ref 0.0–0.1)
Basophils Relative: 0 %
Eosinophils Absolute: 0.1 10*3/uL (ref 0.0–0.5)
Eosinophils Relative: 2 %
HCT: 23.4 % — ABNORMAL LOW (ref 39.0–52.0)
Hemoglobin: 7.3 g/dL — ABNORMAL LOW (ref 13.0–17.0)
Immature Granulocytes: 1 %
Lymphocytes Relative: 19 %
Lymphs Abs: 0.7 10*3/uL (ref 0.7–4.0)
MCH: 28.9 pg (ref 26.0–34.0)
MCHC: 31.2 g/dL (ref 30.0–36.0)
MCV: 92.5 fL (ref 80.0–100.0)
Monocytes Absolute: 0.2 10*3/uL (ref 0.1–1.0)
Monocytes Relative: 7 %
Neutro Abs: 2.5 10*3/uL (ref 1.7–7.7)
Neutrophils Relative %: 71 %
Platelets: 130 10*3/uL — ABNORMAL LOW (ref 150–400)
RBC: 2.53 MIL/uL — ABNORMAL LOW (ref 4.22–5.81)
RDW: 16.4 % — ABNORMAL HIGH (ref 11.5–15.5)
WBC: 3.5 10*3/uL — ABNORMAL LOW (ref 4.0–10.5)
nRBC: 0 % (ref 0.0–0.2)

## 2020-11-30 LAB — COMPREHENSIVE METABOLIC PANEL
ALT: 14 U/L (ref 0–44)
AST: 20 U/L (ref 15–41)
Albumin: 2.8 g/dL — ABNORMAL LOW (ref 3.5–5.0)
Alkaline Phosphatase: 20 U/L — ABNORMAL LOW (ref 38–126)
Anion gap: 6 (ref 5–15)
BUN: 61 mg/dL — ABNORMAL HIGH (ref 8–23)
CO2: 20 mmol/L — ABNORMAL LOW (ref 22–32)
Calcium: 8 mg/dL — ABNORMAL LOW (ref 8.9–10.3)
Chloride: 113 mmol/L — ABNORMAL HIGH (ref 98–111)
Creatinine, Ser: 4.69 mg/dL — ABNORMAL HIGH (ref 0.61–1.24)
GFR, Estimated: 12 mL/min — ABNORMAL LOW (ref >60)
Glucose, Bld: 117 mg/dL — ABNORMAL HIGH (ref 70–99)
Potassium: 4.1 mmol/L (ref 3.5–5.1)
Sodium: 139 mmol/L (ref 135–145)
Total Bilirubin: 0.3 mg/dL (ref 0.3–1.2)
Total Protein: 5.3 g/dL — ABNORMAL LOW (ref 6.5–8.1)

## 2020-11-30 LAB — PREPARE RBC (CROSSMATCH)

## 2020-11-30 MED ORDER — DIPHENHYDRAMINE HCL 25 MG PO CAPS
25.0000 mg | ORAL_CAPSULE | Freq: Once | ORAL | Status: AC
  Administered 2020-11-30: 25 mg via ORAL
  Filled 2020-11-30: qty 1

## 2020-11-30 MED ORDER — ACETAMINOPHEN 325 MG PO TABS
650.0000 mg | ORAL_TABLET | Freq: Once | ORAL | Status: AC
  Administered 2020-11-30: 650 mg via ORAL
  Filled 2020-11-30: qty 2

## 2020-11-30 MED ORDER — SODIUM CHLORIDE 0.9% IV SOLUTION
250.0000 mL | Freq: Once | INTRAVENOUS | Status: AC
  Administered 2020-11-30: 250 mL via INTRAVENOUS
  Filled 2020-11-30: qty 250

## 2020-11-30 NOTE — Patient Instructions (Signed)
Blood Transfusion, Adult, Care After This sheet gives you information about how to care for yourself after your procedure. Your doctor may also give you more specific instructions. If youhave problems or questions, contact your doctor. What can I expect after the procedure? After the procedure, it is common to have: Bruising and soreness at the IV site. A fever or chills on the day of the procedure. This may be your body's response to the new blood cells received. A headache. Follow these instructions at home: Insertion site care     Follow instructions from your doctor about how to take care of your insertion site. This is where an IV tube was put into your vein. Make sure you: Wash your hands with soap and water before and after you change your bandage (dressing). If you cannot use soap and water, use hand sanitizer. Change your bandage as told by your doctor. Check your insertion site every day for signs of infection. Check for: Redness, swelling, or pain. Bleeding from the site. Warmth. Pus or a bad smell. General instructions Take over-the-counter and prescription medicines only as told by your doctor. Rest as told by your doctor. Go back to your normal activities as told by your doctor. Keep all follow-up visits as told by your doctor. This is important. Contact a doctor if: You have itching or red, swollen areas of skin (hives). You feel worried or nervous (anxious). You feel weak after doing your normal activities. You have redness, swelling, warmth, or pain around the insertion site. You have blood coming from the insertion site, and the blood does not stop with pressure. You have pus or a bad smell coming from the insertion site. Get help right away if: You have signs of a serious reaction. This may be coming from an allergy or the body's defense system (immune system). Signs include: Trouble breathing or shortness of breath. Swelling of the face or feeling warm  (flushed). Fever or chills. Head, chest, or back pain. Dark pee (urine) or blood in the pee. Widespread rash. Fast heartbeat. Feeling dizzy or light-headed. You may receive your blood transfusion in an outpatient setting. If so, youwill be told whom to contact to report any reactions. These symptoms may be an emergency. Do not wait to see if the symptoms will go away. Get medical help right away. Call your local emergency services (911 in the [place] Do not drive yourself to the hospital. Summary Bruising and soreness at the IV site are common. Check your insertion site every day for signs of infection. Rest as told by your doctor. Go back to your normal activities as told by your doctor. Get help right away if you have signs of a serious reaction. This information is not intended to replace advice given to you by your health care provider. Make sure you discuss any questions you have with your healthcare provider. Document Revised: 09/16/2018 Document Reviewed: 09/16/2018 Elsevier Patient Education  2022 Elsevier Inc.  

## 2020-11-30 NOTE — Progress Notes (Signed)
No concerns. 

## 2020-11-30 NOTE — Progress Notes (Signed)
[facility]  Telephone:([phone]) [phone] [phone]) [phone]  ID: Gregory Dixon OB: 1940/09/19  MR#: [id number]  VCB#:[id number]  Patient Care Team: Valera Castle, MD as PCP - General (Family Medicine) Lloyd Huger, MD as Consulting Physician (Oncology)  CHIEF COMPLAINT: Anemia  HPI- Gregory Dixon is a 80 year old male with past medical history significant for hypertension, arthrosclerosis, type 2 diabetes, BPH, CKD, hyperlipidemia who is followed by Dr. Grayland Ormond for iron deficiency anemia.  He has received intermittent IV Venofer last given on 11/24/2020.  He was hospitalized from 11/23/2020 - 11/25/2020 for acute anemia hemoglobin 5.7.  He was given 3 units of PRBC.  GI recommended endoscopy/colonoscopy but patient declined.  He presents to clinic today for follow-up, labs and possible IV iron or blood.  He does have a history of CKD and nephrology is following.  INTERVAL HISTORY:  Today, he reports doing well. He denies any blood in his stool although when he wipes there is bright red blood visible on the toilet paper.  Denies dark tarry stools.  Denies any abdominal or rectal pain.  Still feels unsteady when he is walking but feels better than when he presented to the emergency room.  Reports occasional dizziness with standing.  He has fallen on 2 separate occasions but did not injure himself.  States he has been through this for greater than 15 years and has had several colonoscopies identifying hemorrhoids which have been banded in the past.  He is not interested in an additional colonoscopy.  States typically the bleeding just stops.  REVIEW OF SYSTEMS:   Review of Systems  Constitutional:  Positive for malaise/fatigue. Negative for chills, fever and weight loss.  HENT:  Negative for congestion, ear pain and tinnitus.   Eyes: Negative.  Negative for blurred vision and double vision.  Respiratory: Negative.  Negative for cough, sputum production and shortness of  breath.   Cardiovascular: Negative.  Negative for chest pain, palpitations and leg swelling.  Gastrointestinal: Negative.  Negative for abdominal pain, constipation, diarrhea, nausea and vomiting.  Genitourinary:  Negative for dysuria, frequency and urgency.  Musculoskeletal:  Negative for back pain and falls.  Skin: Negative.  Negative for rash.  Neurological:  Positive for dizziness and weakness. Negative for headaches.  Endo/Heme/Allergies: Negative.  Does not bruise/bleed easily.  Psychiatric/Behavioral: Negative.  Negative for depression. The patient is not nervous/anxious and does not have insomnia.    As per HPI. Otherwise, a complete review of systems is negative.  PAST MEDICAL HISTORY: Past Medical History:  Diagnosis Date   Actinic keratosis    Basal cell carcinoma 10/21/2017   left lateral neck, excised 02/09/2018   Basal cell carcinoma (BCC) 05/12/2019   right posterior ear, excised 05/31/2019   Blood transfusion without reported diagnosis    Diabetes mellitus without complication ([place])     PAST SURGICAL HISTORY: Past Surgical History:  Procedure Laterality Date   COLONOSCOPY WITH PROPOFOL N/A 04/29/2019   Procedure: COLONOSCOPY WITH PROPOFOL;  Surgeon: Jonathon Bellows, MD;  Location: [facility] ENDOSCOPY;  Service: Gastroenterology;  Laterality: N/A;   ESOPHAGOGASTRODUODENOSCOPY (EGD) WITH PROPOFOL N/A 04/29/2019   Procedure: ESOPHAGOGASTRODUODENOSCOPY (EGD) WITH PROPOFOL;  Surgeon: Jonathon Bellows, MD;  Location[facility] ENDOSCOPY;  Service: Gastroenterology;  Laterality: N/A;    FAMILY HISTORY: History reviewed. No pertinent family history.  ADVANCED DIRECTIVES (Y/N):  N  HEALTH MAINTENANCE: Social History   Tobacco Use   Smoking status: Former   Smokeless tobacco: Never  Vaping Use   Vaping Use: Never used  Substance Use Topics   Alcohol use: Not Currently    Comment: rare once a year   Drug use: Never     Colonoscopy:  PAP:  Bone density:  Lipid panel:  Allergies   Allergen Reactions   Aspirin Other (See Comments)    GI bleed   Codeine Nausea Only and Nausea And Vomiting    Current Outpatient Medications  Medication Sig Dispense Refill   carvedilol (COREG) 3.125 MG tablet Take 1 tablet (3.125 mg total) by mouth 2 (two) times daily with a meal. 60 tablet 0   hydrALAZINE (APRESOLINE) 50 MG tablet Take 1 tablet (50 mg total) by mouth 2 (two) times daily. 60 tablet 0   isosorbide mononitrate (IMDUR) 30 MG 24 hr tablet Take 30 mg by mouth every evening.      Multiple Vitamin (MULTIVITAMIN) capsule Take 1 capsule by mouth daily.     olmesartan (BENICAR) 40 MG tablet Take 1 tablet (40 mg total) by mouth daily. 30 tablet 0   pantoprazole (PROTONIX) 40 MG tablet Take 1 tablet (40 mg total) by mouth daily. 30 tablet 0   terazosin (HYTRIN) 10 MG capsule Take 10 mg by mouth every evening.      vitamin B-12 (CYANOCOBALAMIN) 1000 MCG tablet Take 1 tablet (1,000 mcg total) by mouth daily. 30 tablet 0   No current facility-administered medications for this visit.    OBJECTIVE: Vitals:   11/30/20 0909  BP: 120/65  Pulse: 70  Resp: 16  Temp: 97.8 F (36.6 C)     Body mass index is 28.29 kg/m.    ECOG FS:0 - Asymptomatic  Physical Exam Constitutional:      Appearance: Normal appearance.  HENT:     Head: Normocephalic and atraumatic.  Eyes:     Pupils: Pupils are equal, round, and reactive to light.  Cardiovascular:     Rate and Rhythm: Normal rate and regular rhythm.     Heart sounds: Normal heart sounds. No murmur heard. Pulmonary:     Effort: Pulmonary effort is normal.     Breath sounds: Normal breath sounds. No wheezing.  Abdominal:     General: Bowel sounds are normal. There is no distension.     Palpations: Abdomen is soft.     Tenderness: There is no abdominal tenderness.  Musculoskeletal:        General: Normal range of motion.     Cervical back: Normal range of motion.  Skin:    General: Skin is warm and dry.     Coloration: Skin is  pale.     Findings: No rash.  Neurological:     Mental Status: He is alert and oriented to person, place, and time.  Psychiatric:        Judgment: Judgment normal.     LAB RESULTS:  Lab Results  Component Value Date   NA 139 11/30/2020   K 4.1 11/30/2020   CL 113 (H) 11/30/2020   CO2 20 (L) 11/30/2020   GLUCOSE 117 (H) 11/30/2020   BUN 61 (H) 11/30/2020   CREATININE 4.69 (H) 11/30/2020   CALCIUM 8.0 (L) 11/30/2020   PROT 5.3 (L) 11/30/2020   ALBUMIN 2.8 (L) 11/30/2020   AST 20 11/30/2020   ALT 14 11/30/2020   ALKPHOS 20 (L) 11/30/2020   BILITOT 0.3 11/30/2020   GFRNONAA 12 (L) 11/30/2020   GFRAA 20 (L) 07/05/2019    Lab Results  Component Value Date   WBC 3.5 (L) 11/30/2020   NEUTROABS 2.5 11/30/2020  HGB 7.3 (L) 11/30/2020   HCT 23.4 (L) 11/30/2020   MCV 92.5 11/30/2020   PLT 130 (L) 11/30/2020   Lab Results  Component Value Date   IRON 125 11/23/2020   TIBC 368 11/23/2020   IRONPCTSAT 34 11/23/2020   Lab Results  Component Value Date   FERRITIN 44 11/23/2020     STUDIES: No results found.   ASSESSMENT: Anemia associated with chronic renal failure, iron deficiency anemia.  PLAN:    1. Acute GI bleed-hx of iron deficiency and CKD:  Receives intermittent IV iron last given on 11/24/20 for several years now. States this has been a longstanding problem for 15-20 years. Reports problems with hemorrhoids in the past status post banding.  Previous colonoscopy/egd were unrevealing but did show 2 polyps that were removed.  Hemoglobin at discharge from hospital was 7.9.  He received 3 units of packed red blood cells while hospitalized.  Labs from today show hemoglobin of 7.3.  Proceed with 1 unit PRBC and return to clinic in 1 week for labs possible blood/iron.  He is not interested in a repeat colonoscopy/egd at this time.  2.  Chronic renal insufficiency:  He was seen by Dr. Juleen China while hospitalized.  Previously has received 40,000 units Retacrit with no  significant improvement of his hemoglobin.  This was discontinued.  Most recent creatinine is 4.69 (4.90).   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  4.  Leukopenia:  WBC 3.5. Continue to monitor.   5.  Thrombocytopenia:  Chronic and unchanged. Plt count is 130.   I spent 25 minutes dedicated to the care of this patient (face-to-face and non-face-to-face) on the date of the encounter to include what is described in the assessment and plan.  Patient expressed understanding and was in agreement with this plan. He also understands that He can call clinic at any time with any questions, concerns, or complaints.    Jacquelin Hawking, NP   11/30/2020 11:07 AM

## 2020-12-01 LAB — BPAM RBC
Blood Product Expiration Date: 202209022359
ISSUE DATE / TIME: 202208261253
Unit Type and Rh: 7300

## 2020-12-01 LAB — TYPE AND SCREEN
ABO/RH(D): AB POS
Antibody Screen: NEGATIVE
Unit division: 0

## 2020-12-07 ENCOUNTER — Inpatient Hospital Stay: Payer: Medicare Other

## 2020-12-07 ENCOUNTER — Inpatient Hospital Stay: Payer: Medicare Other | Attending: Oncology

## 2020-12-07 ENCOUNTER — Other Ambulatory Visit: Payer: Self-pay

## 2020-12-07 VITALS — BP 144/54 | HR 59 | Temp 96.7°F | Resp 16

## 2020-12-07 DIAGNOSIS — Z79899 Other long term (current) drug therapy: Secondary | ICD-10-CM | POA: Diagnosis not present

## 2020-12-07 DIAGNOSIS — N184 Chronic kidney disease, stage 4 (severe): Secondary | ICD-10-CM | POA: Diagnosis present

## 2020-12-07 DIAGNOSIS — D631 Anemia in chronic kidney disease: Secondary | ICD-10-CM | POA: Insufficient documentation

## 2020-12-07 DIAGNOSIS — D500 Iron deficiency anemia secondary to blood loss (chronic): Secondary | ICD-10-CM

## 2020-12-07 DIAGNOSIS — D472 Monoclonal gammopathy: Secondary | ICD-10-CM | POA: Diagnosis not present

## 2020-12-07 DIAGNOSIS — E611 Iron deficiency: Secondary | ICD-10-CM | POA: Insufficient documentation

## 2020-12-07 LAB — CBC
HCT: 25.7 % — ABNORMAL LOW (ref 39.0–52.0)
Hemoglobin: 8.2 g/dL — ABNORMAL LOW (ref 13.0–17.0)
MCH: 28.8 pg (ref 26.0–34.0)
MCHC: 31.9 g/dL (ref 30.0–36.0)
MCV: 90.2 fL (ref 80.0–100.0)
Platelets: 148 10*3/uL — ABNORMAL LOW (ref 150–400)
RBC: 2.85 MIL/uL — ABNORMAL LOW (ref 4.22–5.81)
RDW: 15.5 % (ref 11.5–15.5)
WBC: 4.2 10*3/uL (ref 4.0–10.5)
nRBC: 0 % (ref 0.0–0.2)

## 2020-12-07 LAB — IRON AND TIBC
Iron: 45 ug/dL (ref 45–182)
Saturation Ratios: 16 % — ABNORMAL LOW (ref 17.9–39.5)
TIBC: 280 ug/dL (ref 250–450)
UIBC: 235 ug/dL

## 2020-12-07 LAB — FERRITIN: Ferritin: 97 ng/mL (ref 24–336)

## 2020-12-07 LAB — SAMPLE TO BLOOD BANK

## 2020-12-07 MED ORDER — SODIUM CHLORIDE 0.9 % IV SOLN
Freq: Once | INTRAVENOUS | Status: AC
  Filled 2020-12-07: qty 250

## 2020-12-07 MED ORDER — IRON SUCROSE 20 MG/ML IV SOLN
200.0000 mg | Freq: Once | INTRAVENOUS | Status: AC
  Administered 2020-12-07: 200 mg via INTRAVENOUS
  Filled 2020-12-07: qty 10

## 2020-12-07 NOTE — Progress Notes (Signed)
[place]  [phone] [phone]  ID: Gregory Dixon OB: September 14, 1940  MR#: [id number]  WLS#:[id number]  Patient Care Team: Valera Castle, MD as PCP - General (Family Medicine) Lloyd Huger, MD as Consulting Physician (Oncology)  CHIEF COMPLAINT: Anemia associated with chronic renal failure, iron deficiency anemia.  INTERVAL HISTORY: Patient returns to clinic today for further evaluation and consideration of additional IV iron.  Continues to feel well and remains asymptomatic.  He does not complain of any weakness or fatigue today.  He has no neurologic complaints.  He denies any recent fevers or illnesses.  He has a good appetite and denies weight loss.  He has no chest pain, shortness of breath, cough, or hemoptysis.  He denies any nausea, vomiting, constipation, or diarrhea.  He denies any melena or hematochezia.  He has no urinary complaints.  Patient offers no specific complaints today.  REVIEW OF SYSTEMS:   Review of Systems  Constitutional: Negative.  Negative for fever, malaise/fatigue and weight loss.  Respiratory: Negative.  Negative for cough and hemoptysis.   Cardiovascular: Negative.  Negative for chest pain and leg swelling.  Gastrointestinal: Negative.  Negative for abdominal pain, blood in stool and melena.  Genitourinary: Negative.  Negative for hematuria.  Musculoskeletal: Negative.  Negative for back pain.  Skin: Negative.  Negative for rash.  Neurological: Negative.  Negative for dizziness, focal weakness, weakness and headaches.  Psychiatric/Behavioral: Negative.  The patient is not nervous/anxious.    As per HPI. Otherwise, a complete review of systems is negative.  PAST MEDICAL HISTORY: Past Medical History:  Diagnosis Date   Actinic keratosis    Basal cell carcinoma 10/21/2017   left lateral neck, excised 02/09/2018   Basal cell carcinoma (BCC) 05/12/2019   right posterior ear, excised 05/31/2019   Blood  transfusion without reported diagnosis    Diabetes mellitus without complication ([place])     PAST SURGICAL HISTORY: Past Surgical History:  Procedure Laterality Date   COLONOSCOPY WITH PROPOFOL N/A 04/29/2019   Procedure: COLONOSCOPY WITH PROPOFOL;  Surgeon: Jonathon Bellows, MD;  Location[facility] ENDOSCOPY;  Service: Gastroenterology;  Laterality: N/A;   ESOPHAGOGASTRODUODENOSCOPY (EGD) WITH PROPOFOL N/A 04/29/2019   Procedure: ESOPHAGOGASTRODUODENOSCOPY (EGD) WITH PROPOFOL;  Surgeon: Jonathon Bellows, MD;  Location: [facility] ENDOSCOPY;  Service: Gastroenterology;  Laterality: N/A;    FAMILY HISTORY: History reviewed. No pertinent family history.  ADVANCED DIRECTIVES (Y/N):  N  HEALTH MAINTENANCE: Social History   Tobacco Use   Smoking status: Former   Smokeless tobacco: Never  Scientific laboratory technician Use: Never used  Substance Use Topics   Alcohol use: Not Currently    Comment: rare once a year   Drug use: Never     Colonoscopy:  PAP:  Bone density:  Lipid panel:  Allergies  Allergen Reactions   Aspirin Other (See Comments)    GI bleed   Codeine Nausea Only and Nausea And Vomiting    Current Outpatient Medications  Medication Sig Dispense Refill   carvedilol (COREG) 3.125 MG tablet Take 1 tablet (3.125 mg total) by mouth 2 (two) times daily with a meal. 60 tablet 0   hydrALAZINE (APRESOLINE) 50 MG tablet Take 1 tablet (50 mg total) by mouth 2 (two) times daily. 60 tablet 0   isosorbide mononitrate (IMDUR) 30 MG 24 hr tablet Take 30 mg by mouth every evening.      Multiple Vitamin (MULTIVITAMIN) capsule Take 1 capsule by mouth daily.     olmesartan (BENICAR) 40 MG  tablet Take 1 tablet (40 mg total) by mouth daily. 30 tablet 0   pantoprazole (PROTONIX) 40 MG tablet Take 1 tablet (40 mg total) by mouth daily. 30 tablet 0   terazosin (HYTRIN) 10 MG capsule Take 10 mg by mouth every evening.      vitamin B-12 (CYANOCOBALAMIN) 1000 MCG tablet Take 1 tablet (1,000 mcg total) by mouth daily.  30 tablet 0   No current facility-administered medications for this visit.    OBJECTIVE: Vitals:   12/13/20 0859  BP: (!) 149/67  Pulse: 76  Resp: 18  Temp: 98.4 F (36.9 C)     Body mass index is 29.42 kg/m.    ECOG FS:0 - Asymptomatic  General: Well-developed, well-nourished, no acute distress. Eyes: Pink conjunctiva, anicteric sclera. HEENT: Normocephalic, moist mucous membranes. Lungs: No audible wheezing or coughing. Heart: Regular rate and rhythm. Abdomen: Soft, nontender, no obvious distention. Musculoskeletal: No edema, cyanosis, or clubbing. Neuro: Alert, answering all questions appropriately. Cranial nerves grossly intact. Skin: No rashes or petechiae noted. Psych: Normal affect.   LAB RESULTS:  Lab Results  Component Value Date   NA 139 11/30/2020   K 4.1 11/30/2020   CL 113 (H) 11/30/2020   CO2 20 (L) 11/30/2020   GLUCOSE 117 (H) 11/30/2020   BUN 61 (H) 11/30/2020   CREATININE 4.69 (H) 11/30/2020   CALCIUM 8.0 (L) 11/30/2020   PROT 5.3 (L) 11/30/2020   ALBUMIN 2.8 (L) 11/30/2020   AST 20 11/30/2020   ALT 14 11/30/2020   ALKPHOS 20 (L) 11/30/2020   BILITOT 0.3 11/30/2020   GFRNONAA 12 (L) 11/30/2020   GFRAA 20 (L) 07/05/2019    Lab Results  Component Value Date   WBC 4.2 12/13/2020   NEUTROABS 3.0 12/13/2020   HGB 7.9 (L) 12/13/2020   HCT 24.3 (L) 12/13/2020   MCV 90.0 12/13/2020   PLT 154 12/13/2020   Lab Results  Component Value Date   IRON 35 (L) 12/13/2020   TIBC 241 (L) 12/13/2020   IRONPCTSAT 15 (L) 12/13/2020   Lab Results  Component Value Date   FERRITIN 140 12/13/2020     STUDIES: No results found.   ASSESSMENT: Anemia associated with chronic renal failure, iron deficiency anemia.  PLAN:    1.  Anemia associated with chronic renal failure, iron deficiency anemia: EGD, virtual endoscopy, and colonoscopy in January 2021 were unrevealing, but 2 polyps were removed.  Patient's hemoglobin has improved to 7.9.  His iron  stores remain decreased.  Proceed with 200 mg IV Venofer today.  Patient will then return to clinic twice next week for additional treatment.  Previously, patient did not feel Retacrit offers much benefit.  Patient will then return to clinic in 2 months with repeat laboratory work, further evaluation, and continuation of treatment if needed. 2.  Chronic renal insufficiency: Chronic and unchanged.  Patient's most recent creatinine is now greater than 4.0.  Continue follow-up with nephrology. 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 4.  Leukopenia: Resolved. 5.  Thrombocytopenia: Resolved.  Patient expressed understanding and was in agreement with this plan. He also understands that He can call clinic at any time with any questions, concerns, or complaints.    Lloyd Huger, MD   12/15/2020 6:44 AM

## 2020-12-07 NOTE — Progress Notes (Signed)
HGB 8.2. Per Dr. Grayland Ormond, no PRBC today, patient is to receive Venofer instead.

## 2020-12-13 ENCOUNTER — Encounter: Payer: Self-pay | Admitting: Oncology

## 2020-12-13 ENCOUNTER — Inpatient Hospital Stay: Payer: Medicare Other

## 2020-12-13 ENCOUNTER — Inpatient Hospital Stay (HOSPITAL_BASED_OUTPATIENT_CLINIC_OR_DEPARTMENT_OTHER): Payer: Medicare Other | Admitting: Oncology

## 2020-12-13 VITALS — BP 149/67 | HR 76 | Temp 98.4°F | Resp 18 | Wt 254.6 lb

## 2020-12-13 VITALS — BP 157/61 | HR 61 | Resp 18

## 2020-12-13 DIAGNOSIS — N189 Chronic kidney disease, unspecified: Secondary | ICD-10-CM | POA: Diagnosis not present

## 2020-12-13 DIAGNOSIS — D500 Iron deficiency anemia secondary to blood loss (chronic): Secondary | ICD-10-CM

## 2020-12-13 DIAGNOSIS — N184 Chronic kidney disease, stage 4 (severe): Secondary | ICD-10-CM | POA: Diagnosis not present

## 2020-12-13 DIAGNOSIS — D631 Anemia in chronic kidney disease: Secondary | ICD-10-CM

## 2020-12-13 LAB — CBC WITH DIFFERENTIAL/PLATELET
Abs Immature Granulocytes: 0.08 10*3/uL — ABNORMAL HIGH (ref 0.00–0.07)
Basophils Absolute: 0 10*3/uL (ref 0.0–0.1)
Basophils Relative: 1 %
Eosinophils Absolute: 0.1 10*3/uL (ref 0.0–0.5)
Eosinophils Relative: 3 %
HCT: 24.3 % — ABNORMAL LOW (ref 39.0–52.0)
Hemoglobin: 7.9 g/dL — ABNORMAL LOW (ref 13.0–17.0)
Immature Granulocytes: 2 %
Lymphocytes Relative: 16 %
Lymphs Abs: 0.7 10*3/uL (ref 0.7–4.0)
MCH: 29.3 pg (ref 26.0–34.0)
MCHC: 32.5 g/dL (ref 30.0–36.0)
MCV: 90 fL (ref 80.0–100.0)
Monocytes Absolute: 0.3 10*3/uL (ref 0.1–1.0)
Monocytes Relative: 7 %
Neutro Abs: 3 10*3/uL (ref 1.7–7.7)
Neutrophils Relative %: 71 %
Platelets: 154 10*3/uL (ref 150–400)
RBC: 2.7 MIL/uL — ABNORMAL LOW (ref 4.22–5.81)
RDW: 15.8 % — ABNORMAL HIGH (ref 11.5–15.5)
WBC: 4.2 10*3/uL (ref 4.0–10.5)
nRBC: 0 % (ref 0.0–0.2)

## 2020-12-13 LAB — IRON AND TIBC
Iron: 35 ug/dL — ABNORMAL LOW (ref 45–182)
Saturation Ratios: 15 % — ABNORMAL LOW (ref 17.9–39.5)
TIBC: 241 ug/dL — ABNORMAL LOW (ref 250–450)
UIBC: 206 ug/dL

## 2020-12-13 LAB — SAMPLE TO BLOOD BANK

## 2020-12-13 LAB — FERRITIN: Ferritin: 140 ng/mL (ref 24–336)

## 2020-12-13 MED ORDER — IRON SUCROSE 20 MG/ML IV SOLN
200.0000 mg | Freq: Once | INTRAVENOUS | Status: AC
  Administered 2020-12-13: 200 mg via INTRAVENOUS
  Filled 2020-12-13: qty 10

## 2020-12-13 MED ORDER — SODIUM CHLORIDE 0.9 % IV SOLN
Freq: Once | INTRAVENOUS | Status: AC
  Filled 2020-12-13: qty 250

## 2020-12-13 NOTE — Patient Instructions (Signed)
[facility] [place] [facility]   Discharge Instructions: Thank you for choosing [facility] to provide your oncology and hematology care.  If you have a lab appointment with the Cancer Center, please go directly to the Cancer Center and check in at the registration area.  We strive to give you quality time with your provider. You may need to reschedule your appointment if you arrive late (15 or more minutes).  Arriving late affects you and other patients whose appointments are after yours.  Also, if you miss three or more appointments without notifying the office, you may be dismissed from the clinic at the provider's discretion.      For prescription refill requests, have your pharmacy contact our office and allow 72 hours for refills to be completed.    Today you received the following: Venofer.      BELOW ARE SYMPTOMS THAT SHOULD BE REPORTED IMMEDIATELY: *FEVER GREATER THAN 100.4 F (38 C) OR HIGHER *CHILLS OR SWEATING *NAUSEA AND VOMITING THAT IS NOT CONTROLLED WITH YOUR NAUSEA MEDICATION *UNUSUAL SHORTNESS OF BREATH *UNUSUAL BRUISING OR BLEEDING *URINARY PROBLEMS (pain or burning when urinating, or frequent urination) *BOWEL PROBLEMS (unusual diarrhea, constipation, pain near the anus) TENDERNESS IN MOUTH AND THROAT WITH OR WITHOUT PRESENCE OF ULCERS (sore throat, sores in mouth, or a toothache) UNUSUAL RASH, SWELLING OR PAIN  UNUSUAL VAGINAL DISCHARGE OR ITCHING   Items with * indicate a potential emergency and should be followed up as soon as possible or go to the Emergency Department if any problems should occur.  Should you have questions after your visit or need to cancel or reschedule your appointment, please contact [facility]  [phone] and follow the prompts.  Office hours are 8:00 a.m. to 4:30 p.m. Monday - Friday. Please note that voicemails left after 4:00 p.m. may not be returned until the following  business day.  We are closed weekends and major holidays. You have access to a nurse at all times for urgent questions. Please call the main number to the clinic [phone] and follow the prompts.  For any non-urgent questions, you may also contact your provider using MyChart. We now offer e-Visits for anyone 18 and older to request care online for non-urgent symptoms. For details visit [email or web address].   Also download the MyChart app! Go to the app store, search "MyChart", open the app, select [place], and log in with your MyChart username and password.  Due to Covid, a mask is required upon entering the hospital/clinic. If you do not have a mask, one will be given to you upon arrival. For doctor visits, patients may have 1 support person aged 18 or older with them. For treatment visits, patients cannot have anyone with them due to current Covid guidelines and our immunocompromised population.  

## 2020-12-13 NOTE — Progress Notes (Signed)
Patient has a 10 lb wt gain since last week and he does not feel like he has edema.

## 2020-12-17 ENCOUNTER — Inpatient Hospital Stay: Payer: Medicare Other

## 2020-12-17 VITALS — BP 160/74 | HR 63 | Temp 97.0°F | Resp 18

## 2020-12-17 DIAGNOSIS — N184 Chronic kidney disease, stage 4 (severe): Secondary | ICD-10-CM | POA: Diagnosis not present

## 2020-12-17 DIAGNOSIS — D500 Iron deficiency anemia secondary to blood loss (chronic): Secondary | ICD-10-CM

## 2020-12-17 MED ORDER — IRON SUCROSE 20 MG/ML IV SOLN
200.0000 mg | Freq: Once | INTRAVENOUS | Status: AC
  Administered 2020-12-17: 200 mg via INTRAVENOUS
  Filled 2020-12-17: qty 10

## 2020-12-17 MED ORDER — SODIUM CHLORIDE 0.9 % IV SOLN
INTRAVENOUS | Status: DC
  Filled 2020-12-17: qty 250

## 2020-12-17 NOTE — Patient Instructions (Signed)
[facility] [place] [facility]   Discharge Instructions: Thank you for choosing [facility] to provide your oncology and hematology care.  If you have a lab appointment with the Cancer Center, please go directly to the Cancer Center and check in at the registration area.  We strive to give you quality time with your provider. You may need to reschedule your appointment if you arrive late (15 or more minutes).  Arriving late affects you and other patients whose appointments are after yours.  Also, if you miss three or more appointments without notifying the office, you may be dismissed from the clinic at the provider's discretion.      For prescription refill requests, have your pharmacy contact our office and allow 72 hours for refills to be completed.    Today you received the following: Venofer.      BELOW ARE SYMPTOMS THAT SHOULD BE REPORTED IMMEDIATELY: *FEVER GREATER THAN 100.4 F (38 C) OR HIGHER *CHILLS OR SWEATING *NAUSEA AND VOMITING THAT IS NOT CONTROLLED WITH YOUR NAUSEA MEDICATION *UNUSUAL SHORTNESS OF BREATH *UNUSUAL BRUISING OR BLEEDING *URINARY PROBLEMS (pain or burning when urinating, or frequent urination) *BOWEL PROBLEMS (unusual diarrhea, constipation, pain near the anus) TENDERNESS IN MOUTH AND THROAT WITH OR WITHOUT PRESENCE OF ULCERS (sore throat, sores in mouth, or a toothache) UNUSUAL RASH, SWELLING OR PAIN  UNUSUAL VAGINAL DISCHARGE OR ITCHING   Items with * indicate a potential emergency and should be followed up as soon as possible or go to the Emergency Department if any problems should occur.  Should you have questions after your visit or need to cancel or reschedule your appointment, please contact [facility] [place] [facility]  [phone] and follow the prompts.  Office hours are 8:00 a.m. to 4:30 p.m. Monday - Friday. Please note that voicemails left after 4:00 p.m. may not be returned until the following  business day.  We are closed weekends and major holidays. You have access to a nurse at all times for urgent questions. Please call the main number to the clinic [phone] and follow the prompts.  For any non-urgent questions, you may also contact your provider using MyChart. We now offer e-Visits for anyone 18 and older to request care online for non-urgent symptoms. For details visit [email or web address].   Also download the MyChart app! Go to the app store, search "MyChart", open the app, select [place], and log in with your MyChart username and password.  Due to Covid, a mask is required upon entering the hospital/clinic. If you do not have a mask, one will be given to you upon arrival. For doctor visits, patients may have 1 support person aged 18 or older with them. For treatment visits, patients cannot have anyone with them due to current Covid guidelines and our immunocompromised population.  

## 2020-12-20 ENCOUNTER — Inpatient Hospital Stay: Payer: Medicare Other

## 2020-12-20 VITALS — BP 183/62 | HR 58 | Temp 97.0°F | Resp 21

## 2020-12-20 DIAGNOSIS — N184 Chronic kidney disease, stage 4 (severe): Secondary | ICD-10-CM | POA: Diagnosis not present

## 2020-12-20 DIAGNOSIS — D500 Iron deficiency anemia secondary to blood loss (chronic): Secondary | ICD-10-CM

## 2020-12-20 MED ORDER — IRON SUCROSE 20 MG/ML IV SOLN
200.0000 mg | Freq: Once | INTRAVENOUS | Status: AC
  Administered 2020-12-20: 200 mg via INTRAVENOUS
  Filled 2020-12-20: qty 10

## 2020-12-20 MED ORDER — SODIUM CHLORIDE 0.9 % IV SOLN
Freq: Once | INTRAVENOUS | Status: AC
  Filled 2020-12-20: qty 250

## 2020-12-20 NOTE — Progress Notes (Signed)
Vitals reviewed with MD prior to Venofer infusion. Pt is asymptomatic and has no complaints at this time. Per MD to continue with Venofer infusion today, pt updated. Pt states he "feels fine" after iron infusion. Pt stable for discharge.   Hindy Perrault [employer]

## 2020-12-20 NOTE — Patient Instructions (Signed)

## 2020-12-28 ENCOUNTER — Inpatient Hospital Stay: Payer: Medicare Other

## 2020-12-28 ENCOUNTER — Inpatient Hospital Stay (HOSPITAL_BASED_OUTPATIENT_CLINIC_OR_DEPARTMENT_OTHER): Payer: Medicare Other | Admitting: Oncology

## 2020-12-28 VITALS — BP 151/72 | HR 61 | Temp 97.9°F | Wt 246.1 lb

## 2020-12-28 DIAGNOSIS — N189 Chronic kidney disease, unspecified: Secondary | ICD-10-CM

## 2020-12-28 DIAGNOSIS — D539 Nutritional anemia, unspecified: Secondary | ICD-10-CM

## 2020-12-28 DIAGNOSIS — D500 Iron deficiency anemia secondary to blood loss (chronic): Secondary | ICD-10-CM

## 2020-12-28 DIAGNOSIS — D631 Anemia in chronic kidney disease: Secondary | ICD-10-CM

## 2020-12-28 DIAGNOSIS — D472 Monoclonal gammopathy: Secondary | ICD-10-CM | POA: Diagnosis not present

## 2020-12-28 DIAGNOSIS — N184 Chronic kidney disease, stage 4 (severe): Secondary | ICD-10-CM | POA: Diagnosis not present

## 2020-12-28 LAB — CBC WITH DIFFERENTIAL/PLATELET
Abs Immature Granulocytes: 0.03 10*3/uL (ref 0.00–0.07)
Basophils Absolute: 0 10*3/uL (ref 0.0–0.1)
Basophils Relative: 1 %
Eosinophils Absolute: 0.1 10*3/uL (ref 0.0–0.5)
Eosinophils Relative: 2 %
HCT: 26.7 % — ABNORMAL LOW (ref 39.0–52.0)
Hemoglobin: 8.4 g/dL — ABNORMAL LOW (ref 13.0–17.0)
Immature Granulocytes: 1 %
Lymphocytes Relative: 14 %
Lymphs Abs: 0.6 10*3/uL — ABNORMAL LOW (ref 0.7–4.0)
MCH: 28.8 pg (ref 26.0–34.0)
MCHC: 31.5 g/dL (ref 30.0–36.0)
MCV: 91.4 fL (ref 80.0–100.0)
Monocytes Absolute: 0.2 10*3/uL (ref 0.1–1.0)
Monocytes Relative: 6 %
Neutro Abs: 3.3 10*3/uL (ref 1.7–7.7)
Neutrophils Relative %: 76 %
Platelets: 168 10*3/uL (ref 150–400)
RBC: 2.92 MIL/uL — ABNORMAL LOW (ref 4.22–5.81)
RDW: 15.5 % (ref 11.5–15.5)
WBC: 4.3 10*3/uL (ref 4.0–10.5)
nRBC: 0 % (ref 0.0–0.2)

## 2020-12-28 LAB — IRON AND TIBC
Iron: 48 ug/dL (ref 45–182)
Saturation Ratios: 18 % (ref 17.9–39.5)
TIBC: 266 ug/dL (ref 250–450)
UIBC: 218 ug/dL

## 2020-12-28 LAB — FERRITIN: Ferritin: 169 ng/mL (ref 24–336)

## 2020-12-28 MED ORDER — EPOETIN ALFA-EPBX 40000 UNIT/ML IJ SOLN
40000.0000 [IU] | Freq: Once | INTRAMUSCULAR | Status: AC
  Administered 2020-12-28: 40000 [IU] via SUBCUTANEOUS
  Filled 2020-12-28: qty 1

## 2020-12-28 NOTE — Progress Notes (Signed)
[place]  Telephone:(336) [phone] [phone]) [phone]  ID: Herold Harms OB: 1940-08-23  MR#: [id number]  TOI#:[id number]  Patient Care Team: Valera Castle, MD as PCP - General (Family Medicine) Lloyd Huger, MD as Consulting Physician (Oncology)  CHIEF COMPLAINT: Anemia associated with chronic renal failure, iron deficiency anemia.  INTERVAL HISTORY: [name] is a 80 year old male with past medical history significant for hypertension, arthrosclerosis, history of GI bleed, type 2 diabetes, neuropathy, BPH, CKD, thrombocytopenia, obesity, MGUS who is followed by Dr. Grayland Ormond for anemia secondary to iron deficiency and CKD.  He receives intermittent IV Venofer last given between 12/07/20-12/20/20.   He was hospitalized from 11/23/2020-11/25/2020 for acute anemia with a hemoglobin of 5.7.  He was given 3 units of PRBC.  GI recommended endoscopy/colonoscopy but patient declined.  He presents to clinic today for follow-up and possible Retacrit.  He reports feeling improved after receiving iron.  Since discharge from the hospital, reports a decrease in his appetite and has a nervous stomach.  He feels out of breath with exertion.  He self stopped Protonix because he did not feel like this was helping.  He has had several episodes of diarrhea since hospitalization and iron infusion.  REVIEW OF SYSTEMS:   Review of Systems  Constitutional:  Positive for malaise/fatigue and weight loss (Change in appetite). Negative for chills and fever.  HENT:  Negative for congestion, ear pain and tinnitus.   Eyes: Negative.  Negative for blurred vision and double vision.  Respiratory: Negative.  Negative for cough, sputum production and shortness of breath.   Cardiovascular: Negative.  Negative for chest pain, palpitations and leg swelling.  Gastrointestinal:  Positive for diarrhea. Negative for abdominal pain, constipation, nausea and vomiting.  Genitourinary:  Negative for  dysuria, frequency and urgency.  Musculoskeletal:  Negative for back pain and falls.  Skin: Negative.  Negative for rash.  Neurological: Negative.  Negative for weakness and headaches.  Endo/Heme/Allergies: Negative.  Does not bruise/bleed easily.  Psychiatric/Behavioral:  Negative for depression. The patient is nervous/anxious. The patient does not have insomnia.    As per HPI. Otherwise, a complete review of systems is negative.  PAST MEDICAL HISTORY: Past Medical History:  Diagnosis Date   Actinic keratosis    Basal cell carcinoma 10/21/2017   left lateral neck, excised 02/09/2018   Basal cell carcinoma (BCC) 05/12/2019   right posterior ear, excised 05/31/2019   Blood transfusion without reported diagnosis    Diabetes mellitus without complication ([place])     PAST SURGICAL HISTORY: Past Surgical History:  Procedure Laterality Date   COLONOSCOPY WITH PROPOFOL N/A 04/29/2019   Procedure: COLONOSCOPY WITH PROPOFOL;  Surgeon: Jonathon Bellows, MD;  Location: [facility] ENDOSCOPY;  Service: Gastroenterology;  Laterality: N/A;   ESOPHAGOGASTRODUODENOSCOPY (EGD) WITH PROPOFOL N/A 04/29/2019   Procedure: ESOPHAGOGASTRODUODENOSCOPY (EGD) WITH PROPOFOL;  Surgeon: Jonathon Bellows, MD;  Location: [facility] ENDOSCOPY;  Service: Gastroenterology;  Laterality: N/A;    FAMILY HISTORY: No family history on file.  ADVANCED DIRECTIVES (Y/N):  N  HEALTH MAINTENANCE: Social History   Tobacco Use   Smoking status: Former   Smokeless tobacco: Never  Scientific laboratory technician Use: Never used  Substance Use Topics   Alcohol use: Not Currently    Comment: rare once a year   Drug use: Never     Colonoscopy:  PAP:  Bone density:  Lipid panel:  Allergies  Allergen Reactions   Aspirin Other (See Comments)    GI bleed   Codeine Nausea Only  and Nausea And Vomiting    Current Outpatient Medications  Medication Sig Dispense Refill   carvedilol (COREG) 3.125 MG tablet Take 1 tablet (3.125 mg total) by mouth 2 (two)  times daily with a meal. 60 tablet 0   hydrALAZINE (APRESOLINE) 50 MG tablet Take 1 tablet (50 mg total) by mouth 2 (two) times daily. 60 tablet 0   isosorbide mononitrate (IMDUR) 30 MG 24 hr tablet Take 30 mg by mouth every evening.      Multiple Vitamin (MULTIVITAMIN) capsule Take 1 capsule by mouth daily.     olmesartan (BENICAR) 40 MG tablet Take 1 tablet (40 mg total) by mouth daily. 30 tablet 0   terazosin (HYTRIN) 10 MG capsule Take 10 mg by mouth every evening.      vitamin B-12 (CYANOCOBALAMIN) 1000 MCG tablet Take 1 tablet (1,000 mcg total) by mouth daily. 30 tablet 0   pantoprazole (PROTONIX) 40 MG tablet Take 1 tablet (40 mg total) by mouth daily. 30 tablet 0   No current facility-administered medications for this visit.    OBJECTIVE: Vitals:   12/28/20 1016  BP: (!) 151/72  Pulse: 61  Temp: 97.9 F (36.6 C)  SpO2: 99%     Body mass index is 28.44 kg/m.    ECOG FS:0 - Asymptomatic  Physical Exam Constitutional:      Appearance: Normal appearance.  HENT:     Head: Normocephalic and atraumatic.  Eyes:     Pupils: Pupils are equal, round, and reactive to light.  Cardiovascular:     Rate and Rhythm: Normal rate and regular rhythm.     Heart sounds: Normal heart sounds. No murmur heard. Pulmonary:     Effort: Pulmonary effort is normal.     Breath sounds: Normal breath sounds. No wheezing.  Abdominal:     General: Bowel sounds are normal. There is no distension.     Palpations: Abdomen is soft.     Tenderness: There is no abdominal tenderness.  Musculoskeletal:        General: Normal range of motion.     Cervical back: Normal range of motion.  Skin:    General: Skin is warm and dry.     Findings: No rash.  Neurological:     Mental Status: He is alert and oriented to person, place, and time.  Psychiatric:        Judgment: Judgment normal.     LAB RESULTS:  Lab Results  Component Value Date   NA 139 11/30/2020   K 4.1 11/30/2020   CL 113 (H) 11/30/2020    CO2 20 (L) 11/30/2020   GLUCOSE 117 (H) 11/30/2020   BUN 61 (H) 11/30/2020   CREATININE 4.69 (H) 11/30/2020   CALCIUM 8.0 (L) 11/30/2020   PROT 5.3 (L) 11/30/2020   ALBUMIN 2.8 (L) 11/30/2020   AST 20 11/30/2020   ALT 14 11/30/2020   ALKPHOS 20 (L) 11/30/2020   BILITOT 0.3 11/30/2020   GFRNONAA 12 (L) 11/30/2020   GFRAA 20 (L) 07/05/2019    Lab Results  Component Value Date   WBC 4.3 12/28/2020   NEUTROABS 3.3 12/28/2020   HGB 8.4 (L) 12/28/2020   HCT 26.7 (L) 12/28/2020   MCV 91.4 12/28/2020   PLT 168 12/28/2020   Lab Results  Component Value Date   IRON 35 (L) 12/13/2020   TIBC 241 (L) 12/13/2020   IRONPCTSAT 15 (L) 12/13/2020   Lab Results  Component Value Date   FERRITIN 140 12/13/2020  STUDIES: No results found.   ASSESSMENT: Anemia associated with chronic renal failure, iron deficiency anemia.  PLAN:    1.  Acute GI bleed-history of iron deficiency and CKD: Receives intermittent IV iron last given on 12/07/20-12/20/20 (5 doses of IV Venofer).  States he has been receiving iron for several years now.  He has had problems with hemorrhoids in the past and is status post banding.  Previously colonoscopy/EGD was unrevealing but did show 2 polyps that were removed.  He recently received 3 units of packed red blood cells followed by 4 IV Venofer infusions.  Labs from 12/28/2020 show a ferritin of 169, iron saturation 18% and hemoglobin of 8.4.  This is an improvement.  Recommend 40,000 units Retacrit today.  Given recent blood loss, would recommend rechecking on nutritional deficiencies given he was low and several in the past.  2.  Chronic renal insufficiency:  He was seen by Dr. Juleen China while hospitalized.  Previously received 40,000 units Retacrit with no significant improvement of his hemoglobin.  This was discontinued.  Most recent creatinine is 4.69.  Hemoglobin continues to be low at 8.4.  Recommend 40,000 units Retacrit today to maintain hemoglobin closer to  10.  Hold Retacrit if hemoglobin is over 10.  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  We have not checked these labs in quite some time.  We will repeat at his next visit.  Disposition- Retacrit today.  RTC in 2 months with repeat labs (CBC, CMP, myeloma panel, ferritin, iron, B12 and folate) plus or minus IV iron or Retacrit.  I spent 15 minutes dedicated to the care of this patient (face-to-face and non-face-to-face) on the date of the encounter to include what is described in the assessment and plan.  Patient expressed understanding and was in agreement with this plan. He also understands that He can call clinic at any time with any questions, concerns, or complaints.    Jacquelin Hawking, NP   12/28/2020 10:47 AM

## 2020-12-28 NOTE — Progress Notes (Signed)
Pt reports food tasting bland since hospital visit and mild nausea but denies vomiting. Pt reports bp being higher since hospital stay as well. Currently 151/72

## 2021-01-23 ENCOUNTER — Inpatient Hospital Stay (HOSPITAL_BASED_OUTPATIENT_CLINIC_OR_DEPARTMENT_OTHER): Payer: Medicare Other | Admitting: Hospice and Palliative Medicine

## 2021-01-23 ENCOUNTER — Inpatient Hospital Stay
Admission: EM | Admit: 2021-01-23 | Discharge: 2021-01-31 | DRG: 444 | Disposition: A | Discharge status: Home / Self Care | Payer: Medicare Other | Attending: Student | Admitting: Student

## 2021-01-23 ENCOUNTER — Emergency Department: Payer: Medicare Other

## 2021-01-23 ENCOUNTER — Other Ambulatory Visit: Payer: Self-pay

## 2021-01-23 ENCOUNTER — Inpatient Hospital Stay: Payer: Medicare Other

## 2021-01-23 ENCOUNTER — Telehealth: Payer: Self-pay | Admitting: *Deleted

## 2021-01-23 ENCOUNTER — Observation Stay: Payer: Medicare Other

## 2021-01-23 VITALS — BP 98/44 | HR 65 | Temp 98.3°F | Resp 18

## 2021-01-23 DIAGNOSIS — K8020 Calculus of gallbladder without cholecystitis without obstruction: Secondary | ICD-10-CM

## 2021-01-23 DIAGNOSIS — R509 Fever, unspecified: Secondary | ICD-10-CM | POA: Insufficient documentation

## 2021-01-23 DIAGNOSIS — E538 Deficiency of other specified B group vitamins: Secondary | ICD-10-CM | POA: Insufficient documentation

## 2021-01-23 DIAGNOSIS — Z79899 Other long term (current) drug therapy: Secondary | ICD-10-CM | POA: Insufficient documentation

## 2021-01-23 DIAGNOSIS — E1122 Type 2 diabetes mellitus with diabetic chronic kidney disease: Secondary | ICD-10-CM | POA: Diagnosis present

## 2021-01-23 DIAGNOSIS — R7989 Other specified abnormal findings of blood chemistry: Secondary | ICD-10-CM | POA: Diagnosis present

## 2021-01-23 DIAGNOSIS — R112 Nausea with vomiting, unspecified: Secondary | ICD-10-CM | POA: Insufficient documentation

## 2021-01-23 DIAGNOSIS — E872 Acidosis, unspecified: Secondary | ICD-10-CM

## 2021-01-23 DIAGNOSIS — D631 Anemia in chronic kidney disease: Secondary | ICD-10-CM | POA: Insufficient documentation

## 2021-01-23 DIAGNOSIS — N400 Enlarged prostate without lower urinary tract symptoms: Secondary | ICD-10-CM | POA: Diagnosis not present

## 2021-01-23 DIAGNOSIS — K8001 Calculus of gallbladder with acute cholecystitis with obstruction: Secondary | ICD-10-CM | POA: Diagnosis not present

## 2021-01-23 DIAGNOSIS — N184 Chronic kidney disease, stage 4 (severe): Secondary | ICD-10-CM | POA: Diagnosis not present

## 2021-01-23 DIAGNOSIS — N189 Chronic kidney disease, unspecified: Secondary | ICD-10-CM | POA: Diagnosis not present

## 2021-01-23 DIAGNOSIS — N179 Acute kidney failure, unspecified: Secondary | ICD-10-CM

## 2021-01-23 DIAGNOSIS — Z992 Dependence on renal dialysis: Secondary | ICD-10-CM

## 2021-01-23 DIAGNOSIS — I129 Hypertensive chronic kidney disease with stage 1 through stage 4 chronic kidney disease, or unspecified chronic kidney disease: Secondary | ICD-10-CM | POA: Insufficient documentation

## 2021-01-23 DIAGNOSIS — R1011 Right upper quadrant pain: Secondary | ICD-10-CM

## 2021-01-23 DIAGNOSIS — K8010 Calculus of gallbladder with chronic cholecystitis without obstruction: Secondary | ICD-10-CM | POA: Diagnosis present

## 2021-01-23 DIAGNOSIS — E782 Mixed hyperlipidemia: Secondary | ICD-10-CM

## 2021-01-23 DIAGNOSIS — N183 Chronic kidney disease, stage 3 unspecified: Secondary | ICD-10-CM | POA: Insufficient documentation

## 2021-01-23 DIAGNOSIS — Z886 Allergy status to analgesic agent status: Secondary | ICD-10-CM | POA: Insufficient documentation

## 2021-01-23 DIAGNOSIS — R7401 Elevation of levels of liver transaminase levels: Secondary | ICD-10-CM | POA: Insufficient documentation

## 2021-01-23 DIAGNOSIS — I4819 Other persistent atrial fibrillation: Secondary | ICD-10-CM | POA: Diagnosis not present

## 2021-01-23 DIAGNOSIS — E785 Hyperlipidemia, unspecified: Secondary | ICD-10-CM | POA: Diagnosis present

## 2021-01-23 DIAGNOSIS — R630 Anorexia: Secondary | ICD-10-CM | POA: Diagnosis present

## 2021-01-23 DIAGNOSIS — D472 Monoclonal gammopathy: Secondary | ICD-10-CM | POA: Diagnosis present

## 2021-01-23 DIAGNOSIS — K819 Cholecystitis, unspecified: Secondary | ICD-10-CM | POA: Diagnosis not present

## 2021-01-23 DIAGNOSIS — Z85828 Personal history of other malignant neoplasm of skin: Secondary | ICD-10-CM

## 2021-01-23 DIAGNOSIS — K219 Gastro-esophageal reflux disease without esophagitis: Secondary | ICD-10-CM | POA: Diagnosis present

## 2021-01-23 DIAGNOSIS — R531 Weakness: Secondary | ICD-10-CM | POA: Insufficient documentation

## 2021-01-23 DIAGNOSIS — D500 Iron deficiency anemia secondary to blood loss (chronic): Secondary | ICD-10-CM

## 2021-01-23 DIAGNOSIS — E1151 Type 2 diabetes mellitus with diabetic peripheral angiopathy without gangrene: Secondary | ICD-10-CM | POA: Diagnosis present

## 2021-01-23 DIAGNOSIS — R634 Abnormal weight loss: Secondary | ICD-10-CM

## 2021-01-23 DIAGNOSIS — Z885 Allergy status to narcotic agent status: Secondary | ICD-10-CM

## 2021-01-23 DIAGNOSIS — Z66 Do not resuscitate: Secondary | ICD-10-CM | POA: Diagnosis present

## 2021-01-23 DIAGNOSIS — N281 Cyst of kidney, acquired: Secondary | ICD-10-CM | POA: Diagnosis present

## 2021-01-23 DIAGNOSIS — I2510 Atherosclerotic heart disease of native coronary artery without angina pectoris: Secondary | ICD-10-CM | POA: Diagnosis present

## 2021-01-23 DIAGNOSIS — I7789 Other specified disorders of arteries and arterioles: Secondary | ICD-10-CM

## 2021-01-23 DIAGNOSIS — H5461 Unqualified visual loss, right eye, normal vision left eye: Secondary | ICD-10-CM | POA: Diagnosis present

## 2021-01-23 DIAGNOSIS — Z20822 Contact with and (suspected) exposure to covid-19: Secondary | ICD-10-CM | POA: Diagnosis present

## 2021-01-23 DIAGNOSIS — I700 Atherosclerosis of aorta: Secondary | ICD-10-CM | POA: Diagnosis present

## 2021-01-23 DIAGNOSIS — E611 Iron deficiency: Secondary | ICD-10-CM | POA: Insufficient documentation

## 2021-01-23 DIAGNOSIS — Z87891 Personal history of nicotine dependence: Secondary | ICD-10-CM

## 2021-01-23 DIAGNOSIS — R42 Dizziness and giddiness: Secondary | ICD-10-CM | POA: Insufficient documentation

## 2021-01-23 DIAGNOSIS — K8080 Other cholelithiasis without obstruction: Secondary | ICD-10-CM

## 2021-01-23 DIAGNOSIS — Z794 Long term (current) use of insulin: Secondary | ICD-10-CM

## 2021-01-23 DIAGNOSIS — K529 Noninfective gastroenteritis and colitis, unspecified: Secondary | ICD-10-CM | POA: Diagnosis present

## 2021-01-23 DIAGNOSIS — N186 End stage renal disease: Secondary | ICD-10-CM | POA: Diagnosis present

## 2021-01-23 DIAGNOSIS — E860 Dehydration: Secondary | ICD-10-CM | POA: Diagnosis present

## 2021-01-23 DIAGNOSIS — N2581 Secondary hyperparathyroidism of renal origin: Secondary | ICD-10-CM | POA: Diagnosis present

## 2021-01-23 DIAGNOSIS — I120 Hypertensive chronic kidney disease with stage 5 chronic kidney disease or end stage renal disease: Secondary | ICD-10-CM | POA: Diagnosis present

## 2021-01-23 DIAGNOSIS — E1142 Type 2 diabetes mellitus with diabetic polyneuropathy: Secondary | ICD-10-CM | POA: Diagnosis present

## 2021-01-23 LAB — COMPREHENSIVE METABOLIC PANEL
ALT: 189 U/L — ABNORMAL HIGH (ref 0–44)
AST: 682 U/L — ABNORMAL HIGH (ref 15–41)
Albumin: 2.8 g/dL — ABNORMAL LOW (ref 3.5–5.0)
Alkaline Phosphatase: 32 U/L — ABNORMAL LOW (ref 38–126)
Anion gap: 13 (ref 5–15)
BUN: 70 mg/dL — ABNORMAL HIGH (ref 8–23)
CO2: 19 mmol/L — ABNORMAL LOW (ref 22–32)
Calcium: 8 mg/dL — ABNORMAL LOW (ref 8.9–10.3)
Chloride: 102 mmol/L (ref 98–111)
Creatinine, Ser: 7.73 mg/dL — ABNORMAL HIGH (ref 0.61–1.24)
GFR, Estimated: 7 mL/min — ABNORMAL LOW (ref >60)
Glucose, Bld: 121 mg/dL — ABNORMAL HIGH (ref 70–99)
Potassium: 4 mmol/L (ref 3.5–5.1)
Sodium: 134 mmol/L — ABNORMAL LOW (ref 135–145)
Total Bilirubin: 0.8 mg/dL (ref 0.3–1.2)
Total Protein: 6.2 g/dL — ABNORMAL LOW (ref 6.5–8.1)

## 2021-01-23 LAB — FERRITIN: Ferritin: 1157 ng/mL — ABNORMAL HIGH (ref 24–336)

## 2021-01-23 LAB — CBC WITH DIFFERENTIAL/PLATELET
Abs Immature Granulocytes: 0.04 10*3/uL (ref 0.00–0.07)
Basophils Absolute: 0 10*3/uL (ref 0.0–0.1)
Basophils Relative: 0 %
Eosinophils Absolute: 0 10*3/uL (ref 0.0–0.5)
Eosinophils Relative: 0 %
HCT: 30.4 % — ABNORMAL LOW (ref 39.0–52.0)
Hemoglobin: 9.6 g/dL — ABNORMAL LOW (ref 13.0–17.0)
Immature Granulocytes: 1 %
Lymphocytes Relative: 5 %
Lymphs Abs: 0.3 10*3/uL — ABNORMAL LOW (ref 0.7–4.0)
MCH: 28.5 pg (ref 26.0–34.0)
MCHC: 31.6 g/dL (ref 30.0–36.0)
MCV: 90.2 fL (ref 80.0–100.0)
Monocytes Absolute: 0.3 10*3/uL (ref 0.1–1.0)
Monocytes Relative: 5 %
Neutro Abs: 6 10*3/uL (ref 1.7–7.7)
Neutrophils Relative %: 89 %
Platelets: 125 10*3/uL — ABNORMAL LOW (ref 150–400)
RBC: 3.37 MIL/uL — ABNORMAL LOW (ref 4.22–5.81)
RDW: 14.6 % (ref 11.5–15.5)
WBC: 6.7 10*3/uL (ref 4.0–10.5)
nRBC: 0 % (ref 0.0–0.2)

## 2021-01-23 LAB — RESP PANEL BY RT-PCR (FLU A&B, COVID) ARPGX2
Influenza A by PCR: NEGATIVE
Influenza B by PCR: NEGATIVE
SARS Coronavirus 2 by RT PCR: NEGATIVE

## 2021-01-23 LAB — IRON AND TIBC
Iron: 11 ug/dL — ABNORMAL LOW (ref 45–182)
Saturation Ratios: 5 % — ABNORMAL LOW (ref 17.9–39.5)
TIBC: 241 ug/dL — ABNORMAL LOW (ref 250–450)
UIBC: 230 ug/dL

## 2021-01-23 LAB — LIPASE, BLOOD: Lipase: 34 U/L (ref 11–51)

## 2021-01-23 LAB — LACTIC ACID, PLASMA
Lactic Acid, Venous: 2.7 mmol/L (ref 0.5–1.9)
Lactic Acid, Venous: 1.1 mmol/L (ref 0.5–1.9)

## 2021-01-23 LAB — SAMPLE TO BLOOD BANK

## 2021-01-23 IMAGING — US US ABDOMEN LIMITED
1 series · 14 of 25 positions shown · non-contrast
Comparison: CT [DATE]

CLINICAL DATA: Right upper quadrant pain

EXAM:
ULTRASOUND ABDOMEN LIMITED RIGHT UPPER QUADRANT

[Series 1: us abdomen limited ruq (liver/gb) · 14 of 65 slices shown]
[im 1/65]
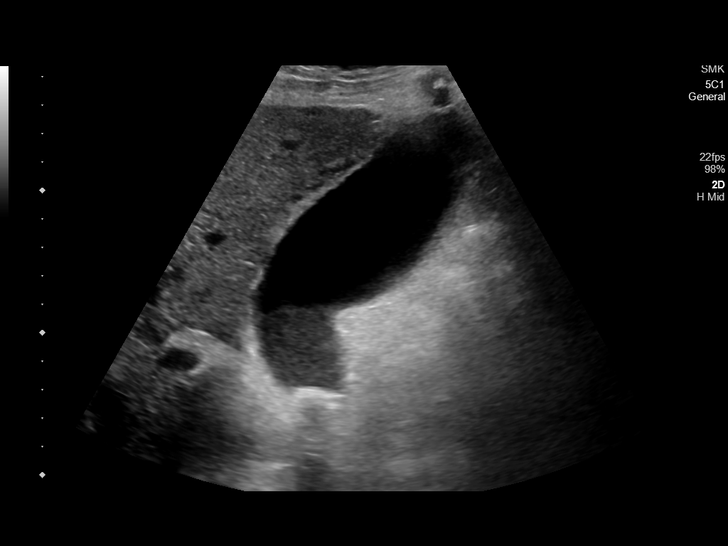
[im 6/65]
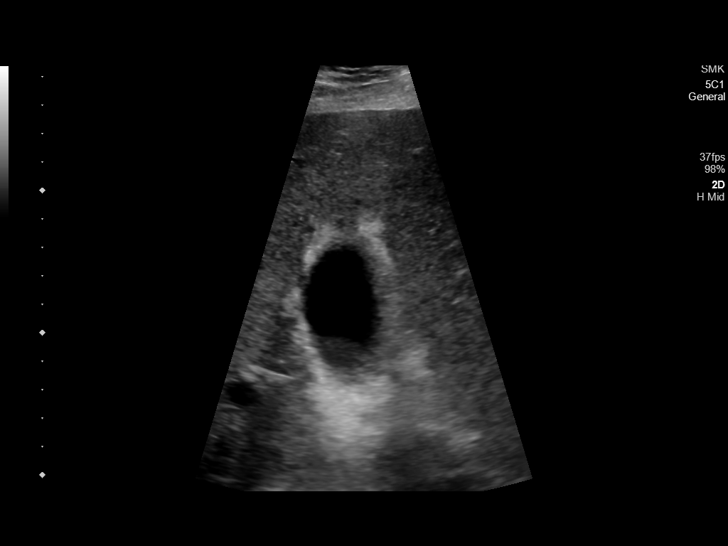
[im 11/65]
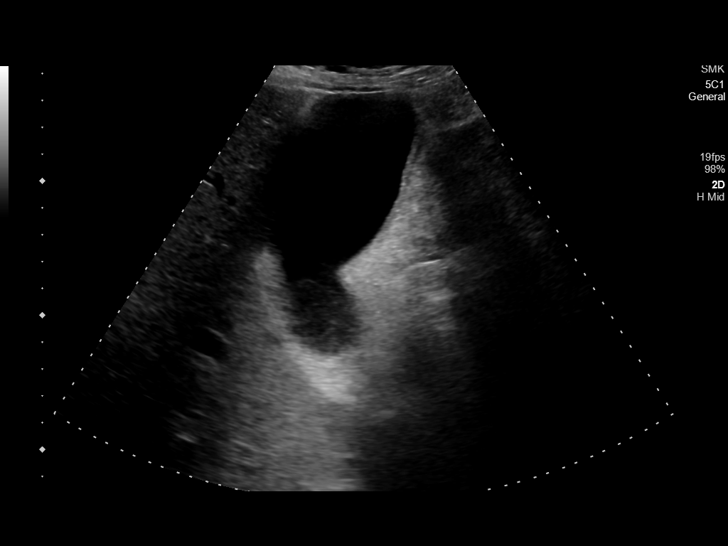
[im 17/65]
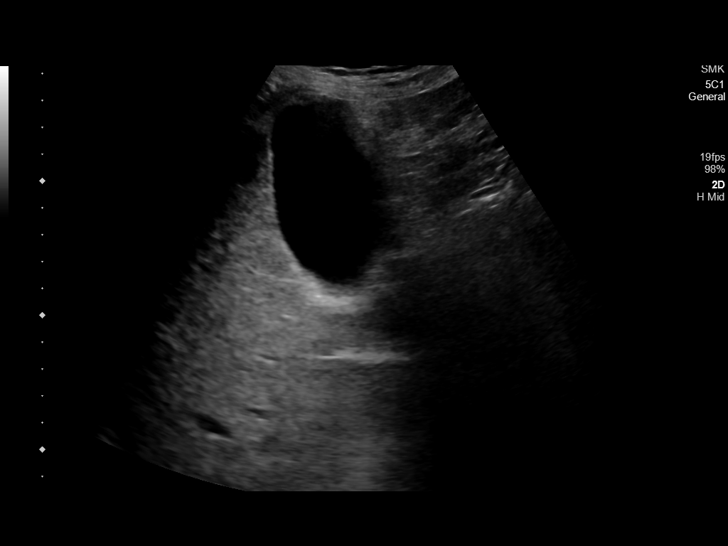
[im 22/65]
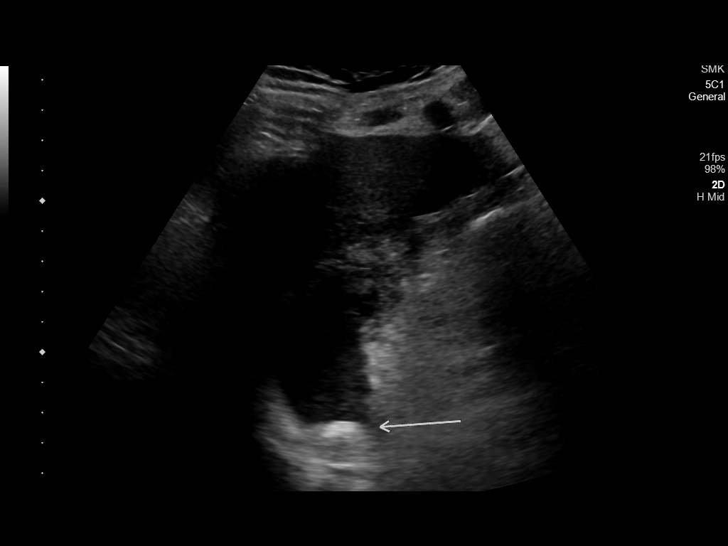
[im 25/65]
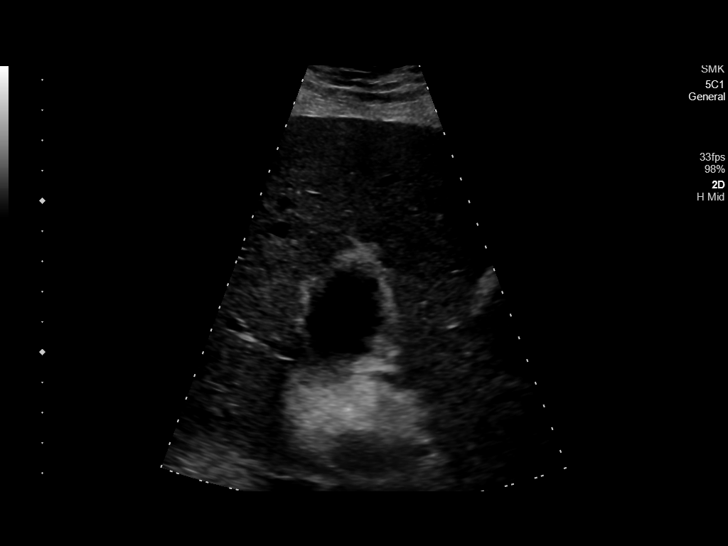
[im 30/65]
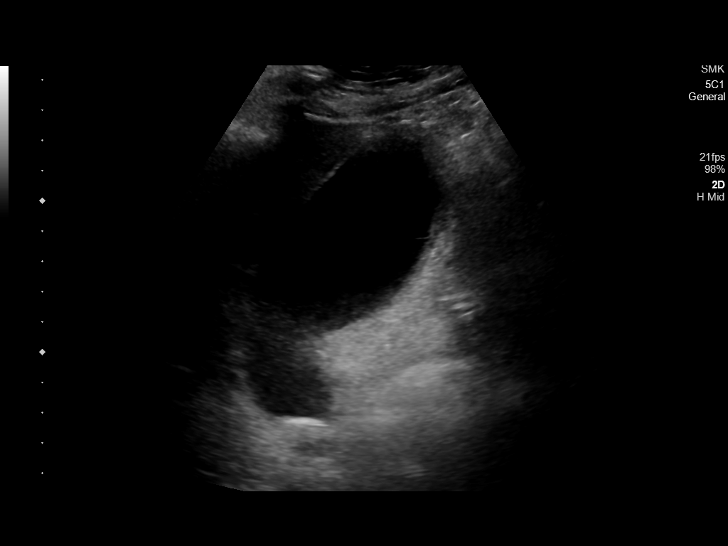
[im 35/65]
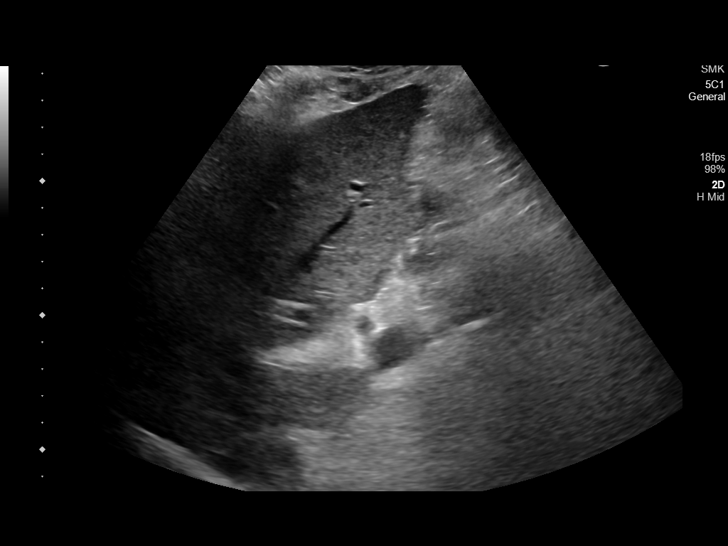
[im 41/65]
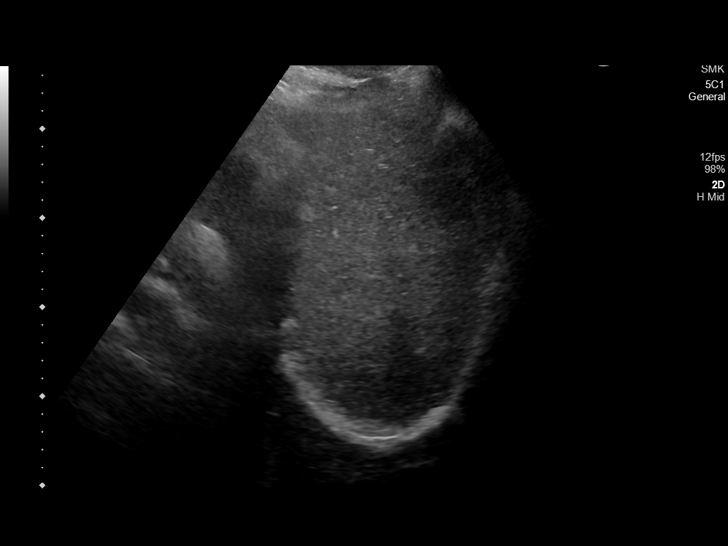
[im 43/65]
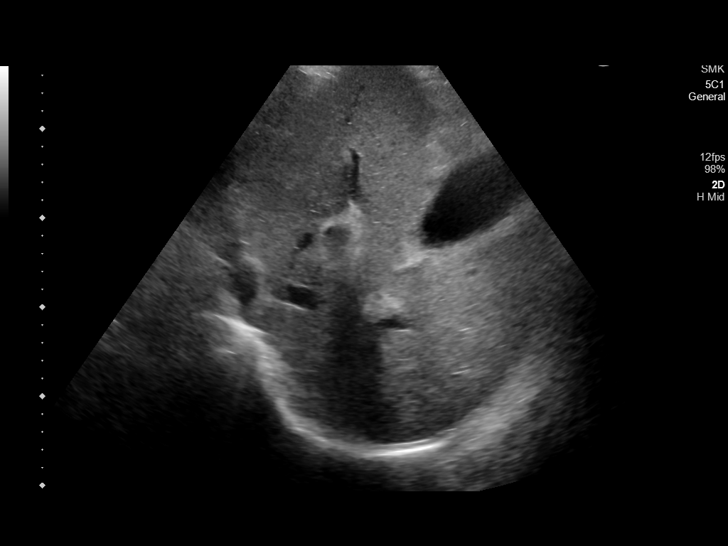
[im 49/65]
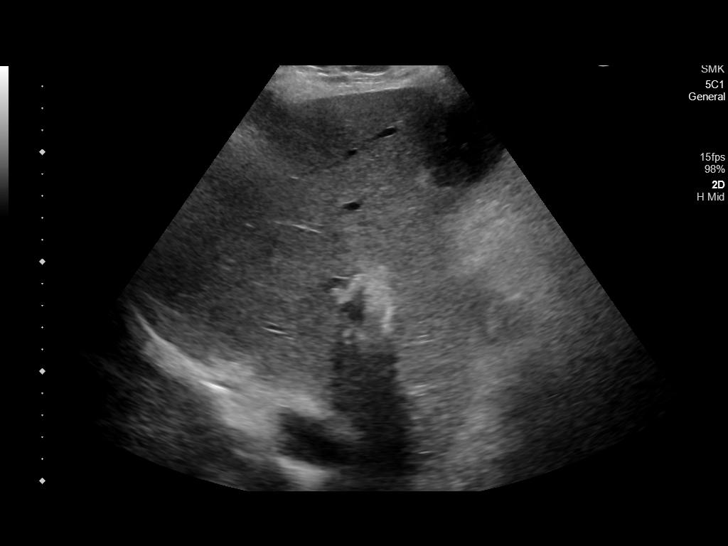
[im 54/65]
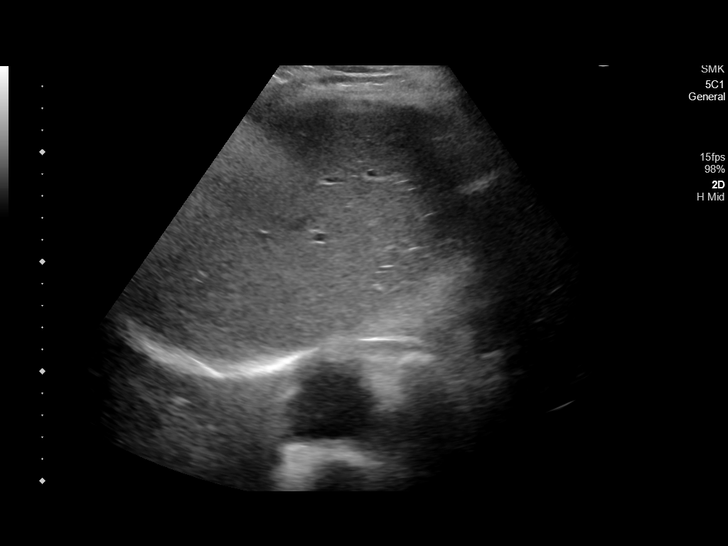
[im 59/65]
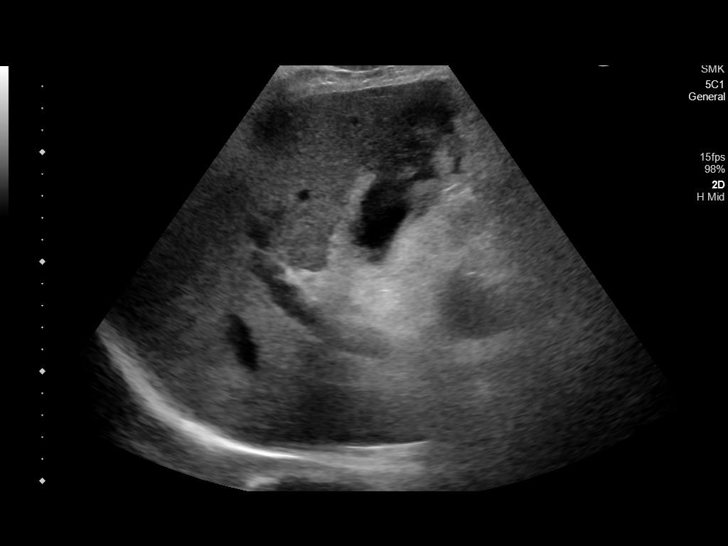
[im 65/65]
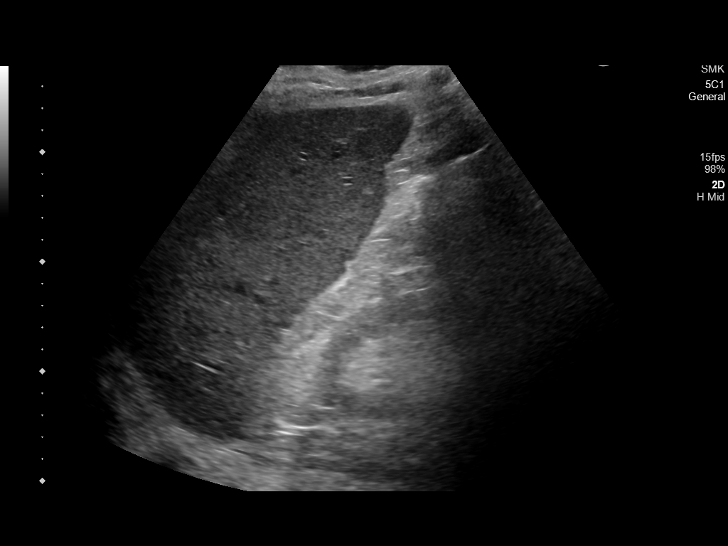

[14 of 25 positions shown; findings below may reference images not displayed]

FINDINGS: Gallbladder:

Sludge within the gallbladder. 16 mm non mobile stone at the
gallbladder neck. Slight increased wall thickness of 3.7 mm but
negative sonographic Murphy.

Common bile duct:

Diameter: 5.2 mm

Liver:

No focal lesion identified. Within normal limits in parenchymal
echogenicity. Portal vein is patent on color Doppler imaging with
normal direction of blood flow towards the liver.

Other: Right kidney cortex appears slightly echogenic.
IMPRESSION: 1. Sludge in the gallbladder with non mobile stone at the
gallbladder neck with slight increased wall thickness but negative
sonographic Murphy. Findings are indeterminate for cholecystitis,
consider correlation with nuclear medicine hepatobiliary imaging
2. Right kidney cortex appears slightly echogenic suggesting medical
renal disease, correlate with appropriate laboratory values

## 2021-01-23 IMAGING — DX DG CHEST 1V PORT
1 series · 2 of 2 positions shown · non-contrast
Comparison: None.

CLINICAL DATA: Weakness

Increase lactic acid
EXAM:
PORTABLE CHEST 1 VIEW

[Series 1: chest ap · 0.14mm/px · 2 of 2 slices shown]
[im 1/2]
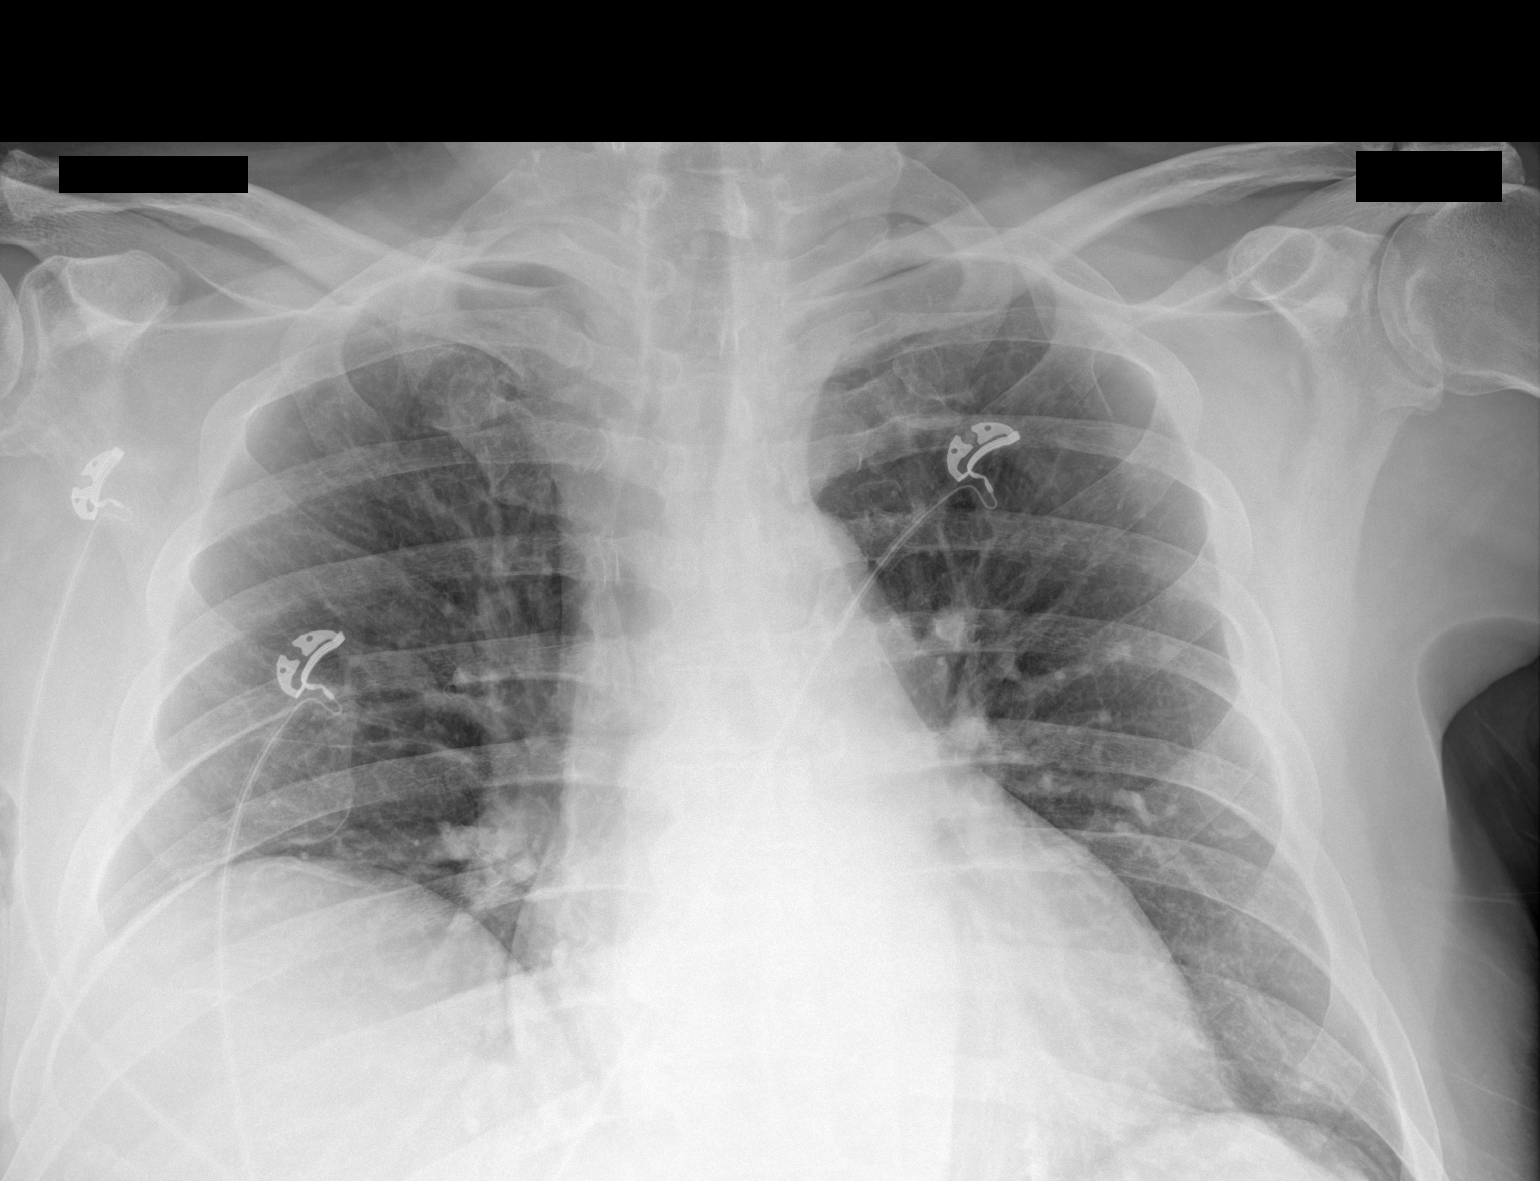
[im 2/2]
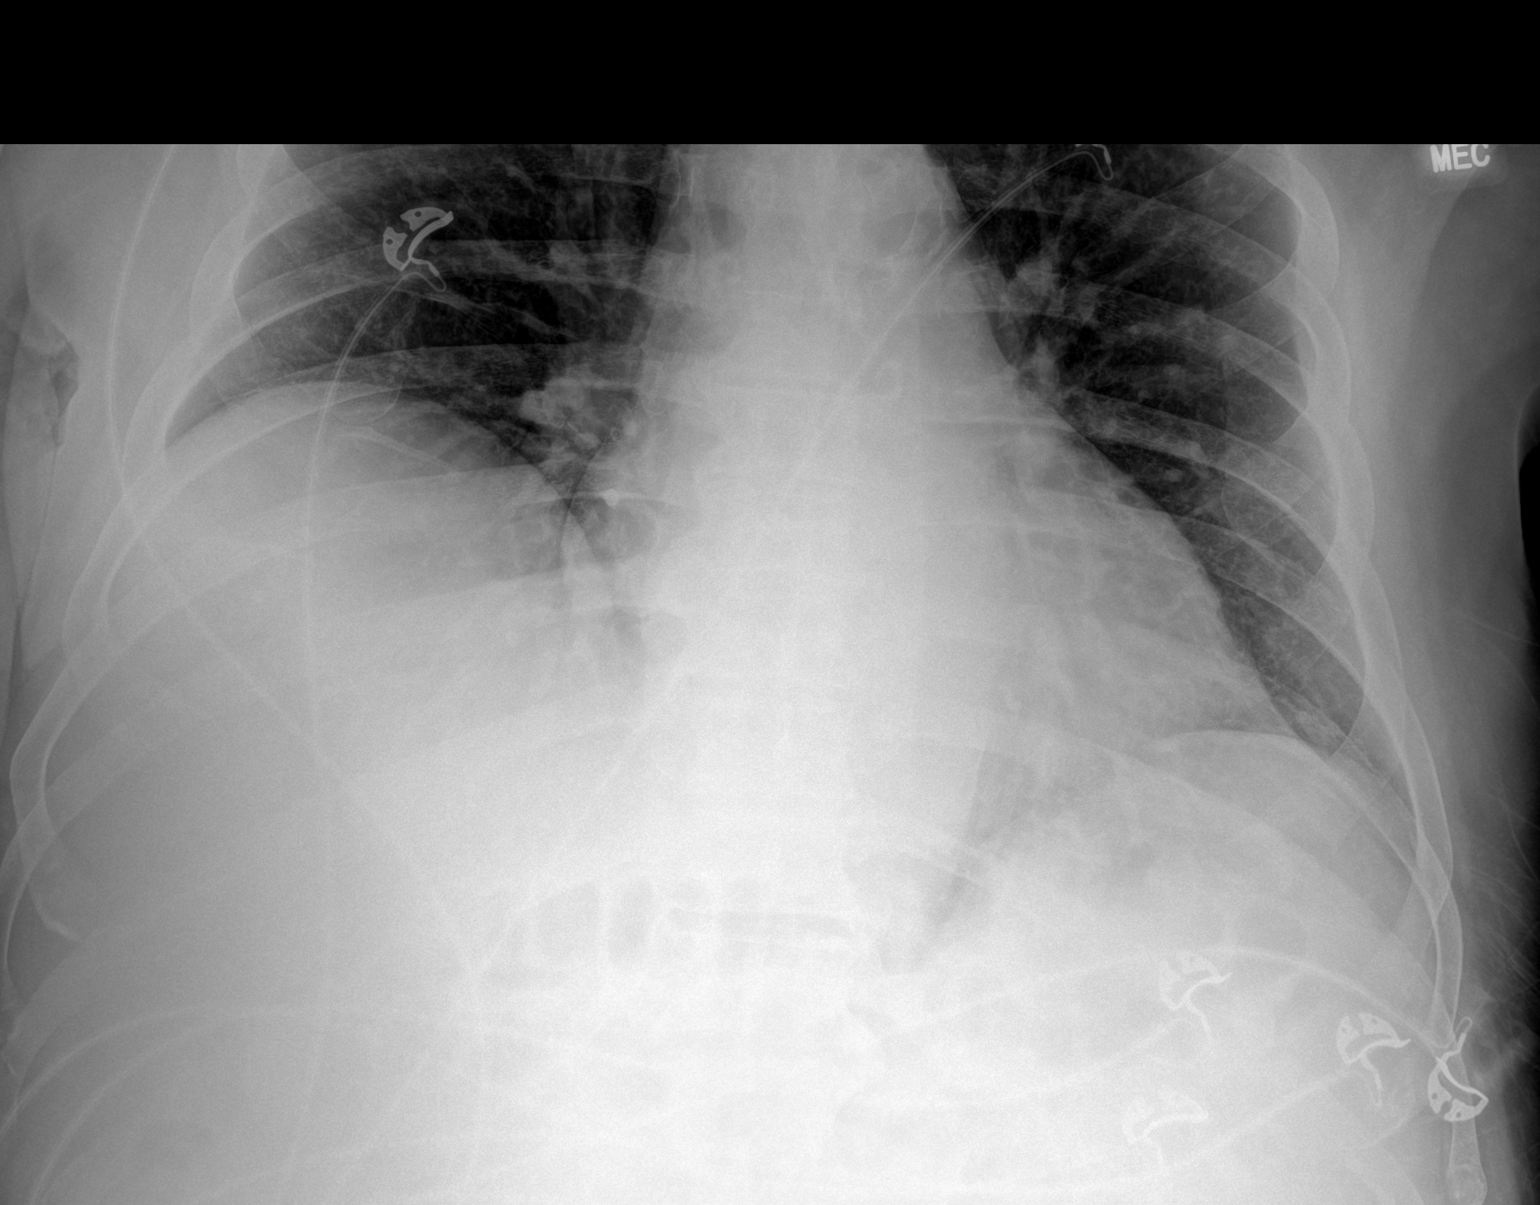

[2 of 2 positions shown; findings below may reference images not displayed]

FINDINGS: Heart size within normal limits. No pulmonary vascular congestion.
Right hemidiaphragm is elevated. Lungs are clear.
IMPRESSION: No acute cardiopulmonary process.

## 2021-01-23 MED ORDER — VITAMIN B-12 1000 MCG PO TABS
1000.0000 ug | ORAL_TABLET | Freq: Every day | ORAL | Status: DC
  Administered 2021-01-24 – 2021-01-31 (×7): 1000 ug via ORAL
  Filled 2021-01-23 (×7): qty 1

## 2021-01-23 MED ORDER — METRONIDAZOLE 500 MG/100ML IV SOLN
500.0000 mg | Freq: Three times a day (TID) | INTRAVENOUS | Status: DC
  Administered 2021-01-23 – 2021-01-24 (×3): 500 mg via INTRAVENOUS
  Filled 2021-01-23 (×7): qty 100

## 2021-01-23 MED ORDER — CARVEDILOL 3.125 MG PO TABS
3.1250 mg | ORAL_TABLET | Freq: Two times a day (BID) | ORAL | Status: DC
  Administered 2021-01-24 – 2021-01-29 (×10): 3.125 mg via ORAL
  Filled 2021-01-23 (×10): qty 1

## 2021-01-23 MED ORDER — PANTOPRAZOLE SODIUM 40 MG PO TBEC
40.0000 mg | DELAYED_RELEASE_TABLET | Freq: Every day | ORAL | Status: DC
  Administered 2021-01-24 – 2021-01-31 (×7): 40 mg via ORAL
  Filled 2021-01-23 (×7): qty 1

## 2021-01-23 MED ORDER — SODIUM CHLORIDE 0.9 % IV SOLN
2.0000 g | Freq: Once | INTRAVENOUS | Status: AC
  Administered 2021-01-24: 2 g via INTRAVENOUS
  Filled 2021-01-23: qty 2

## 2021-01-23 MED ORDER — TRAMADOL HCL 50 MG PO TABS: 50.0000 mg | ORAL_TABLET | Freq: Three times a day (TID) | ORAL | Status: DC | PRN

## 2021-01-23 MED ORDER — ISOSORBIDE MONONITRATE ER 30 MG PO TB24
30.0000 mg | ORAL_TABLET | Freq: Every evening | ORAL | Status: DC
  Administered 2021-01-24 – 2021-01-30 (×7): 30 mg via ORAL
  Filled 2021-01-23 (×7): qty 1

## 2021-01-23 MED ORDER — HEPARIN SODIUM (PORCINE) 5000 UNIT/ML IJ SOLN
5000.0000 [IU] | Freq: Three times a day (TID) | INTRAMUSCULAR | Status: DC
  Administered 2021-01-24 – 2021-01-30 (×16): 5000 [IU] via SUBCUTANEOUS
  Filled 2021-01-23 (×16): qty 1

## 2021-01-23 MED ORDER — ONDANSETRON HCL 4 MG PO TABS
4.0000 mg | ORAL_TABLET | Freq: Four times a day (QID) | ORAL | Status: DC | PRN
Start: 2021-01-23 — End: 2021-01-31

## 2021-01-23 MED ORDER — SODIUM CHLORIDE 0.9 % IV BOLUS
1000.0000 mL | Freq: Once | INTRAVENOUS | Status: AC
  Administered 2021-01-23: 1000 mL via INTRAVENOUS

## 2021-01-23 MED ORDER — ONDANSETRON HCL 4 MG/2ML IJ SOLN
4.0000 mg | Freq: Four times a day (QID) | INTRAMUSCULAR | Status: DC | PRN
Start: 2021-01-23 — End: 2021-01-31

## 2021-01-23 MED ORDER — ADULT MULTIVITAMIN W/MINERALS CH
1.0000 | ORAL_TABLET | Freq: Every day | ORAL | Status: DC
  Administered 2021-01-24 – 2021-01-31 (×7): 1 via ORAL
  Filled 2021-01-23 (×7): qty 1

## 2021-01-23 NOTE — ED Provider Notes (Signed)
[facility] Emergency Department Provider Note  ____________________________________________   Event Date/Time   First MD Initiated Contact with Patient 01/23/21 1310     (approximate)  I have reviewed the triage vital signs and the nursing notes.   HISTORY  Chief Complaint No chief complaint on file.    HPI Gregory Dixon is a 80 y.o. male with past medical history as below here with generalized weakness and loss of appetite.  The patient presents with progressive worsening generalized weakness and weight loss over the last month.  He reports that he has lost up to 15 pounds.  He states that he has not wanted to eat or drink.  He states that this is because his taste has been off.  He has lost energy and has been having difficulty doing what he normally does.  He has had some associated worsening fatigue over the last several days.  He states he feels generally weak.  Denies any focal numbness weakness.  Denies any chest pain or shortness of breath.  No fevers.  He states he just does not want to eat, not that eating creates any kind of nausea, pain, or change in bowel habits.  Denies known history of liver disease.  He states his urine has been slightly darker than usual.  No regular NSAID use.  He went to oncology today, and was sent here after lab work showed significant acute kidney injury.    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   Chronic kidney disease 05/11/2019   Hyperlipidemia 05/11/2019   Hypertension, 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   Anemia due to vitamin B12 deficiency 05/05/2019   Benign prostatic hyperplasia 05/05/2019   Acute lower GI bleeding 04/27/2019   Acute blood loss anemia 04/27/2019   Acute kidney injury superimposed on CKD ([place]) 04/27/2019   Iron deficiency anemia due to chronic blood loss 04/27/2019   Type 2 diabetes mellitus with stage 4 chronic kidney disease, without long-term current use of insulin ([place]) 10/16/2015   Essential hypertension 10/16/2015   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   Atherosclerosis 09/29/2012   Cholelithiasis 09/29/2012   Nephrolithiasis 09/29/2012   Diastasis recti 08/23/2012   Diabetic peripheral neuropathy associated with type 2 diabetes mellitus ([place]) 02/20/2011   Heart murmur, systolic 37/01/6268   Loss of feeling or sensation 02/20/2011   Obesity 02/20/2011    Past Surgical History:  Procedure Laterality Date   COLONOSCOPY WITH PROPOFOL N/A 04/29/2019   Procedure: COLONOSCOPY WITH PROPOFOL;  Surgeon: Jonathon Bellows, MD;  Location: [facility] ENDOSCOPY;  Service: Gastroenterology;  Laterality: N/A;   ESOPHAGOGASTRODUODENOSCOPY (EGD) WITH PROPOFOL N/A 04/29/2019   Procedure: ESOPHAGOGASTRODUODENOSCOPY (EGD) WITH PROPOFOL;  Surgeon: Jonathon Bellows, MD;  Location: [facility] ENDOSCOPY;  Service: Gastroenterology;  Laterality: N/A;    Prior to Admission medications   Medication Sig Start Date End Date Taking? Authorizing Provider  carvedilol (COREG) 3.125 MG tablet Take 1 tablet (3.125 mg total) by  mouth 2 (two) times daily with a meal. 11/25/20  Yes Wieting, Richard, MD  hydrALAZINE (APRESOLINE) 50 MG tablet Take 1 tablet (50 mg total) by mouth 2 (two) times daily. 11/25/20  Yes Wieting, Richard, MD  isosorbide mononitrate (IMDUR) 30 MG 24 hr tablet Take 30 mg by mouth every evening.  03/14/19  Yes [provider]  Multiple Vitamin (MULTIVITAMIN)  capsule Take 1 capsule by mouth daily.   Yes [provider]  olmesartan-hydrochlorothiazide (BENICAR HCT) 40-25 MG tablet Take 1 tablet by mouth daily. 01/21/21  Yes [provider]  terazosin (HYTRIN) 10 MG capsule Take 10 mg by mouth every evening.  03/13/19  Yes [provider]  vitamin B-12 (CYANOCOBALAMIN) 1000 MCG tablet Take 1 tablet (1,000 mcg total) by mouth daily. 04/29/19  Yes Wieting, Richard, MD  olmesartan (BENICAR) 40 MG tablet Take 1 tablet (40 mg total) by mouth daily. Patient not taking: Reported on 01/23/2021 11/25/20   Loletha Grayer, MD  pantoprazole (PROTONIX) 40 MG tablet Take 1 tablet (40 mg total) by mouth daily. 11/25/20 12/25/20  Loletha Grayer, MD    Allergies Aspirin and Codeine  No family history on file.  Social History Social History   Tobacco Use   Smoking status: Former   Smokeless tobacco: Never  Scientific laboratory technician Use: Never used  Substance Use Topics   Alcohol use: Not Currently    Comment: rare once a year   Drug use: Never    Review of Systems  Review of Systems  Constitutional:  Positive for fatigue and unexpected weight change. Negative for chills and fever.  HENT:  Negative for sore throat.   Respiratory:  Negative for shortness of breath.   Cardiovascular:  Negative for chest pain.  Gastrointestinal:  Positive for nausea. Negative for abdominal pain.  Genitourinary:  Negative for flank pain.  Musculoskeletal:  Negative for neck pain.  Skin:  Negative for rash and wound.  Allergic/Immunologic: Negative for immunocompromised state.  Neurological:  Positive for weakness. Negative for numbness.  Hematological:  Does not bruise/bleed easily.  All other systems reviewed and are negative.   ____________________________________________  PHYSICAL EXAM:      VITAL SIGNS: ED Triage Vitals  Enc Vitals Group     BP 01/23/21 1306 (!) 80/31     Pulse Rate 01/23/21 1306 61     Resp 01/23/21 1306 20     Temp  01/23/21 1306 98.4 F (36.9 C)     Temp Source 01/23/21 1306 Oral     SpO2 01/23/21 1306 94 %     Weight 01/23/21 1304 215 lb (97.5 kg)     Height 01/23/21 1304 6\' 6"  (1.981 m)     Head Circumference --      Peak Flow --      Pain Score 01/23/21 1304 0     Pain Loc --      Pain Edu? --      Excl. in [place]? --      Physical Exam Vitals and nursing note reviewed.  Constitutional:      General: He is not in acute distress.    Appearance: He is well-developed.  HENT:     Head: Normocephalic and atraumatic.     Mouth/Throat:     Mouth: Mucous membranes are dry.  Eyes:     Conjunctiva/sclera: Conjunctivae normal.  Cardiovascular:     Rate and Rhythm: Normal rate and regular rhythm.     Heart sounds: Normal heart sounds. No  murmur heard.   No friction rub.  Pulmonary:     Effort: Pulmonary effort is normal. No respiratory distress.     Breath sounds: Normal breath sounds. No wheezing or rales.  Abdominal:     General: There is no distension.     Palpations: Abdomen is soft.     Tenderness: There is no abdominal tenderness.  Musculoskeletal:     Cervical back: Neck supple.  Skin:    General: Skin is warm.     Capillary Refill: Capillary refill takes less than 2 seconds.  Neurological:     Mental Status: He is alert and oriented to person, place, and time.     Motor: No abnormal muscle tone.      ____________________________________________   LABS (all labs ordered are listed, but only abnormal results are displayed)  Labs Reviewed  LACTIC ACID, PLASMA - Abnormal; Notable for the following components:      Result Value   Lactic Acid, Venous 2.7 (*)    All other components within normal limits  RESP PANEL BY RT-PCR (FLU A&B, COVID) ARPGX2  CULTURE, BLOOD (ROUTINE X 2)  CULTURE, BLOOD (ROUTINE X 2)  LACTIC ACID, PLASMA  LIPASE, BLOOD  URINALYSIS, ROUTINE W REFLEX MICROSCOPIC  BASIC METABOLIC PANEL  CBC  HEPATITIS PANEL, ACUTE     ____________________________________________  EKG:  ________________________________________  RADIOLOGY All imaging, including plain films, CT scans, and ultrasounds, independently reviewed by me, and interpretations confirmed via formal radiology reads.  ED MD interpretation:   CT A/P: 1 cm gallstone in gallbldder neck RUQ U/S: sludge in GB with non mobile stone, slight increased wall thickness but negative sonographic Percell Miller  Official radiology report(s): CT ABDOMEN PELVIS WO CONTRAST  Result Date: 01/23/2021 CLINICAL DATA:  Abdominal pain, acute, nonlocalized. Generalized weakness. Hypotension. EXAM: CT ABDOMEN AND PELVIS WITHOUT CONTRAST TECHNIQUE: Multidetector CT imaging of the abdomen and pelvis was performed following the standard protocol without IV contrast. COMPARISON:  08/09/2012 FINDINGS: Lower chest: Linear scarring or atelectasis at the right lung base. Otherwise no active lower chest finding. Hepatobiliary: Liver appears normal without contrast. 1 cm calcified stone in the gallbladder neck without definite CT evidence of cholecystitis. Consider right upper quadrant ultrasound if concern persists regarding possible clinical cholecystitis. Pancreas: Normal Spleen: Normal Adrenals/Urinary Tract: Adrenal glands are normal. Multiple bilateral renal cysts. Nonobstructing 6 mm stone in the lower pole of the left kidney. No passing stone or hydronephrosis. No stone in the bladder. Stomach/Bowel: Stomach and small intestine are normal. No evidence of diverticulosis or diverticulitis. Vascular/Lymphatic: Aortic atherosclerosis. No aneurysm. IVC is normal. No retroperitoneal adenopathy. Reproductive: Enlarged prostate. Other: Tiny amount of free fluid in the pelvic cul-de-sac. No free air. Musculoskeletal: Ordinary lower lumbar degenerative changes. IMPRESSION: 1 cm gallstone in the gallbladder neck. No CT evidence of cholecystitis. Consider right upper quadrant ultrasound if there is  clinical concern regarding potential cholecystitis. Aortic Atherosclerosis (ICD10-I70.0). Multiple renal cysts. 6 mm nonobstructing stone in the lower pole of the left kidney. Enlarged prostate. Electronically Signed   By: Nelson Chimes M.D.   On: 01/23/2021 14:58   DG Chest Port 1 View  Result Date: 01/23/2021 CLINICAL DATA:  Weakness Increase lactic acid EXAM: PORTABLE CHEST 1 VIEW COMPARISON:  None. FINDINGS: Heart size within normal limits. No pulmonary vascular congestion. Right hemidiaphragm is elevated. Lungs are clear. IMPRESSION: No acute cardiopulmonary process. Electronically Signed   By: Miachel Roux M.D.   On: 01/23/2021 16:32   US Abdomen Limited RUQ (LIVER/GB)  Result  Date: 01/23/2021 CLINICAL DATA:  Right upper quadrant pain EXAM: ULTRASOUND ABDOMEN LIMITED RIGHT UPPER QUADRANT COMPARISON:  CT 01/23/2021 FINDINGS: Gallbladder: Sludge within the gallbladder. 16 mm non mobile stone at the gallbladder neck. Slight increased wall thickness of 3.7 mm but negative sonographic Murphy. Common bile duct: Diameter: 5.2 mm Liver: No focal lesion identified. Within normal limits in parenchymal echogenicity. Portal vein is patent on color Doppler imaging with normal direction of blood flow towards the liver. Other: Right kidney cortex appears slightly echogenic. IMPRESSION: 1. Sludge in the gallbladder with non mobile stone at the gallbladder neck with slight increased wall thickness but negative sonographic Murphy. Findings are indeterminate for cholecystitis, consider correlation with nuclear medicine hepatobiliary imaging 2. Right kidney cortex appears slightly echogenic suggesting medical renal disease, correlate with appropriate laboratory values Electronically Signed   By: Donavan Foil M.D.   On: 01/23/2021 15:45    ____________________________________________  PROCEDURES   Procedure(s) performed (including Critical Care):  .1-3 Lead EKG Interpretation Performed by: Duffy Bruce,  MD Authorized by: Duffy Bruce, MD     Interpretation: normal     ECG rate:  60-80   ECG rate assessment: normal     Rhythm: sinus rhythm     Ectopy: none     Conduction: normal   Comments:     Indication: Dehydration, AKI  ____________________________________________  INITIAL IMPRESSION / MDM / ASSESSMENT AND PLAN / ED COURSE  As part of my medical decision making, I reviewed the following data within the [facility] notes reviewed and incorporated, Old chart reviewed, Notes from prior ED visits, and [place] Controlled Substance Database       *Gregory Dixon was evaluated in Emergency Department on 01/23/2021 for the symptoms described in the history of present illness. He was evaluated in the context of the global COVID-19 pandemic, which necessitated consideration that the patient might be at risk for infection with the SARS-CoV-2 virus that causes COVID-19. Institutional protocols and algorithms that pertain to the evaluation of patients at risk for COVID-19 are in a state of rapid change based on information released by regulatory bodies including the CDC and federal and state organizations. These policies and algorithms were followed during the patient's care in the ED.  Some ED evaluations and interventions may be delayed as a result of limited staffing during the pandemic.*     Medical Decision Making:  80 yo M here with weight loss, loss of appetite. Labs reviewed from cancer center show hemoconcentration, transaminitis with AST 682 ALT 189 as well as significant AKI, with Cr >7 from baseline of 4.8. LA 2.7, likely from dehydration and type 2 lactic acidosis. Imaging reviewed as above, CT shows GB stone, U/S with sludge and wall thickening. Pt's weight loss, dehydration, weakness could be related to subacute or chronic cholecystitis. Will consult Dr. Christian Mate. Given hypotension, transaminitis and U/S findings, will start empiric abx, fluids, and plan to admit  to medicine.  ____________________________________________  FINAL CLINICAL IMPRESSION(S) / ED DIAGNOSES  Final diagnoses:  RUQ pain  AKI (acute kidney injury) ([place])  Transaminitis     MEDICATIONS GIVEN DURING THIS VISIT:  Medications  metroNIDAZOLE (FLAGYL) IVPB 500 mg (500 mg Intravenous New Bag/Given 01/23/21 1541)  pantoprazole (PROTONIX) EC tablet 40 mg (has no administration in time range)  vitamin B-12 (CYANOCOBALAMIN) tablet 1,000 mcg (has no administration in time range)  multivitamin with minerals tablet 1 tablet (1 tablet Oral Not Given 01/23/21 1653)  ondansetron (ZOFRAN) tablet 4 mg (has  no administration in time range)    Or  ondansetron (ZOFRAN) injection 4 mg (has no administration in time range)  heparin injection 5,000 Units (has no administration in time range)  traMADol (ULTRAM) tablet 50 mg (has no administration in time range)  ceFEPIme (MAXIPIME) 2 g in sodium chloride 0.9 % 100 mL IVPB (has no administration in time range)  sodium chloride 0.9 % bolus 1,000 mL (0 mLs Intravenous Stopped 01/23/21 1711)  sodium chloride 0.9 % bolus 1,000 mL (0 mLs Intravenous Stopped 01/23/21 1755)     ED Discharge Orders     None        Note:  This document was prepared using Dragon voice recognition software and may include unintentional dictation errors.   Duffy Bruce, MD 01/23/21 [phone]

## 2021-01-23 NOTE — Consult Note (Addendum)
[facility] SURGICAL CONSULTATION NOTE (initial) - cpt: 78295   HISTORY OF PRESENT ILLNESS (HPI):  80 y.o. male presented to [facility] ED today for evaluation of weakness. Patient reports a 15 pound weight loss over the last month, with loss of appetite.  Apparently over the last 3 days his progression progressively worse to the extent of an inability to ambulate.  He reports having chills, but has no known fevers. He denies abdominal pain, chest pain, urinary frequency or dysuria, shortness of breath or cough.  He endorses 2-3 watery bowel movements daily on a chronic basis.  He has had anemia in the past requiring blood transfusions. CT abdomen without contrast reveals a 1 cm gallstone in gallbladder neck, there is no evidence of cholecystitis, this was a noncontrasted scan.  Lactic acid is 2.7.  AST and ALT are markedly elevated while the total bilirubin and alkaline phosphatase are within normal limits. The gallbladder stone was noted to be nonmobile on ultrasound, patient had a slightly thickened gallbladder with negative Murphy sign. Surgery is consulted by hospitalist physician Dr. Tobie Poet in this context for evaluation and management of gallstones.  PAST MEDICAL HISTORY (PMH):  Past Medical History:  Diagnosis Date   Actinic keratosis    Basal cell carcinoma 10/21/2017   left lateral neck, excised 02/09/2018   Basal cell carcinoma (BCC) 05/12/2019   right posterior ear, excised 05/31/2019   Blood transfusion without reported diagnosis    Diabetes mellitus without complication ([place])      PAST SURGICAL HISTORY ([place]):  Past Surgical History:  Procedure Laterality Date   COLONOSCOPY WITH PROPOFOL N/A 04/29/2019   Procedure: COLONOSCOPY WITH PROPOFOL;  Surgeon: Jonathon Bellows, MD;  Location: [facility] ENDOSCOPY;  Service: Gastroenterology;  Laterality: N/A;   ESOPHAGOGASTRODUODENOSCOPY (EGD) WITH PROPOFOL N/A 04/29/2019   Procedure: ESOPHAGOGASTRODUODENOSCOPY (EGD) WITH PROPOFOL;   Surgeon: Jonathon Bellows, MD;  Location: [facility] ENDOSCOPY;  Service: Gastroenterology;  Laterality: N/A;     MEDICATIONS:  Prior to Admission medications   Medication Sig Start Date End Date Taking? Authorizing Provider  carvedilol (COREG) 3.125 MG tablet Take 1 tablet (3.125 mg total) by mouth 2 (two) times daily with a meal. 11/25/20  Yes Wieting, Richard, MD  hydrALAZINE (APRESOLINE) 50 MG tablet Take 1 tablet (50 mg total) by mouth 2 (two) times daily. 11/25/20  Yes Wieting, Richard, MD  isosorbide mononitrate (IMDUR) 30 MG 24 hr tablet Take 30 mg by mouth every evening.  03/14/19  Yes [provider]  Multiple Vitamin (MULTIVITAMIN) capsule Take 1 capsule by mouth daily.   Yes [provider]  olmesartan-hydrochlorothiazide (BENICAR HCT) 40-25 MG tablet Take 1 tablet by mouth daily. 01/21/21  Yes [provider]  terazosin (HYTRIN) 10 MG capsule Take 10 mg by mouth every evening.  03/13/19  Yes [provider]  vitamin B-12 (CYANOCOBALAMIN) 1000 MCG tablet Take 1 tablet (1,000 mcg total) by mouth daily. 04/29/19  Yes Wieting, Richard, MD  olmesartan (BENICAR) 40 MG tablet Take 1 tablet (40 mg total) by mouth daily. Patient not taking: Reported on 01/23/2021 11/25/20   Loletha Grayer, MD  pantoprazole (PROTONIX) 40 MG tablet Take 1 tablet (40 mg total) by mouth daily. 11/25/20 12/25/20  Loletha Grayer, MD     ALLERGIES:  Allergies  Allergen Reactions   Aspirin Other (See Comments)    GI bleed   Codeine Nausea Only and Nausea And Vomiting     SOCIAL HISTORY:  Social History   Socioeconomic History   Marital status: Married  Spouse name: Not on file   Number of children: Not on file   Years of education: Not on file   Highest education level: Not on file  Occupational History   Not on file  Tobacco Use   Smoking status: Former   Smokeless tobacco: Never  Vaping Use   Vaping Use: Never used  Substance and Sexual Activity   Alcohol use: Not  Currently    Comment: rare once a year   Drug use: Never   Sexual activity: Not on file  Other Topics Concern   Not on file  Social History Narrative   Not on file   Social Determinants of Health   Financial Resource Strain: Not on file  Food Insecurity: Not on file  Transportation Needs: Not on file  Physical Activity: Not on file  Stress: Not on file  Social Connections: Not on file  Intimate Partner Violence: Not on file     FAMILY HISTORY:  History reviewed. No pertinent family history.    REVIEW OF SYSTEMS:  Constitutional: + weight change, no fever ENT/Mouth: no sore throat, no rhinorrhea Eyes: no eye pain, no vision changes Cardiovascular: no chest pain, no dyspnea,  no edema, no palpitations Respiratory: no cough, no sputum, no wheezing Gastrointestinal: no nausea, no vomiting, + diarrhea, no constipation Genitourinary: no urinary incontinence, no dysuria, no hematuria Musculoskeletal: no arthralgias, no myalgias Skin: no skin lesions, no pruritus, Neuro: + weakness, no loss of consciousness, no syncope Psych: no anxiety, no depression, + decrease appetite Heme/Lymph: no bruising, no bleeding  VITAL SIGNS:  Temp:  [97.7 F (36.5 C)-98.4 F (36.9 C)] 97.7 F (36.5 C) (10/19 1933) Pulse Rate:  [60-69] 62 (10/19 1933) Resp:  [6-20] 16 (10/19 1933) BP: (80-121)/(31-49) 118/48 (10/19 1933) SpO2:  [94 %-100 %] 100 % (10/19 1933) Weight:  [97.5 kg] 97.5 kg (10/19 1304)     Height: 6\' 6"  (198.1 cm) Weight: 97.5 kg BMI (Calculated): 24.85   INTAKE/OUTPUT:  No intake/output data recorded.  PHYSICAL EXAM:  Physical Exam Blood pressure (!) 118/48, pulse 62, temperature 97.7 F (36.5 C), temperature source Oral, resp. rate 16, height 6\' 6"  (1.981 m), weight 97.5 kg, SpO2 100 %. Last Weight  Most recent update: 01/23/2021  1:05 PM    Weight  97.5 kg (215 lb)             CONSTITUTIONAL: Well developed, appropriately responsive and aware without distress.    EYES: Sclera non-icteric.   EARS, NOSE, MOUTH AND THROAT:  The oropharynx is clear. Oral mucosa is pink and moist.    Hearing is intact to voice.  NECK: Trachea is midline, and there is no jugular venous distension.  LYMPH NODES:  Lymph nodes in the neck are not enlarged. RESPIRATORY:  Lungs are clear, and breath sounds are equal bilaterally. Normal respiratory effort without pathologic use of accessory muscles. CARDIOVASCULAR: Heart is regular in rate and rhythm. GI: The abdomen is soft, nontender, and nondistended. There were no palpable masses. I did not appreciate hepatosplenomegaly. There were normal bowel sounds. MUSCULOSKELETAL:  Symmetrical muscle tone appreciated in all four extremities.    SKIN: Skin turgor is normal. No pathologic skin lesions appreciated.  NEUROLOGIC:  Motor and sensation appear grossly normal.  Cranial nerves are grossly without defect. PSYCH:  Alert and oriented to person, place and time. Affect is appropriate for situation.  Data Reviewed I have personally reviewed what is currently available of the patient's imaging, recent labs and medical records.  Labs:  CBC Latest Ref Rng & Units 01/23/2021 12/28/2020 12/13/2020  WBC 4.0 - 10.5 K/uL 6.7 4.3 4.2  Hemoglobin 13.0 - 17.0 g/dL 9.6(L) 8.4(L) 7.9(L)  Hematocrit 39.0 - 52.0 % 30.4(L) 26.7(L) 24.3(L)  Platelets 150 - 400 K/uL 125(L) 168 154   CMP Latest Ref Rng & Units 01/23/2021 11/30/2020 11/25/2020  Glucose 70 - 99 mg/dL 121(H) 117(H) 124(H)  BUN 8 - 23 mg/dL 70(H) 61(H) 60(H)  Creatinine 0.61 - 1.24 mg/dL 7.73(H) 4.69(H) 4.90(H)  Sodium 135 - 145 mmol/L 134(L) 139 141  Potassium 3.5 - 5.1 mmol/L 4.0 4.1 4.4  Chloride 98 - 111 mmol/L 102 113(H) 112(H)  CO2 22 - 32 mmol/L 19(L) 20(L) 24  Calcium 8.9 - 10.3 mg/dL 8.0(L) 8.0(L) 8.6(L)  Total Protein 6.5 - 8.1 g/dL 6.2(L) 5.3(L) -  Total Bilirubin 0.3 - 1.2 mg/dL 0.8 0.3 -  Alkaline Phos 38 - 126 U/L 32(L) 20(L) -  AST 15 - 41 U/L 682(H) 20 -  ALT 0 - 44  U/L 189(H) 14 -     Imaging studies:   Last 24 hrs: CT ABDOMEN PELVIS WO CONTRAST  Result Date: 01/23/2021 CLINICAL DATA:  Abdominal pain, acute, nonlocalized. Generalized weakness. Hypotension. EXAM: CT ABDOMEN AND PELVIS WITHOUT CONTRAST TECHNIQUE: Multidetector CT imaging of the abdomen and pelvis was performed following the standard protocol without IV contrast. COMPARISON:  08/09/2012 FINDINGS: Lower chest: Linear scarring or atelectasis at the right lung base. Otherwise no active lower chest finding. Hepatobiliary: Liver appears normal without contrast. 1 cm calcified stone in the gallbladder neck without definite CT evidence of cholecystitis. Consider right upper quadrant ultrasound if concern persists regarding possible clinical cholecystitis. Pancreas: Normal Spleen: Normal Adrenals/Urinary Tract: Adrenal glands are normal. Multiple bilateral renal cysts. Nonobstructing 6 mm stone in the lower pole of the left kidney. No passing stone or hydronephrosis. No stone in the bladder. Stomach/Bowel: Stomach and small intestine are normal. No evidence of diverticulosis or diverticulitis. Vascular/Lymphatic: Aortic atherosclerosis. No aneurysm. IVC is normal. No retroperitoneal adenopathy. Reproductive: Enlarged prostate. Other: Tiny amount of free fluid in the pelvic cul-de-sac. No free air. Musculoskeletal: Ordinary lower lumbar degenerative changes. IMPRESSION: 1 cm gallstone in the gallbladder neck. No CT evidence of cholecystitis. Consider right upper quadrant ultrasound if there is clinical concern regarding potential cholecystitis. Aortic Atherosclerosis (ICD10-I70.0). Multiple renal cysts. 6 mm nonobstructing stone in the lower pole of the left kidney. Enlarged prostate. Electronically Signed   By: Nelson Chimes M.D.   On: 01/23/2021 14:58   DG Chest Port 1 View  Result Date: 01/23/2021 CLINICAL DATA:  Weakness Increase lactic acid EXAM: PORTABLE CHEST 1 VIEW COMPARISON:  None. FINDINGS: Heart  size within normal limits. No pulmonary vascular congestion. Right hemidiaphragm is elevated. Lungs are clear. IMPRESSION: No acute cardiopulmonary process. Electronically Signed   By: Miachel Roux M.D.   On: 01/23/2021 16:32   US Abdomen Limited RUQ (LIVER/GB)  Result Date: 01/23/2021 CLINICAL DATA:  Right upper quadrant pain EXAM: ULTRASOUND ABDOMEN LIMITED RIGHT UPPER QUADRANT COMPARISON:  CT 01/23/2021 FINDINGS: Gallbladder: Sludge within the gallbladder. 16 mm non mobile stone at the gallbladder neck. Slight increased wall thickness of 3.7 mm but negative sonographic Murphy. Common bile duct: Diameter: 5.2 mm Liver: No focal lesion identified. Within normal limits in parenchymal echogenicity. Portal vein is patent on color Doppler imaging with normal direction of blood flow towards the liver. Other: Right kidney cortex appears slightly echogenic. IMPRESSION: 1. Sludge in the gallbladder with non mobile stone at the  gallbladder neck with slight increased wall thickness but negative sonographic Murphy. Findings are indeterminate for cholecystitis, consider correlation with nuclear medicine hepatobiliary imaging 2. Right kidney cortex appears slightly echogenic suggesting medical renal disease, correlate with appropriate laboratory values Electronically Signed   By: Donavan Foil M.D.   On: 01/23/2021 15:45     Assessment/Plan:  80 y.o. male with weakness, weight loss, elevated transaminases, nonmobile gallstone in the neck of the gallbladder, likely consistent with subacute cholecystitis presenting with acute on chronic renal injury and anemia of chronic disease.  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   Anemia due to vitamin B12 deficiency 05/05/2019   Benign prostatic hyperplasia 05/05/2019   Acute lower GI bleeding 04/27/2019   Acute blood loss anemia 04/27/2019   Acute kidney injury superimposed on CKD ([place]) 04/27/2019   Iron deficiency anemia due to chronic blood loss 04/27/2019   Type 2 diabetes mellitus with stage 4 chronic kidney disease, without long-term current use of insulin ([place]) 10/16/2015   Essential hypertension 10/16/2015   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  -Consider HIDA scan without need for ejection fraction to evaluate/confirm cystic duct obstruction and hydrops/acute cholecystitis.  His syndrome may all revolve around a progression of his cholecystitis.  Despite his relative lack of classic/specific symptoms.    -Concur with hospitalist admission and their current plan to support his acute renal injury.  - DVT prophylaxis We will follow-up with you. Briefly discussed with patient possibility for cholecystectomy on Friday if warranted. Considering the chronicity of this disease it may be wise to consider percutaneous drainage, deferring definitive cholecystectomy until current syndrome has improved, i.e. renal function and hepatic function. All of the above findings and recommendations were discussed with the patient and  family(if present), and all of patient's and present family's questions were answered to their expressed satisfaction.  Thank you for the  opportunity to participate in this patient's care.   -- Ronny Bacon, M.D., FACS 01/23/2021, 9:30 PM

## 2021-01-23 NOTE — Consult Note (Signed)
PHARMACY -  BRIEF ANTIBIOTIC NOTE   Pharmacy has received consult(s) for cefepime from an ED provider.  The patient's profile has been reviewed for ht/wt/allergies/indication/available labs.    One time order(s) placed for cefepime 2g IV x1  Further antibiotics/pharmacy consults should be ordered by admitting physician if indicated.                       Thank you, Narda Rutherford, PharmD Pharmacy Resident  01/23/2021 3:47 PM

## 2021-01-23 NOTE — ED Notes (Signed)
Secure msg sent to Reola Mosher., RN for ED to IP SBAR.

## 2021-01-23 NOTE — Telephone Encounter (Signed)
Incoming call from patient wife requesting that patient be seen TODAY stating that he is like he was when he was the hospital. He is too weak to get out of bed and is not eating. He is not short of breath, he is sleeping all the time and she states he needs blood and iron. She states this has been progressive for the past 2 days. Please advise

## 2021-01-23 NOTE — H&P (Signed)
History and Physical   Gregory Dixon ENI:[id number] DOB: 1940/08/03 DOA: 01/23/2021  PCP: Valera Castle, MD  Outpatient Specialists: Dr. Grayland Ormond, hematologist Patient coming from: Home  I have personally briefly reviewed patient's old medical records in [facility].  Chief Concern: Weakness  HPI: Gregory Dixon is a 80 y.o. male with medical history significant for AKI on CKD stage IV, IDA, B12 deficiency, hypertension, hyperlipidemia, who presents emergency department from home for chief concerns of weakness.  Patient presented to the emergency department due to 3 days of weakness unable to ambulate or get up.  He endorses 3 to 4 days of chills.  He denies known sick contacts.  He reports he feels similar to previous episodes of anemia requiring blood transfusion.  He denies chest pain, shortness of breath, abdominal pain, dysuria, hematuria.  He does endorse chronic diarrhea having 2-3 watery bowel movements per day.  This has been chronic for 20 years.  Social history: Lives at home with his wife.  He is a former tobacco user.  He denies EtOH and recreational drug use.  ROS: Constitutional: + weight change, no fever ENT/Mouth: no sore throat, no rhinorrhea Eyes: no eye pain, no vision changes Cardiovascular: no chest pain, no dyspnea,  no edema, no palpitations Respiratory: no cough, no sputum, no wheezing Gastrointestinal: no nausea, no vomiting, + diarrhea, no constipation Genitourinary: no urinary incontinence, no dysuria, no hematuria Musculoskeletal: no arthralgias, no myalgias Skin: no skin lesions, no pruritus, Neuro: + weakness, no loss of consciousness, no syncope Psych: no anxiety, no depression, + decrease appetite Heme/Lymph: no bruising, no bleeding  ED Course: Discussed with emergency medicine provider, patient requiring hospitalization for chief concerns of transaminitis/acute hepatitis.  Vitals in the emergency department was remarkable for  temperature of 98.4, respiration rate of 20, heart rate of 65, initial blood pressure 98/44 and improved to 108/49, SPO2 of 98% on room air.  Labs in the emergency department was remarkable for sodium 134, potassium 4.0, chloride 102, bicarb 19, BUN of 70, serum creatinine of 7.73, nonfasting blood glucose 121, WBC 6.7, hemoglobin 9.8, platelets of 125.  eGFR was 7.  Lactic acid was elevated at 2.7.  In the emergency department patient was given Flagyl, sodium chloride 2 L bolus, cefepime per pharmacy.  CT abdomen pelvis without contrast was read as 1 cm gallstone in the gallbladder neck.  No CT evidence of cholecystitis.  Aortic atherosclerosis.  Multiple renal cysts.  6 mm nonobstructing stone in the lower pole of the left kidney.  Assessment/Plan  Principal Problem:   Cholelithiasis Active Problems:   Benign prostatic hyperplasia   Chronic kidney disease   Diabetic peripheral neuropathy associated with type 2 diabetes mellitus (HCC)   Hyperlipidemia   Anemia associated with chronic renal failure   Weakness   Elevated LFTs   Transaminitis   # Elevated LFTs-suspect secondary to cholecystitis # Transaminitis - Ultrasound of the abdomen - General surgery has been consulted and we appreciate further recommendations - Check acute hepatitis panel, hepatitis B and C antibody - Cefepime and metronidazole - Liver function tests ordered for the a.m.  # Acute kidney injury on CKD stage IV/V - Nephrology has been consulted for Epogen consideration  # Generalized weakness-check B12, phosphorus, magnesium, TSH  # History of hypertension-low normotensive on presentation - Hydralazine 50 mg twice daily, olmesartan 40 mg, Coreg, losartan has been held at this time due to borderline normotensive/hypotension  # 15 pound weight loss-patient states that since being discharged from the  hospital in August 2022, patient has not had any appetite.  Food has no taste to him.  He did not lose his sense  of smell. - Outpatient follow-up/work-up  # Anemia of chronic kidney disease and IDA-hemoglobin is 9.8 on presentation  # History of MGUS-continue follow-up with hematologist outpatient  Chart reviewed.   Hospitalization from 11/23/2020 to an 11/25/2020: Patient was admitted for severe anemia with hemoglobin on admission of 5.7 and received a total of 4 units of blood PRBC.  IV iron was also given.  Hemoglobin upon discharge was 7.9.  DVT prophylaxis: Heparin 5000 units Code Status: DNR/DNI Diet: Heart healthy, n.p.o. after midnight Family Communication: Updated spouse at bedside Mrs. Finis Bud Disposition Plan: Pending clinical course Consults called: General surgery Admission status: MedSurg, observation, telemetry  Past Medical History:  Diagnosis Date   Actinic keratosis    Basal cell carcinoma 10/21/2017   left lateral neck, excised 02/09/2018   Basal cell carcinoma (BCC) 05/12/2019   right posterior ear, excised 05/31/2019   Blood transfusion without reported diagnosis    Diabetes mellitus without complication [facility])    Past Surgical History:  Procedure Laterality Date   COLONOSCOPY WITH PROPOFOL N/A 04/29/2019   Procedure: COLONOSCOPY WITH PROPOFOL;  Surgeon: Jonathon Bellows, MD;  Location: [facility] ENDOSCOPY;  Service: Gastroenterology;  Laterality: N/A;   ESOPHAGOGASTRODUODENOSCOPY (EGD) WITH PROPOFOL N/A 04/29/2019   Procedure: ESOPHAGOGASTRODUODENOSCOPY (EGD) WITH PROPOFOL;  Surgeon: Jonathon Bellows, MD;  Location[facility] ENDOSCOPY;  Service: Gastroenterology;  Laterality: N/A;   Social History:  reports that he has quit smoking. He has never used smokeless tobacco. He reports that he does not currently use alcohol. He reports that he does not use drugs.  Allergies  Allergen Reactions   Aspirin Other (See Comments)    GI bleed   Codeine Nausea Only and Nausea And Vomiting   No family history on file. Family history: Family history reviewed and not pertinent  Prior to Admission  medications   Medication Sig Start Date End Date Taking? Authorizing Provider  carvedilol (COREG) 3.125 MG tablet Take 1 tablet (3.125 mg total) by mouth 2 (two) times daily with a meal. 11/25/20   Loletha Grayer, MD  hydrALAZINE (APRESOLINE) 50 MG tablet Take 1 tablet (50 mg total) by mouth 2 (two) times daily. 11/25/20   Loletha Grayer, MD  isosorbide mononitrate (IMDUR) 30 MG 24 hr tablet Take 30 mg by mouth every evening.  03/14/19   [provider]  Multiple Vitamin (MULTIVITAMIN) capsule Take 1 capsule by mouth daily.    [provider]  olmesartan (BENICAR) 40 MG tablet Take 1 tablet (40 mg total) by mouth daily. 11/25/20   Loletha Grayer, MD  olmesartan-hydrochlorothiazide (BENICAR HCT) 40-25 MG tablet Take 1 tablet by mouth daily. 01/21/21   [provider]  pantoprazole (PROTONIX) 40 MG tablet Take 1 tablet (40 mg total) by mouth daily. 11/25/20 12/25/20  Loletha Grayer, MD  terazosin (HYTRIN) 10 MG capsule Take 10 mg by mouth every evening.  03/13/19   [provider]  vitamin B-12 (CYANOCOBALAMIN) 1000 MCG tablet Take 1 tablet (1,000 mcg total) by mouth daily. 04/29/19   Loletha Grayer, MD   Physical Exam: Vitals:   01/23/21 1336 01/23/21 1400 01/23/21 1904 01/23/21 1933  BP:  (!) 121/48 (!) 115/41 (!) 118/48  Pulse: 60 68 69 62  Resp: 20 14 (!) 6 16  Temp:   98.1 F (36.7 C) 97.7 F (36.5 C)  TempSrc:    Oral  SpO2: 96%  97% 99% 100%  Weight:      Height:       Constitutional: appears age-appropriate, frail, NAD, calm, comfortable Eyes: PERRL, lids and conjunctivae normal ENMT: Mucous membranes are moist. Posterior pharynx clear of any exudate or lesions. Age-appropriate dentition. Hearing appropriate Neck: normal, supple, no masses, no thyromegaly Respiratory: clear to auscultation bilaterally, no wheezing, no crackles. Normal respiratory effort. No accessory muscle use.  Cardiovascular: Regular rate and rhythm, no murmurs / rubs /  gallops. No extremity edema. 2+ pedal pulses. No carotid bruits.  Abdomen: Obese abdomen, no tenderness, no masses palpated, no hepatosplenomegaly. Bowel sounds positive.  Musculoskeletal: no clubbing / cyanosis. No joint deformity upper and lower extremities. Good ROM, no contractures, no atrophy. Normal muscle tone.  Skin: no rashes, lesions, ulcers. No induration.  Pale Neurologic: Sensation intact. Strength 5/5 in all 4.  Psychiatric: Normal judgment and insight. Alert and oriented x 3. Normal mood.   EKG: Ordered  Chest x-ray on Admission: I personally reviewed and I agree with radiologist reading as below.  CT ABDOMEN PELVIS WO CONTRAST  Result Date: 01/23/2021 CLINICAL DATA:  Abdominal pain, acute, nonlocalized. Generalized weakness. Hypotension. EXAM: CT ABDOMEN AND PELVIS WITHOUT CONTRAST TECHNIQUE: Multidetector CT imaging of the abdomen and pelvis was performed following the standard protocol without IV contrast. COMPARISON:  08/09/2012 FINDINGS: Lower chest: Linear scarring or atelectasis at the right lung base. Otherwise no active lower chest finding. Hepatobiliary: Liver appears normal without contrast. 1 cm calcified stone in the gallbladder neck without definite CT evidence of cholecystitis. Consider right upper quadrant ultrasound if concern persists regarding possible clinical cholecystitis. Pancreas: Normal Spleen: Normal Adrenals/Urinary Tract: Adrenal glands are normal. Multiple bilateral renal cysts. Nonobstructing 6 mm stone in the lower pole of the left kidney. No passing stone or hydronephrosis. No stone in the bladder. Stomach/Bowel: Stomach and small intestine are normal. No evidence of diverticulosis or diverticulitis. Vascular/Lymphatic: Aortic atherosclerosis. No aneurysm. IVC is normal. No retroperitoneal adenopathy. Reproductive: Enlarged prostate. Other: Tiny amount of free fluid in the pelvic cul-de-sac. No free air. Musculoskeletal: Ordinary lower lumbar degenerative  changes. IMPRESSION: 1 cm gallstone in the gallbladder neck. No CT evidence of cholecystitis. Consider right upper quadrant ultrasound if there is clinical concern regarding potential cholecystitis. Aortic Atherosclerosis (ICD10-I70.0). Multiple renal cysts. 6 mm nonobstructing stone in the lower pole of the left kidney. Enlarged prostate. Electronically Signed   By: Nelson Chimes M.D.   On: 01/23/2021 14:58   DG Chest Port 1 View  Result Date: 01/23/2021 CLINICAL DATA:  Weakness Increase lactic acid EXAM: PORTABLE CHEST 1 VIEW COMPARISON:  None. FINDINGS: Heart size within normal limits. No pulmonary vascular congestion. Right hemidiaphragm is elevated. Lungs are clear. IMPRESSION: No acute cardiopulmonary process. Electronically Signed   By: Miachel Roux M.D.   On: 01/23/2021 16:32   US Abdomen Limited RUQ (LIVER/GB)  Result Date: 01/23/2021 CLINICAL DATA:  Right upper quadrant pain EXAM: ULTRASOUND ABDOMEN LIMITED RIGHT UPPER QUADRANT COMPARISON:  CT 01/23/2021 FINDINGS: Gallbladder: Sludge within the gallbladder. 16 mm non mobile stone at the gallbladder neck. Slight increased wall thickness of 3.7 mm but negative sonographic Murphy. Common bile duct: Diameter: 5.2 mm Liver: No focal lesion identified. Within normal limits in parenchymal echogenicity. Portal vein is patent on color Doppler imaging with normal direction of blood flow towards the liver. Other: Right kidney cortex appears slightly echogenic. IMPRESSION: 1. Sludge in the gallbladder with non mobile stone at the gallbladder neck with slight increased wall thickness but negative sonographic  Percell Miller. Findings are indeterminate for cholecystitis, consider correlation with nuclear medicine hepatobiliary imaging 2. Right kidney cortex appears slightly echogenic suggesting medical renal disease, correlate with appropriate laboratory values Electronically Signed   By: Donavan Foil M.D.   On: 01/23/2021 15:45    Labs on Admission: I have  personally reviewed following labs  CBC: Recent Labs  Lab 01/23/21 1058  WBC 6.7  NEUTROABS 6.0  HGB 9.6*  HCT 30.4*  MCV 90.2  PLT 517*   Basic Metabolic Panel: Recent Labs  Lab 01/23/21 1058  NA 134*  K 4.0  CL 102  CO2 19*  GLUCOSE 121*  BUN 70*  CREATININE 7.73*  CALCIUM 8.0*   GFR: Estimated Creatinine Clearance: 10 mL/min (A) (by C-G formula based on SCr of 7.73 mg/dL (H)).  Liver Function Tests: Recent Labs  Lab 01/23/21 1058  AST 682*  ALT 189*  ALKPHOS 32*  BILITOT 0.8  PROT 6.2*  ALBUMIN 2.8*   Anemia Panel: Recent Labs    01/23/21 1058  FERRITIN 1,157*  TIBC 241*  IRON 11*   Urine analysis:    Component Value Date/Time   COLORURINE YELLOW (A) 04/27/2019 2100   APPEARANCEUR Clear 06/28/2020 0900   LABSPEC 1.011 04/27/2019 2100   PHURINE 6.0 04/27/2019 2100   GLUCOSEU Trace (A) 06/28/2020 0900   HGBUR NEGATIVE 04/27/2019 2100   BILIRUBINUR Negative 06/28/2020 0900   KETONESUR NEGATIVE 04/27/2019 2100   PROTEINUR 3+ (A) 06/28/2020 0900   PROTEINUR >=300 (A) 04/27/2019 2100   NITRITE Negative 06/28/2020 0900   NITRITE NEGATIVE 04/27/2019 2100   LEUKOCYTESUR Negative 06/28/2020 0900   LEUKOCYTESUR NEGATIVE 04/27/2019 2100   Dr. Tobie Poet [facility]  If 7PM-7AM, please contact overnight-coverage provider If 7AM-7PM, please contact day coverage provider [email or web address]  01/23/2021, 7:55 PM

## 2021-01-23 NOTE — ED Provider Notes (Signed)
Emergency Medicine Provider Triage Evaluation Note  Gregory Dixon, a 80 y.o. male  was evaluated in triage.  Pt complains of generalized weakness and low blood pressure.  Patient presents directly to the ED from the cancer center, was found to have a significant abnormality to his BUN, creatinine, LFTs.  Patient gets iron infusions regularly at the cancer center.  He would endorse some generalized weakness, and noted some bloody urine last week.  He denies any cough, congestion, or fevers.  Review of Systems  Positive: Weakness, abnl labs,hematuria Negative: FCS  Physical Exam  BP (!) 80/31   Pulse 61   Temp 98.4 F (36.9 C) (Oral)   Resp 20   Ht 6\' 6"  (1.981 m)   Wt 97.5 kg   SpO2 94%   BMI 24.85 kg/m  Gen:   Awake, no distress  NAD Resp:  Normal effort CTA MSK:   Moves extremities without difficulty  Other:  CVS: hypotension, normal heart sounds  Medical Decision Making  Medically screening exam initiated at 1:07 PM.  Appropriate orders placed.  Gregory Dixon was informed that the remainder of the evaluation will be completed by another provider, this initial triage assessment does not replace that evaluation, and the importance of remaining in the ED until their evaluation is complete.  Patient coming from the cancer center with abnormal labs and reports of generalized weakness and hematuria.   Melvenia Needles, PA-C 01/23/21 1309    Duffy Bruce, MD 01/23/21 1530

## 2021-01-23 NOTE — ED Notes (Signed)
2nd secure msg sent to Reola Mosher., RN

## 2021-01-23 NOTE — ED Triage Notes (Signed)
First RN  note:    Pt comes into the ED via cancer center c/o abnormal labs with elevated BUN creatinine and new onset of high liver enzymes.

## 2021-01-23 NOTE — Progress Notes (Signed)
Pt feels that he is in need of a blood transfusion. Reports fatigue, weakness, slight dizziness, nausea, and poor appetite for about 3 days.   Pt transported to ED for further evaluation as requested by Billey Chang, NP. Report given to triage RN.

## 2021-01-23 NOTE — ED Notes (Signed)
Lactic acid sent to lab 

## 2021-01-23 NOTE — Progress Notes (Signed)
[facility]pt[facility] [place]  [phone] [phone]  Patient Care Team: Valera Castle, MD as PCP - General (Family Medicine) Lloyd Huger, MD as Consulting Physician (Oncology)   Name of the patient: Lois Ostrom  [id number]  [birth year]   Date of visit: 01/23/21  Reason for Consult:  Mr. Alim Cattell is a 80 year old male with multiple medical problems including CKD stage III, iron deficiency anemia, vitamin B12 deficiency, hypertension, hyperlipidemia and diabetes.  Patient is followed at the cancer center by Dr. Grayland Ormond for anemia associated with chronic renal failure and iron deficiency anemia.  He has previously received Retacrit (last dose 05/14/2020) and Venofer.  Patient last saw Dr. Grayland Ormond on 12/13/2020 at which time he was asymptomatic but with with Hg of 7.9. Patient received IV Venofer x 4 doses (last on 12/20/2020).  Patient presents acutely to [facility] today for evaluation of feelings of dizziness and weakness.  He also endorses fever and chills starting over the past 4 days. He had nausea and vomiting yesterday but denies being symptomatic today.  He also endorses an episode of hematuria a couple days ago but this has not recurred since.  No dysuria but patient does describe feeling like he has to void more frequently than usual.  No back or suprapubic pain.  No melena or hematochezia.  No diarrhea or constipation.  Denies any neurologic complaints. Denies any easy bleeding or bruising. Reports good appetite and denies weight loss. Denies chest pain. Patient offers no further specific complaints today.  PAST MEDICAL HISTORY: Past Medical History:  Diagnosis Date   Actinic keratosis    Basal cell carcinoma 10/21/2017   left lateral neck, excised 02/09/2018   Basal cell carcinoma (BCC) 05/12/2019   right posterior ear, excised 05/31/2019   Blood transfusion without reported diagnosis    Diabetes mellitus  without complication ([place])     PAST SURGICAL HISTORY:  Past Surgical History:  Procedure Laterality Date   COLONOSCOPY WITH PROPOFOL N/A 04/29/2019   Procedure: COLONOSCOPY WITH PROPOFOL;  Surgeon: Jonathon Bellows, MD;  Location: [facility] ENDOSCOPY;  Service: Gastroenterology;  Laterality: N/A;   ESOPHAGOGASTRODUODENOSCOPY (EGD) WITH PROPOFOL N/A 04/29/2019   Procedure: ESOPHAGOGASTRODUODENOSCOPY (EGD) WITH PROPOFOL;  Surgeon: Jonathon Bellows, MD;  Location: [facility] ENDOSCOPY;  Service: Gastroenterology;  Laterality: N/A;    HEMATOLOGY/ONCOLOGY HISTORY:  Oncology History   No history exists.    ALLERGIES:  is allergic to aspirin and codeine.  MEDICATIONS:  Current Outpatient Medications  Medication Sig Dispense Refill   carvedilol (COREG) 3.125 MG tablet Take 1 tablet (3.125 mg total) by mouth 2 (two) times daily with a meal. 60 tablet 0   hydrALAZINE (APRESOLINE) 50 MG tablet Take 1 tablet (50 mg total) by mouth 2 (two) times daily. 60 tablet 0   isosorbide mononitrate (IMDUR) 30 MG 24 hr tablet Take 30 mg by mouth every evening.      Multiple Vitamin (MULTIVITAMIN) capsule Take 1 capsule by mouth daily.     olmesartan (BENICAR) 40 MG tablet Take 1 tablet (40 mg total) by mouth daily. 30 tablet 0   terazosin (HYTRIN) 10 MG capsule Take 10 mg by mouth every evening.      vitamin B-12 (CYANOCOBALAMIN) 1000 MCG tablet Take 1 tablet (1,000 mcg total) by mouth daily. 30 tablet 0   pantoprazole (PROTONIX) 40 MG tablet Take 1 tablet (40 mg total) by mouth daily. 30 tablet 0   No current facility-administered medications for this visit.    VITAL SIGNS:  BP (!) 98/44 Comment (BP Location): manual  Pulse 65   Temp 98.3 F (36.8 C) (Tympanic)   Resp 18   SpO2 98%  There were no vitals filed for this visit.  Estimated body mass index is 28.44 kg/m as calculated from the following:   Height as of 11/30/20: 6\' 6"  (1.981 m).   Weight as of 12/28/20: 246 lb 1.6 oz (111.6 kg).  LABS: CBC:    Component  Value Date/Time   WBC 6.7 01/23/2021 1058   HGB 9.6 (L) 01/23/2021 1058   HGB 12.9 (L) 12/24/2012 1013   HCT 30.4 (L) 01/23/2021 1058   HCT 17.0 (LL) 11/23/2020 1003   PLT 125 (L) 01/23/2021 1058   PLT 125 (L) 12/24/2012 1013   MCV 90.2 01/23/2021 1058   MCV 82 12/24/2012 1013   NEUTROABS 6.0 01/23/2021 1058   NEUTROABS 2.5 12/24/2012 1013   LYMPHSABS 0.3 (L) 01/23/2021 1058   LYMPHSABS 0.8 (L) 12/24/2012 1013   MONOABS 0.3 01/23/2021 1058   MONOABS 0.2 12/24/2012 1013   EOSABS 0.0 01/23/2021 1058   EOSABS 0.1 12/24/2012 1013   BASOSABS 0.0 01/23/2021 1058   BASOSABS 0.0 12/24/2012 1013   Comprehensive Metabolic Panel:    Component Value Date/Time   NA 134 (L) 01/23/2021 1058   NA 139 02/28/2012 0505   K 4.0 01/23/2021 1058   K 4.1 02/28/2012 0505   CL 102 01/23/2021 1058   CL 109 (H) 02/28/2012 0505   CO2 19 (L) 01/23/2021 1058   CO2 23 02/28/2012 0505   BUN 70 (H) 01/23/2021 1058   BUN 20 (H) 02/28/2012 0505   CREATININE 7.73 (H) 01/23/2021 1058   CREATININE 1.85 (H) 02/28/2012 0505   GLUCOSE 121 (H) 01/23/2021 1058   GLUCOSE 153 (H) 02/28/2012 0505   CALCIUM 8.0 (L) 01/23/2021 1058   CALCIUM 8.3 (L) 02/28/2012 0505   AST 682 (H) 01/23/2021 1058   AST 16 02/27/2012 1645   ALT 189 (H) 01/23/2021 1058   ALT 20 02/27/2012 1645   ALKPHOS 32 (L) 01/23/2021 1058   ALKPHOS 22 (L) 02/27/2012 1645   BILITOT 0.8 01/23/2021 1058   BILITOT 0.2 02/27/2012 1645   PROT 6.2 (L) 01/23/2021 1058   PROT 6.0 (L) 02/27/2012 1645   ALBUMIN 2.8 (L) 01/23/2021 1058   ALBUMIN 3.3 (L) 02/27/2012 1645    RADIOGRAPHIC STUDIES: No results found.  PERFORMANCE STATUS (ECOG) : 2 - Symptomatic, <50% confined to bed  Review of Systems Unless otherwise noted, a complete review of systems is negative.  Physical Exam General: NAD Cardiovascular: regular rate and rhythm Pulmonary: clear anterior/posterior fields Abdomen: soft, nontender, + bowel sounds GU: no suprapubic  tenderness Extremities: no edema, no joint deformities Skin: no rashes Neurological: Weakness but otherwise nonfocal  Assessment and Plan- Patient is a 80 y.o. male with multiple medical problems including CKD stage III, iron deficiency anemia, vitamin B12 deficiency, hypertension, hyperlipidemia and diabetes, who presents to [facility] today for evaluation of weakness and dizziness   Labs revealing for transaminitis and worsening CKD. Unclear etiology.  Discussed with Dr. Grayland Ormond and patient transferred to ER for further evaluation and treatment.  Case and plan discussed with Dr. Grayland Ormond.   Patient expressed understanding and was in agreement with this plan. He also understands that He can call clinic at any time with any questions, concerns, or complaints.   Thank you for allowing me to participate in the care of this very pleasant patient.   Time Total: 20 minutes  Visit  consisted of counseling and education dealing with the complex and emotionally intense issues of symptom management and palliative care in the setting of serious and potentially life-threatening illness.Greater than 50%  of this time was spent counseling and coordinating care related to the above assessment and plan.  Signed by: Altha Harm, PhD, NP-C

## 2021-01-23 NOTE — ED Notes (Signed)
Pt resting comfortably in bed, NAD. No needs verbalized at this time. Wife at [facility]. Bed low & locked; call light & Personal items within reach.

## 2021-01-23 NOTE — ED Triage Notes (Signed)
See first nurse note, pt reports abnormal labs (elevated BUN, creatinine, increased liver enzymes).  Pt gets iron infusions at cancer center Labs visible in epic   Also reports generalized weakness and chills x1 week

## 2021-01-23 NOTE — Plan of Care (Signed)

## 2021-01-23 NOTE — ED Notes (Signed)
Informed RN bed assigned 

## 2021-01-23 NOTE — ED Notes (Signed)
MD Ellender Hose made aware of this pt's Lactic of 2.7 at this time.

## 2021-01-23 NOTE — ED Notes (Signed)
COVID swab sent to lab.

## 2021-01-24 ENCOUNTER — Observation Stay: Payer: Medicare Other

## 2021-01-24 DIAGNOSIS — R7989 Other specified abnormal findings of blood chemistry: Secondary | ICD-10-CM

## 2021-01-24 DIAGNOSIS — K8010 Calculus of gallbladder with chronic cholecystitis without obstruction: Secondary | ICD-10-CM

## 2021-01-24 DIAGNOSIS — K8001 Calculus of gallbladder with acute cholecystitis with obstruction: Secondary | ICD-10-CM | POA: Diagnosis present

## 2021-01-24 DIAGNOSIS — N400 Enlarged prostate without lower urinary tract symptoms: Secondary | ICD-10-CM | POA: Diagnosis not present

## 2021-01-24 DIAGNOSIS — Z992 Dependence on renal dialysis: Secondary | ICD-10-CM | POA: Diagnosis not present

## 2021-01-24 DIAGNOSIS — D472 Monoclonal gammopathy: Secondary | ICD-10-CM | POA: Diagnosis present

## 2021-01-24 DIAGNOSIS — N186 End stage renal disease: Secondary | ICD-10-CM | POA: Diagnosis present

## 2021-01-24 DIAGNOSIS — D631 Anemia in chronic kidney disease: Secondary | ICD-10-CM | POA: Diagnosis present

## 2021-01-24 DIAGNOSIS — E872 Acidosis, unspecified: Secondary | ICD-10-CM | POA: Diagnosis present

## 2021-01-24 DIAGNOSIS — I120 Hypertensive chronic kidney disease with stage 5 chronic kidney disease or end stage renal disease: Secondary | ICD-10-CM | POA: Diagnosis present

## 2021-01-24 DIAGNOSIS — N179 Acute kidney failure, unspecified: Secondary | ICD-10-CM | POA: Diagnosis present

## 2021-01-24 DIAGNOSIS — I4819 Other persistent atrial fibrillation: Secondary | ICD-10-CM | POA: Diagnosis not present

## 2021-01-24 DIAGNOSIS — N189 Chronic kidney disease, unspecified: Secondary | ICD-10-CM | POA: Diagnosis not present

## 2021-01-24 DIAGNOSIS — D509 Iron deficiency anemia, unspecified: Secondary | ICD-10-CM

## 2021-01-24 DIAGNOSIS — N2581 Secondary hyperparathyroidism of renal origin: Secondary | ICD-10-CM | POA: Diagnosis present

## 2021-01-24 DIAGNOSIS — E1142 Type 2 diabetes mellitus with diabetic polyneuropathy: Secondary | ICD-10-CM

## 2021-01-24 DIAGNOSIS — K219 Gastro-esophageal reflux disease without esophagitis: Secondary | ICD-10-CM | POA: Diagnosis present

## 2021-01-24 DIAGNOSIS — K8020 Calculus of gallbladder without cholecystitis without obstruction: Secondary | ICD-10-CM | POA: Diagnosis not present

## 2021-01-24 DIAGNOSIS — E860 Dehydration: Secondary | ICD-10-CM | POA: Diagnosis present

## 2021-01-24 DIAGNOSIS — E1151 Type 2 diabetes mellitus with diabetic peripheral angiopathy without gangrene: Secondary | ICD-10-CM | POA: Diagnosis present

## 2021-01-24 DIAGNOSIS — Z66 Do not resuscitate: Secondary | ICD-10-CM | POA: Diagnosis present

## 2021-01-24 DIAGNOSIS — R634 Abnormal weight loss: Secondary | ICD-10-CM | POA: Diagnosis present

## 2021-01-24 DIAGNOSIS — E785 Hyperlipidemia, unspecified: Secondary | ICD-10-CM | POA: Diagnosis present

## 2021-01-24 DIAGNOSIS — N281 Cyst of kidney, acquired: Secondary | ICD-10-CM | POA: Diagnosis present

## 2021-01-24 DIAGNOSIS — K8000 Calculus of gallbladder with acute cholecystitis without obstruction: Secondary | ICD-10-CM | POA: Diagnosis not present

## 2021-01-24 DIAGNOSIS — K529 Noninfective gastroenteritis and colitis, unspecified: Secondary | ICD-10-CM | POA: Diagnosis present

## 2021-01-24 DIAGNOSIS — Z20822 Contact with and (suspected) exposure to covid-19: Secondary | ICD-10-CM | POA: Diagnosis present

## 2021-01-24 DIAGNOSIS — Z87891 Personal history of nicotine dependence: Secondary | ICD-10-CM | POA: Diagnosis not present

## 2021-01-24 DIAGNOSIS — I700 Atherosclerosis of aorta: Secondary | ICD-10-CM | POA: Diagnosis present

## 2021-01-24 DIAGNOSIS — K819 Cholecystitis, unspecified: Secondary | ICD-10-CM | POA: Diagnosis present

## 2021-01-24 DIAGNOSIS — I2510 Atherosclerotic heart disease of native coronary artery without angina pectoris: Secondary | ICD-10-CM | POA: Diagnosis present

## 2021-01-24 DIAGNOSIS — E1122 Type 2 diabetes mellitus with diabetic chronic kidney disease: Secondary | ICD-10-CM | POA: Diagnosis present

## 2021-01-24 LAB — CBC
HCT: 26.6 % — ABNORMAL LOW (ref 39.0–52.0)
Hemoglobin: 8.8 g/dL — ABNORMAL LOW (ref 13.0–17.0)
MCH: 29.3 pg (ref 26.0–34.0)
MCHC: 33.1 g/dL (ref 30.0–36.0)
MCV: 88.7 fL (ref 80.0–100.0)
Platelets: 128 10*3/uL — ABNORMAL LOW (ref 150–400)
RBC: 3 MIL/uL — ABNORMAL LOW (ref 4.22–5.81)
RDW: 14.6 % (ref 11.5–15.5)
WBC: 5.3 10*3/uL (ref 4.0–10.5)
nRBC: 0 % (ref 0.0–0.2)

## 2021-01-24 LAB — HEPATITIS PANEL, ACUTE
HCV Ab: NONREACTIVE
Hep A IgM: NONREACTIVE
Hep B C IgM: NONREACTIVE
Hepatitis B Surface Ag: NONREACTIVE

## 2021-01-24 LAB — BLOOD CULTURE ID PANEL (REFLEXED) - BCID2

## 2021-01-24 LAB — HEPATIC FUNCTION PANEL
ALT: 162 U/L — ABNORMAL HIGH (ref 0–44)
AST: 303 U/L — ABNORMAL HIGH (ref 15–41)
Albumin: 2.5 g/dL — ABNORMAL LOW (ref 3.5–5.0)
Alkaline Phosphatase: 31 U/L — ABNORMAL LOW (ref 38–126)
Bilirubin, Direct: 0.2 mg/dL (ref 0.0–0.2)
Indirect Bilirubin: 0.4 mg/dL (ref 0.3–0.9)
Total Bilirubin: 0.6 mg/dL (ref 0.3–1.2)
Total Protein: 5.6 g/dL — ABNORMAL LOW (ref 6.5–8.1)

## 2021-01-24 LAB — BASIC METABOLIC PANEL
Anion gap: 10 (ref 5–15)
BUN: 83 mg/dL — ABNORMAL HIGH (ref 8–23)
CO2: 18 mmol/L — ABNORMAL LOW (ref 22–32)
Calcium: 8.3 mg/dL — ABNORMAL LOW (ref 8.9–10.3)
Chloride: 109 mmol/L (ref 98–111)
Creatinine, Ser: 7.81 mg/dL — ABNORMAL HIGH (ref 0.61–1.24)
GFR, Estimated: 6 mL/min — ABNORMAL LOW (ref >60)
Glucose, Bld: 117 mg/dL — ABNORMAL HIGH (ref 70–99)
Potassium: 3.9 mmol/L (ref 3.5–5.1)
Sodium: 137 mmol/L (ref 135–145)

## 2021-01-24 LAB — VITAMIN B12: Vitamin B-12: 1244 pg/mL — ABNORMAL HIGH (ref 180–914)

## 2021-01-24 LAB — HEPATITIS C ANTIBODY: HCV Ab: NONREACTIVE

## 2021-01-24 LAB — MAGNESIUM: Magnesium: 2 mg/dL (ref 1.7–2.4)

## 2021-01-24 LAB — TSH: TSH: 2.713 u[IU]/mL (ref 0.350–4.500)

## 2021-01-24 IMAGING — NM NM HEPATOBILIARY IMAGE, INC GB
2 series · 12 of 12 positions shown · non-contrast
Comparison: CT [DATE]

CLINICAL DATA: Abdominal pain.  Cholelithiasis

EXAM:
NUCLEAR MEDICINE HEPATOBILIARY IMAGING
TECHNIQUE: Sequential images of the abdomen were obtained [DATE] minutes
following intravenous administration of radiopharmaceutical.
RADIOPHARMACEUTICALS:  5.6 mCi [7G]  Choletec IV

[Series 1000: hepatobiliary scan-post morphine 30 min dynamic · 9.59mm/px · 6 of 30 frames shown]
[frame 3/30]
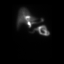
[frame 8/30]
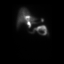
[frame 13/30]
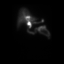
[frame 18/30]
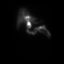
[frame 23/30]
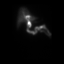
[frame 28/30]
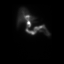

[Series 1000: hepatobiliary scan · 9.59mm/px · 6 of 60 frames shown]
[frame 6/60]
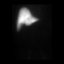
[frame 16/60]
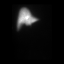
[frame 26/60]
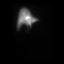
[frame 36/60]
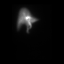
[frame 46/60]
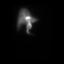
[frame 56/60]
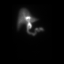

[12 of 12 positions shown; findings below may reference images not displayed]

FINDINGS: Prompt clearance of radiotracer from blood pool and homogeneous
uptake in liver. Counts are evident in the small bowel by 50
minutes. At 90 minutes, IV morphine was administered. Gallbladder
failed to contract after morphine augmentation.
IMPRESSION: Non filling of the gall bladder is consistent with cystic duct
obstruction (acute cholecystitis).

These results will be called to the ordering clinician or
representative by the Radiologist Assistant, and communication
documented in the PACS or [REDACTED].

## 2021-01-24 MED ORDER — SODIUM CHLORIDE 0.9 % IV SOLN
2.0000 g | INTRAVENOUS | Status: DC
  Administered 2021-01-24: 2 g via INTRAVENOUS
  Filled 2021-01-24 (×2): qty 20

## 2021-01-24 MED ORDER — SODIUM BICARBONATE 650 MG PO TABS
650.0000 mg | ORAL_TABLET | Freq: Three times a day (TID) | ORAL | Status: DC
  Administered 2021-01-24 – 2021-01-31 (×20): 650 mg via ORAL
  Filled 2021-01-24 (×20): qty 1

## 2021-01-24 MED ORDER — TECHNETIUM TC 99M MEBROFENIN IV KIT
5.0000 | PACK | Freq: Once | INTRAVENOUS | Status: AC | PRN
  Administered 2021-01-24: 5.6 via INTRAVENOUS

## 2021-01-24 MED ORDER — MORPHINE SULFATE (PF) 4 MG/ML IV SOLN
3.0000 mg | Freq: Once | INTRAVENOUS | Status: AC
  Administered 2021-01-24: 3 mg via INTRAVENOUS
  Filled 2021-01-24: qty 1

## 2021-01-24 MED ORDER — PIPERACILLIN-TAZOBACTAM IN DEX 2-0.25 GM/50ML IV SOLN
2.2500 g | Freq: Three times a day (TID) | INTRAVENOUS | Status: DC
  Administered 2021-01-24 – 2021-01-31 (×20): 2.25 g via INTRAVENOUS
  Filled 2021-01-24 (×25): qty 50

## 2021-01-24 MED ORDER — DILTIAZEM HCL-DEXTROSE 125-5 MG/125ML-% IV SOLN (PREMIX)
5.0000 mg/h | INTRAVENOUS | Status: DC
Start: 2021-01-24 — End: 2021-01-25
  Administered 2021-01-24 – 2021-01-25 (×2): 5 mg/h via INTRAVENOUS
  Filled 2021-01-24 (×2): qty 125

## 2021-01-24 MED ORDER — SODIUM CHLORIDE 0.9 % IV BOLUS
500.0000 mL | Freq: Once | INTRAVENOUS | Status: AC
  Administered 2021-01-24: 500 mL via INTRAVENOUS

## 2021-01-24 MED ORDER — SODIUM CHLORIDE 0.9 % IV SOLN: INTRAVENOUS | Status: DC

## 2021-01-24 NOTE — Progress Notes (Signed)
Initial Nutrition Assessment  DOCUMENTATION CODES:  Not applicable  INTERVENTION:  Recommend Carb Modified diet once out of procedure with no salt packets added to trays Monitor intake trends for adequacy, consider adding TID snacks if not meeting needs as pt does not like supplements MVI with minerals daily Request new measured weight  NUTRITION DIAGNOSIS:  Inadequate oral intake related to decreased appetite as evidenced by per patient/family report.  GOAL:  Patient will meet greater than or equal to 90% of their needs  MONITOR:  PO intake, Weight trends, Labs  REASON FOR ASSESSMENT:  Malnutrition Screening Tool    ASSESSMENT:  80 y.o. male with hx of DM type 2, HTN, HLD, CKD3, and anemia [facility] with weakness, low blood pressure and abnormal labs (BUN, creatinine, LFTs). Reports weight loss and poor appetite PTA.  Noted that pt was admitted 8/19-8/21. Pt not followed by RD during that admission but pt reports he has had poor intake and food has had lack of taste since discharge.  Imaging in ED showed gallstones. Surgery following and plans for HIDA scan to evaluate cholecystitis.   Pt out of room for procedure at the time of assessment. Wife in room able to provide some nutrition hx. Wife reports that normally pt's appetite is very good and he likes to eat. States that decreased intake has been acute only over the last few days since getting sick. Wife reports pt was hungry this AM, has already planned what he wants to eat for dinner.  Inquired about nutrition supplements, pt does not like ensure or boost shakes and wife reports he wouldn't drink. Will monitor intake of meals and determine an appropriate intervention if inadequate.   Noted a significant weight loss in the last month in chart. However, unsure of accuracy as 12.6% loss is an extremely severe change x 1 month. Will request new wt be obtained once pt is back in room.  Nutritionally  Relevant Medications: Scheduled Meds:  multivitamin with minerals  1 tablet Oral Daily   pantoprazole  40 mg Oral Daily   vitamin B-12  1,000 mcg Oral Daily   Continuous Infusions:  cefTRIAXone (ROCEPHIN)  IV 2 g (01/24/21 0559)   metronidazole 500 mg (01/24/21 0708)   PRN Meds:.ondansetron  Labs Reviewed: BUN 83, creatinine 7.81 Lab Results  Component Value Date   ALT 162 (H) 01/24/2021   AST 303 (H) 01/24/2021   ALKPHOS 31 (L) 01/24/2021   BILITOT 0.6 01/24/2021   NUTRITION - FOCUSED PHYSICAL EXAM: Defer to in-person assessment  Diet Order:   Diet Order             Diet NPO time specified Except for: Ice Chips, Sips with Meds  Diet effective midnight                   EDUCATION NEEDS:  No education needs have been identified at this time  Skin:  Skin Assessment: Reviewed RN Assessment  Last BM:  10/20  Height:  Ht Readings from Last 1 Encounters:  01/23/21 6\' 6"  (1.981 m)    Weight:  Wt Readings from Last 1 Encounters:  01/23/21 97.5 kg   Ideal Body Weight:  97.3 kg  BMI:  Body mass index is 24.85 kg/m.  Estimated Nutritional Needs:  Kcal:  2300-2500 kcal/d Protein:  110-120 g/d Fluid:  2.4-2.6 L/d   Ranell Patrick, RD, LDN Clinical Dietitian RD pager # available in [place]  After hours/weekend pager # available in [facility]

## 2021-01-24 NOTE — Progress Notes (Signed)
[facility]  ROUNDING NOTE   Subjective:   Gregory Dixon is a 80 year old male with past medical history of B12 deficiency, hypertension, hyperlipidemia, and CKD.  Patient presents to the emergency room with complaints of weakness.  Admitted to observation status for Lactic acid increased [E87.20] Weakness [R53.1] RUQ pain [R10.11]  Patient is known to our clinic from previous admissions.  He is a patient of Dr. Holley Raring, though has not been seen since April 2021.  Patient states weakness began 3 days ago along with chills.  Denies sick contacts.  Reports decreased appetite and states he has chronic diarrhea, 2-3 watery BMs daily.  Denies shortness of breath and abdominal pain.  Pertinent labs on admission include sodium 134, potassium 4.0, BUN 70, creatinine 7.73, and EGFR of 7.  Chest x-ray negative.  Abdominal ultrasound shows gallbladder sludge with nonmobile stone in gallbladder neck.  We have been consulted to evaluate acute kidney injury.   Objective:  Vital signs in last 24 hours:  Temp:  [97.6 F (36.4 C)-98.4 F (36.9 C)] 97.6 F (36.4 C) (10/20 0952) Pulse Rate:  [57-124] 123 (10/20 1104) Resp:  [6-20] 16 (10/20 0952) BP: (80-133)/(31-77) 91/75 (10/20 1104) SpO2:  [94 %-100 %] 98 % (10/20 0952) Weight:  [97.5 kg] 97.5 kg (10/19 1304)  Weight change:  Filed Weights   01/23/21 1304  Weight: 97.5 kg    Intake/Output: I/O last 3 completed shifts: In: 2300 [IV Piggyback:2300] Out: -    Intake/Output this shift:  Total I/O In: -  Out: 350 [Urine:350]  Physical Exam: General: NAD, laying in bed  Head: Normocephalic, atraumatic. Moist oral mucosal membranes  Eyes: Anicteric  Lungs:  Clear to auscultation, normal effort  Heart: Irregular rhythm  Abdomen:  Soft, nontender  Extremities: Trace peripheral edema.  Neurologic: Nonfocal, moving all four extremities  Skin: No lesions       Basic Metabolic Panel: Recent Labs  Lab 01/23/21 1058  01/24/21 0609  NA 134* 137  K 4.0 3.9  CL 102 109  CO2 19* 18*  GLUCOSE 121* 117*  BUN 70* 83*  CREATININE 7.73* 7.81*  CALCIUM 8.0* 8.3*  MG  --  2.0    Liver Function Tests: Recent Labs  Lab 01/23/21 1058 01/24/21 0609  AST 682* 303*  ALT 189* 162*  ALKPHOS 32* 31*  BILITOT 0.8 0.6  PROT 6.2* 5.6*  ALBUMIN 2.8* 2.5*   Recent Labs  Lab 01/23/21 1330  LIPASE 34   No results for input(s): AMMONIA in the last 168 hours.  CBC: Recent Labs  Lab 01/23/21 1058 01/24/21 0609  WBC 6.7 5.3  NEUTROABS 6.0  --   HGB 9.6* 8.8*  HCT 30.4* 26.6*  MCV 90.2 88.7  PLT 125* 128*    Cardiac Enzymes: No results for input(s): CKTOTAL, CKMB, CKMBINDEX, TROPONINI in the last 168 hours.  BNP: Invalid input(s): POCBNP  CBG: No results for input(s): GLUCAP in the last 168 hours.  Microbiology: Results for orders placed or performed during the hospital encounter of 01/23/21  Blood culture (routine x 2)     Status: None (Preliminary result)   Collection Time: 01/23/21  1:12 PM   Specimen: BLOOD  Result Value Ref Range Status   Specimen Description BLOOD RIGHT ANTECUBITAL  Final   Special Requests   Final    BOTTLES DRAWN AEROBIC AND ANAEROBIC Blood Culture results may not be optimal due to an inadequate volume of blood received in culture bottles   Culture  Setup Time  Final    GRAM NEGATIVE RODS ANAEROBIC BOTTLE ONLY Organism ID to follow CRITICAL RESULT CALLED TO, READ BACK BY AND VERIFIED WITH: NATHAN [email or web address]  01/24/21 RH Performed at [facility], [address] [place], [place] [address]    Culture GRAM NEGATIVE RODS  Final   Report Status PENDING  Incomplete  Blood Culture ID Panel (Reflexed)     Status: Abnormal   Collection Time: 01/23/21  1:12 PM  Result Value Ref Range Status   Enterococcus faecalis NOT DETECTED NOT DETECTED Final   Enterococcus Faecium NOT DETECTED NOT DETECTED Final   Listeria monocytogenes NOT DETECTED NOT DETECTED Final    Staphylococcus species NOT DETECTED NOT DETECTED Final   Staphylococcus aureus (BCID) NOT DETECTED NOT DETECTED Final   Staphylococcus epidermidis NOT DETECTED NOT DETECTED Final   Staphylococcus lugdunensis NOT DETECTED NOT DETECTED Final   Streptococcus species NOT DETECTED NOT DETECTED Final   Streptococcus agalactiae NOT DETECTED NOT DETECTED Final   Streptococcus pneumoniae NOT DETECTED NOT DETECTED Final   Streptococcus pyogenes NOT DETECTED NOT DETECTED Final   A.calcoaceticus-baumannii NOT DETECTED NOT DETECTED Final   Bacteroides fragilis NOT DETECTED NOT DETECTED Final   Enterobacterales DETECTED (A) NOT DETECTED Final    Comment: Enterobacterales represent a large order of gram negative bacteria, not a single organism. CRITICAL RESULT CALLED TO, READ BACK BY AND VERIFIED WITH: NATHAN BLUE@[email or web address]  01/24/21 RH    Enterobacter cloacae complex NOT DETECTED NOT DETECTED Final   Escherichia coli NOT DETECTED NOT DETECTED Final   Klebsiella aerogenes NOT DETECTED NOT DETECTED Final   Klebsiella oxytoca DETECTED (A) NOT DETECTED Final    Comment: CRITICAL RESULT CALLED TO, READ BACK BY AND VERIFIED WITH: NATHAN BLUE@0422  01/24/21 RH    Klebsiella pneumoniae NOT DETECTED NOT DETECTED Final   Proteus species NOT DETECTED NOT DETECTED Final   Salmonella species NOT DETECTED NOT DETECTED Final   Serratia marcescens NOT DETECTED NOT DETECTED Final   Haemophilus influenzae NOT DETECTED NOT DETECTED Final   Neisseria meningitidis NOT DETECTED NOT DETECTED Final   Pseudomonas aeruginosa NOT DETECTED NOT DETECTED Final   Stenotrophomonas maltophilia NOT DETECTED NOT DETECTED Final   Candida albicans NOT DETECTED NOT DETECTED Final   Candida auris NOT DETECTED NOT DETECTED Final   Candida glabrata NOT DETECTED NOT DETECTED Final   Candida krusei NOT DETECTED NOT DETECTED Final   Candida parapsilosis NOT DETECTED NOT DETECTED Final   Candida tropicalis NOT DETECTED NOT DETECTED Final    Cryptococcus neoformans/gattii NOT DETECTED NOT DETECTED Final   CTX-M ESBL NOT DETECTED NOT DETECTED Final   Carbapenem resistance IMP NOT DETECTED NOT DETECTED Final   Carbapenem resistance KPC NOT DETECTED NOT DETECTED Final   Carbapenem resistance NDM NOT DETECTED NOT DETECTED Final   Carbapenem resist OXA 48 LIKE NOT DETECTED NOT DETECTED Final   Carbapenem resistance VIM NOT DETECTED NOT DETECTED Final    Comment: Performed at [facility], [address] [place], [place] [address]  Blood culture (routine x 2)     Status: None (Preliminary result)   Collection Time: 01/23/21  1:17 PM   Specimen: BLOOD  Result Value Ref Range Status   Specimen Description BLOOD BLOOD RIGHT FOREARM  Final   Special Requests   Final    BOTTLES DRAWN AEROBIC AND ANAEROBIC Blood Culture results may not be optimal due to an inadequate volume of blood received in culture bottles   Culture   Final    NO GROWTH < 24 HOURS Performed at  [facility], [address], [place], [place] [address]    Report Status PENDING  Incomplete  Resp Panel by RT-PCR (Flu A&B, Covid) Nasopharyngeal Swab     Status: None   Collection Time: 01/23/21  4:49 PM   Specimen: Nasopharyngeal Swab; Nasopharyngeal(NP) swabs in vial transport medium  Result Value Ref Range Status   SARS Coronavirus 2 by RT PCR NEGATIVE NEGATIVE Final    Comment: (NOTE) SARS-CoV-2 target nucleic acids are NOT DETECTED.  The SARS-CoV-2 RNA is generally detectable in upper respiratory specimens during the acute phase of infection. The lowest concentration of SARS-CoV-2 viral copies this assay can detect is 138 copies/mL. A negative result does not preclude SARS-Cov-2 infection and should not be used as the sole basis for treatment or other patient management decisions. A negative result may occur with  improper specimen collection/handling, submission of specimen other than nasopharyngeal swab, presence of viral mutation(s) within  the areas targeted by this assay, and inadequate number of viral copies(<138 copies/mL). A negative result must be combined with clinical observations, patient history, and epidemiological information. The expected result is Negative.  Fact Sheet for Patients:  EntrepreneurPulse.com.au  Fact Sheet for Healthcare Providers:  IncredibleEmployment.be  This test is no t yet approved or cleared by the [place] FDA and  has been authorized for detection and/or diagnosis of SARS-CoV-2 by FDA under an Emergency Use Authorization (EUA). This EUA will remain  in effect (meaning this test can be used) for the duration of the COVID-19 declaration under Section 564(b)(1) of the Act, 21 U.S.C.section 360bbb-3(b)(1), unless the authorization is terminated  or revoked sooner.       Influenza A by PCR NEGATIVE NEGATIVE Final   Influenza B by PCR NEGATIVE NEGATIVE Final    Comment: (NOTE) The Xpert Xpress SARS-CoV-2/FLU/RSV plus assay is intended as an aid in the diagnosis of influenza from Nasopharyngeal swab specimens and should not be used as a sole basis for treatment. Nasal washings and aspirates are unacceptable for Xpert Xpress SARS-CoV-2/FLU/RSV testing.  Fact Sheet for Patients: EntrepreneurPulse.com.au  Fact Sheet for Healthcare Providers: IncredibleEmployment.be  This test is not yet approved or cleared by the [place] FDA and has been authorized for detection and/or diagnosis of SARS-CoV-2 by FDA under an Emergency Use Authorization (EUA). This EUA will remain in effect (meaning this test can be used) for the duration of the COVID-19 declaration under Section 564(b)(1) of the Act, 21 U.S.C. section 360bbb-3(b)(1), unless the authorization is terminated or revoked.  Performed at [facility], [address] [place], [place] [address]     Coagulation Studies: No results for input(s):  LABPROT, INR in the last 72 hours.  Urinalysis: No results for input(s): COLORURINE, LABSPEC, PHURINE, GLUCOSEU, HGBUR, BILIRUBINUR, KETONESUR, PROTEINUR, UROBILINOGEN, NITRITE, LEUKOCYTESUR in the last 72 hours.  Invalid input(s): APPERANCEUR    Imaging: CT ABDOMEN PELVIS WO CONTRAST  Result Date: 01/23/2021 CLINICAL DATA:  Abdominal pain, acute, nonlocalized. Generalized weakness. Hypotension. EXAM: CT ABDOMEN AND PELVIS WITHOUT CONTRAST TECHNIQUE: Multidetector CT imaging of the abdomen and pelvis was performed following the standard protocol without IV contrast. COMPARISON:  08/09/2012 FINDINGS: Lower chest: Linear scarring or atelectasis at the right lung base. Otherwise no active lower chest finding. Hepatobiliary: Liver appears normal without contrast. 1 cm calcified stone in the gallbladder neck without definite CT evidence of cholecystitis. Consider right upper quadrant ultrasound if concern persists regarding possible clinical cholecystitis. Pancreas: Normal Spleen: Normal Adrenals/Urinary Tract: Adrenal glands are normal. Multiple bilateral renal  cysts. Nonobstructing 6 mm stone in the lower pole of the left kidney. No passing stone or hydronephrosis. No stone in the bladder. Stomach/Bowel: Stomach and small intestine are normal. No evidence of diverticulosis or diverticulitis. Vascular/Lymphatic: Aortic atherosclerosis. No aneurysm. IVC is normal. No retroperitoneal adenopathy. Reproductive: Enlarged prostate. Other: Tiny amount of free fluid in the pelvic cul-de-sac. No free air. Musculoskeletal: Ordinary lower lumbar degenerative changes. IMPRESSION: 1 cm gallstone in the gallbladder neck. No CT evidence of cholecystitis. Consider right upper quadrant ultrasound if there is clinical concern regarding potential cholecystitis. Aortic Atherosclerosis (ICD10-I70.0). Multiple renal cysts. 6 mm nonobstructing stone in the lower pole of the left kidney. Enlarged prostate. Electronically Signed    By: Nelson Chimes M.D.   On: 01/23/2021 14:58   DG Chest Port 1 View  Result Date: 01/23/2021 CLINICAL DATA:  Weakness Increase lactic acid EXAM: PORTABLE CHEST 1 VIEW COMPARISON:  None. FINDINGS: Heart size within normal limits. No pulmonary vascular congestion. Right hemidiaphragm is elevated. Lungs are clear. IMPRESSION: No acute cardiopulmonary process. Electronically Signed   By: Miachel Roux M.D.   On: 01/23/2021 16:32   US Abdomen Limited RUQ (LIVER/GB)  Result Date: 01/23/2021 CLINICAL DATA:  Right upper quadrant pain EXAM: ULTRASOUND ABDOMEN LIMITED RIGHT UPPER QUADRANT COMPARISON:  CT 01/23/2021 FINDINGS: Gallbladder: Sludge within the gallbladder. 16 mm non mobile stone at the gallbladder neck. Slight increased wall thickness of 3.7 mm but negative sonographic Murphy. Common bile duct: Diameter: 5.2 mm Liver: No focal lesion identified. Within normal limits in parenchymal echogenicity. Portal vein is patent on color Doppler imaging with normal direction of blood flow towards the liver. Other: Right kidney cortex appears slightly echogenic. IMPRESSION: 1. Sludge in the gallbladder with non mobile stone at the gallbladder neck with slight increased wall thickness but negative sonographic Murphy. Findings are indeterminate for cholecystitis, consider correlation with nuclear medicine hepatobiliary imaging 2. Right kidney cortex appears slightly echogenic suggesting medical renal disease, correlate with appropriate laboratory values Electronically Signed   By: Donavan Foil M.D.   On: 01/23/2021 15:45     Medications:    cefTRIAXone (ROCEPHIN)  IV 2 g (01/24/21 0559)   diltiazem (CARDIZEM) infusion     metronidazole 500 mg (01/24/21 0708)    carvedilol  3.125 mg Oral BID WC   heparin  5,000 Units Subcutaneous Q8H   isosorbide mononitrate  30 mg Oral QPM   multivitamin with minerals  1 tablet Oral Daily   pantoprazole  40 mg Oral Daily   vitamin B-12  1,000 mcg Oral Daily   ondansetron  **OR** ondansetron (ZOFRAN) IV  Assessment/ Plan:  Gregory Dixon is a 80 y.o.  male with past medical history of B12 deficiency, hypertension, hyperlipidemia, and CKD.  Patient presents to the emergency room with complaints of weakness.  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  Acute kidney injury secondary to prerenal azotemia due to progressive diarrhea and poor appetite. Losartan held Abdominal ultrasound showed slightly echogenic right kidney Patient currently at risk of needing hemodialysis.  Discussed this with patient and the different modalities available for dialysis.  Patient possibly interested in home dialysis.  We will determine if candidate based on gallbladder evaluation.  No acute need for dialysis at this time.  Will order gentle hydration of normal saline at 50 MLS per hour.  Lab  Results  Component Value Date   CREATININE 7.81 (H) 01/24/2021   CREATININE 7.73 (H) 01/23/2021   CREATININE 4.69 (H) 11/30/2020    Intake/Output Summary (Last 24 hours) at 01/24/2021 1106 Last data filed at 01/24/2021 1013 Gross per 24 hour  Intake 2300 ml  Output 350 ml  Net 1950 ml   2. Anemia of chronic kidney disease Lab Results  Component Value Date   HGB 8.8 (L) 01/24/2021  Iron supplement twice daily outpatient Iron studies indicate anemia of chronic disease Hemoglobin below target, will monitor and assess need for ESA  3. Secondary Hyperparathyroidism:  Lab Results  Component Value Date   CALCIUM 8.3 (L) 01/24/2021   PHOS 4.0 11/25/2020  Calcium below target but phosphorus at goal  4.  Hypertension with chronic kidney disease.  Home regimen includes carvedilol, hydralazine laying, and isosorbide.  Currently receiving carvedilol, and isosorbide.  5.  Acute cholecystitis.  Abdominal ultrasound  shows gallbladder sludge and nonmobile stone in gallbladder neck.  HIDA scan scheduled today.  General surgery following this case.   LOS: 0 Camdon Saetern 10/20/202211:06 AM

## 2021-01-24 NOTE — Progress Notes (Signed)
   01/24/21 0712  Assess: MEWS Score  Temp 97.9 F (36.6 C)  BP 106/64  Pulse Rate (!) 111  Resp 16  SpO2 99 %  O2 Device Room Air  Assess: MEWS Score  MEWS Temp 0  MEWS Systolic 0  MEWS Pulse 2  MEWS RR 0  MEWS LOC 0  MEWS Score 2  MEWS Score Color Yellow  Assess: if the MEWS score is Yellow or Red  Were vital signs taken at a resting state? Yes  Focused Assessment No change from prior assessment  Does the patient meet 2 or more of the SIRS criteria? No  MEWS guidelines implemented *See Row Information* Yes  Treat  MEWS Interventions Escalated (See documentation below)  Pain Scale 0-10  Pain Score 0  Take Vital Signs  Increase Vital Sign Frequency  Yellow: Q 2hr X 2 then Q 4hr X 2, if remains yellow, continue Q 4hrs  Notify: Provider  Provider Name/Title Sander Radon, MD  Date Provider Notified 01/24/21  Time Provider Notified [phone]  Notification Type Page (Text via secure chat)  Notification Reason Other (Comment) (New onset of afib)  Provider response Other (Comment) (EKG ordered; continue to monitor and update at this time.)  Date of Provider Response 01/24/21  Time of Provider Response [phone]  Document  Patient Outcome Other (Comment) (No changes or new orders at this time.  Pt asymptomatic and in no distress at this time.)  Assess: SIRS CRITERIA  SIRS Temperature  0  SIRS Pulse 1  SIRS Respirations  0  SIRS WBC 0  SIRS Score Sum  1

## 2021-01-24 NOTE — Consult Note (Signed)
Pharmacy Antibiotic Note  Gregory Dixon is a 80 y.o. male admitted on 01/23/2021 with  intra-abdominal infection .  Pharmacy has been consulted for pip/tazo dosing.  BCx: 1 out of 4 bottles GNR, BCID Klebsiella oxytoca  Plan: Zosyn 2.25g IV q8h  Height: 6\' 6"  (198.1 cm) Weight: 108.1 kg (238 lb 5.1 oz) IBW/kg (Calculated) : 91.4  Temp (24hrs), Avg:97.9 F (36.6 C), Min:97.6 F (36.4 C), Max:98.2 F (36.8 C)  Recent Labs  Lab 01/23/21 1058 01/23/21 1312 01/23/21 1751 01/24/21 0609  WBC 6.7  --   --  5.3  CREATININE 7.73*  --   --  7.81*  LATICACIDVEN  --  2.7* 1.1  --     Estimated Creatinine Clearance: 9.9 mL/min (A) (by C-G formula based on SCr of 7.81 mg/dL (H)).    Allergies  Allergen Reactions   Aspirin Other (See Comments)    GI bleed   Codeine Nausea Only and Nausea And Vomiting    Antimicrobials this admission: 10/20 cefepime, ceftriaxone and flagl >> 10/20 10/20 Pip/taz >>   Dose adjustments this admission: None  Microbiology results: 10/19 BCx: 1 out of 4 bottles GNR, BCID Klebsiella oxytoca  Thank you for allowing pharmacy to be a part of this patient's care.  Oswald Hillock, PharmD, BCPS 01/24/2021 4:44 PM

## 2021-01-24 NOTE — Progress Notes (Signed)
PROGRESS NOTE    Gregory Dixon  BWL:[id number] DOB: March 13, 1941 DOA: 01/23/2021 PCP: Valera Castle, MD    Brief Narrative:  Gregory Dixon was admitted to the hospital with the working diagnosis of acute cholecystitis.  80 year old male past medical history for chronic kidney disease stage IV, B12 deficiency, hypertension and dyslipidemia who presented with weakness.  Reported 3-4 days of chills, difficulty ambulating, no chest pain, dyspnea or abdominal pain.  His blood pressure was 121/48, heart rate 69, respiratory 14, temperature 98.1, oxygen saturation 96%.  On his initial physical exam his lungs are clear to auscultation bilaterally, heart S1-S2, present, rhythmic, abdomen soft, nontender, no lower extremity edema.  Sodium 134, potassium 4.0, chloride 102, bicarb 19, glucose 121, BUN 17, creatinine 7.73, anion gap 13, white count 6.7, hemoglobin 9.6, hematocrit 30.4, platelets 125. SARS COVID-19 negative.  CT scan with 1 cm gallstone in the gallbladder neck.  No CT evidence of cholecystitis.  Multiple renal cysts.  6 mm nonobstructing stone in the lower pole of the left kidney.  Enlarged prostate.  Chest radiograph with mild cardiomegaly, hilar vascular congestion.  Abdominal ultrasound with sludge in the gallbladder, nonmobile stone at the gallbladder neck with slight increase wall thickness but negative sonographic Murphy sign.  Patient has been placed on intravenous antibiotics, surgery was consulted. Recommendations to get a HIDA scan.  Patient developed new onset atrial fibrillation with rapid ventricular response, placed on diltiazem drip.  EKG 127 bpm, left axis deviation, atrial fibrillation rhythm, Q-wave V1-V2, no significant ST segment T wave changes, positive LVH.  HIDA scan positive for cholecystitis, and IR consulted for percutaneous drain placement.   Assessment & Plan:   Principal Problem:   Cholelithiasis Active Problems:   Benign prostatic hyperplasia    Chronic kidney disease   Diabetic peripheral neuropathy associated with type 2 diabetes mellitus (HCC)   Hyperlipidemia   Anemia associated with chronic renal failure   Weakness   Elevated LFTs   Transaminitis   Acute cholecystitis. Patient with no significant abdominal pian, no nausea or vomiting. Murphy signs negative. Positive HIDA scan, for cholecystitis.  Case discussed with surgery team Dr. Christian Mate and plan for cholecystectomy drain place percutaneously in am. I called IR and patient will be NPO past midnight, follow cell count, renal function and coagulation studies in am.   Continue antibiotic therapy with Zosyn.   2. Acute NEW onset atrial fibrillation. This am patient in RVR due to atrial fibrillation. Placed on diltiazem for rate control with good toleration. Continue with oral carvedilol.   Plan to continue telemetry monitor and transition to oral agents in the next 24 hrs after cholecystostomy drain placement.  Hold anticoagulation until completed IR procedure.   3. AKI on CKD stage IV to V. Non anion gap metabolic acidosis  Patient with edema at the lower extremities and mild hilar vascular congestion on chest film.   Renal function with serum cr at 7,8 with K at 3,9 and serum bicarbonate at 18. Anion gap is 10.  Plan to continue hydration with NS at 50 ml per hr, add sodium bicarbonate po and follow up renal function in am, including Mg and P.  Avoid hypotension and nephrotoxic medications.   4. HTN. Continue blood pressure monitoring. Hold on antihypertensive medications due to risk of hypotension.   5. Chronic anemia/ MUGS. Coninue close follow up cell count.   Patient continue to be at high risk for worsening cholecystitis.   Status is: Observation  The patient will require care  spanning > 2 midnights and should be moved to inpatient because: IV antibiotics       DVT prophylaxis: Scd   Code Status:    full  Family Communication:  I spoke with  patient's wife at the bedside, we talked in detail about patient's condition, plan of care and prognosis and all questions were addressed.     Consultants:  Surgery  IR    Antimicrobials:  Zosyn     Subjective: Patient with no nausea or vomiting, he has been npo. No chest pain, or dyspnea., positive lower extremity edema,   Objective: Vitals:   01/24/21 1128 01/24/21 1156 01/24/21 1242 01/24/21 1323  BP: 92/65 (!) 95/47 (!) 110/53 (!) 110/51  Pulse: (!) 128 (!) 126 81 (!) 109  Resp: 18  16   Temp: 98 F (36.7 C)  98 F (36.7 C)   TempSrc:      SpO2: 99%  99%   Weight:      Height:        Intake/Output Summary (Last 24 hours) at 01/24/2021 1437 Last data filed at 01/24/2021 1013 Gross per 24 hour  Intake 2300 ml  Output 350 ml  Net 1950 ml   Filed Weights   01/23/21 1304  Weight: 97.5 kg    Examination:   General: Not in pain or dyspnea, deconditioned  Neurology: Awake and alert, non focal  E ENT: mild pallor, no icterus, oral mucosa moist Cardiovascular: No JVD. S1-S2 present, rhythmic, no gallops, rubs, or murmurs. + bilateral non pitting lower extremity edema. Pulmonary positive breath sounds bilaterally, adequate air movement, no wheezing, rhonchi or rales. Gastrointestinal. Abdomen soft and non tender Skin. No rashes Musculoskeletal: no joint deformities     Data Reviewed: I have personally reviewed following labs and imaging studies  CBC: Recent Labs  Lab 01/23/21 1058 01/24/21 0609  WBC 6.7 5.3  NEUTROABS 6.0  --   HGB 9.6* 8.8*  HCT 30.4* 26.6*  MCV 90.2 88.7  PLT 125* 601*   Basic Metabolic Panel: Recent Labs  Lab 01/23/21 1058 01/24/21 0609  NA 134* 137  K 4.0 3.9  CL 102 109  CO2 19* 18*  GLUCOSE 121* 117*  BUN 70* 83*  CREATININE 7.73* 7.81*  CALCIUM 8.0* 8.3*  MG  --  2.0   GFR: Estimated Creatinine Clearance: 9.9 mL/min (A) (by C-G formula based on SCr of 7.81 mg/dL (H)). Liver Function Tests: Recent Labs  Lab  01/23/21 1058 01/24/21 0609  AST 682* 303*  ALT 189* 162*  ALKPHOS 32* 31*  BILITOT 0.8 0.6  PROT 6.2* 5.6*  ALBUMIN 2.8* 2.5*   Recent Labs  Lab 01/23/21 1330  LIPASE 34   No results for input(s): AMMONIA in the last 168 hours. Coagulation Profile: No results for input(s): INR, PROTIME in the last 168 hours. Cardiac Enzymes: No results for input(s): CKTOTAL, CKMB, CKMBINDEX, TROPONINI in the last 168 hours. BNP (last 3 results) No results for input(s): PROBNP in the last 8760 hours. HbA1C: No results for input(s): HGBA1C in the last 72 hours. CBG: No results for input(s): GLUCAP in the last 168 hours. Lipid Profile: No results for input(s): CHOL, HDL, LDLCALC, TRIG, CHOLHDL, LDLDIRECT in the last 72 hours. Thyroid Function Tests: Recent Labs    01/24/21 0609  TSH 2.713   Anemia Panel: Recent Labs    01/23/21 1058 01/24/21 0609  VITAMINB12  --  1,244*  FERRITIN 1,157*  --   TIBC 241*  --   IRON 11*  --  Radiology Studies: I have reviewed all of the imaging during this hospital visit personally     Scheduled Meds:  carvedilol  3.125 mg Oral BID WC   heparin  5,000 Units Subcutaneous Q8H   isosorbide mononitrate  30 mg Oral QPM   multivitamin with minerals  1 tablet Oral Daily   pantoprazole  40 mg Oral Daily   vitamin B-12  1,000 mcg Oral Daily   Continuous Infusions:  sodium chloride 50 mL/hr at 01/24/21 1146   cefTRIAXone (ROCEPHIN)  IV 2 g (01/24/21 0559)   diltiazem (CARDIZEM) infusion 5 mg/hr (01/24/21 1141)   metronidazole 500 mg (01/24/21 0708)     LOS: 0 days        Gregory Pavon Gerome Apley, MD

## 2021-01-24 NOTE — Progress Notes (Signed)
PHARMACY - PHYSICIAN COMMUNICATION CRITICAL VALUE ALERT - BLOOD CULTURE IDENTIFICATION (BCID)  BCID results: 1 (anaerobic) of 4 growing Klebsiella Oxytoca (no resistance).  Pt currently only on Flagyl.  Name of provider contacted: Rachael Fee, NP  Changes to prescribed antibiotics required: Add Ceftriaxone 2 gm q24h.  Renda Rolls, PharmD, MBA 01/24/2021 5:25 AM

## 2021-01-24 NOTE — Progress Notes (Addendum)
[facility] SURGICAL PROGRESS NOTE (cpt [id number])  Hospital Day(s): 0.   Interval History: Patient seen and examined, no acute events or new complaints overnight. Patient reports he is frustrated by not being able to eat. He continues to denied any abdominal pain, nausea, emesis, fever, chills. He is without leukocytosis with WBC to 5.3K. Hgb stable at 8.8. Continues to have significant acute on chronic AKI with sCr - 7.81, BUN 83. He is on Rocephin/Flagyl. He is currently NPO. Plan for HIDA today.   Review of Systems:  Constitutional: denies fever, chills  HEENT: denies cough or congestion  Respiratory: denies any shortness of breath  Cardiovascular: denies chest pain or palpitations  Gastrointestinal: denies abdominal pain, N/V, or diarrhea/and bowel function as per interval history Genitourinary: denies burning with urination or urinary frequency  Vital signs in last 24 hours: [min-max] current  Temp:  [97.7 F (36.5 C)-98.4 F (36.9 C)] 97.9 F (36.6 C) (10/20 0712) Pulse Rate:  [57-111] 111 (10/20 0712) Resp:  [6-20] 16 (10/20 0712) BP: (80-133)/(31-77) 106/64 (10/20 0712) SpO2:  [94 %-100 %] 99 % (10/20 0712) Weight:  [97.5 kg] 97.5 kg (10/19 1304)     Height: 6\' 6"  (198.1 cm) Weight: 97.5 kg BMI (Calculated): 24.85   Intake/Output last 2 shifts:  10/19 0701 - 10/20 0700 In: 2300 [IV Piggyback:2300] Out: -    Physical Exam:  Constitutional: alert, cooperative and no distress  HENT: normocephalic without obvious abnormality  Eyes: PERRL, EOM's grossly intact and symmetric  Neuro: CN II - XII grossly intact and symmetric without deficit  Respiratory: breathing non-labored at rest  Cardiovascular: regular rate and sinus rhythm  Gastrointestinal: soft, non-tender, and non-distended, no rebound/guarding. Negative Murphy's sign    Labs:  CBC Latest Ref Rng & Units 01/24/2021 01/23/2021 12/28/2020  WBC 4.0 - 10.5 K/uL 5.3 6.7 4.3  Hemoglobin 13.0 - 17.0 g/dL  8.8(L) 9.6(L) 8.4(L)  Hematocrit 39.0 - 52.0 % 26.6(L) 30.4(L) 26.7(L)  Platelets 150 - 400 K/uL 128(L) 125(L) 168   CMP Latest Ref Rng & Units 01/24/2021 01/23/2021 11/30/2020  Glucose 70 - 99 mg/dL 117(H) 121(H) 117(H)  BUN 8 - 23 mg/dL 83(H) 70(H) 61(H)  Creatinine 0.61 - 1.24 mg/dL 7.81(H) 7.73(H) 4.69(H)  Sodium 135 - 145 mmol/L 137 134(L) 139  Potassium 3.5 - 5.1 mmol/L 3.9 4.0 4.1  Chloride 98 - 111 mmol/L 109 102 113(H)  CO2 22 - 32 mmol/L 18(L) 19(L) 20(L)  Calcium 8.9 - 10.3 mg/dL 8.3(L) 8.0(L) 8.0(L)  Total Protein 6.5 - 8.1 g/dL 5.6(L) 6.2(L) 5.3(L)  Total Bilirubin 0.3 - 1.2 mg/dL 0.6 0.8 0.3  Alkaline Phos 38 - 126 U/L 31(L) 32(L) 20(L)  AST 15 - 41 U/L 303(H) 682(H) 20  ALT 0 - 44 U/L 162(H) 189(H) 14     Imaging studies: No new pertinent imaging studies; HIDA pending    Assessment/Plan: (ICD-10's: K80.20) 80 y.o. male with gallstone in gallbladder neck with possible subacute cholecystitis, complicated by pertinent comorbidities including acute on chronic renal injury, chronic anemia, and weakness.   - Plan for HIDA scan to better evaluate for cystic duct patency and definitively rule in/out cholecystitis.   - Pending HIDA, may require percutaneous cholecystostomy tube given his acute issues (AKI, Fatigue) vs cholecystectomy tomorrow pending significant improvement. Will defer to Dr Christian Mate regarding decisions/timing  - Monitor renal function; nephrology following; discussing potential for peritoneal dialysis. Will discuss with Dr Christian Mate regarding potential for PD catheter placement.   - He needs to be NPO  for HIDA  - Continue IV Abx  - Monitor abdominal examination  - Pain control prn (Hold narcotics for HIDA); antiemetics prn  - Monitor CBC, CMP   - Mobilization as tolerated   - Further management per primary service; we will follow    All of the above findings and recommendations were discussed with the patient, and the medical team, and all of patient's  questions were answered to his expressed satisfaction.  -- Edison Simon, PA-C [facility] 01/24/2021, 8:26 AM [phone] M-F: 7am - 4pm

## 2021-01-25 ENCOUNTER — Inpatient Hospital Stay: Payer: Medicare Other | Admitting: Radiology

## 2021-01-25 DIAGNOSIS — K8010 Calculus of gallbladder with chronic cholecystitis without obstruction: Secondary | ICD-10-CM | POA: Diagnosis not present

## 2021-01-25 DIAGNOSIS — N179 Acute kidney failure, unspecified: Secondary | ICD-10-CM | POA: Diagnosis not present

## 2021-01-25 DIAGNOSIS — K8000 Calculus of gallbladder with acute cholecystitis without obstruction: Secondary | ICD-10-CM

## 2021-01-25 DIAGNOSIS — D509 Iron deficiency anemia, unspecified: Secondary | ICD-10-CM | POA: Diagnosis not present

## 2021-01-25 HISTORY — PX: IR PERC CHOLECYSTOSTOMY: IMG2326

## 2021-01-25 LAB — CBC WITH DIFFERENTIAL/PLATELET
Abs Immature Granulocytes: 0.03 10*3/uL (ref 0.00–0.07)
Basophils Absolute: 0 10*3/uL (ref 0.0–0.1)
Basophils Relative: 1 %
Eosinophils Absolute: 0.1 10*3/uL (ref 0.0–0.5)
Eosinophils Relative: 2 %
HCT: 27.9 % — ABNORMAL LOW (ref 39.0–52.0)
Hemoglobin: 9.1 g/dL — ABNORMAL LOW (ref 13.0–17.0)
Immature Granulocytes: 1 %
Lymphocytes Relative: 11 %
Lymphs Abs: 0.4 10*3/uL — ABNORMAL LOW (ref 0.7–4.0)
MCH: 28.3 pg (ref 26.0–34.0)
MCHC: 32.6 g/dL (ref 30.0–36.0)
MCV: 86.9 fL (ref 80.0–100.0)
Monocytes Absolute: 0.3 10*3/uL (ref 0.1–1.0)
Monocytes Relative: 7 %
Neutro Abs: 3.2 10*3/uL (ref 1.7–7.7)
Neutrophils Relative %: 78 %
Platelets: 148 10*3/uL — ABNORMAL LOW (ref 150–400)
RBC: 3.21 MIL/uL — ABNORMAL LOW (ref 4.22–5.81)
RDW: 15 % (ref 11.5–15.5)
WBC: 4.1 10*3/uL (ref 4.0–10.5)
nRBC: 0 % (ref 0.0–0.2)

## 2021-01-25 LAB — RENAL FUNCTION PANEL
Albumin: 2.4 g/dL — ABNORMAL LOW (ref 3.5–5.0)
Anion gap: 11 (ref 5–15)
BUN: 99 mg/dL — ABNORMAL HIGH (ref 8–23)
CO2: 18 mmol/L — ABNORMAL LOW (ref 22–32)
Calcium: 8.2 mg/dL — ABNORMAL LOW (ref 8.9–10.3)
Chloride: 110 mmol/L (ref 98–111)
Creatinine, Ser: 8.37 mg/dL — ABNORMAL HIGH (ref 0.61–1.24)
GFR, Estimated: 6 mL/min — ABNORMAL LOW (ref >60)
Glucose, Bld: 127 mg/dL — ABNORMAL HIGH (ref 70–99)
Phosphorus: 6.7 mg/dL — ABNORMAL HIGH (ref 2.5–4.6)
Potassium: 4.1 mmol/L (ref 3.5–5.1)
Sodium: 139 mmol/L (ref 135–145)

## 2021-01-25 LAB — HEPATIC FUNCTION PANEL
ALT: 131 U/L — ABNORMAL HIGH (ref 0–44)
AST: 146 U/L — ABNORMAL HIGH (ref 15–41)
Albumin: 2.2 g/dL — ABNORMAL LOW (ref 3.5–5.0)
Alkaline Phosphatase: 28 U/L — ABNORMAL LOW (ref 38–126)
Bilirubin, Direct: 0.1 mg/dL (ref 0.0–0.2)
Indirect Bilirubin: 0.3 mg/dL (ref 0.3–0.9)
Total Bilirubin: 0.4 mg/dL (ref 0.3–1.2)
Total Protein: 5.3 g/dL — ABNORMAL LOW (ref 6.5–8.1)

## 2021-01-25 LAB — HEMOGLOBIN A1C
Hgb A1c MFr Bld: 5.6 % (ref 4.8–5.6)
Mean Plasma Glucose: 114 mg/dL

## 2021-01-25 LAB — GLUCOSE, CAPILLARY
Glucose-Capillary: 109 mg/dL — ABNORMAL HIGH (ref 70–99)
Glucose-Capillary: 145 mg/dL — ABNORMAL HIGH (ref 70–99)
Glucose-Capillary: 164 mg/dL — ABNORMAL HIGH (ref 70–99)
Glucose-Capillary: 115 mg/dL — ABNORMAL HIGH (ref 70–99)

## 2021-01-25 LAB — HEPATITIS B SURFACE ANTIBODY, QUANTITATIVE: Hep B S AB Quant (Post): <3.1 m[IU]/mL — ABNORMAL LOW (ref >9.9)

## 2021-01-25 LAB — PROTIME-INR
INR: 1.3 — ABNORMAL HIGH (ref 0.8–1.2)
Prothrombin Time: 16.5 seconds — ABNORMAL HIGH (ref 11.4–15.2)

## 2021-01-25 IMAGING — XA IR CHOLECYSTOSTOMY
1 series · 2 of 2 positions shown · non-contrast
Comparison: none

INDICATION: 80-year-old with evidence of acute cholecystitis based on imaging.
Request for a percutaneous cholecystostomy tube placement.

[Series 1: interv standard · 2 of 2 slices shown]
[im 1/2]
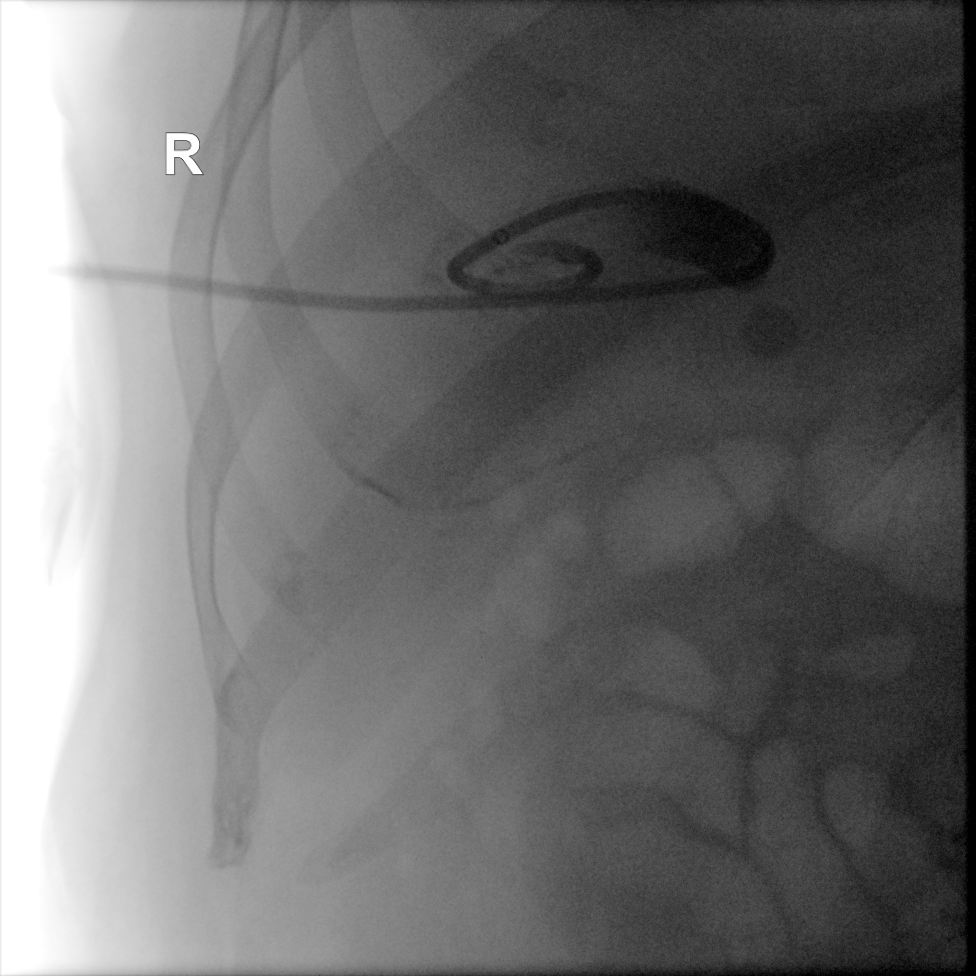
[im 2/2]
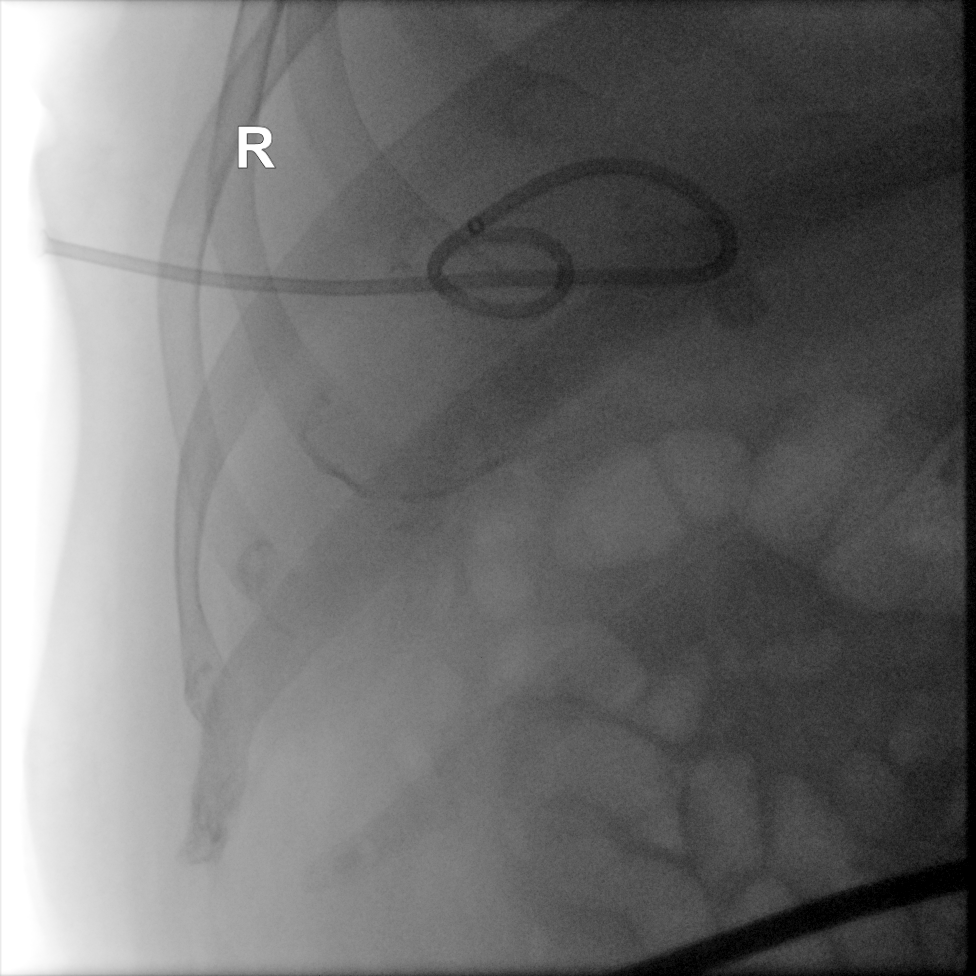

[2 of 2 positions shown; findings below may reference images not displayed]

EXAM:
Percutaneous cholecystostomy tube placement with ultrasound and
fluoroscopic guidance.

MEDICATIONS:
Moderate sedation.  Patient is receiving scheduled IV antibiotics.

ANESTHESIA/SEDATION:
Moderate (conscious) sedation was employed during this procedure. A
total of Versed 1.0mg and fentanyl 50 mcg was administered
intravenously at the order of the provider performing the procedure.

Total intra-service moderate sedation time: 14 minutes.

Patient's level of consciousness and vital signs were monitored
continuously by radiology nurse throughout the procedure under the
supervision of the provider performing the procedure.

FLUOROSCOPY TIME:  Fluoroscopy Time: 12 seconds, 2.4 mGy

CONTRAST:  3 mL Omnipaque 350

COMPLICATIONS:
None immediate.

PROCEDURE:
Informed written consent was obtained from the patient after a
thorough discussion of the procedural risks, benefits and
alternatives. All questions were addressed. A timeout was performed
prior to the initiation of the procedure.

Ultrasound was used to identify the gallbladder. Ultrasound image
was saved for documentation. The right side of the abdomen was
prepped and draped in sterile fashion. Maximal barrier sterile
technique was utilized including caps, mask, sterile gowns, sterile
gloves, sterile drape, hand hygiene and skin antiseptic. Skin was
anesthetized using 1% lidocaine. Small incision was made. Using
ultrasound guidance, an 18 gauge trocar needle was directed into the
gallbladder using a transhepatic approach. A superstiff Amplatz wire
was advanced to the gallbladder using ultrasound and fluoroscopic
guidance. The tract was dilated to accommodate a 10 French
multipurpose drain. Cloudy bilious fluid was aspirated from the
gallbladder. The gallbladder was decompressed at the end of the
procedure. Fluid sample sent for culture. Drain was secured to the
skin with silk suture and attached to a gravity bag. Dressing was
placed.
FINDINGS: Gallbladder was mildly distended at the beginning of the procedure.
Drain successfully placed in the gallbladder and the gallbladder was
decompressed at the end of the procedure. Fluoroscopy demonstrates a
stone near the base of the gallbladder.
IMPRESSION: Successful percutaneous cholecystostomy tube placement with
ultrasound and fluoroscopic guidance.

## 2021-01-25 IMAGING — US IR CHOLECYSTOSTOMY
1 series · 3 of 3 positions shown · non-contrast
Comparison: none

INDICATION: 80-year-old with evidence of acute cholecystitis based on imaging.
Request for a percutaneous cholecystostomy tube placement.

[Series 1: ir cholecystostomy · 0.19mm/px · 3 of 3 slices shown]
[im 1/3]
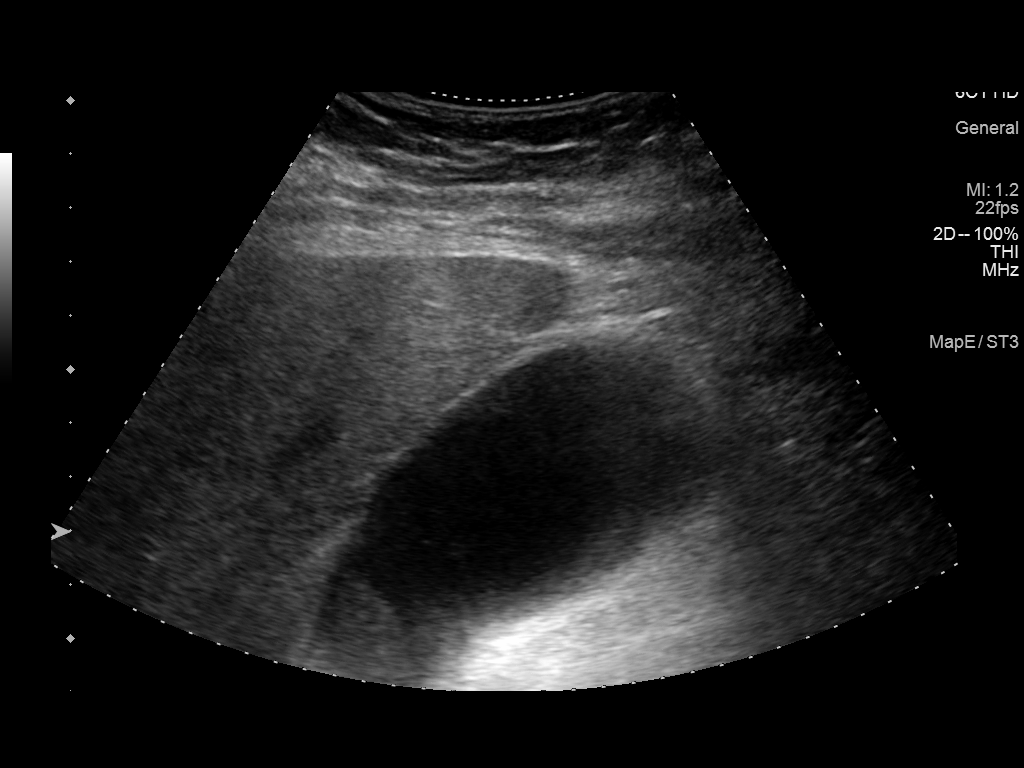
[im 2/3]
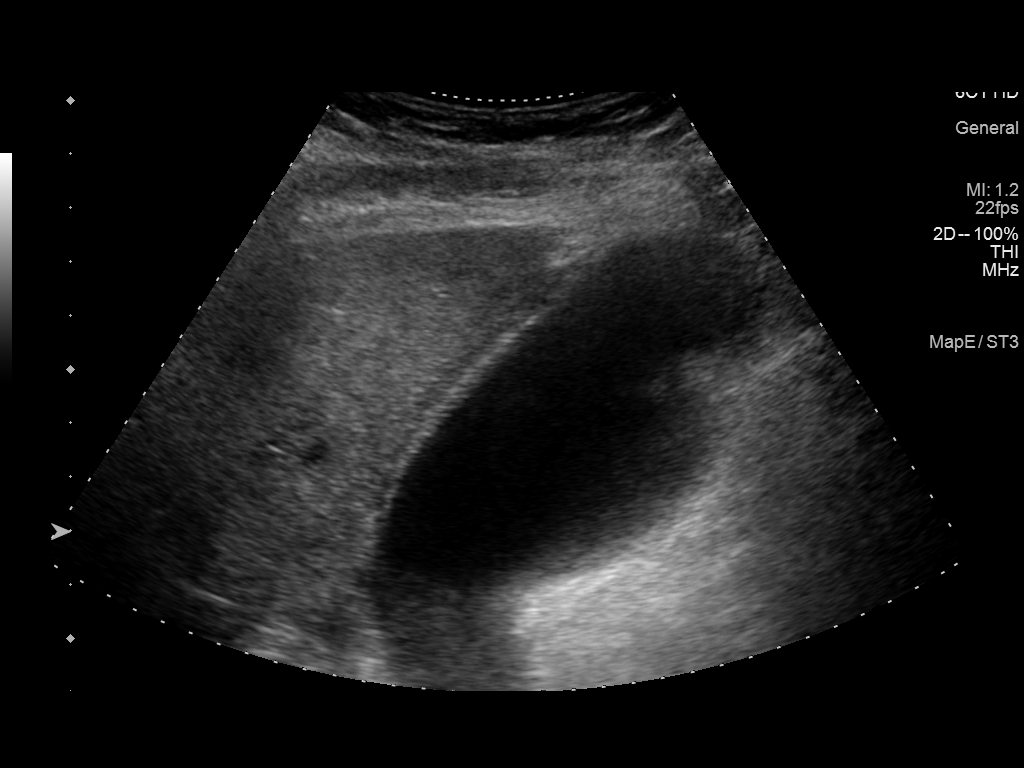
[im 3/3]
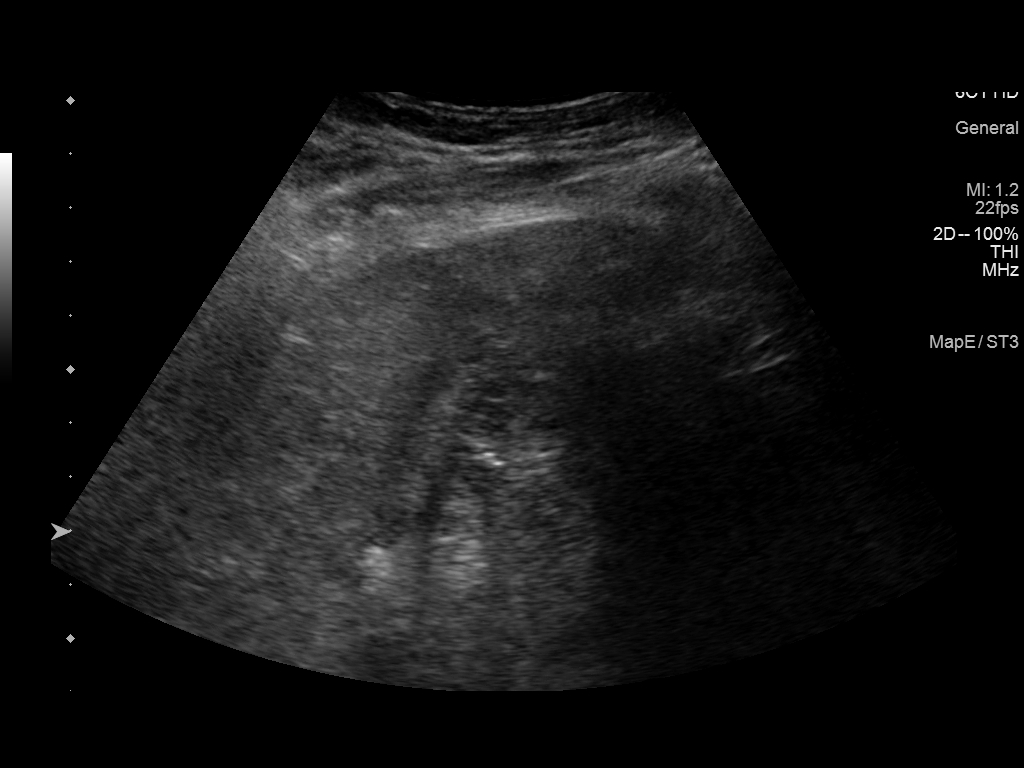

[3 of 3 positions shown; findings below may reference images not displayed]

EXAM:
Percutaneous cholecystostomy tube placement with ultrasound and
fluoroscopic guidance.

MEDICATIONS:
Moderate sedation.  Patient is receiving scheduled IV antibiotics.

ANESTHESIA/SEDATION:
Moderate (conscious) sedation was employed during this procedure. A
total of Versed 1.0mg and fentanyl 50 mcg was administered
intravenously at the order of the provider performing the procedure.

Total intra-service moderate sedation time: 14 minutes.

Patient's level of consciousness and vital signs were monitored
continuously by radiology nurse throughout the procedure under the
supervision of the provider performing the procedure.

FLUOROSCOPY TIME:  Fluoroscopy Time: 12 seconds, 2.4 mGy

CONTRAST:  3 mL Omnipaque 350

COMPLICATIONS:
None immediate.

PROCEDURE:
Informed written consent was obtained from the patient after a
thorough discussion of the procedural risks, benefits and
alternatives. All questions were addressed. A timeout was performed
prior to the initiation of the procedure.

Ultrasound was used to identify the gallbladder. Ultrasound image
was saved for documentation. The right side of the abdomen was
prepped and draped in sterile fashion. Maximal barrier sterile
technique was utilized including caps, mask, sterile gowns, sterile
gloves, sterile drape, hand hygiene and skin antiseptic. Skin was
anesthetized using 1% lidocaine. Small incision was made. Using
ultrasound guidance, an 18 gauge trocar needle was directed into the
gallbladder using a transhepatic approach. A superstiff Amplatz wire
was advanced to the gallbladder using ultrasound and fluoroscopic
guidance. The tract was dilated to accommodate a 10 French
multipurpose drain. Cloudy bilious fluid was aspirated from the
gallbladder. The gallbladder was decompressed at the end of the
procedure. Fluid sample sent for culture. Drain was secured to the
skin with silk suture and attached to a gravity bag. Dressing was
placed.
FINDINGS: Gallbladder was mildly distended at the beginning of the procedure.
Drain successfully placed in the gallbladder and the gallbladder was
decompressed at the end of the procedure. Fluoroscopy demonstrates a
stone near the base of the gallbladder.
IMPRESSION: Successful percutaneous cholecystostomy tube placement with
ultrasound and fluoroscopic guidance.

## 2021-01-25 MED ORDER — LIDOCAINE HCL 1 % IJ SOLN
INTRAMUSCULAR | Status: AC
  Administered 2021-01-25: 20 mL
  Filled 2021-01-25: qty 20

## 2021-01-25 MED ORDER — DILTIAZEM HCL 30 MG PO TABS
30.0000 mg | ORAL_TABLET | Freq: Four times a day (QID) | ORAL | Status: AC
  Administered 2021-01-25 – 2021-01-26 (×6): 30 mg via ORAL
  Filled 2021-01-25 (×6): qty 1

## 2021-01-25 MED ORDER — SODIUM CHLORIDE 0.9% FLUSH
5.0000 mL | Freq: Three times a day (TID) | INTRAVENOUS | Status: DC
  Administered 2021-01-25 – 2021-01-31 (×12): 5 mL

## 2021-01-25 MED ORDER — MIDAZOLAM HCL 2 MG/2ML IJ SOLN
INTRAMUSCULAR | Status: AC
  Filled 2021-01-25: qty 2

## 2021-01-25 MED ORDER — MIDAZOLAM HCL 5 MG/5ML IJ SOLN
INTRAMUSCULAR | Status: DC | PRN
  Administered 2021-01-25 (×2): .5 mg via INTRAVENOUS

## 2021-01-25 MED ORDER — INSULIN ASPART 100 UNIT/ML IJ SOLN
0.0000 [IU] | Freq: Every day | INTRAMUSCULAR | Status: DC
  Administered 2021-01-30: 2 [IU] via SUBCUTANEOUS
  Filled 2021-01-25: qty 1

## 2021-01-25 MED ORDER — FENTANYL CITRATE (PF) 100 MCG/2ML IJ SOLN
INTRAMUSCULAR | Status: AC
  Filled 2021-01-25: qty 2

## 2021-01-25 MED ORDER — OXYCODONE HCL 5 MG PO TABS
5.0000 mg | ORAL_TABLET | ORAL | Status: DC | PRN
  Administered 2021-01-25 – 2021-01-27 (×9): 5 mg via ORAL
  Filled 2021-01-25 (×9): qty 1

## 2021-01-25 MED ORDER — LIDOCAINE HCL 1 % IJ SOLN
INTRAMUSCULAR | Status: AC
  Filled 2021-01-25: qty 20

## 2021-01-25 MED ORDER — INSULIN ASPART 100 UNIT/ML IJ SOLN
0.0000 [IU] | Freq: Three times a day (TID) | INTRAMUSCULAR | Status: DC
  Administered 2021-01-25: 2 [IU] via SUBCUTANEOUS
  Administered 2021-01-26: 1 [IU] via SUBCUTANEOUS
  Administered 2021-01-26 (×2): 2 [IU] via SUBCUTANEOUS
  Administered 2021-01-27: 1 [IU] via SUBCUTANEOUS
  Administered 2021-01-29 – 2021-01-31 (×3): 2 [IU] via SUBCUTANEOUS
  Filled 2021-01-25 (×9): qty 1

## 2021-01-25 MED ORDER — IOHEXOL 350 MG/ML SOLN
3.0000 mL | Freq: Once | INTRAVENOUS | Status: AC | PRN
  Administered 2021-01-25: 3 mL

## 2021-01-25 MED ORDER — FENTANYL CITRATE (PF) 100 MCG/2ML IJ SOLN
INTRAMUSCULAR | Status: DC | PRN
  Administered 2021-01-25 (×2): 25 ug via INTRAVENOUS
  Administered 2021-01-28: 50 ug via INTRAVENOUS

## 2021-01-25 NOTE — Progress Notes (Signed)
[facility]  ROUNDING NOTE   Subjective:   Gregory Dixon is a 80 year old male with past medical history of B12 deficiency, hypertension, hyperlipidemia, and CKD.  Patient presents to the emergency room with complaints of weakness.  Admitted to observation status for Lactic acid increased [E87.20] Weakness [R53.1] RUQ pain [R10.11] Cholecystitis [K81.9]  Patient is known to our clinic from previous admissions.  He is a patient of Dr. Holley Raring, though has not been seen since April 2021.    Currently NPO for procedure Frustrated with restricted renal diet  Creatinine increased to 8.4 Recorded UOP 1.4L in previous 24 hours   Objective:  Vital signs in last 24 hours:  Temp:  [97.5 F (36.4 C)-98.6 F (37 C)] 97.8 F (36.6 C) (10/21 1015) Pulse Rate:  [61-118] 71 (10/21 1152) Resp:  [11-23] 18 (10/21 1152) BP: (88-124)/(51-73) 97/67 (10/21 1152) SpO2:  [89 %-100 %] 95 % (10/21 1152) Weight:  [108.1 kg] 108.1 kg (10/21 1015)  Weight change: 10.6 kg Filed Weights   01/23/21 1304 01/24/21 1618 01/25/21 1015  Weight: 97.5 kg 108.1 kg 108.1 kg    Intake/Output: I/O last 3 completed shifts: In: 1156.7 [P.O.:720; I.V.:86.7; IV Piggyback:350] Out: 1425 [Urine:1425]   Intake/Output this shift:  No intake/output data recorded.  Physical Exam: General: NAD, laying in bed  Head: Normocephalic, atraumatic. Moist oral mucosal membranes  Eyes: Anicteric  Lungs:  Clear to auscultation, normal effort  Heart: Irregular rhythm  Abdomen:  Soft, nontender  Extremities: Trace peripheral edema.  Neurologic: Nonfocal, moving all four extremities  Skin: No lesions       Basic Metabolic Panel: Recent Labs  Lab 01/23/21 1058 01/24/21 0609 01/25/21 0535  NA 134* 137 139  K 4.0 3.9 4.1  CL 102 109 110  CO2 19* 18* 18*  GLUCOSE 121* 117* 127*  BUN 70* 83* 99*  CREATININE 7.73* 7.81* 8.37*  CALCIUM 8.0* 8.3* 8.2*  MG  --  2.0  --   PHOS  --   --  6.7*     Liver  Function Tests: Recent Labs  Lab 01/23/21 1058 01/24/21 0609 01/25/21 0535  AST 682* 303* 146*  ALT 189* 162* 131*  ALKPHOS 32* 31* 28*  BILITOT 0.8 0.6 0.4  PROT 6.2* 5.6* 5.3*  ALBUMIN 2.8* 2.5* 2.2*  2.4*    Recent Labs  Lab 01/23/21 1330  LIPASE 34    No results for input(s): AMMONIA in the last 168 hours.  CBC: Recent Labs  Lab 01/23/21 1058 01/24/21 0609 01/25/21 0535  WBC 6.7 5.3 4.1  NEUTROABS 6.0  --  3.2  HGB 9.6* 8.8* 9.1*  HCT 30.4* 26.6* 27.9*  MCV 90.2 88.7 86.9  PLT 125* 128* 148*     Cardiac Enzymes: No results for input(s): CKTOTAL, CKMB, CKMBINDEX, TROPONINI in the last 168 hours.  BNP: Invalid input(s): POCBNP  CBG: Recent Labs  Lab 01/25/21 1034  [place]*    Microbiology: Results for orders placed or performed during the hospital encounter of 01/23/21  Blood culture (routine x 2)     Status: Abnormal (Preliminary result)   Collection Time: 01/23/21  1:12 PM   Specimen: BLOOD  Result Value Ref Range Status   Specimen Description   Final    BLOOD RIGHT ANTECUBITAL Performed at [facility], [address], [place], [place] [address]    Special Requests   Final    BOTTLES DRAWN AEROBIC AND ANAEROBIC Blood Culture results may not be optimal due to an  inadequate volume of blood received in culture bottles Performed at [facility], [address] [place], [place] [address]    Culture  Setup Time   Final    GRAM NEGATIVE RODS ANAEROBIC BOTTLE ONLY CRITICAL RESULT CALLED TO, READ BACK BY AND VERIFIED WITH: NATHAN BLUE@0422  01/24/21 RH    Culture (A)  Final    KLEBSIELLA OXYTOCA SUSCEPTIBILITIES TO FOLLOW Performed at [facility], [place] [address], [place], [place] [address]    Report Status PENDING  Incomplete  Blood Culture ID Panel (Reflexed)     Status: Abnormal   Collection Time: 01/23/21  1:12 PM  Result Value Ref Range Status   Enterococcus faecalis NOT DETECTED NOT DETECTED Final    Enterococcus Faecium NOT DETECTED NOT DETECTED Final   Listeria monocytogenes NOT DETECTED NOT DETECTED Final   Staphylococcus species NOT DETECTED NOT DETECTED Final   Staphylococcus aureus (BCID) NOT DETECTED NOT DETECTED Final   Staphylococcus epidermidis NOT DETECTED NOT DETECTED Final   Staphylococcus lugdunensis NOT DETECTED NOT DETECTED Final   Streptococcus species NOT DETECTED NOT DETECTED Final   Streptococcus agalactiae NOT DETECTED NOT DETECTED Final   Streptococcus pneumoniae NOT DETECTED NOT DETECTED Final   Streptococcus pyogenes NOT DETECTED NOT DETECTED Final   A.calcoaceticus-baumannii NOT DETECTED NOT DETECTED Final   Bacteroides fragilis NOT DETECTED NOT DETECTED Final   Enterobacterales DETECTED (A) NOT DETECTED Final    Comment: Enterobacterales represent a large order of gram negative bacteria, not a single organism. CRITICAL RESULT CALLED TO, READ BACK BY AND VERIFIED WITH: NATHAN BLUE@0422  01/24/21 RH    Enterobacter cloacae complex NOT DETECTED NOT DETECTED Final   Escherichia coli NOT DETECTED NOT DETECTED Final   Klebsiella aerogenes NOT DETECTED NOT DETECTED Final   Klebsiella oxytoca DETECTED (A) NOT DETECTED Final    Comment: CRITICAL RESULT CALLED TO, READ BACK BY AND VERIFIED WITH: NATHAN BLUE@0422  01/24/21 RH    Klebsiella pneumoniae NOT DETECTED NOT DETECTED Final   Proteus species NOT DETECTED NOT DETECTED Final   Salmonella species NOT DETECTED NOT DETECTED Final   Serratia marcescens NOT DETECTED NOT DETECTED Final   Haemophilus influenzae NOT DETECTED NOT DETECTED Final   Neisseria meningitidis NOT DETECTED NOT DETECTED Final   Pseudomonas aeruginosa NOT DETECTED NOT DETECTED Final   Stenotrophomonas maltophilia NOT DETECTED NOT DETECTED Final   Candida albicans NOT DETECTED NOT DETECTED Final   Candida auris NOT DETECTED NOT DETECTED Final   Candida glabrata NOT DETECTED NOT DETECTED Final   Candida krusei NOT DETECTED NOT DETECTED Final    Candida parapsilosis NOT DETECTED NOT DETECTED Final   Candida tropicalis NOT DETECTED NOT DETECTED Final   Cryptococcus neoformans/gattii NOT DETECTED NOT DETECTED Final   CTX-M ESBL NOT DETECTED NOT DETECTED Final   Carbapenem resistance IMP NOT DETECTED NOT DETECTED Final   Carbapenem resistance KPC NOT DETECTED NOT DETECTED Final   Carbapenem resistance NDM NOT DETECTED NOT DETECTED Final   Carbapenem resist OXA 48 LIKE NOT DETECTED NOT DETECTED Final   Carbapenem resistance VIM NOT DETECTED NOT DETECTED Final    Comment: Performed at [facility], [address] [place], [place] [address]  Blood culture (routine x 2)     Status: None (Preliminary result)   Collection Time: 01/23/21  1:17 PM   Specimen: BLOOD  Result Value Ref Range Status   Specimen Description BLOOD BLOOD RIGHT FOREARM  Final   Special Requests   Final    BOTTLES DRAWN AEROBIC AND ANAEROBIC Blood  Culture results may not be optimal due to an inadequate volume of blood received in culture bottles   Culture   Final    NO GROWTH 2 DAYS Performed at [facility], [address] [place], [place] [address]    Report Status PENDING  Incomplete  Resp Panel by RT-PCR (Flu A&B, Covid) Nasopharyngeal Swab     Status: None   Collection Time: 01/23/21  4:49 PM   Specimen: Nasopharyngeal Swab; Nasopharyngeal(NP) swabs in vial transport medium  Result Value Ref Range Status   SARS Coronavirus 2 by RT PCR NEGATIVE NEGATIVE Final    Comment: (NOTE) SARS-CoV-2 target nucleic acids are NOT DETECTED.  The SARS-CoV-2 RNA is generally detectable in upper respiratory specimens during the acute phase of infection. The lowest concentration of SARS-CoV-2 viral copies this assay can detect is 138 copies/mL. A negative result does not preclude SARS-Cov-2 infection and should not be used as the sole basis for treatment or other patient management decisions. A negative result may occur with  improper specimen  collection/handling, submission of specimen other than nasopharyngeal swab, presence of viral mutation(s) within the areas targeted by this assay, and inadequate number of viral copies(<138 copies/mL). A negative result must be combined with clinical observations, patient history, and epidemiological information. The expected result is Negative.  Fact Sheet for Patients:  EntrepreneurPulse.com.au  Fact Sheet for Healthcare Providers:  IncredibleEmployment.be  This test is no t yet approved or cleared by the [place] FDA and  has been authorized for detection and/or diagnosis of SARS-CoV-2 by FDA under an Emergency Use Authorization (EUA). This EUA will remain  in effect (meaning this test can be used) for the duration of the COVID-19 declaration under Section 564(b)(1) of the Act, 21 U.S.C.section 360bbb-3(b)(1), unless the authorization is terminated  or revoked sooner.       Influenza A by PCR NEGATIVE NEGATIVE Final   Influenza B by PCR NEGATIVE NEGATIVE Final    Comment: (NOTE) The Xpert Xpress SARS-CoV-2/FLU/RSV plus assay is intended as an aid in the diagnosis of influenza from Nasopharyngeal swab specimens and should not be used as a sole basis for treatment. Nasal washings and aspirates are unacceptable for Xpert Xpress SARS-CoV-2/FLU/RSV testing.  Fact Sheet for Patients: EntrepreneurPulse.com.au  Fact Sheet for Healthcare Providers: IncredibleEmployment.be  This test is not yet approved or cleared by the [place] FDA and has been authorized for detection and/or diagnosis of SARS-CoV-2 by FDA under an Emergency Use Authorization (EUA). This EUA will remain in effect (meaning this test can be used) for the duration of the COVID-19 declaration under Section 564(b)(1) of the Act, 21 U.S.C. section 360bbb-3(b)(1), unless the authorization is terminated or revoked.  Performed at [facility], [address] [place], [place] [address]     Coagulation Studies: Recent Labs    01/25/21 0535  LABPROT 16.5*  INR 1.3*    Urinalysis: No results for input(s): COLORURINE, LABSPEC, PHURINE, GLUCOSEU, HGBUR, BILIRUBINUR, KETONESUR, PROTEINUR, UROBILINOGEN, NITRITE, LEUKOCYTESUR in the last 72 hours.  Invalid input(s): APPERANCEUR    Imaging: CT ABDOMEN PELVIS WO CONTRAST  Result Date: 01/23/2021 CLINICAL DATA:  Abdominal pain, acute, nonlocalized. Generalized weakness. Hypotension. EXAM: CT ABDOMEN AND PELVIS WITHOUT CONTRAST TECHNIQUE: Multidetector CT imaging of the abdomen and pelvis was performed following the standard protocol without IV contrast. COMPARISON:  08/09/2012 FINDINGS: Lower chest: Linear scarring or atelectasis at the right lung base. Otherwise no active lower chest finding. Hepatobiliary: Liver appears normal without contrast. 1 cm calcified  stone in the gallbladder neck without definite CT evidence of cholecystitis. Consider right upper quadrant ultrasound if concern persists regarding possible clinical cholecystitis. Pancreas: Normal Spleen: Normal Adrenals/Urinary Tract: Adrenal glands are normal. Multiple bilateral renal cysts. Nonobstructing 6 mm stone in the lower pole of the left kidney. No passing stone or hydronephrosis. No stone in the bladder. Stomach/Bowel: Stomach and small intestine are normal. No evidence of diverticulosis or diverticulitis. Vascular/Lymphatic: Aortic atherosclerosis. No aneurysm. IVC is normal. No retroperitoneal adenopathy. Reproductive: Enlarged prostate. Other: Tiny amount of free fluid in the pelvic cul-de-sac. No free air. Musculoskeletal: Ordinary lower lumbar degenerative changes. IMPRESSION: 1 cm gallstone in the gallbladder neck. No CT evidence of cholecystitis. Consider right upper quadrant ultrasound if there is clinical concern regarding potential cholecystitis. Aortic Atherosclerosis (ICD10-I70.0). Multiple renal  cysts. 6 mm nonobstructing stone in the lower pole of the left kidney. Enlarged prostate. Electronically Signed   By: Nelson Chimes M.D.   On: 01/23/2021 14:58   NM Hepatobiliary Liver Func  Result Date: 01/24/2021 CLINICAL DATA:  Abdominal pain.  Cholelithiasis EXAM: NUCLEAR MEDICINE HEPATOBILIARY IMAGING TECHNIQUE: Sequential images of the abdomen were obtained out to 60 minutes following intravenous administration of radiopharmaceutical. RADIOPHARMACEUTICALS:  5.6 mCi Tc-24m  Choletec IV COMPARISON:  CT 01/23/2021 FINDINGS: Prompt clearance of radiotracer from blood pool and homogeneous uptake in liver. Counts are evident in the small bowel by 50 minutes. At 90 minutes, IV morphine was administered. Gallbladder failed to contract after morphine augmentation. IMPRESSION: Non filling of the gall bladder is consistent with cystic duct obstruction (acute cholecystitis). These results will be called to the ordering clinician or representative by the Radiologist Assistant, and communication documented in the PACS or [employer]. Electronically Signed   By: Suzy Bouchard M.D.   On: 01/24/2021 15:59   DG Chest Port 1 View  Result Date: 01/23/2021 CLINICAL DATA:  Weakness Increase lactic acid EXAM: PORTABLE CHEST 1 VIEW COMPARISON:  None. FINDINGS: Heart size within normal limits. No pulmonary vascular congestion. Right hemidiaphragm is elevated. Lungs are clear. IMPRESSION: No acute cardiopulmonary process. Electronically Signed   By: Miachel Roux M.D.   On: 01/23/2021 16:32   US Abdomen Limited RUQ (LIVER/GB)  Result Date: 01/23/2021 CLINICAL DATA:  Right upper quadrant pain EXAM: ULTRASOUND ABDOMEN LIMITED RIGHT UPPER QUADRANT COMPARISON:  CT 01/23/2021 FINDINGS: Gallbladder: Sludge within the gallbladder. 16 mm non mobile stone at the gallbladder neck. Slight increased wall thickness of 3.7 mm but negative sonographic Murphy. Common bile duct: Diameter: 5.2 mm Liver: No focal lesion identified.  Within normal limits in parenchymal echogenicity. Portal vein is patent on color Doppler imaging with normal direction of blood flow towards the liver. Other: Right kidney cortex appears slightly echogenic. IMPRESSION: 1. Sludge in the gallbladder with non mobile stone at the gallbladder neck with slight increased wall thickness but negative sonographic Murphy. Findings are indeterminate for cholecystitis, consider correlation with nuclear medicine hepatobiliary imaging 2. Right kidney cortex appears slightly echogenic suggesting medical renal disease, correlate with appropriate laboratory values Electronically Signed   By: Donavan Foil M.D.   On: 01/23/2021 15:45     Medications:    sodium chloride 50 mL/hr at 01/24/21 1146   diltiazem (CARDIZEM) infusion 5 mg/hr (01/25/21 1045)   piperacillin-tazobactam (ZOSYN)  IV 2.25 g (01/25/21 0635)    carvedilol  3.125 mg Oral BID WC   fentaNYL       heparin  5,000 Units Subcutaneous Q8H   insulin aspart  0-5 Units Subcutaneous QHS  insulin aspart  0-9 Units Subcutaneous TID WC   isosorbide mononitrate  30 mg Oral QPM   lidocaine       midazolam       multivitamin with minerals  1 tablet Oral Daily   pantoprazole  40 mg Oral Daily   sodium bicarbonate  650 mg Oral TID   sodium chloride flush  5 mL Intracatheter Q8H   vitamin B-12  1,000 mcg Oral Daily   fentaNYL, midazolam, ondansetron **OR** ondansetron (ZOFRAN) IV  Assessment/ Plan:  Gregory Dixon is a 80 y.o.  male with past medical history of B12 deficiency, hypertension, hyperlipidemia, and CKD.  Patient presents to the emergency room with complaints of weakness.  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  Acute kidney injury secondary to prerenal azotemia due to progressive diarrhea and  poor appetite. Losartan held Abdominal ultrasound showed slightly echogenic right kidney Renal function continues to decline. Discussed this with patient and the possibility that we may need to begin hemodialysis and transition to peritoneal dialysis at a later date, due to cholecystitis. Patient was fixated on renal diet and lack of options. Will revisit this daily.  Lab Results  Component Value Date   CREATININE 8.37 (H) 01/25/2021   CREATININE 7.81 (H) 01/24/2021   CREATININE 7.73 (H) 01/23/2021    Intake/Output Summary (Last 24 hours) at 01/25/2021 1158 Last data filed at 01/25/2021 0635 Gross per 24 hour  Intake 856.74 ml  Output 1075 ml  Net -218.26 ml    2. Anemia of chronic kidney disease Lab Results  Component Value Date   HGB 9.1 (L) 01/25/2021  Iron supplement twice daily outpatient Iron studies indicate anemia of chronic disease Hemoglobin at target  3. Secondary Hyperparathyroidism:  Lab Results  Component Value Date   CALCIUM 8.2 (L) 01/25/2021   PHOS 6.7 (H) 01/25/2021  Calcium and phosphorus not at goal.  4.  Hypertension with chronic kidney disease.  Home regimen includes carvedilol, hydralazine laying, and isosorbide.  Currently receiving carvedilol, and isosorbide. BP currently 97/67  5.  Acute cholecystitis.  Abdominal ultrasound shows gallbladder sludge and nonmobile stone in gallbladder neck. HIDA scan consistent with cystic duct obstruction. Planned placement for percutaneous cholecystostomy tube prior to cholecystectomy.    LOS: 1 Alysson Geist 10/21/202211:58 AM

## 2021-01-25 NOTE — Progress Notes (Signed)
Patient clinically stable post Cholecystostomy tube placement per Dr Anselm Pancoast, tolerated well with stable vitals pre and post procedure. Received Versed 1 mg along with Fentanyl 50 mcg IV for procedure. Report given to [facility] in specials post procedure.

## 2021-01-25 NOTE — Procedures (Signed)
Interventional Radiology Procedure:   Indications: Acute cholecystitis.  Obstruction of cystic duct on HIDA  Procedure: Percutaneous cholecystostomy tube placement  Findings: Mildly distended gallbladder with wall irregularity.  Aspirated cloudy bile.  Gallbladder decompressed at end of procedure.   Complications: No immediate complications noted.     EBL: Minimal  Plan: Sending fluid for culture.  Follow output.  Keep drain to gravity bag.     Marcos Peloso R. Anselm Pancoast, MD  Pager: [phone]

## 2021-01-25 NOTE — Consult Note (Signed)
Chief Complaint: Patient was seen in consultation today for cholecystitis.  Referring Physician(s): Edison Simon  Patient Status: [facility] - In-pt  History of Present Illness: Gregory Dixon is a 80 y.o. male with recent history of chills and difficulty ambulating.  Patient presented with an elevated lactic acid level and slightly abnormal liver enzymes.  Imaging studies have demonstrated obstruction of the cystic duct on HIDA scan.  Patient does not complain of abdominal pain.  Patient's been evaluated by general surgery and a percutaneous cholecystostomy tube has been requested.  Currently, the patient is asymptomatic.  He continues to have no abdominal pain.  Comorbidities include new onset of atrial fibrillation and acute on chronic kidney disease.  Past Medical History:  Diagnosis Date   Actinic keratosis    Basal cell carcinoma 10/21/2017   left lateral neck, excised 02/09/2018   Basal cell carcinoma (BCC) 05/12/2019   right posterior ear, excised 05/31/2019   Blood transfusion without reported diagnosis    Diabetes mellitus without complication [facility])     Past Surgical History:  Procedure Laterality Date   COLONOSCOPY WITH PROPOFOL N/A 04/29/2019   Procedure: COLONOSCOPY WITH PROPOFOL;  Surgeon: Jonathon Bellows, MD;  Location: [facility] ENDOSCOPY;  Service: Gastroenterology;  Laterality: N/A;   ESOPHAGOGASTRODUODENOSCOPY (EGD) WITH PROPOFOL N/A 04/29/2019   Procedure: ESOPHAGOGASTRODUODENOSCOPY (EGD) WITH PROPOFOL;  Surgeon: Jonathon Bellows, MD;  Location: [facility] ENDOSCOPY;  Service: Gastroenterology;  Laterality: N/A;    Allergies: Aspirin and Codeine  Medications: Prior to Admission medications   Medication Sig Start Date End Date Taking? Authorizing Provider  carvedilol (COREG) 3.125 MG tablet Take 1 tablet (3.125 mg total) by mouth 2 (two) times daily with a meal. 11/25/20  Yes Wieting, Richard, MD  fenofibrate 160 MG tablet Take 160 mg by mouth at bedtime.   Yes [provider]  ferrous sulfate 325 (65 FE) MG tablet Take 325 mg by mouth 2 (two) times daily with a meal.   Yes [provider]  hydrALAZINE (APRESOLINE) 50 MG tablet Take 1 tablet (50 mg total) by mouth 2 (two) times daily. 11/25/20  Yes Wieting, Richard, MD  insulin NPH-regular Human (70-30) 100 UNIT/ML injection Inject 55 Units into the skin 2 (two) times daily with a meal.   Yes [provider]  isosorbide mononitrate (IMDUR) 30 MG 24 hr tablet Take 30 mg by mouth at bedtime. 03/14/19  Yes [provider]  losartan (COZAAR) 25 MG tablet Take 25 mg by mouth daily.   Yes [provider]  Multiple Vitamin (MULTIVITAMIN) capsule Take 1 capsule by mouth daily.   Yes [provider]  olmesartan-hydrochlorothiazide (BENICAR HCT) 40-25 MG tablet Take 1 tablet by mouth daily. 01/21/21  Yes [provider]  terazosin (HYTRIN) 10 MG capsule Take 10 mg by mouth at bedtime. 03/13/19  Yes [provider]  vitamin B-12 (CYANOCOBALAMIN) 1000 MCG tablet Take 1 tablet (1,000 mcg total) by mouth daily. 04/29/19  Yes Wieting, Richard, MD  olmesartan (BENICAR) 40 MG tablet Take 1 tablet (40 mg total) by mouth daily. Patient not taking: Reported on 01/23/2021 11/25/20   Loletha Grayer, MD  pantoprazole (PROTONIX) 40 MG tablet Take 1 tablet (40 mg total) by mouth daily. 11/25/20 12/25/20  Loletha Grayer, MD     History reviewed. No pertinent family history.  Social History   Socioeconomic History   Marital status: Married    Spouse name: Not on file   Number of children: Not on file   Years of education: Not on  file   Highest education level: Not on file  Occupational History   Not on file  Tobacco Use   Smoking status: Former   Smokeless tobacco: Never  Vaping Use   Vaping Use: Never used  Substance and Sexual Activity   Alcohol use: Not Currently    Comment: rare once a year   Drug use: Never   Sexual activity: Not on file  Other  Topics Concern   Not on file  Social History Narrative   Not on file   Social Determinants of Health   Financial Resource Strain: Not on file  Food Insecurity: Not on file  Transportation Needs: Not on file  Physical Activity: Not on file  Stress: Not on file  Social Connections: Not on file     Review of Systems  Constitutional:  Positive for chills.  Gastrointestinal: Negative.    Vital Signs: BP 124/66   Pulse 92   Temp 97.8 F (36.6 C) (Oral)   Resp 16   Ht 6\' 6"  (1.981 m)   Wt 108.1 kg   SpO2 98%   BMI 27.54 kg/m   Physical Exam Constitutional:      Appearance: Normal appearance.  Cardiovascular:     Rate and Rhythm: Normal rate. Rhythm irregular.  Pulmonary:     Effort: Pulmonary effort is normal.     Breath sounds: Normal breath sounds.  Abdominal:     General: Abdomen is flat. Bowel sounds are normal. There is no distension.     Palpations: Abdomen is soft.     Tenderness: There is no abdominal tenderness.  Neurological:     Mental Status: He is alert.    Imaging: CT ABDOMEN PELVIS WO CONTRAST  Result Date: 01/23/2021 CLINICAL DATA:  Abdominal pain, acute, nonlocalized. Generalized weakness. Hypotension. EXAM: CT ABDOMEN AND PELVIS WITHOUT CONTRAST TECHNIQUE: Multidetector CT imaging of the abdomen and pelvis was performed following the standard protocol without IV contrast. COMPARISON:  08/09/2012 FINDINGS: Lower chest: Linear scarring or atelectasis at the right lung base. Otherwise no active lower chest finding. Hepatobiliary: Liver appears normal without contrast. 1 cm calcified stone in the gallbladder neck without definite CT evidence of cholecystitis. Consider right upper quadrant ultrasound if concern persists regarding possible clinical cholecystitis. Pancreas: Normal Spleen: Normal Adrenals/Urinary Tract: Adrenal glands are normal. Multiple bilateral renal cysts. Nonobstructing 6 mm stone in the lower pole of the left kidney. No passing stone or  hydronephrosis. No stone in the bladder. Stomach/Bowel: Stomach and small intestine are normal. No evidence of diverticulosis or diverticulitis. Vascular/Lymphatic: Aortic atherosclerosis. No aneurysm. IVC is normal. No retroperitoneal adenopathy. Reproductive: Enlarged prostate. Other: Tiny amount of free fluid in the pelvic cul-de-sac. No free air. Musculoskeletal: Ordinary lower lumbar degenerative changes. IMPRESSION: 1 cm gallstone in the gallbladder neck. No CT evidence of cholecystitis. Consider right upper quadrant ultrasound if there is clinical concern regarding potential cholecystitis. Aortic Atherosclerosis (ICD10-I70.0). Multiple renal cysts. 6 mm nonobstructing stone in the lower pole of the left kidney. Enlarged prostate. Electronically Signed   By: Nelson Chimes M.D.   On: 01/23/2021 14:58   NM Hepatobiliary Liver Func  Result Date: 01/24/2021 CLINICAL DATA:  Abdominal pain.  Cholelithiasis EXAM: NUCLEAR MEDICINE HEPATOBILIARY IMAGING TECHNIQUE: Sequential images of the abdomen were obtained out to 60 minutes following intravenous administration of radiopharmaceutical. RADIOPHARMACEUTICALS:  5.6 mCi Tc-17m  Choletec IV COMPARISON:  CT 01/23/2021 FINDINGS: Prompt clearance of radiotracer from blood pool and homogeneous uptake in liver. Counts are evident in the small bowel  by 50 minutes. At 90 minutes, IV morphine was administered. Gallbladder failed to contract after morphine augmentation. IMPRESSION: Non filling of the gall bladder is consistent with cystic duct obstruction (acute cholecystitis). These results will be called to the ordering clinician or representative by the Radiologist Assistant, and communication documented in the PACS or [employer]. Electronically Signed   By: Suzy Bouchard M.D.   On: 01/24/2021 15:59   DG Chest Port 1 View  Result Date: 01/23/2021 CLINICAL DATA:  Weakness Increase lactic acid EXAM: PORTABLE CHEST 1 VIEW COMPARISON:  None. FINDINGS: Heart size  within normal limits. No pulmonary vascular congestion. Right hemidiaphragm is elevated. Lungs are clear. IMPRESSION: No acute cardiopulmonary process. Electronically Signed   By: Miachel Roux M.D.   On: 01/23/2021 16:32   US Abdomen Limited RUQ (LIVER/GB)  Result Date: 01/23/2021 CLINICAL DATA:  Right upper quadrant pain EXAM: ULTRASOUND ABDOMEN LIMITED RIGHT UPPER QUADRANT COMPARISON:  CT 01/23/2021 FINDINGS: Gallbladder: Sludge within the gallbladder. 16 mm non mobile stone at the gallbladder neck. Slight increased wall thickness of 3.7 mm but negative sonographic Murphy. Common bile duct: Diameter: 5.2 mm Liver: No focal lesion identified. Within normal limits in parenchymal echogenicity. Portal vein is patent on color Doppler imaging with normal direction of blood flow towards the liver. Other: Right kidney cortex appears slightly echogenic. IMPRESSION: 1. Sludge in the gallbladder with non mobile stone at the gallbladder neck with slight increased wall thickness but negative sonographic Murphy. Findings are indeterminate for cholecystitis, consider correlation with nuclear medicine hepatobiliary imaging 2. Right kidney cortex appears slightly echogenic suggesting medical renal disease, correlate with appropriate laboratory values Electronically Signed   By: Donavan Foil M.D.   On: 01/23/2021 15:45    Labs:  CBC: Recent Labs    12/28/20 0957 01/23/21 1058 01/24/21 0609 01/25/21 0535  WBC 4.3 6.7 5.3 4.1  HGB 8.4* 9.6* 8.8* 9.1*  HCT 26.7* 30.4* 26.6* 27.9*  PLT 168 125* 128* 148*    COAGS: Recent Labs    01/25/21 0535  INR 1.3*    BMP: Recent Labs    11/30/20 0842 01/23/21 1058 01/24/21 0609 01/25/21 0535  NA 139 134* 137 139  K 4.1 4.0 3.9 4.1  CL 113* 102 109 110  CO2 20* 19* 18* 18*  GLUCOSE 117* 121* 117* 127*  BUN 61* 70* 83* 99*  CALCIUM 8.0* 8.0* 8.3* 8.2*  CREATININE 4.69* 7.73* 7.81* 8.37*  GFRNONAA 12* 7* 6* 6*    LIVER FUNCTION TESTS: Recent Labs     11/30/20 0842 01/23/21 1058 01/24/21 0609 01/25/21 0535  BILITOT 0.3 0.8 0.6 0.4  AST 20 682* 303* 146*  ALT 14 189* 162* 131*  ALKPHOS 20* 32* 31* 28*  PROT 5.3* 6.2* 5.6* 5.3*  ALBUMIN 2.8* 2.8* 2.5* 2.2*  2.4*    TUMOR MARKERS: No results for input(s): AFPTM, CEA, CA199, CHROMGRNA in the last 8760 hours.  Assessment and Plan:  80 year old with evidence of acute cholecystitis based on imaging.  Atypical presentation without right abdominal pain.  Due to patient's comorbidities including acute on chronic kidney disease, general surgery is requesting a percutaneous cholecystostomy tube prior to cholecystectomy.  Risks and benefits discussed with the patient including, but not limited to bleeding, infection, gallbladder perforation, bile leak, sepsis or even death.  All of the patient's questions were answered, patient is agreeable to proceed. Consent signed and in chart.   Thank you for this interesting consult.  I greatly enjoyed meeting Gregory Dixon and look forward  to participating in their care.  A copy of this report was sent to the requesting provider on this date.  Electronically Signed: Burman Riis, MD 01/25/2021, 11:00 AM   I spent a total of  15 minutes     in face to face in clinical consultation, greater than 50% of which was counseling/coordinating care for acute cholecystitis

## 2021-01-25 NOTE — Progress Notes (Signed)
[facility] SURGICAL PROGRESS NOTE (cpt [phone])  Hospital Day(s): 1.   Interval History: Patient seen and examined, no acute events or new complaints overnight. Patient reports he continues to interestingly remain without abdominal pain. He denies fever, chills, nausea. He remains without leukocytosis with WBC at 4.1. Unfortunately, is renal function continues to worsen; sCr - 8.37; UO - 1.4L. LFTs are improving. No significant electrolyte derangements. Nephrology is following; potential for CAPD as an outpatient. He did have Afib with RVR and now on continuous Cardizem drip. He continues on Zosyn. HIDA yesterday (1/20) was concerning for acute cholecystitis. Plan for percutaneous cholecystostomy tube with IR tube.   Review of Systems:  Constitutional: denies fever, chills  HEENT: denies cough or congestion  Respiratory: denies any shortness of breath  Cardiovascular: denies chest pain or palpitations  Gastrointestinal: denies abdominal pain, N/V, or diarrhea/and bowel function as per interval history Genitourinary: denies burning with urination or urinary frequency  Vital signs in last 24 hours: [min-max] current  Temp:  [97.5 F (36.4 C)-98.6 F (37 C)] 97.5 F (36.4 C) (10/21 0401) Pulse Rate:  [76-128] 76 (10/21 0401) Resp:  [16-20] 20 (10/21 0401) BP: (86-110)/(47-75) 104/62 (10/21 0401) SpO2:  [98 %-99 %] 98 % (10/21 0401) Weight:  [108.1 kg] 108.1 kg (10/20 1618)     Height: 6\' 6"  (198.1 cm) Weight: 108.1 kg BMI (Calculated): 27.55   Intake/Output last 2 shifts:  10/20 0701 - 10/21 0700 In: 856.7 [P.O.:720; I.V.:86.7; IV Piggyback:50] Out: 9326 [Urine:1425]   Physical Exam:  Constitutional: alert, cooperative and no distress  HENT: normocephalic without obvious abnormality  Eyes: PERRL, EOM's grossly intact and symmetric  Neuro: CN II - XII grossly intact and symmetric without deficit  Respiratory: breathing non-labored at rest  Cardiovascular: regular rate  and sinus rhythm  Gastrointestinal: soft, non-tender, and non-distended, no rebound/guarding. Negative Murphy's sign      Labs:  CBC Latest Ref Rng & Units 01/25/2021 01/24/2021 01/23/2021  WBC 4.0 - 10.5 K/uL 4.1 5.3 6.7  Hemoglobin 13.0 - 17.0 g/dL 9.1(L) 8.8(L) 9.6(L)  Hematocrit 39.0 - 52.0 % 27.9(L) 26.6(L) 30.4(L)  Platelets 150 - 400 K/uL 148(L) 128(L) 125(L)   CMP Latest Ref Rng & Units 01/25/2021 01/24/2021 01/23/2021  Glucose 70 - 99 mg/dL 127(H) 117(H) 121(H)  BUN 8 - 23 mg/dL 99(H) 83(H) 70(H)  Creatinine 0.61 - 1.24 mg/dL 8.37(H) 7.81(H) 7.73(H)  Sodium 135 - 145 mmol/L 139 137 134(L)  Potassium 3.5 - 5.1 mmol/L 4.1 3.9 4.0  Chloride 98 - 111 mmol/L 110 109 102  CO2 22 - 32 mmol/L 18(L) 18(L) 19(L)  Calcium 8.9 - 10.3 mg/dL 8.2(L) 8.3(L) 8.0(L)  Total Protein 6.5 - 8.1 g/dL 5.3(L) 5.6(L) 6.2(L)  Total Bilirubin 0.3 - 1.2 mg/dL 0.4 0.6 0.8  Alkaline Phos 38 - 126 U/L 28(L) 31(L) 32(L)  AST 15 - 41 U/L 146(H) 303(H) 682(H)  ALT 0 - 44 U/L 131(H) 162(H) 189(H)    Imaging studies: No new pertinent imaging studies   Assessment/Plan: (ICD-10's: K34.20) 80 y.o. male with gallstone in gallbladder neck with cholecystitis confirmed by HIDA on 71/24, complicated by pertinent comorbidities including acute on chronic renal injury, chronic anemia, and weakness.   - NPO for planned IR procedure; okay to resume diet after  - Again, given his comorbid conditions and worsening renal function he is a sub-optimal surgical candidate. Appreciate IR assistance with placement of percutaneous cholecystostomy tube.   - Continue IV Abx (Zosyn)  - Monitor abdominal examination -  Monitor renal function; nephrology following; We can consider CAPD placement after acute insult/infection resolved - Pain control prn; antiemetics prn - Mobilization as tolerated - Further management per primary service; we will follow   All of the above findings and recommendations were discussed with the  patient, and the medical team, and all of patient's questions were answered to his expressed satisfaction.  -- Gregory Simon, PA-C [facility] 01/25/2021, 8:05 AM [phone] M-F: 7am - 4pm

## 2021-01-25 NOTE — Progress Notes (Signed)
PROGRESS NOTE    Gregory Dixon  AOZ:[id number] DOB: Jan 20, 1941 DOA: 01/23/2021 PCP: Valera Castle, MD    Brief Narrative:  80 year old male past medical history for chronic kidney disease stage IV, B12 deficiency, hypertension and dyslipidemia who presented with weakness.  Reported 3-4 days of chills, difficulty ambulating, no chest pain, dyspnea or abdominal pain.   Abdominal ultrasound with sludge in the gallbladder, nonmobile stone at the gallbladder neck with slight increase wall thickness but negative sonographic Murphy sign.   Patient has been placed on intravenous antibiotics, surgery was consulted. Recommendations to get a HIDA scan.  HIDA scan positive.  Considering patient's medical comorbidities and high surgical risk and the recommendation was made for a percutaneous cholecystostomy.  This was placed 10/21.  Patient tolerated without issue.   Patient developed new onset atrial fibrillation with rapid ventricular response, placed on diltiazem drip.  Diltiazem drip will be weaned off and transition to p.o. Cardizem on 10/21  Patient also has deteriorating kidney function.  AKI on CKD stage IV.  Will likely need initiation of dialysis.  Tentative plan is to place a peritoneal dialysis catheter intraoperatively when patient stable for cholecystectomy.  However this may be 2 months from now and patient's kidney function may deteriorate before that time.  Nephrology following and will discuss with patient regarding initiation of temporary hemodialysis.   Assessment & Plan:   Principal Problem:   Cholelithiasis Active Problems:   Benign prostatic hyperplasia   Chronic kidney disease   Diabetic peripheral neuropathy associated with type 2 diabetes mellitus (HCC)   Hyperlipidemia   Anemia associated with chronic renal failure   Weakness   Elevated LFTs   Transaminitis   Cholecystitis  Cholecystitis Positive HIDA scan General surgery consulted, recommend percutaneous  cholecystostomy PERC Coley placed 10/21 Plan: Maintain drain to gravity As needed pain control Antibiotic therapy with Zosyn  AKI on CKD stage V Non-anion gap metabolic acidosis Deteriorating kidney function Patient will likely need some form of renal replacement therapy Initially plan was for PD catheter placement during cholecystectomy Kidney function may deteriorate before that time requiring initiation of temporary hemodialysis Discussed with nephrology Plan: Continue low rate NS Continue sodium bicarb oral Avoid hypotension and nephrotoxins Nephrology follow-up regarding initiation of temporary hemodialysis  New onset atrial fibrillation Noted on 10/20 Placed on diltiazem for good rate control Plan: DC diltiazem drip Start p.o. diltiazem 30 mg every 6 hours Continue low-dose Coreg Telemetry monitoring Defer anticoagulation for now Consider starting on 10/22  Essential hypertension Coreg resumed Imdur resumed  GERD PPI  Chronic anemia/MGUS No indication for transfusion Continue to follow cell counts closely  DVT prophylaxis: SCD Code Status: Full Family Communication: None today Disposition Plan: Status is: Inpatient  Remains inpatient appropriate because: Deteriorating kidney function.  New onset atrial fibrillation.  Acute cholecystitis requiring cholecystostomy tube placement.  Disposition plan pending.       Level of care: Progressive Cardiac  Consultants:  General surgery Interventional radiology Nephrology  Procedures:  Percutaneous cholecystostomy 10/21  Antimicrobials: Zosyn   Subjective: Patient seen and examined.  Upset about restrictive diet.  No pain complaints.  Objective: Vitals:   01/25/21 1152 01/25/21 1200 01/25/21 1215 01/25/21 1310  BP: 97/67 (!) 105/50 109/77 113/69  Pulse: 71 74 71 65  Resp: 18 (!) 21 (!) 25 16  Temp:    98 F (36.7 C)  TempSrc:      SpO2: 95% 94% 94% 96%  Weight:      Height:  Intake/Output Summary (Last 24 hours) at 01/25/2021 1350 Last data filed at 01/25/2021 0635 Gross per 24 hour  Intake 856.74 ml  Output 1075 ml  Net -218.26 ml   Filed Weights   01/23/21 1304 01/24/21 1618 01/25/21 1015  Weight: 97.5 kg 108.1 kg 108.1 kg    Examination:  General exam: Appears calm and comfortable  Respiratory system: Clear to auscultation. Respiratory effort normal. Cardiovascular system: S1-S2, regular rate, irregular rhythm, no murmurs, 1+ pedal edema  gastrointestinal system: Soft, NT/ND, normal bowel sounds Central nervous system: Alert and oriented. No focal neurological deficits. Extremities: Symmetric 5 x 5 power. Skin: No rashes, lesions or ulcers Psychiatry: Judgement and insight appear normal. Mood & affect appropriate.     Data Reviewed: I have personally reviewed following labs and imaging studies  CBC: Recent Labs  Lab 01/23/21 1058 01/24/21 0609 01/25/21 0535  WBC 6.7 5.3 4.1  NEUTROABS 6.0  --  3.2  HGB 9.6* 8.8* 9.1*  HCT 30.4* 26.6* 27.9*  MCV 90.2 88.7 86.9  PLT 125* 128* 188*   Basic Metabolic Panel: Recent Labs  Lab 01/23/21 1058 01/24/21 0609 01/25/21 0535  NA 134* 137 139  K 4.0 3.9 4.1  CL 102 109 110  CO2 19* 18* 18*  GLUCOSE 121* 117* 127*  BUN 70* 83* 99*  CREATININE 7.73* 7.81* 8.37*  CALCIUM 8.0* 8.3* 8.2*  MG  --  2.0  --   PHOS  --   --  6.7*   GFR: Estimated Creatinine Clearance: 9.3 mL/min (A) (by C-G formula based on SCr of 8.37 mg/dL (H)). Liver Function Tests: Recent Labs  Lab 01/23/21 1058 01/24/21 0609 01/25/21 0535  AST 682* 303* 146*  ALT 189* 162* 131*  ALKPHOS 32* 31* 28*  BILITOT 0.8 0.6 0.4  PROT 6.2* 5.6* 5.3*  ALBUMIN 2.8* 2.5* 2.2*  2.4*   Recent Labs  Lab 01/23/21 1330  LIPASE 34   No results for input(s): AMMONIA in the last 168 hours. Coagulation Profile: Recent Labs  Lab 01/25/21 0535  INR 1.3*   Cardiac Enzymes: No results for input(s): CKTOTAL, CKMB,  CKMBINDEX, TROPONINI in the last 168 hours. BNP (last 3 results) No results for input(s): PROBNP in the last 8760 hours. HbA1C: No results for input(s): HGBA1C in the last 72 hours. CBG: Recent Labs  Lab 01/25/21 1034 01/25/21 1311  GLUCAP 109* 145*   Lipid Profile: No results for input(s): CHOL, HDL, LDLCALC, TRIG, CHOLHDL, LDLDIRECT in the last 72 hours. Thyroid Function Tests: Recent Labs    01/24/21 0609  TSH 2.713   Anemia Panel: Recent Labs    01/23/21 1058 01/24/21 0609  VITAMINB12  --  1,244*  FERRITIN 1,157*  --   TIBC 241*  --   IRON 11*  --    Sepsis Labs: Recent Labs  Lab 01/23/21 1312 01/23/21 1751  LATICACIDVEN 2.7* 1.1    Recent Results (from the past 240 hour(s))  Blood culture (routine x 2)     Status: Abnormal (Preliminary result)   Collection Time: 01/23/21  1:12 PM   Specimen: BLOOD  Result Value Ref Range Status   Specimen Description   Final    BLOOD RIGHT ANTECUBITAL Performed at [facility], [address], [place], [place] [address]    Special Requests   Final    BOTTLES DRAWN AEROBIC AND ANAEROBIC Blood Culture results may not be optimal due to an inadequate volume of blood received in culture bottles Performed at [facility] [address] [place], [place] [address]    Culture  Setup Time   Final    GRAM NEGATIVE RODS ANAEROBIC BOTTLE ONLY CRITICAL RESULT CALLED TO, READ BACK BY AND VERIFIED WITH: NATHAN BLUE@0422  01/24/21 RH    Culture (A)  Final    KLEBSIELLA OXYTOCA SUSCEPTIBILITIES TO FOLLOW Performed at [facility], [place] [address] [place], [place] [address]    Report Status PENDING  Incomplete  Blood Culture ID Panel (Reflexed)     Status: Abnormal   Collection Time: 01/23/21  1:12 PM  Result Value Ref Range Status   Enterococcus faecalis NOT DETECTED NOT DETECTED Final   Enterococcus Faecium NOT DETECTED NOT DETECTED Final   Listeria monocytogenes NOT DETECTED NOT DETECTED Final    Staphylococcus species NOT DETECTED NOT DETECTED Final   Staphylococcus aureus (BCID) NOT DETECTED NOT DETECTED Final   Staphylococcus epidermidis NOT DETECTED NOT DETECTED Final   Staphylococcus lugdunensis NOT DETECTED NOT DETECTED Final   Streptococcus species NOT DETECTED NOT DETECTED Final   Streptococcus agalactiae NOT DETECTED NOT DETECTED Final   Streptococcus pneumoniae NOT DETECTED NOT DETECTED Final   Streptococcus pyogenes NOT DETECTED NOT DETECTED Final   A.calcoaceticus-baumannii NOT DETECTED NOT DETECTED Final   Bacteroides fragilis NOT DETECTED NOT DETECTED Final   Enterobacterales DETECTED (A) NOT DETECTED Final    Comment: Enterobacterales represent a large order of gram negative bacteria, not a single organism. CRITICAL RESULT CALLED TO, READ BACK BY AND VERIFIED WITH: NATHAN BLUE@0422  01/24/21 RH    Enterobacter cloacae complex NOT DETECTED NOT DETECTED Final   Escherichia coli NOT DETECTED NOT DETECTED Final   Klebsiella aerogenes NOT DETECTED NOT DETECTED Final   Klebsiella oxytoca DETECTED (A) NOT DETECTED Final    Comment: CRITICAL RESULT CALLED TO, READ BACK BY AND VERIFIED WITH: NATHAN BLUE@0422  01/24/21 RH    Klebsiella pneumoniae NOT DETECTED NOT DETECTED Final   Proteus species NOT DETECTED NOT DETECTED Final   Salmonella species NOT DETECTED NOT DETECTED Final   Serratia marcescens NOT DETECTED NOT DETECTED Final   Haemophilus influenzae NOT DETECTED NOT DETECTED Final   Neisseria meningitidis NOT DETECTED NOT DETECTED Final   Pseudomonas aeruginosa NOT DETECTED NOT DETECTED Final   Stenotrophomonas maltophilia NOT DETECTED NOT DETECTED Final   Candida albicans NOT DETECTED NOT DETECTED Final   Candida auris NOT DETECTED NOT DETECTED Final   Candida glabrata NOT DETECTED NOT DETECTED Final   Candida krusei NOT DETECTED NOT DETECTED Final   Candida parapsilosis NOT DETECTED NOT DETECTED Final   Candida tropicalis NOT DETECTED NOT DETECTED Final    Cryptococcus neoformans/gattii NOT DETECTED NOT DETECTED Final   CTX-M ESBL NOT DETECTED NOT DETECTED Final   Carbapenem resistance IMP NOT DETECTED NOT DETECTED Final   Carbapenem resistance KPC NOT DETECTED NOT DETECTED Final   Carbapenem resistance NDM NOT DETECTED NOT DETECTED Final   Carbapenem resist OXA 48 LIKE NOT DETECTED NOT DETECTED Final   Carbapenem resistance VIM NOT DETECTED NOT DETECTED Final    Comment: Performed at [facility], [address] [place],  [address]  Blood culture (routine x 2)     Status: None (Preliminary result)   Collection Time: 01/23/21  1:17 PM   Specimen: BLOOD  Result Value Ref Range Status   Specimen Description BLOOD BLOOD RIGHT FOREARM  Final   Special Requests   Final    BOTTLES DRAWN AEROBIC AND ANAEROBIC Blood Culture results may not be optimal due to an inadequate volume of blood received  in culture bottles   Culture   Final    NO GROWTH 2 DAYS Performed at [facility], [address] [place], [place] [address]    Report Status PENDING  Incomplete  Resp Panel by RT-PCR (Flu A&B, Covid) Nasopharyngeal Swab     Status: None   Collection Time: 01/23/21  4:49 PM   Specimen: Nasopharyngeal Swab; Nasopharyngeal(NP) swabs in vial transport medium  Result Value Ref Range Status   SARS Coronavirus 2 by RT PCR NEGATIVE NEGATIVE Final    Comment: (NOTE) SARS-CoV-2 target nucleic acids are NOT DETECTED.  The SARS-CoV-2 RNA is generally detectable in upper respiratory specimens during the acute phase of infection. The lowest concentration of SARS-CoV-2 viral copies this assay can detect is 138 copies/mL. A negative result does not preclude SARS-Cov-2 infection and should not be used as the sole basis for treatment or other patient management decisions. A negative result may occur with  improper specimen collection/handling, submission of specimen other than nasopharyngeal swab, presence of viral mutation(s) within  the areas targeted by this assay, and inadequate number of viral copies(<138 copies/mL). A negative result must be combined with clinical observations, patient history, and epidemiological information. The expected result is Negative.  Fact Sheet for Patients:  EntrepreneurPulse.com.au  Fact Sheet for Healthcare Providers:  IncredibleEmployment.be  This test is no t yet approved or cleared by the [place] FDA and  has been authorized for detection and/or diagnosis of SARS-CoV-2 by FDA under an Emergency Use Authorization (EUA). This EUA will remain  in effect (meaning this test can be used) for the duration of the COVID-19 declaration under Section 564(b)(1) of the Act, 21 U.S.C.section 360bbb-3(b)(1), unless the authorization is terminated  or revoked sooner.       Influenza A by PCR NEGATIVE NEGATIVE Final   Influenza B by PCR NEGATIVE NEGATIVE Final    Comment: (NOTE) The Xpert Xpress SARS-CoV-2/FLU/RSV plus assay is intended as an aid in the diagnosis of influenza from Nasopharyngeal swab specimens and should not be used as a sole basis for treatment. Nasal washings and aspirates are unacceptable for Xpert Xpress SARS-CoV-2/FLU/RSV testing.  Fact Sheet for Patients: EntrepreneurPulse.com.au  Fact Sheet for Healthcare Providers: IncredibleEmployment.be  This test is not yet approved or cleared by the [place] FDA and has been authorized for detection and/or diagnosis of SARS-CoV-2 by FDA under an Emergency Use Authorization (EUA). This EUA will remain in effect (meaning this test can be used) for the duration of the COVID-19 declaration under Section 564(b)(1) of the Act, 21 U.S.C. section 360bbb-3(b)(1), unless the authorization is terminated or revoked.  Performed at [facility], [address] [place], [place] [address]          Radiology Studies: CT ABDOMEN PELVIS  WO CONTRAST  Result Date: 01/23/2021 CLINICAL DATA:  Abdominal pain, acute, nonlocalized. Generalized weakness. Hypotension. EXAM: CT ABDOMEN AND PELVIS WITHOUT CONTRAST TECHNIQUE: Multidetector CT imaging of the abdomen and pelvis was performed following the standard protocol without IV contrast. COMPARISON:  08/09/2012 FINDINGS: Lower chest: Linear scarring or atelectasis at the right lung base. Otherwise no active lower chest finding. Hepatobiliary: Liver appears normal without contrast. 1 cm calcified stone in the gallbladder neck without definite CT evidence of cholecystitis. Consider right upper quadrant ultrasound if concern persists regarding possible clinical cholecystitis. Pancreas: Normal Spleen: Normal Adrenals/Urinary Tract: Adrenal glands are normal. Multiple bilateral renal cysts. Nonobstructing 6 mm stone in the lower pole of the left kidney. No passing stone or hydronephrosis.  No stone in the bladder. Stomach/Bowel: Stomach and small intestine are normal. No evidence of diverticulosis or diverticulitis. Vascular/Lymphatic: Aortic atherosclerosis. No aneurysm. IVC is normal. No retroperitoneal adenopathy. Reproductive: Enlarged prostate. Other: Tiny amount of free fluid in the pelvic cul-de-sac. No free air. Musculoskeletal: Ordinary lower lumbar degenerative changes. IMPRESSION: 1 cm gallstone in the gallbladder neck. No CT evidence of cholecystitis. Consider right upper quadrant ultrasound if there is clinical concern regarding potential cholecystitis. Aortic Atherosclerosis (ICD10-I70.0). Multiple renal cysts. 6 mm nonobstructing stone in the lower pole of the left kidney. Enlarged prostate. Electronically Signed   By: Nelson Chimes M.D.   On: 01/23/2021 14:58   NM Hepatobiliary Liver Func  Result Date: 01/24/2021 CLINICAL DATA:  Abdominal pain.  Cholelithiasis EXAM: NUCLEAR MEDICINE HEPATOBILIARY IMAGING TECHNIQUE: Sequential images of the abdomen were obtained out to 60 minutes following  intravenous administration of radiopharmaceutical. RADIOPHARMACEUTICALS:  5.6 mCi Tc-60m  Choletec IV COMPARISON:  CT 01/23/2021 FINDINGS: Prompt clearance of radiotracer from blood pool and homogeneous uptake in liver. Counts are evident in the small bowel by 50 minutes. At 90 minutes, IV morphine was administered. Gallbladder failed to contract after morphine augmentation. IMPRESSION: Non filling of the gall bladder is consistent with cystic duct obstruction (acute cholecystitis). These results will be called to the ordering clinician or representative by the Radiologist Assistant, and communication documented in the PACS or [employer]. Electronically Signed   By: Suzy Bouchard M.D.   On: 01/24/2021 15:59   IR Perc Cholecystostomy  Result Date: 01/25/2021 INDICATION: 80 year old with evidence of acute cholecystitis based on imaging. Request for a percutaneous cholecystostomy tube placement. EXAM: Percutaneous cholecystostomy tube placement with ultrasound and fluoroscopic guidance. MEDICATIONS: Moderate sedation.  Patient is receiving scheduled IV antibiotics. ANESTHESIA/SEDATION: Moderate (conscious) sedation was employed during this procedure. A total of Versed 1.0mg  and fentanyl 50 mcg was administered intravenously at the order of the provider performing the procedure. Total intra-service moderate sedation time: 14 minutes. Patient's level of consciousness and vital signs were monitored continuously by radiology nurse throughout the procedure under the supervision of the provider performing the procedure. FLUOROSCOPY TIME:  Fluoroscopy Time: 12 seconds, 2.4 mGy CONTRAST:  3 mL Omnipaque [id number] COMPLICATIONS: None immediate. PROCEDURE: Informed written consent was obtained from the patient after a thorough discussion of the procedural risks, benefits and alternatives. All questions were addressed. A timeout was performed prior to the initiation of the procedure. Ultrasound was used to identify the  gallbladder. Ultrasound image was saved for documentation. The right side of the abdomen was prepped and draped in sterile fashion. Maximal barrier sterile technique was utilized including caps, mask, sterile gowns, sterile gloves, sterile drape, hand hygiene and skin antiseptic. Skin was anesthetized using 1% lidocaine. Small incision was made. Using ultrasound guidance, an 18 gauge trocar needle was directed into the gallbladder using a transhepatic approach. A superstiff Amplatz wire was advanced to the gallbladder using ultrasound and fluoroscopic guidance. The tract was dilated to accommodate a 10 [place] multipurpose drain. Cloudy bilious fluid was aspirated from the gallbladder. The gallbladder was decompressed at the end of the procedure. Fluid sample sent for culture. Drain was secured to the skin with silk suture and attached to a gravity bag. Dressing was placed. FINDINGS: Gallbladder was mildly distended at the beginning of the procedure. Drain successfully placed in the gallbladder and the gallbladder was decompressed at the end of the procedure. Fluoroscopy demonstrates a stone near the base of the gallbladder. IMPRESSION: Successful percutaneous cholecystostomy tube placement with ultrasound  and fluoroscopic guidance. Electronically Signed   By: Markus Daft M.D.   On: 01/25/2021 13:06   DG Chest Port 1 View  Result Date: 01/23/2021 CLINICAL DATA:  Weakness Increase lactic acid EXAM: PORTABLE CHEST 1 VIEW COMPARISON:  None. FINDINGS: Heart size within normal limits. No pulmonary vascular congestion. Right hemidiaphragm is elevated. Lungs are clear. IMPRESSION: No acute cardiopulmonary process. Electronically Signed   By: Miachel Roux M.D.   On: 01/23/2021 16:32   US Abdomen Limited RUQ (LIVER/GB)  Result Date: 01/23/2021 CLINICAL DATA:  Right upper quadrant pain EXAM: ULTRASOUND ABDOMEN LIMITED RIGHT UPPER QUADRANT COMPARISON:  CT 01/23/2021 FINDINGS: Gallbladder: Sludge within the gallbladder.  16 mm non mobile stone at the gallbladder neck. Slight increased wall thickness of 3.7 mm but negative sonographic Murphy. Common bile duct: Diameter: 5.2 mm Liver: No focal lesion identified. Within normal limits in parenchymal echogenicity. Portal vein is patent on color Doppler imaging with normal direction of blood flow towards the liver. Other: Right kidney cortex appears slightly echogenic. IMPRESSION: 1. Sludge in the gallbladder with non mobile stone at the gallbladder neck with slight increased wall thickness but negative sonographic Murphy. Findings are indeterminate for cholecystitis, consider correlation with nuclear medicine hepatobiliary imaging 2. Right kidney cortex appears slightly echogenic suggesting medical renal disease, correlate with appropriate laboratory values Electronically Signed   By: Donavan Foil M.D.   On: 01/23/2021 15:45        Scheduled Meds:  carvedilol  3.125 mg Oral BID WC   diltiazem  30 mg Oral Q6H   fentaNYL       heparin  5,000 Units Subcutaneous Q8H   insulin aspart  0-5 Units Subcutaneous QHS   insulin aspart  0-9 Units Subcutaneous TID WC   isosorbide mononitrate  30 mg Oral QPM   lidocaine       midazolam       multivitamin with minerals  1 tablet Oral Daily   pantoprazole  40 mg Oral Daily   sodium bicarbonate  650 mg Oral TID   sodium chloride flush  5 mL Intracatheter Q8H   vitamin B-12  1,000 mcg Oral Daily   Continuous Infusions:  sodium chloride 50 mL/hr at 01/24/21 1146   piperacillin-tazobactam (ZOSYN)  IV 2.25 g (01/25/21 0635)     LOS: 1 day    Time spent: 35 minutes    Sidney Ace, MD [facility]   If 7PM-7AM, please contact night-coverage  01/25/2021, 1:50 PM

## 2021-01-26 DIAGNOSIS — K8010 Calculus of gallbladder with chronic cholecystitis without obstruction: Secondary | ICD-10-CM | POA: Diagnosis not present

## 2021-01-26 LAB — CBC WITH DIFFERENTIAL/PLATELET
Abs Immature Granulocytes: 0.04 10*3/uL (ref 0.00–0.07)
Basophils Absolute: 0 10*3/uL (ref 0.0–0.1)
Basophils Relative: 0 %
Eosinophils Absolute: 0 10*3/uL (ref 0.0–0.5)
Eosinophils Relative: 1 %
HCT: 31.8 % — ABNORMAL LOW (ref 39.0–52.0)
Hemoglobin: 9.8 g/dL — ABNORMAL LOW (ref 13.0–17.0)
Immature Granulocytes: 1 %
Lymphocytes Relative: 8 %
Lymphs Abs: 0.5 10*3/uL — ABNORMAL LOW (ref 0.7–4.0)
MCH: 27.8 pg (ref 26.0–34.0)
MCHC: 30.8 g/dL (ref 30.0–36.0)
MCV: 90.3 fL (ref 80.0–100.0)
Monocytes Absolute: 0.4 10*3/uL (ref 0.1–1.0)
Monocytes Relative: 6 %
Neutro Abs: 5.4 10*3/uL (ref 1.7–7.7)
Neutrophils Relative %: 84 %
Platelets: 182 10*3/uL (ref 150–400)
RBC: 3.52 MIL/uL — ABNORMAL LOW (ref 4.22–5.81)
RDW: 15.1 % (ref 11.5–15.5)
WBC: 6.4 10*3/uL (ref 4.0–10.5)
nRBC: 0 % (ref 0.0–0.2)

## 2021-01-26 LAB — GLUCOSE, CAPILLARY
Glucose-Capillary: 129 mg/dL — ABNORMAL HIGH (ref 70–99)
Glucose-Capillary: 177 mg/dL — ABNORMAL HIGH (ref 70–99)
Glucose-Capillary: 164 mg/dL — ABNORMAL HIGH (ref 70–99)
Glucose-Capillary: 154 mg/dL — ABNORMAL HIGH (ref 70–99)

## 2021-01-26 LAB — BASIC METABOLIC PANEL
Anion gap: 14 (ref 5–15)
BUN: 109 mg/dL — ABNORMAL HIGH (ref 8–23)
CO2: 17 mmol/L — ABNORMAL LOW (ref 22–32)
Calcium: 8.2 mg/dL — ABNORMAL LOW (ref 8.9–10.3)
Chloride: 108 mmol/L (ref 98–111)
Creatinine, Ser: 8.58 mg/dL — ABNORMAL HIGH (ref 0.61–1.24)
GFR, Estimated: 6 mL/min — ABNORMAL LOW (ref >60)
Glucose, Bld: 205 mg/dL — ABNORMAL HIGH (ref 70–99)
Potassium: 4.3 mmol/L (ref 3.5–5.1)
Sodium: 139 mmol/L (ref 135–145)

## 2021-01-26 MED ORDER — SODIUM CHLORIDE 0.9% FLUSH: 5.0000 mL | Freq: Two times a day (BID) | INTRAVENOUS | 0 refills | Status: DC

## 2021-01-26 MED ORDER — DILTIAZEM HCL ER COATED BEADS 120 MG PO CP24
120.0000 mg | ORAL_CAPSULE | Freq: Every day | ORAL | Status: DC
  Administered 2021-01-27 – 2021-01-29 (×3): 120 mg via ORAL
  Filled 2021-01-26 (×3): qty 1

## 2021-01-26 NOTE — Progress Notes (Signed)
PROGRESS NOTE    Gregory Dixon  WVP:[id number] DOB: 1940-07-06 DOA: 01/23/2021 PCP: Valera Castle, MD    Brief Narrative:  80 year old male past medical history for chronic kidney disease stage IV, B12 deficiency, hypertension and dyslipidemia who presented with weakness.  Reported 3-4 days of chills, difficulty ambulating, no chest pain, dyspnea or abdominal pain.   Abdominal ultrasound with sludge in the gallbladder, nonmobile stone at the gallbladder neck with slight increase wall thickness but negative sonographic Murphy sign.   Patient has been placed on intravenous antibiotics, surgery was consulted. Recommendations to get a HIDA scan.  HIDA scan positive.  Considering patient's medical comorbidities and high surgical risk and the recommendation was made for a percutaneous cholecystostomy.  This was placed 10/21.  Patient tolerated without issue.   Patient developed new onset atrial fibrillation with rapid ventricular response, placed on diltiazem drip.  Diltiazem drip will be weaned off and transition to p.o. Cardizem on 10/21  Patient also has deteriorating kidney function.  AKI on CKD stage IV.  Will likely need initiation of dialysis.  Tentative plan is to place a peritoneal dialysis catheter intraoperatively when patient stable for cholecystectomy.  However this may be 2 months from now and patient's kidney function may deteriorate before that time.  Nephrology following and will discuss with patient regarding initiation of temporary hemodialysis.   Assessment & Plan:   Principal Problem:   Cholelithiasis Active Problems:   Benign prostatic hyperplasia   Chronic kidney disease   Diabetic peripheral neuropathy associated with type 2 diabetes mellitus (HCC)   Hyperlipidemia   Anemia associated with chronic renal failure   Weakness   Elevated LFTs   Transaminitis   Cholecystitis  Cholecystitis Positive HIDA scan General surgery consulted, recommend percutaneous  cholecystostomy Percutaneous cholecystostomy placed 10/21 Plan: Maintain drain to gravity As needed pain control Antibiotic therapy with Zosyn Can transition to Augmentin if patient will discharge home General surgery signed off 10/22   AKI on CKD stage V Non-anion gap metabolic acidosis Deteriorating kidney function Patient will likely need some form of renal replacement therapy Initially plan was for PD catheter placement during cholecystectomy Kidney function may deteriorate before that time requiring initiation of temporary hemodialysis Discussed with nephrology Plan: Continue low rate NS Continue sodium bicarb oral Avoid hypotension and nephrotoxins  nephrology to follow-up and discussed with patient regarding initiation of temporary hemodialysis.  Discussed with Dr. Lanora Manis  New onset atrial fibrillation Noted on 10/20 Placed on diltiazem for good rate control Weaned to oral diltiazem on 10/22 Plan: DC diltiazem drip Continue p.o. diltiazem 30 mg every 6 hours Transition to long-acting Cardizem CD100 20 mg daily on 10/23 Continue low-dose Coreg Telemetry monitoring Defer anticoagulation for now pending possible dialysis catheter placement  Essential hypertension Coreg resumed Imdur resumed  GERD PPI  Chronic anemia/MGUS No indication for transfusion Continue to follow cell counts closely  DVT prophylaxis: SCD Code Status: Full Family Communication: Family member at bedside Disposition Plan: Status is: Inpatient  Remains inpatient appropriate because: Deteriorating kidney function.  New onset atrial fibrillation.  Acute cholecystitis requiring cholecystostomy tube placement.  Disposition plan pending.       Level of care: Progressive Cardiac  Consultants:  General surgery Interventional radiology Nephrology  Procedures:  Percutaneous cholecystostomy 10/21  Antimicrobials: Zosyn   Subjective: Patient seen and examined.  Drain in place.   No complaints this morning.  Family member at bedside.  Objective: Vitals:   01/26/21 0053 01/26/21 0438 01/26/21 0807 01/26/21 1126  BP:  105/69 112/74 103/63 107/65  Pulse: 85 72 73 62  Resp: 20 20 16 16   Temp: (!) 97.5 F (36.4 C) (!) 97.3 F (36.3 C) 98 F (36.7 C) 97.9 F (36.6 C)  TempSrc: Oral Oral    SpO2: 96% 98% 98% 96%  Weight:      Height:        Intake/Output Summary (Last 24 hours) at 01/26/2021 1311 Last data filed at 01/26/2021 1000 Gross per 24 hour  Intake 245 ml  Output 740 ml  Net -495 ml   Filed Weights   01/23/21 1304 01/24/21 1618 01/25/21 1015  Weight: 97.5 kg 108.1 kg 108.1 kg    Examination:  General exam: Appears calm and comfortable  Respiratory system: Clear to auscultation. Respiratory effort normal. Cardiovascular system: S1-S2, regular rate, regular rhythm, no murmurs, trace pedal edema gastrointestinal system: Soft, NT/ND, right-sided percutaneous cholecystostomy Central nervous system: Alert and oriented. No focal neurological deficits. Extremities: Symmetric 5 x 5 power. Skin: No rashes, lesions or ulcers Psychiatry: Judgement and insight appear normal. Mood & affect appropriate.     Data Reviewed: I have personally reviewed following labs and imaging studies  CBC: Recent Labs  Lab 01/23/21 1058 01/24/21 0609 01/25/21 0535 01/26/21 0945  WBC 6.7 5.3 4.1 6.4  NEUTROABS 6.0  --  3.2 5.4  HGB 9.6* 8.8* 9.1* 9.8*  HCT 30.4* 26.6* 27.9* 31.8*  MCV 90.2 88.7 86.9 90.3  PLT 125* 128* 148* 546   Basic Metabolic Panel: Recent Labs  Lab 01/23/21 1058 01/24/21 0609 01/25/21 0535 01/26/21 0945  NA 134* 137 139 139  K 4.0 3.9 4.1 4.3  CL 102 109 110 108  CO2 19* 18* 18* 17*  GLUCOSE 121* 117* 127* 205*  BUN 70* 83* 99* 109*  CREATININE 7.73* 7.81* 8.37* 8.58*  CALCIUM 8.0* 8.3* 8.2* 8.2*  MG  --  2.0  --   --   PHOS  --   --  6.7*  --    GFR: Estimated Creatinine Clearance: 9 mL/min (A) (by C-G formula based on SCr of  8.58 mg/dL (H)). Liver Function Tests: Recent Labs  Lab 01/23/21 1058 01/24/21 0609 01/25/21 0535  AST 682* 303* 146*  ALT 189* 162* 131*  ALKPHOS 32* 31* 28*  BILITOT 0.8 0.6 0.4  PROT 6.2* 5.6* 5.3*  ALBUMIN 2.8* 2.5* 2.2*  2.4*   Recent Labs  Lab 01/23/21 1330  LIPASE 34   No results for input(s): AMMONIA in the last 168 hours. Coagulation Profile: Recent Labs  Lab 01/25/21 0535  INR 1.3*   Cardiac Enzymes: No results for input(s): CKTOTAL, CKMB, CKMBINDEX, TROPONINI in the last 168 hours. BNP (last 3 results) No results for input(s): PROBNP in the last 8760 hours. HbA1C: Recent Labs    01/25/21 0535  HGBA1C 5.6   CBG: Recent Labs  Lab 01/25/21 1311 01/25/21 1659 01/25/21 2051 01/26/21 0805 01/26/21 1123  GLUCAP 145* 164* 115* 129* 177*   Lipid Profile: No results for input(s): CHOL, HDL, LDLCALC, TRIG, CHOLHDL, LDLDIRECT in the last 72 hours. Thyroid Function Tests: Recent Labs    01/24/21 0609  TSH 2.713   Anemia Panel: Recent Labs    01/24/21 0609  VITAMINB12 1,244*   Sepsis Labs: Recent Labs  Lab 01/23/21 1312 01/23/21 1751  LATICACIDVEN 2.7* 1.1    Recent Results (from the past 240 hour(s))  Blood culture (routine x 2)     Status: Abnormal   Collection Time: 01/23/21  1:12 PM   Specimen:  BLOOD  Result Value Ref Range Status   Specimen Description   Final    BLOOD RIGHT ANTECUBITAL Performed at [facility], [address] [place], [place] [address]    Special Requests   Final    BOTTLES DRAWN AEROBIC AND ANAEROBIC Blood Culture results may not be optimal due to an inadequate volume of blood received in culture bottles Performed at [facility], [address] [place], [place] [address]    Culture  Setup Time   Final    GRAM NEGATIVE RODS ANAEROBIC BOTTLE ONLY CRITICAL RESULT CALLED TO, READ BACK BY AND VERIFIED WITH: NATHAN BLUE@0422  01/24/21 RH Performed at [facility], [place] [address]  [place], [place] [address]    Culture KLEBSIELLA OXYTOCA (A)  Final   Report Status 01/26/2021 FINAL  Final   Organism ID, Bacteria KLEBSIELLA OXYTOCA  Final      Susceptibility   Klebsiella oxytoca - MIC*    AMPICILLIN >=32 RESISTANT Resistant     CEFAZOLIN 8 SENSITIVE Sensitive     CEFEPIME <=0.12 SENSITIVE Sensitive     CEFTAZIDIME <=1 SENSITIVE Sensitive     CEFTRIAXONE <=0.25 SENSITIVE Sensitive     CIPROFLOXACIN <=0.25 SENSITIVE Sensitive     GENTAMICIN <=1 SENSITIVE Sensitive     IMIPENEM <=0.25 SENSITIVE Sensitive     TRIMETH/SULFA <=20 SENSITIVE Sensitive     AMPICILLIN/SULBACTAM 8 SENSITIVE Sensitive     PIP/TAZO <=4 SENSITIVE Sensitive     * KLEBSIELLA OXYTOCA  Blood Culture ID Panel (Reflexed)     Status: Abnormal   Collection Time: 01/23/21  1:12 PM  Result Value Ref Range Status   Enterococcus faecalis NOT DETECTED NOT DETECTED Final   Enterococcus Faecium NOT DETECTED NOT DETECTED Final   Listeria monocytogenes NOT DETECTED NOT DETECTED Final   Staphylococcus species NOT DETECTED NOT DETECTED Final   Staphylococcus aureus (BCID) NOT DETECTED NOT DETECTED Final   Staphylococcus epidermidis NOT DETECTED NOT DETECTED Final   Staphylococcus lugdunensis NOT DETECTED NOT DETECTED Final   Streptococcus species NOT DETECTED NOT DETECTED Final   Streptococcus agalactiae NOT DETECTED NOT DETECTED Final   Streptococcus pneumoniae NOT DETECTED NOT DETECTED Final   Streptococcus pyogenes NOT DETECTED NOT DETECTED Final   A.calcoaceticus-baumannii NOT DETECTED NOT DETECTED Final   Bacteroides fragilis NOT DETECTED NOT DETECTED Final   Enterobacterales DETECTED (A) NOT DETECTED Final    Comment: Enterobacterales represent a large order of gram negative bacteria, not a single organism. CRITICAL RESULT CALLED TO, READ BACK BY AND VERIFIED WITH: NATHAN BLUE@0422  01/24/21 RH    Enterobacter cloacae complex NOT DETECTED NOT DETECTED Final   Escherichia coli NOT DETECTED NOT DETECTED  Final   Klebsiella aerogenes NOT DETECTED NOT DETECTED Final   Klebsiella oxytoca DETECTED (A) NOT DETECTED Final    Comment: CRITICAL RESULT CALLED TO, READ BACK BY AND VERIFIED WITH: NATHAN BLUE@0422  01/24/21 RH    Klebsiella pneumoniae NOT DETECTED NOT DETECTED Final   Proteus species NOT DETECTED NOT DETECTED Final   Salmonella species NOT DETECTED NOT DETECTED Final   Serratia marcescens NOT DETECTED NOT DETECTED Final   Haemophilus influenzae NOT DETECTED NOT DETECTED Final   Neisseria meningitidis NOT DETECTED NOT DETECTED Final   Pseudomonas aeruginosa NOT DETECTED NOT DETECTED Final   Stenotrophomonas maltophilia NOT DETECTED NOT DETECTED Final   Candida albicans NOT DETECTED NOT DETECTED Final   Candida auris NOT DETECTED NOT DETECTED Final   Candida glabrata NOT DETECTED NOT DETECTED Final   Candida krusei  NOT DETECTED NOT DETECTED Final   Candida parapsilosis NOT DETECTED NOT DETECTED Final   Candida tropicalis NOT DETECTED NOT DETECTED Final   Cryptococcus neoformans/gattii NOT DETECTED NOT DETECTED Final   CTX-M ESBL NOT DETECTED NOT DETECTED Final   Carbapenem resistance IMP NOT DETECTED NOT DETECTED Final   Carbapenem resistance KPC NOT DETECTED NOT DETECTED Final   Carbapenem resistance NDM NOT DETECTED NOT DETECTED Final   Carbapenem resist OXA 48 LIKE NOT DETECTED NOT DETECTED Final   Carbapenem resistance VIM NOT DETECTED NOT DETECTED Final    Comment: Performed at [facility], [address] [place], [place] [address]  Blood culture (routine x 2)     Status: None (Preliminary result)   Collection Time: 01/23/21  1:17 PM   Specimen: BLOOD  Result Value Ref Range Status   Specimen Description BLOOD BLOOD RIGHT FOREARM  Final   Special Requests   Final    BOTTLES DRAWN AEROBIC AND ANAEROBIC Blood Culture results may not be optimal due to an inadequate volume of blood received in culture bottles   Culture   Final    NO GROWTH 2 DAYS Performed at  [facility], [address], [place], [place] [address]    Report Status PENDING  Incomplete  Resp Panel by RT-PCR (Flu A&B, Covid) Nasopharyngeal Swab     Status: None   Collection Time: 01/23/21  4:49 PM   Specimen: Nasopharyngeal Swab; Nasopharyngeal(NP) swabs in vial transport medium  Result Value Ref Range Status   SARS Coronavirus 2 by RT PCR NEGATIVE NEGATIVE Final    Comment: (NOTE) SARS-CoV-2 target nucleic acids are NOT DETECTED.  The SARS-CoV-2 RNA is generally detectable in upper respiratory specimens during the acute phase of infection. The lowest concentration of SARS-CoV-2 viral copies this assay can detect is 138 copies/mL. A negative result does not preclude SARS-Cov-2 infection and should not be used as the sole basis for treatment or other patient management decisions. A negative result may occur with  improper specimen collection/handling, submission of specimen other than nasopharyngeal swab, presence of viral mutation(s) within the areas targeted by this assay, and inadequate number of viral copies(<138 copies/mL). A negative result must be combined with clinical observations, patient history, and epidemiological information. The expected result is Negative.  Fact Sheet for Patients:  EntrepreneurPulse.com.au  Fact Sheet for Healthcare Providers:  IncredibleEmployment.be  This test is no t yet approved or cleared by the [place] FDA and  has been authorized for detection and/or diagnosis of SARS-CoV-2 by FDA under an Emergency Use Authorization (EUA). This EUA will remain  in effect (meaning this test can be used) for the duration of the COVID-19 declaration under Section 564(b)(1) of the Act, 21 U.S.C.section 360bbb-3(b)(1), unless the authorization is terminated  or revoked sooner.       Influenza A by PCR NEGATIVE NEGATIVE Final   Influenza B by PCR NEGATIVE NEGATIVE Final    Comment: (NOTE) The  Xpert Xpress SARS-CoV-2/FLU/RSV plus assay is intended as an aid in the diagnosis of influenza from Nasopharyngeal swab specimens and should not be used as a sole basis for treatment. Nasal washings and aspirates are unacceptable for Xpert Xpress SARS-CoV-2/FLU/RSV testing.  Fact Sheet for Patients: EntrepreneurPulse.com.au  Fact Sheet for Healthcare Providers: IncredibleEmployment.be  This test is not yet approved or cleared by the [place] FDA and has been authorized for detection and/or diagnosis of SARS-CoV-2 by FDA under an Emergency Use Authorization (EUA). This EUA will remain in effect (meaning  this test can be used) for the duration of the COVID-19 declaration under Section 564(b)(1) of the Act, 21 U.S.C. section 360bbb-3(b)(1), unless the authorization is terminated or revoked.  Performed at [facility], [address] [place], [place] [address]   Aerobic/Anaerobic Culture w Gram Stain (surgical/deep wound)     Status: None (Preliminary result)   Collection Time: 01/25/21 11:39 AM   Specimen: BILE  Result Value Ref Range Status   Specimen Description   Final    BILE Performed at [facility], [address], [place], [place] [address]    Special Requests   Final    NONE Performed at [facility], [place], [place] [address]    Gram Stain   Final    NO SQUAMOUS EPITHELIAL CELLS SEEN FEW NO WBC SEEN FEW GRAM POSITIVE RODS MODERATE GRAM POSITIVE COCCI Performed at [facility], [place] [address] [place],  [address]    Culture PENDING  Incomplete   Report Status PENDING  Incomplete         Radiology Studies: NM Hepatobiliary Liver Func  Result Date: 01/24/2021 CLINICAL DATA:  Abdominal pain.  Cholelithiasis EXAM: NUCLEAR MEDICINE HEPATOBILIARY IMAGING TECHNIQUE: Sequential images of the abdomen were obtained out to 60 minutes following intravenous  administration of radiopharmaceutical. RADIOPHARMACEUTICALS:  5.6 mCi Tc-32m  Choletec IV COMPARISON:  CT 01/23/2021 FINDINGS: Prompt clearance of radiotracer from blood pool and homogeneous uptake in liver. Counts are evident in the small bowel by 50 minutes. At 90 minutes, IV morphine was administered. Gallbladder failed to contract after morphine augmentation. IMPRESSION: Non filling of the gall bladder is consistent with cystic duct obstruction (acute cholecystitis). These results will be called to the ordering clinician or representative by the Radiologist Assistant, and communication documented in the PACS or [employer]. Electronically Signed   By: Suzy Bouchard M.D.   On: 01/24/2021 15:59   IR Perc Cholecystostomy  Result Date: 01/25/2021 INDICATION: 80 year old with evidence of acute cholecystitis based on imaging. Request for a percutaneous cholecystostomy tube placement. EXAM: Percutaneous cholecystostomy tube placement with ultrasound and fluoroscopic guidance. MEDICATIONS: Moderate sedation.  Patient is receiving scheduled IV antibiotics. ANESTHESIA/SEDATION: Moderate (conscious) sedation was employed during this procedure. A total of Versed 1.0mg  and fentanyl 50 mcg was administered intravenously at the order of the provider performing the procedure. Total intra-service moderate sedation time: 14 minutes. Patient's level of consciousness and vital signs were monitored continuously by radiology nurse throughout the procedure under the supervision of the provider performing the procedure. FLUOROSCOPY TIME:  Fluoroscopy Time: 12 seconds, 2.4 mGy CONTRAST:  3 mL Omnipaque [id number] COMPLICATIONS: None immediate. PROCEDURE: Informed written consent was obtained from the patient after a thorough discussion of the procedural risks, benefits and alternatives. All questions were addressed. A timeout was performed prior to the initiation of the procedure. Ultrasound was used to identify the gallbladder.  Ultrasound image was saved for documentation. The right side of the abdomen was prepped and draped in sterile fashion. Maximal barrier sterile technique was utilized including caps, mask, sterile gowns, sterile gloves, sterile drape, hand hygiene and skin antiseptic. Skin was anesthetized using 1% lidocaine. Small incision was made. Using ultrasound guidance, an 18 gauge trocar needle was directed into the gallbladder using a transhepatic approach. A superstiff Amplatz wire was advanced to the gallbladder using ultrasound and fluoroscopic guidance. The tract was dilated to accommodate a 10 [place] multipurpose drain. Cloudy bilious fluid was aspirated from the gallbladder. The gallbladder was decompressed  at the end of the procedure. Fluid sample sent for culture. Drain was secured to the skin with silk suture and attached to a gravity bag. Dressing was placed. FINDINGS: Gallbladder was mildly distended at the beginning of the procedure. Drain successfully placed in the gallbladder and the gallbladder was decompressed at the end of the procedure. Fluoroscopy demonstrates a stone near the base of the gallbladder. IMPRESSION: Successful percutaneous cholecystostomy tube placement with ultrasound and fluoroscopic guidance. Electronically Signed   By: Markus Daft M.D.   On: 01/25/2021 13:06        Scheduled Meds:  carvedilol  3.125 mg Oral BID WC   diltiazem  30 mg Oral Q6H   heparin  5,000 Units Subcutaneous Q8H   insulin aspart  0-5 Units Subcutaneous QHS   insulin aspart  0-9 Units Subcutaneous TID WC   isosorbide mononitrate  30 mg Oral QPM   multivitamin with minerals  1 tablet Oral Daily   pantoprazole  40 mg Oral Daily   sodium bicarbonate  650 mg Oral TID   sodium chloride flush  5 mL Intracatheter Q8H   vitamin B-12  1,000 mcg Oral Daily   Continuous Infusions:  sodium chloride 50 mL/hr at 01/26/21 1220   piperacillin-tazobactam (ZOSYN)  IV 2.25 g (01/26/21 3125)     LOS: 2 days    Time  spent: 25 minutes    Sidney Ace, MD [facility]   If 7PM-7AM, please contact night-coverage  01/26/2021, 1:11 PM

## 2021-01-26 NOTE — Progress Notes (Signed)
[facility]  PROGRESS NOTE   Subjective:   Patient seen at bedside.  Family is in attendance Out of bed to chair today.  Able to ambulate with a walker. Had cholecystotomy drain placement yesterday. Denies any chest pain, shortness of breath or orthopnea.  Objective:  Vital signs in last 24 hours:  Temp:  [97.3 F (36.3 C)-98.5 F (36.9 C)] 97.9 F (36.6 C) (10/22 1126) Pulse Rate:  [62-108] 62 (10/22 1126) Resp:  [16-20] 16 (10/22 1126) BP: (91-112)/(53-74) 107/65 (10/22 1126) SpO2:  [93 %-98 %] 96 % (10/22 1126)  Weight change: 0 kg Filed Weights   01/23/21 1304 01/24/21 1618 01/25/21 1015  Weight: 97.5 kg 108.1 kg 108.1 kg    Intake/Output: I/O last 3 completed shifts: In: 861.7 [P.O.:720; I.V.:86.7; Other:5; IV Piggyback:50] Out: 0938 [Urine:600; Drains:440]   Intake/Output this shift:  No intake/output data recorded.  Physical Exam: General:  No acute distress  Head:  Normocephalic, atraumatic. Moist oral mucosal membranes  Eyes:  Anicteric  Neck:  Supple  Lungs:   Clear to auscultation, normal effort  Heart:  S1S2 no rubs  Abdomen:   Soft, nontender, bowel sounds present  Extremities:  peripheral edema.  Neurologic:  Awake, alert, following commands  Skin:  No lesions  Access:     Basic Metabolic Panel: Recent Labs  Lab 01/23/21 1058 01/24/21 0609 01/25/21 0535 01/26/21 0945  NA 134* 137 139 139  K 4.0 3.9 4.1 4.3  CL 102 109 110 108  CO2 19* 18* 18* 17*  GLUCOSE 121* 117* 127* 205*  BUN 70* 83* 99* 109*  CREATININE 7.73* 7.81* 8.37* 8.58*  CALCIUM 8.0* 8.3* 8.2* 8.2*  MG  --  2.0  --   --   PHOS  --   --  6.7*  --     CBC: Recent Labs  Lab 01/23/21 1058 01/24/21 0609 01/25/21 0535 01/26/21 0945  WBC 6.7 5.3 4.1 6.4  NEUTROABS 6.0  --  3.2 5.4  HGB 9.6* 8.8* 9.1* 9.8*  HCT 30.4* 26.6* 27.9* 31.8*  MCV 90.2 88.7 86.9 90.3  PLT 125* 128* 148* 182     Urinalysis: No results for input(s): COLORURINE, LABSPEC,  PHURINE, GLUCOSEU, HGBUR, BILIRUBINUR, KETONESUR, PROTEINUR, UROBILINOGEN, NITRITE, LEUKOCYTESUR in the last 72 hours.  Invalid input(s): APPERANCEUR    Imaging: NM Hepatobiliary Liver Func  Result Date: 01/24/2021 CLINICAL DATA:  Abdominal pain.  Cholelithiasis EXAM: NUCLEAR MEDICINE HEPATOBILIARY IMAGING TECHNIQUE: Sequential images of the abdomen were obtained out to 60 minutes following intravenous administration of radiopharmaceutical. RADIOPHARMACEUTICALS:  5.6 mCi Tc-55m  Choletec IV COMPARISON:  CT 01/23/2021 FINDINGS: Prompt clearance of radiotracer from blood pool and homogeneous uptake in liver. Counts are evident in the small bowel by 50 minutes. At 90 minutes, IV morphine was administered. Gallbladder failed to contract after morphine augmentation. IMPRESSION: Non filling of the gall bladder is consistent with cystic duct obstruction (acute cholecystitis). These results will be called to the ordering clinician or representative by the Radiologist Assistant, and communication documented in the PACS or [employer]. Electronically Signed   By: Suzy Bouchard M.D.   On: 01/24/2021 15:59   IR Perc Cholecystostomy  Result Date: 01/25/2021 INDICATION: 80 year old with evidence of acute cholecystitis based on imaging. Request for a percutaneous cholecystostomy tube placement. EXAM: Percutaneous cholecystostomy tube placement with ultrasound and fluoroscopic guidance. MEDICATIONS: Moderate sedation.  Patient is receiving scheduled IV antibiotics. ANESTHESIA/SEDATION: Moderate (conscious) sedation was employed during this procedure. A total of Versed 1.0mg  and fentanyl  50 mcg was administered intravenously at the order of the provider performing the procedure. Total intra-service moderate sedation time: 14 minutes. Patient's level of consciousness and vital signs were monitored continuously by radiology nurse throughout the procedure under the supervision of the provider performing the  procedure. FLUOROSCOPY TIME:  Fluoroscopy Time: 12 seconds, 2.4 mGy CONTRAST:  3 mL Omnipaque [id number] COMPLICATIONS: None immediate. PROCEDURE: Informed written consent was obtained from the patient after a thorough discussion of the procedural risks, benefits and alternatives. All questions were addressed. A timeout was performed prior to the initiation of the procedure. Ultrasound was used to identify the gallbladder. Ultrasound image was saved for documentation. The right side of the abdomen was prepped and draped in sterile fashion. Maximal barrier sterile technique was utilized including caps, mask, sterile gowns, sterile gloves, sterile drape, hand hygiene and skin antiseptic. Skin was anesthetized using 1% lidocaine. Small incision was made. Using ultrasound guidance, an 18 gauge trocar needle was directed into the gallbladder using a transhepatic approach. A superstiff Amplatz wire was advanced to the gallbladder using ultrasound and fluoroscopic guidance. The tract was dilated to accommodate a 10 [place] multipurpose drain. Cloudy bilious fluid was aspirated from the gallbladder. The gallbladder was decompressed at the end of the procedure. Fluid sample sent for culture. Drain was secured to the skin with silk suture and attached to a gravity bag. Dressing was placed. FINDINGS: Gallbladder was mildly distended at the beginning of the procedure. Drain successfully placed in the gallbladder and the gallbladder was decompressed at the end of the procedure. Fluoroscopy demonstrates a stone near the base of the gallbladder. IMPRESSION: Successful percutaneous cholecystostomy tube placement with ultrasound and fluoroscopic guidance. Electronically Signed   By: Markus Daft M.D.   On: 01/25/2021 13:06     Medications:    sodium chloride 50 mL/hr at 01/26/21 1220   piperacillin-tazobactam (ZOSYN)  IV 2.25 g (01/26/21 2119)    carvedilol  3.125 mg Oral BID WC   [START ON 01/27/2021] diltiazem  120 mg Oral Daily    diltiazem  30 mg Oral Q6H   heparin  5,000 Units Subcutaneous Q8H   insulin aspart  0-5 Units Subcutaneous QHS   insulin aspart  0-9 Units Subcutaneous TID WC   isosorbide mononitrate  30 mg Oral QPM   multivitamin with minerals  1 tablet Oral Daily   pantoprazole  40 mg Oral Daily   sodium bicarbonate  650 mg Oral TID   sodium chloride flush  5 mL Intracatheter Q8H   vitamin B-12  1,000 mcg Oral Daily    Assessment/ Plan:     Principal Problem:   Cholelithiasis Active Problems:   Benign prostatic hyperplasia   Chronic kidney disease   Diabetic peripheral neuropathy associated with type 2 diabetes mellitus (HCC)   Hyperlipidemia   Anemia associated with chronic renal failure   Weakness   Elevated LFTs   Transaminitis   Cholecystitis  80 year old white male with a history of diabetes, peripheral vascular disease, chronic kidney disease stage IV, hypertension, hyperlipidemia, coronary artery disease and anemia now admitted with history of worsening weakness, loss of appetite and some abdominal pain.  He is found to have acute cholecystitis.  He is now s/p cholecystotomy.  #1: Acute kidney injury on CKD: Patient has acute kidney injury which has been worsening since admission.  Presently has a GFR of less than 10 cc/min.  Spoke to the patient and his wife at bedside in detail and explained to them on the need  for initiation of dialysis.  They are both agreeable for the same.  We will have the permacath placed on Monday and initiate dialysis the same day.  We will make arrangements for outpatient dialysis rather quickly.  #2: Acute cholecystitis: Patient has been on IV antibiotics.  He also had a cholecystotomy placed.  The drains are patent.  #3: Diabetes: Continue insulin as ordered.  #4: Anemia/MGUS: Anemia is possibly secondary to chronic kidney disease.  Will initiate erythropoietin supplementation.  #5: Metabolic acidosis: Acidosis secondary to sepsis/chronic kidney disease.   We will continue the sodium bicarbonate orally.  #6: Atrial fibrillation: Continue to monitor closely on diltiazem.  Case discussed with PMD and vascular.   LOS: [place], MD [facility] 10/22/20221:57 PM

## 2021-01-26 NOTE — Progress Notes (Signed)
Subjective:  CC: Gregory Dixon is a 80 y.o. male  Hospital stay day 2,   acute cholecsytitis  HPI: No issues since drain placement.  Tolerating regular diet, pain controlled with oral meds  ROS:  General: Denies weight loss, weight gain, fatigue, fevers, chills, and night sweats. Heart: Denies chest pain, palpitations, racing heart, irregular heartbeat, leg pain or swelling, and decreased activity tolerance. Respiratory: Denies breathing difficulty, shortness of breath, wheezing, cough, and sputum. GI: Denies change in appetite, heartburn, nausea, vomiting, constipation, diarrhea, and blood in stool. GU: Denies difficulty urinating, pain with urinating, urgency, frequency, blood in urine.   Objective:   Temp:  [97.3 F (36.3 C)-98.5 F (36.9 C)] 98 F (36.7 C) (10/22 0807) Pulse Rate:  [61-108] 73 (10/22 0807) Resp:  [11-25] 16 (10/22 0807) BP: (88-123)/(50-77) 103/63 (10/22 0807) SpO2:  [89 %-100 %] 98 % (10/22 0807)     Height: 6\' 6"  (198.1 cm) Weight: 108.1 kg BMI (Calculated): 27.55   Intake/Output this shift:   Intake/Output Summary (Last 24 hours) at 01/26/2021 1111 Last data filed at 01/26/2021 1000 Gross per 24 hour  Intake 245 ml  Output 740 ml  Net -495 ml    Constitutional :  alert, cooperative, appears stated age, and no distress  Respiratory:  clear to auscultation bilaterally  Cardiovascular:  regular rate and rhythm, S1, S2 normal, no murmur, click, rub or gallop  Gastrointestinal: soft, non-tender; bowel sounds normal; no masses,  no organomegaly.   Skin: Cool and moist. Drain site clean. Bilious output  Psychiatric: Normal affect, non-agitated, not confused       LABS:  CMP Latest Ref Rng & Units 01/25/2021 01/24/2021 01/23/2021  Glucose 70 - 99 mg/dL 127(H) 117(H) 121(H)  BUN 8 - 23 mg/dL 99(H) 83(H) 70(H)  Creatinine 0.61 - 1.24 mg/dL 8.37(H) 7.81(H) 7.73(H)  Sodium 135 - 145 mmol/L 139 137 134(L)  Potassium 3.5 - 5.1 mmol/L 4.1 3.9 4.0  Chloride  98 - 111 mmol/L 110 109 102  CO2 22 - 32 mmol/L 18(L) 18(L) 19(L)  Calcium 8.9 - 10.3 mg/dL 8.2(L) 8.3(L) 8.0(L)  Total Protein 6.5 - 8.1 g/dL 5.3(L) 5.6(L) 6.2(L)  Total Bilirubin 0.3 - 1.2 mg/dL 0.4 0.6 0.8  Alkaline Phos 38 - 126 U/L 28(L) 31(L) 32(L)  AST 15 - 41 U/L 146(H) 303(H) 682(H)  ALT 0 - 44 U/L 131(H) 162(H) 189(H)   CBC Latest Ref Rng & Units 01/26/2021 01/25/2021 01/24/2021  WBC 4.0 - 10.5 K/uL 6.4 4.1 5.3  Hemoglobin 13.0 - 17.0 g/dL 9.8(L) 9.1(L) 8.8(L)  Hematocrit 39.0 - 52.0 % 31.8(L) 27.9(L) 26.6(L)  Platelets 150 - 400 K/uL 182 148(L) 128(L)    RADS: N/a Assessment:   S/p perc chole tube placement for acute cholecystitis.  Ok to d/c from surgery standpoint.  Discussed drain care as an output And f/u with clinic and radiology for continued drain care, discussion of interval cholecystectomy.  Surgery to sign off.  Please call with additional questions.

## 2021-01-26 NOTE — Discharge Instructions (Signed)
Cholecystostomy tube care:  flush catheter with 55ml of injectable saline two times a day

## 2021-01-27 ENCOUNTER — Encounter: Payer: Self-pay | Admitting: Internal Medicine

## 2021-01-27 DIAGNOSIS — K8010 Calculus of gallbladder with chronic cholecystitis without obstruction: Secondary | ICD-10-CM | POA: Diagnosis not present

## 2021-01-27 LAB — CULTURE, BLOOD (ROUTINE X 2)

## 2021-01-27 LAB — GLUCOSE, CAPILLARY
Glucose-Capillary: 131 mg/dL — ABNORMAL HIGH (ref 70–99)
Glucose-Capillary: 145 mg/dL — ABNORMAL HIGH (ref 70–99)
Glucose-Capillary: 134 mg/dL — ABNORMAL HIGH (ref 70–99)
Glucose-Capillary: 160 mg/dL — ABNORMAL HIGH (ref 70–99)

## 2021-01-27 NOTE — Progress Notes (Signed)
PROGRESS NOTE    Gregory Dixon  ELF:[id number] DOB: February 28, 1941 DOA: 01/23/2021 PCP: Valera Castle, MD    Brief Narrative:  80 year old male past medical history for chronic kidney disease stage IV, B12 deficiency, hypertension and dyslipidemia who presented with weakness.  Reported 3-4 days of chills, difficulty ambulating, no chest pain, dyspnea or abdominal pain.   Abdominal ultrasound with sludge in the gallbladder, nonmobile stone at the gallbladder neck with slight increase wall thickness but negative sonographic Murphy sign.   Patient has been placed on intravenous antibiotics, surgery was consulted. Recommendations to get a HIDA scan.  HIDA scan positive.  Considering patient's medical comorbidities and high surgical risk and the recommendation was made for a percutaneous cholecystostomy.  This was placed 10/21.  Patient tolerated without issue.   Patient developed new onset atrial fibrillation with rapid ventricular response, placed on diltiazem drip.  Diltiazem drip will be weaned off and transition to p.o. Cardizem on 10/21  Patient also has deteriorating kidney function.  AKI on CKD stage IV.  Will likely need initiation of dialysis.  Initial plan was to place a peritoneal dialysis catheter intraoperatively when patient stable for cholecystectomy.  However this may be 2 months from now and patient's kidney function may deteriorate before that time.  Nephrology following and will discuss with patient regarding initiation of temporary hemodialysis.  Nephrology discussed with patient.  He is in agreement to start temporary hemodialysis.  Plan for permacath placement Monday 10/24   Assessment & Plan:   Principal Problem:   Cholelithiasis Active Problems:   Benign prostatic hyperplasia   Chronic kidney disease   Diabetic peripheral neuropathy associated with type 2 diabetes mellitus (HCC)   Hyperlipidemia   Anemia associated with chronic renal failure   Weakness    Elevated LFTs   Transaminitis   Cholecystitis  Cholecystitis Positive HIDA scan General surgery consulted, recommend percutaneous cholecystostomy Percutaneous cholecystostomy placed 10/21 Plan: Maintain drain to gravity As needed pain control Antibiotic therapy with Zosyn Can transition to Augmentin if patient will discharge home General surgery signed off 10/22   AKI on CKD stage V Non-anion gap metabolic acidosis Deteriorating kidney function Patient will likely need some form of renal replacement therapy Initially plan was for PD catheter placement during cholecystectomy Kidney function may deteriorate before that time requiring initiation of temporary hemodialysis Discussed with nephrology Plan: Continue low rate NS Continue sodium bicarb oral Avoid hypotension and nephrotoxins  Initiate temporary hemodialysis in house PermCath to be placed 10/24  New onset atrial fibrillation Noted on 10/20 Placed on diltiazem for good rate control Weaned to oral diltiazem on 10/22 Plan: Continue Cardizem CD 120 mg daily Continue low-dose Coreg Telemetry monitoring Defer anticoagulation for now pending dialysis catheter placement  Essential hypertension Coreg resumed Imdur resumed  GERD PPI  Chronic anemia/MGUS No indication for transfusion Continue to follow cell counts closely  DVT prophylaxis: SCD Code Status: Full Family Communication: Family member at bedside 10/22 Disposition Plan: Status is: Inpatient  Remains inpatient appropriate because: Deteriorating kidney function.  New onset atrial fibrillation.  Acute cholecystitis requiring cholecystostomy tube placement.  Plan for PermCath placement 10/24.  Can likely start disposition planning after that.      Level of care: Progressive Cardiac  Consultants:  General surgery Interventional radiology Nephrology  Procedures:  Percutaneous cholecystostomy  10/21  Antimicrobials: Zosyn   Subjective: Patient seen and examined.  Sitting up in chair.  Good spirits.  Drain in place.  No complaints  Objective: Vitals:   01/26/21  1941 01/27/21 0416 01/27/21 0758 01/27/21 1146  BP: 114/63 110/66 (!) 102/54 (!) 106/56  Pulse: 80 (!) 106 (!) 109 72  Resp: 18 16 16 17   Temp: 98 F (36.7 C) 97.8 F (36.6 C) 97.9 F (36.6 C) 98.2 F (36.8 C)  TempSrc: Oral   Oral  SpO2: 97% 98% 99% 97%  Weight:      Height:        Intake/Output Summary (Last 24 hours) at 01/27/2021 1210 Last data filed at 01/27/2021 0532 Gross per 24 hour  Intake 490 ml  Output 610 ml  Net -120 ml   Filed Weights   01/23/21 1304 01/24/21 1618 01/25/21 1015  Weight: 97.5 kg 108.1 kg 108.1 kg    Examination:  General exam: No acute distress.  Sitting up in chair Respiratory system: Clear to auscultation. Respiratory effort normal. Cardiovascular system: S1-S2, regular rate, irregular rhythm, no murmurs, no pedal edema gastrointestinal system: Soft, NT/ND, right-sided percutaneous cholecystostomy Central nervous system: Alert and oriented. No focal neurological deficits. Extremities: Symmetric 5 x 5 power. Skin: No rashes, lesions or ulcers Psychiatry: Judgement and insight appear normal. Mood & affect appropriate.     Data Reviewed: I have personally reviewed following labs and imaging studies  CBC: Recent Labs  Lab 01/23/21 1058 01/24/21 0609 01/25/21 0535 01/26/21 0945  WBC 6.7 5.3 4.1 6.4  NEUTROABS 6.0  --  3.2 5.4  HGB 9.6* 8.8* 9.1* 9.8*  HCT 30.4* 26.6* 27.9* 31.8*  MCV 90.2 88.7 86.9 90.3  PLT 125* 128* 148* 387   Basic Metabolic Panel: Recent Labs  Lab 01/23/21 1058 01/24/21 0609 01/25/21 0535 01/26/21 0945  NA 134* 137 139 139  K 4.0 3.9 4.1 4.3  CL 102 109 110 108  CO2 19* 18* 18* 17*  GLUCOSE 121* 117* 127* 205*  BUN 70* 83* 99* 109*  CREATININE 7.73* 7.81* 8.37* 8.58*  CALCIUM 8.0* 8.3* 8.2* 8.2*  MG  --  2.0  --   --    PHOS  --   --  6.7*  --    GFR: Estimated Creatinine Clearance: 9 mL/min (A) (by C-G formula based on SCr of 8.58 mg/dL (H)). Liver Function Tests: Recent Labs  Lab 01/23/21 1058 01/24/21 0609 01/25/21 0535  AST 682* 303* 146*  ALT 189* 162* 131*  ALKPHOS 32* 31* 28*  BILITOT 0.8 0.6 0.4  PROT 6.2* 5.6* 5.3*  ALBUMIN 2.8* 2.5* 2.2*  2.4*   Recent Labs  Lab 01/23/21 1330  LIPASE 34   No results for input(s): AMMONIA in the last 168 hours. Coagulation Profile: Recent Labs  Lab 01/25/21 0535  INR 1.3*   Cardiac Enzymes: No results for input(s): CKTOTAL, CKMB, CKMBINDEX, TROPONINI in the last 168 hours. BNP (last 3 results) No results for input(s): PROBNP in the last 8760 hours. HbA1C: Recent Labs    01/25/21 0535  HGBA1C 5.6   CBG: Recent Labs  Lab 01/26/21 1123 01/26/21 1651 01/26/21 2042 01/27/21 0758 01/27/21 1147  GLUCAP 177* 164* 154* 131* 145*   Lipid Profile: No results for input(s): CHOL, HDL, LDLCALC, TRIG, CHOLHDL, LDLDIRECT in the last 72 hours. Thyroid Function Tests: No results for input(s): TSH, T4TOTAL, FREET4, T3FREE, THYROIDAB in the last 72 hours.  Anemia Panel: No results for input(s): VITAMINB12, FOLATE, FERRITIN, TIBC, IRON, RETICCTPCT in the last 72 hours.  Sepsis Labs: Recent Labs  Lab 01/23/21 1312 01/23/21 1751  LATICACIDVEN 2.7* 1.1    Recent Results (from the past 240 hour(s))  Blood  culture (routine x 2)     Status: Abnormal   Collection Time: 01/23/21  1:12 PM   Specimen: BLOOD  Result Value Ref Range Status   Specimen Description   Final    BLOOD RIGHT ANTECUBITAL Performed at [facility], [address] [place], [place] [address]    Special Requests   Final    BOTTLES DRAWN AEROBIC AND ANAEROBIC Blood Culture results may not be optimal due to an inadequate volume of blood received in culture bottles Performed at [facility], [address], [place], [place] [address]    Culture  Setup  Time   Final    GRAM NEGATIVE RODS IN BOTH AEROBIC AND ANAEROBIC BOTTLES CRITICAL RESULT CALLED TO, READ BACK BY AND VERIFIED WITH: NATHAN BLUE@0422  01/24/21 RH Performed at [facility], [address] [place], [place] [address]    Culture KLEBSIELLA OXYTOCA (A)  Final   Report Status 01/26/2021 FINAL  Final   Organism ID, Bacteria KLEBSIELLA OXYTOCA  Final      Susceptibility   Klebsiella oxytoca - MIC*    AMPICILLIN >=32 RESISTANT Resistant     CEFAZOLIN 8 SENSITIVE Sensitive     CEFEPIME <=0.12 SENSITIVE Sensitive     CEFTAZIDIME <=1 SENSITIVE Sensitive     CEFTRIAXONE <=0.25 SENSITIVE Sensitive     CIPROFLOXACIN <=0.25 SENSITIVE Sensitive     GENTAMICIN <=1 SENSITIVE Sensitive     IMIPENEM <=0.25 SENSITIVE Sensitive     TRIMETH/SULFA <=20 SENSITIVE Sensitive     AMPICILLIN/SULBACTAM 8 SENSITIVE Sensitive     PIP/TAZO <=4 SENSITIVE Sensitive     * KLEBSIELLA OXYTOCA  Blood Culture ID Panel (Reflexed)     Status: Abnormal   Collection Time: 01/23/21  1:12 PM  Result Value Ref Range Status   Enterococcus faecalis NOT DETECTED NOT DETECTED Final   Enterococcus Faecium NOT DETECTED NOT DETECTED Final   Listeria monocytogenes NOT DETECTED NOT DETECTED Final   Staphylococcus species NOT DETECTED NOT DETECTED Final   Staphylococcus aureus (BCID) NOT DETECTED NOT DETECTED Final   Staphylococcus epidermidis NOT DETECTED NOT DETECTED Final   Staphylococcus lugdunensis NOT DETECTED NOT DETECTED Final   Streptococcus species NOT DETECTED NOT DETECTED Final   Streptococcus agalactiae NOT DETECTED NOT DETECTED Final   Streptococcus pneumoniae NOT DETECTED NOT DETECTED Final   Streptococcus pyogenes NOT DETECTED NOT DETECTED Final   A.calcoaceticus-baumannii NOT DETECTED NOT DETECTED Final   Bacteroides fragilis NOT DETECTED NOT DETECTED Final   Enterobacterales DETECTED (A) NOT DETECTED Final    Comment: Enterobacterales represent a large order of gram negative bacteria, not  a single organism. CRITICAL RESULT CALLED TO, READ BACK BY AND VERIFIED WITH: NATHAN BLUE@0422  01/24/21 RH    Enterobacter cloacae complex NOT DETECTED NOT DETECTED Final   Escherichia coli NOT DETECTED NOT DETECTED Final   Klebsiella aerogenes NOT DETECTED NOT DETECTED Final   Klebsiella oxytoca DETECTED (A) NOT DETECTED Final    Comment: CRITICAL RESULT CALLED TO, READ BACK BY AND VERIFIED WITH: NATHAN BLUE@0422  01/24/21 RH    Klebsiella pneumoniae NOT DETECTED NOT DETECTED Final   Proteus species NOT DETECTED NOT DETECTED Final   Salmonella species NOT DETECTED NOT DETECTED Final   Serratia marcescens NOT DETECTED NOT DETECTED Final   Haemophilus influenzae NOT DETECTED NOT DETECTED Final   Neisseria meningitidis NOT DETECTED NOT DETECTED Final   Pseudomonas aeruginosa NOT DETECTED NOT DETECTED Final   Stenotrophomonas maltophilia NOT DETECTED NOT DETECTED Final   Candida albicans NOT DETECTED NOT DETECTED  Final   Candida auris NOT DETECTED NOT DETECTED Final   Candida glabrata NOT DETECTED NOT DETECTED Final   Candida krusei NOT DETECTED NOT DETECTED Final   Candida parapsilosis NOT DETECTED NOT DETECTED Final   Candida tropicalis NOT DETECTED NOT DETECTED Final   Cryptococcus neoformans/gattii NOT DETECTED NOT DETECTED Final   CTX-M ESBL NOT DETECTED NOT DETECTED Final   Carbapenem resistance IMP NOT DETECTED NOT DETECTED Final   Carbapenem resistance KPC NOT DETECTED NOT DETECTED Final   Carbapenem resistance NDM NOT DETECTED NOT DETECTED Final   Carbapenem resist OXA 48 LIKE NOT DETECTED NOT DETECTED Final   Carbapenem resistance VIM NOT DETECTED NOT DETECTED Final    Comment: Performed at [facility], [address] [place], [place] [address]  Blood culture (routine x 2)     Status: None (Preliminary result)   Collection Time: 01/23/21  1:17 PM   Specimen: BLOOD  Result Value Ref Range Status   Specimen Description BLOOD BLOOD RIGHT FOREARM  Final   Special  Requests   Final    BOTTLES DRAWN AEROBIC AND ANAEROBIC Blood Culture results may not be optimal due to an inadequate volume of blood received in culture bottles   Culture   Final    NO GROWTH 4 DAYS Performed at [facility], [address] [place], [place] [address]    Report Status PENDING  Incomplete  Resp Panel by RT-PCR (Flu A&B, Covid) Nasopharyngeal Swab     Status: None   Collection Time: 01/23/21  4:49 PM   Specimen: Nasopharyngeal Swab; Nasopharyngeal(NP) swabs in vial transport medium  Result Value Ref Range Status   SARS Coronavirus 2 by RT PCR NEGATIVE NEGATIVE Final    Comment: (NOTE) SARS-CoV-2 target nucleic acids are NOT DETECTED.  The SARS-CoV-2 RNA is generally detectable in upper respiratory specimens during the acute phase of infection. The lowest concentration of SARS-CoV-2 viral copies this assay can detect is 138 copies/mL. A negative result does not preclude SARS-Cov-2 infection and should not be used as the sole basis for treatment or other patient management decisions. A negative result may occur with  improper specimen collection/handling, submission of specimen other than nasopharyngeal swab, presence of viral mutation(s) within the areas targeted by this assay, and inadequate number of viral copies(<138 copies/mL). A negative result must be combined with clinical observations, patient history, and epidemiological information. The expected result is Negative.  Fact Sheet for Patients:  EntrepreneurPulse.com.au  Fact Sheet for Healthcare Providers:  IncredibleEmployment.be  This test is no t yet approved or cleared by the [place] FDA and  has been authorized for detection and/or diagnosis of SARS-CoV-2 by FDA under an Emergency Use Authorization (EUA). This EUA will remain  in effect (meaning this test can be used) for the duration of the COVID-19 declaration under Section 564(b)(1) of the Act,  21 U.S.C.section 360bbb-3(b)(1), unless the authorization is terminated  or revoked sooner.       Influenza A by PCR NEGATIVE NEGATIVE Final   Influenza B by PCR NEGATIVE NEGATIVE Final    Comment: (NOTE) The Xpert Xpress SARS-CoV-2/FLU/RSV plus assay is intended as an aid in the diagnosis of influenza from Nasopharyngeal swab specimens and should not be used as a sole basis for treatment. Nasal washings and aspirates are unacceptable for Xpert Xpress SARS-CoV-2/FLU/RSV testing.  Fact Sheet for Patients: EntrepreneurPulse.com.au  Fact Sheet for Healthcare Providers: IncredibleEmployment.be  This test is not yet approved or cleared by the [place] FDA and has  been authorized for detection and/or diagnosis of SARS-CoV-2 by FDA under an Emergency Use Authorization (EUA). This EUA will remain in effect (meaning this test can be used) for the duration of the COVID-19 declaration under Section 564(b)(1) of the Act, 21 U.S.C. section 360bbb-3(b)(1), unless the authorization is terminated or revoked.  Performed at [facility], [address] [place], [place] [address]   Aerobic/Anaerobic Culture w Gram Stain (surgical/deep wound)     Status: None (Preliminary result)   Collection Time: 01/25/21 11:39 AM   Specimen: BILE  Result Value Ref Range Status   Specimen Description   Final    BILE Performed at [facility], [address], [place], [place] [address]    Special Requests   Final    NONE Performed at [facility], [place], [place] [address]    Gram Stain   Final    NO SQUAMOUS EPITHELIAL CELLS SEEN FEW NO WBC SEEN FEW GRAM POSITIVE RODS MODERATE GRAM POSITIVE COCCI Performed at [facility], [place] [address], [place],  [address]    Culture PENDING  Incomplete   Report Status PENDING  Incomplete         Radiology Studies: No results found.      Scheduled  Meds:  carvedilol  3.125 mg Oral BID WC   diltiazem  120 mg Oral Daily   heparin  5,000 Units Subcutaneous Q8H   insulin aspart  0-5 Units Subcutaneous QHS   insulin aspart  0-9 Units Subcutaneous TID WC   isosorbide mononitrate  30 mg Oral QPM   multivitamin with minerals  1 tablet Oral Daily   pantoprazole  40 mg Oral Daily   sodium bicarbonate  650 mg Oral TID   sodium chloride flush  5 mL Intracatheter Q8H   vitamin B-12  1,000 mcg Oral Daily   Continuous Infusions:  sodium chloride 50 mL/hr at 01/26/21 1220   piperacillin-tazobactam (ZOSYN)  IV 2.25 g (01/27/21 0535)     LOS: 3 days    Time spent: 25 minutes    Sidney Ace, MD [facility]   If 7PM-7AM, please contact night-coverage  01/27/2021, 12:10 PM

## 2021-01-27 NOTE — H&P (View-Only) (Signed)
Reason for Consult: Placement of permacath for dialysis Referring Physician: Ralene Muskrat, MD  Gregory Dixon is an 80 y.o. male.  HPI: Patient with progressively deteriorating renal function.  Patient is in need for hemodialysis and a permacath has been requested by the attending physician.  Past Medical History:  Diagnosis Date   Actinic keratosis    Basal cell carcinoma 10/21/2017   left lateral neck, excised 02/09/2018   Basal cell carcinoma (BCC) 05/12/2019   right posterior ear, excised 05/31/2019   Blood transfusion without reported diagnosis    Diabetes mellitus without complication [facility])     Past Surgical History:  Procedure Laterality Date   COLONOSCOPY WITH PROPOFOL N/A 04/29/2019   Procedure: COLONOSCOPY WITH PROPOFOL;  Surgeon: Jonathon Bellows, MD;  Location: [facility] ENDOSCOPY;  Service: Gastroenterology;  Laterality: N/A;   ESOPHAGOGASTRODUODENOSCOPY (EGD) WITH PROPOFOL N/A 04/29/2019   Procedure: ESOPHAGOGASTRODUODENOSCOPY (EGD) WITH PROPOFOL;  Surgeon: Jonathon Bellows, MD;  Location: [facility] ENDOSCOPY;  Service: Gastroenterology;  Laterality: N/A;   IR PERC CHOLECYSTOSTOMY  01/25/2021    History reviewed. No pertinent family history.  Social History:  reports that he has quit smoking. He has never used smokeless tobacco. He reports that he does not currently use alcohol. He reports that he does not use drugs.  Allergies:  Allergies  Allergen Reactions   Aspirin Other (See Comments)    GI bleed   Codeine Nausea Only and Nausea And Vomiting    Medications: I have reviewed the patient's current medications. Prior to Admission:  Medications Prior to Admission  Medication Sig Dispense Refill Last Dose   carvedilol (COREG) 3.125 MG tablet Take 1 tablet (3.125 mg total) by mouth 2 (two) times daily with a meal. 60 tablet 0 01/23/2021   fenofibrate 160 MG tablet Take 160 mg by mouth at bedtime.      ferrous sulfate 325 (65 FE) MG tablet Take 325 mg by mouth 2 (two) times daily  with a meal.   01/23/2021   hydrALAZINE (APRESOLINE) 50 MG tablet Take 1 tablet (50 mg total) by mouth 2 (two) times daily. 60 tablet 0 01/23/2021   insulin NPH-regular Human (70-30) 100 UNIT/ML injection Inject 55 Units into the skin 2 (two) times daily with a meal.      isosorbide mononitrate (IMDUR) 30 MG 24 hr tablet Take 30 mg by mouth at bedtime.   01/23/2021   losartan (COZAAR) 25 MG tablet Take 25 mg by mouth daily.      Multiple Vitamin (MULTIVITAMIN) capsule Take 1 capsule by mouth daily.   01/23/2021   olmesartan-hydrochlorothiazide (BENICAR HCT) 40-25 MG tablet Take 1 tablet by mouth daily.   01/23/2021   terazosin (HYTRIN) 10 MG capsule Take 10 mg by mouth at bedtime.   01/22/2021   vitamin B-12 (CYANOCOBALAMIN) 1000 MCG tablet Take 1 tablet (1,000 mcg total) by mouth daily. 30 tablet 0 01/23/2021   olmesartan (BENICAR) 40 MG tablet Take 1 tablet (40 mg total) by mouth daily. (Patient not taking: Reported on 01/23/2021) 30 tablet 0 Not Taking   pantoprazole (PROTONIX) 40 MG tablet Take 1 tablet (40 mg total) by mouth daily. 30 tablet 0    Scheduled:  carvedilol  3.125 mg Oral BID WC   diltiazem  120 mg Oral Daily   heparin  5,000 Units Subcutaneous Q8H   insulin aspart  0-5 Units Subcutaneous QHS   insulin aspart  0-9 Units Subcutaneous TID WC   isosorbide mononitrate  30 mg Oral QPM   multivitamin with minerals  1 tablet Oral Daily   pantoprazole  40 mg Oral Daily   sodium bicarbonate  650 mg Oral TID   sodium chloride flush  5 mL Intracatheter Q8H   vitamin B-12  1,000 mcg Oral Daily   Continuous:  sodium chloride 50 mL/hr at 01/26/21 1220   piperacillin-tazobactam (ZOSYN)  IV 2.25 g (01/27/21 0535)    Results for orders placed or performed during the hospital encounter of 01/23/21 (from the past 48 hour(s))  Glucose, capillary     Status: Abnormal   Collection Time: 01/25/21  4:59 PM  Result Value Ref Range   Glucose-Capillary 164 (H) 70 - 99 mg/dL    Comment:  Glucose reference range applies only to samples taken after fasting for at least 8 hours.  Glucose, capillary     Status: Abnormal   Collection Time: 01/25/21  8:51 PM  Result Value Ref Range   Glucose-Capillary 115 (H) 70 - 99 mg/dL    Comment: Glucose reference range applies only to samples taken after fasting for at least 8 hours.  Glucose, capillary     Status: Abnormal   Collection Time: 01/26/21  8:05 AM  Result Value Ref Range   Glucose-Capillary 129 (H) 70 - 99 mg/dL    Comment: Glucose reference range applies only to samples taken after fasting for at least 8 hours.  CBC with Differential/Platelet     Status: Abnormal   Collection Time: 01/26/21  9:45 AM  Result Value Ref Range   WBC 6.4 4.0 - 10.5 K/uL   RBC 3.52 (L) 4.22 - 5.81 MIL/uL   Hemoglobin 9.8 (L) 13.0 - 17.0 g/dL   HCT 31.8 (L) 39.0 - 52.0 %   MCV 90.3 80.0 - 100.0 fL   MCH 27.8 26.0 - 34.0 pg   MCHC 30.8 30.0 - 36.0 g/dL   RDW 15.1 11.5 - 15.5 %   Platelets 182 150 - 400 K/uL   nRBC 0.0 0.0 - 0.2 %   Neutrophils Relative % 84 %   Neutro Abs 5.4 1.7 - 7.7 K/uL   Lymphocytes Relative 8 %   Lymphs Abs 0.5 (L) 0.7 - 4.0 K/uL   Monocytes Relative 6 %   Monocytes Absolute 0.4 0.1 - 1.0 K/uL   Eosinophils Relative 1 %   Eosinophils Absolute 0.0 0.0 - 0.5 K/uL   Basophils Relative 0 %   Basophils Absolute 0.0 0.0 - 0.1 K/uL   Immature Granulocytes 1 %   Abs Immature Granulocytes 0.04 0.00 - 0.07 K/uL    Comment: Performed at [facility], [address] [place], [place] [address]  Basic metabolic panel     Status: Abnormal   Collection Time: 01/26/21  9:45 AM  Result Value Ref Range   Sodium 139 135 - 145 mmol/L   Potassium 4.3 3.5 - 5.1 mmol/L   Chloride 108 98 - 111 mmol/L   CO2 17 (L) 22 - 32 mmol/L   Glucose, Bld 205 (H) 70 - 99 mg/dL    Comment: Glucose reference range applies only to samples taken after fasting for at least 8 hours.   BUN 109 (H) 8 - 23 mg/dL    Comment: RESULT CONFIRMED BY  MANUAL DILUTION. QSD   Creatinine, Ser 8.58 (H) 0.61 - 1.24 mg/dL   Calcium 8.2 (L) 8.9 - 10.3 mg/dL   GFR, Estimated 6 (L) >60 mL/min    Comment: (NOTE) Calculated using the CKD-EPI Creatinine Equation (2021)    Anion gap 14 5 - 15  Comment: Performed at [facility], [address] [place], [place] [address]  Glucose, capillary     Status: Abnormal   Collection Time: 01/26/21 11:23 AM  Result Value Ref Range   Glucose-Capillary 177 (H) 70 - 99 mg/dL    Comment: Glucose reference range applies only to samples taken after fasting for at least 8 hours.  Glucose, capillary     Status: Abnormal   Collection Time: 01/26/21  4:51 PM  Result Value Ref Range   Glucose-Capillary 164 (H) 70 - 99 mg/dL    Comment: Glucose reference range applies only to samples taken after fasting for at least 8 hours.  Glucose, capillary     Status: Abnormal   Collection Time: 01/26/21  8:42 PM  Result Value Ref Range   Glucose-Capillary 154 (H) 70 - 99 mg/dL    Comment: Glucose reference range applies only to samples taken after fasting for at least 8 hours.  Glucose, capillary     Status: Abnormal   Collection Time: 01/27/21  7:58 AM  Result Value Ref Range   Glucose-Capillary 131 (H) 70 - 99 mg/dL    Comment: Glucose reference range applies only to samples taken after fasting for at least 8 hours.  Glucose, capillary     Status: Abnormal   Collection Time: 01/27/21 11:47 AM  Result Value Ref Range   Glucose-Capillary 145 (H) 70 - 99 mg/dL    Comment: Glucose reference range applies only to samples taken after fasting for at least 8 hours.    No results found.  Review of Systems  All other systems reviewed and are negative. Blood pressure (!) 106/56, pulse 72, temperature 98.2 F (36.8 C), temperature source Oral, resp. rate 17, height 6\' 6"  (1.981 m), weight 108.1 kg, SpO2 97 %. Physical Exam Constitutional:      General: He is not in acute distress.    Appearance: Normal  appearance. He is normal weight. He is not ill-appearing, toxic-appearing or diaphoretic.  HENT:     Head: Normocephalic and atraumatic.     Nose: Nose normal.     Mouth/Throat:     Mouth: Mucous membranes are dry.  Eyes:     Extraocular Movements: Extraocular movements intact.     Conjunctiva/sclera: Conjunctivae normal.     Pupils: Pupils are equal, round, and reactive to light.  Cardiovascular:     Rate and Rhythm: Normal rate and regular rhythm.  Abdominal:     General: Abdomen is flat. Bowel sounds are normal. There is no distension.     Palpations: Abdomen is soft. There is no mass.     Tenderness: There is no abdominal tenderness.     Hernia: No hernia is present.  Musculoskeletal:        General: No swelling or tenderness. Normal range of motion.     Cervical back: Normal range of motion and neck supple. No rigidity or tenderness.  Skin:    General: Skin is warm and dry.     Capillary Refill: Capillary refill takes less than 2 seconds.     Coloration: Skin is not jaundiced or pale.  Neurological:     General: No focal deficit present.     Mental Status: He is alert and oriented to person, place, and time. Mental status is at baseline.  Psychiatric:        Mood and Affect: Mood normal.        Thought Content: Thought content normal.  Judgment: Judgment normal.    Assessment/Plan: This is a 80 year old male patient with end-stage renal disease and multiple other medical problems including type 2 diabetes, hypertension, hyperlipidemia.  I will plan to put the patient on the schedule for permacath placement and discussed all the risks and benefits of the procedure with the patient who agrees with the procedure. He will be placed n.p.o. after midnight. Elmore Guise 01/27/2021, 1:49 PM

## 2021-01-27 NOTE — Progress Notes (Signed)
[facility]  PROGRESS NOTE   Subjective:   Patient is out of bed to chair. Denies any chest pain, shortness of breath or orthopnea  Objective:  Vital signs in last 24 hours:  Temp:  [97.8 F (36.6 C)-98.2 F (36.8 C)] 98.2 F (36.8 C) (10/23 1146) Pulse Rate:  [72-109] 72 (10/23 1146) Resp:  [16-18] 17 (10/23 1146) BP: (102-114)/(54-75) 106/56 (10/23 1146) SpO2:  [95 %-99 %] 97 % (10/23 1146)  Weight change:  Filed Weights   01/23/21 1304 01/24/21 1618 01/25/21 1015  Weight: 97.5 kg 108.1 kg 108.1 kg    Intake/Output: I/O last 3 completed shifts: In: 36 [P.O.:720; Other:10] Out: 1110 [Urine:500; Drains:610]   Intake/Output this shift:  No intake/output data recorded.  Physical Exam: General:  No acute distress  Head:  Normocephalic, atraumatic. Moist oral mucosal membranes  Eyes:  Anicteric  Neck:  Supple  Lungs:   Clear to auscultation, normal effort  Heart:  S1S2 no rubs  Abdomen:   Soft, nontender, bowel sounds present  Extremities:  peripheral edema.  Neurologic:  Awake, alert, following commands  Skin:  No lesions  Access:     Basic Metabolic Panel: Recent Labs  Lab 01/23/21 1058 01/24/21 0609 01/25/21 0535 01/26/21 0945  NA 134* 137 139 139  K 4.0 3.9 4.1 4.3  CL 102 109 110 108  CO2 19* 18* 18* 17*  GLUCOSE 121* 117* 127* 205*  BUN 70* 83* 99* 109*  CREATININE 7.73* 7.81* 8.37* 8.58*  CALCIUM 8.0* 8.3* 8.2* 8.2*  MG  --  2.0  --   --   PHOS  --   --  6.7*  --     CBC: Recent Labs  Lab 01/23/21 1058 01/24/21 0609 01/25/21 0535 01/26/21 0945  WBC 6.7 5.3 4.1 6.4  NEUTROABS 6.0  --  3.2 5.4  HGB 9.6* 8.8* 9.1* 9.8*  HCT 30.4* 26.6* 27.9* 31.8*  MCV 90.2 88.7 86.9 90.3  PLT 125* 128* 148* 182     Urinalysis: No results for input(s): COLORURINE, LABSPEC, PHURINE, GLUCOSEU, HGBUR, BILIRUBINUR, KETONESUR, PROTEINUR, UROBILINOGEN, NITRITE, LEUKOCYTESUR in the last 72 hours.  Invalid input(s): APPERANCEUR     Imaging: No results found.   Medications:    sodium chloride 50 mL/hr at 01/26/21 1220   piperacillin-tazobactam (ZOSYN)  IV 2.25 g (01/27/21 0535)    carvedilol  3.125 mg Oral BID WC   diltiazem  120 mg Oral Daily   heparin  5,000 Units Subcutaneous Q8H   insulin aspart  0-5 Units Subcutaneous QHS   insulin aspart  0-9 Units Subcutaneous TID WC   isosorbide mononitrate  30 mg Oral QPM   multivitamin with minerals  1 tablet Oral Daily   pantoprazole  40 mg Oral Daily   sodium bicarbonate  650 mg Oral TID   sodium chloride flush  5 mL Intracatheter Q8H   vitamin B-12  1,000 mcg Oral Daily    Assessment/ Plan:     Principal Problem:   Cholelithiasis Active Problems:   Benign prostatic hyperplasia   Chronic kidney disease   Diabetic peripheral neuropathy associated with type 2 diabetes mellitus (HCC)   Hyperlipidemia   Anemia associated with chronic renal failure   Weakness   Elevated LFTs   Transaminitis   Cholecystitis  80 year old white male with a history of diabetes, peripheral vascular disease, chronic kidney disease stage IV, hypertension, hyperlipidemia, coronary artery disease and anemia now admitted with history of worsening weakness, loss of appetite and some abdominal  pain.  He is found to have acute cholecystitis.  He is now s/p cholecystotomy.   #1: Acute kidney injury on CKD: Patient has acute kidney injury which has been worsening since admission.  Presently has a GFR of less than 10 cc/min.  Spoke to the patient and his wife at bedside in detail and explained to them on the need for initiation of dialysis.  They are both agreeable for the same.  We will have the permacath placed on Monday and initiate dialysis the same day.     #2: Acute cholecystitis: Patient has been on IV antibiotics.  He also had a cholecystotomy placed.  The drains are patent.   #3: Diabetes: Continue insulin as ordered.   #4: Anemia/MGUS: Anemia is possibly secondary to chronic  kidney disease.  Will initiate erythropoietin supplementation.   #5: Metabolic acidosis: Acidosis secondary to sepsis/chronic kidney disease.  We will continue the sodium bicarbonate orally.   #6: Atrial fibrillation: Continue to monitor closely on diltiazem.   Case discussed with PMD and vascular.   LOS: [place], [facility] 10/23/202212:50 PM

## 2021-01-27 NOTE — Consult Note (Signed)
Reason for Consult: Placement of permacath for dialysis Referring Physician: Ralene Muskrat, MD  Gregory Dixon is an 80 y.o. male.  HPI: Patient with progressively deteriorating renal function.  Patient is in need for hemodialysis and a permacath has been requested by the attending physician.  Past Medical History:  Diagnosis Date   Actinic keratosis    Basal cell carcinoma 10/21/2017   left lateral neck, excised 02/09/2018   Basal cell carcinoma (BCC) 05/12/2019   right posterior ear, excised 05/31/2019   Blood transfusion without reported diagnosis    Diabetes mellitus without complication [facility])     Past Surgical History:  Procedure Laterality Date   COLONOSCOPY WITH PROPOFOL N/A 04/29/2019   Procedure: COLONOSCOPY WITH PROPOFOL;  Surgeon: Jonathon Bellows, MD;  Location: [facility] ENDOSCOPY;  Service: Gastroenterology;  Laterality: N/A;   ESOPHAGOGASTRODUODENOSCOPY (EGD) WITH PROPOFOL N/A 04/29/2019   Procedure: ESOPHAGOGASTRODUODENOSCOPY (EGD) WITH PROPOFOL;  Surgeon: Jonathon Bellows, MD;  Location: [facility] ENDOSCOPY;  Service: Gastroenterology;  Laterality: N/A;   IR PERC CHOLECYSTOSTOMY  01/25/2021    History reviewed. No pertinent family history.  Social History:  reports that he has quit smoking. He has never used smokeless tobacco. He reports that he does not currently use alcohol. He reports that he does not use drugs.  Allergies:  Allergies  Allergen Reactions   Aspirin Other (See Comments)    GI bleed   Codeine Nausea Only and Nausea And Vomiting    Medications: I have reviewed the patient's current medications. Prior to Admission:  Medications Prior to Admission  Medication Sig Dispense Refill Last Dose   carvedilol (COREG) 3.125 MG tablet Take 1 tablet (3.125 mg total) by mouth 2 (two) times daily with a meal. 60 tablet 0 01/23/2021   fenofibrate 160 MG tablet Take 160 mg by mouth at bedtime.      ferrous sulfate 325 (65 FE) MG tablet Take 325 mg by mouth 2 (two) times daily  with a meal.   01/23/2021   hydrALAZINE (APRESOLINE) 50 MG tablet Take 1 tablet (50 mg total) by mouth 2 (two) times daily. 60 tablet 0 01/23/2021   insulin NPH-regular Human (70-30) 100 UNIT/ML injection Inject 55 Units into the skin 2 (two) times daily with a meal.      isosorbide mononitrate (IMDUR) 30 MG 24 hr tablet Take 30 mg by mouth at bedtime.   01/23/2021   losartan (COZAAR) 25 MG tablet Take 25 mg by mouth daily.      Multiple Vitamin (MULTIVITAMIN) capsule Take 1 capsule by mouth daily.   01/23/2021   olmesartan-hydrochlorothiazide (BENICAR HCT) 40-25 MG tablet Take 1 tablet by mouth daily.   01/23/2021   terazosin (HYTRIN) 10 MG capsule Take 10 mg by mouth at bedtime.   01/22/2021   vitamin B-12 (CYANOCOBALAMIN) 1000 MCG tablet Take 1 tablet (1,000 mcg total) by mouth daily. 30 tablet 0 01/23/2021   olmesartan (BENICAR) 40 MG tablet Take 1 tablet (40 mg total) by mouth daily. (Patient not taking: Reported on 01/23/2021) 30 tablet 0 Not Taking   pantoprazole (PROTONIX) 40 MG tablet Take 1 tablet (40 mg total) by mouth daily. 30 tablet 0    Scheduled:  carvedilol  3.125 mg Oral BID WC   diltiazem  120 mg Oral Daily   heparin  5,000 Units Subcutaneous Q8H   insulin aspart  0-5 Units Subcutaneous QHS   insulin aspart  0-9 Units Subcutaneous TID WC   isosorbide mononitrate  30 mg Oral QPM   multivitamin with minerals  1 tablet Oral Daily   pantoprazole  40 mg Oral Daily   sodium bicarbonate  650 mg Oral TID   sodium chloride flush  5 mL Intracatheter Q8H   vitamin B-12  1,000 mcg Oral Daily   Continuous:  sodium chloride 50 mL/hr at 01/26/21 1220   piperacillin-tazobactam (ZOSYN)  IV 2.25 g (01/27/21 0535)    Results for orders placed or performed during the hospital encounter of 01/23/21 (from the past 48 hour(s))  Glucose, capillary     Status: Abnormal   Collection Time: 01/25/21  4:59 PM  Result Value Ref Range   Glucose-Capillary 164 (H) 70 - 99 mg/dL    Comment:  Glucose reference range applies only to samples taken after fasting for at least 8 hours.  Glucose, capillary     Status: Abnormal   Collection Time: 01/25/21  8:51 PM  Result Value Ref Range   Glucose-Capillary 115 (H) 70 - 99 mg/dL    Comment: Glucose reference range applies only to samples taken after fasting for at least 8 hours.  Glucose, capillary     Status: Abnormal   Collection Time: 01/26/21  8:05 AM  Result Value Ref Range   Glucose-Capillary 129 (H) 70 - 99 mg/dL    Comment: Glucose reference range applies only to samples taken after fasting for at least 8 hours.  CBC with Differential/Platelet     Status: Abnormal   Collection Time: 01/26/21  9:45 AM  Result Value Ref Range   WBC 6.4 4.0 - 10.5 K/uL   RBC 3.52 (L) 4.22 - 5.81 MIL/uL   Hemoglobin 9.8 (L) 13.0 - 17.0 g/dL   HCT 31.8 (L) 39.0 - 52.0 %   MCV 90.3 80.0 - 100.0 fL   MCH 27.8 26.0 - 34.0 pg   MCHC 30.8 30.0 - 36.0 g/dL   RDW 15.1 11.5 - 15.5 %   Platelets 182 150 - 400 K/uL   nRBC 0.0 0.0 - 0.2 %   Neutrophils Relative % 84 %   Neutro Abs 5.4 1.7 - 7.7 K/uL   Lymphocytes Relative 8 %   Lymphs Abs 0.5 (L) 0.7 - 4.0 K/uL   Monocytes Relative 6 %   Monocytes Absolute 0.4 0.1 - 1.0 K/uL   Eosinophils Relative 1 %   Eosinophils Absolute 0.0 0.0 - 0.5 K/uL   Basophils Relative 0 %   Basophils Absolute 0.0 0.0 - 0.1 K/uL   Immature Granulocytes 1 %   Abs Immature Granulocytes 0.04 0.00 - 0.07 K/uL    Comment: Performed at [facility], [address] [place], [place] [address]  Basic metabolic panel     Status: Abnormal   Collection Time: 01/26/21  9:45 AM  Result Value Ref Range   Sodium 139 135 - 145 mmol/L   Potassium 4.3 3.5 - 5.1 mmol/L   Chloride 108 98 - 111 mmol/L   CO2 17 (L) 22 - 32 mmol/L   Glucose, Bld 205 (H) 70 - 99 mg/dL    Comment: Glucose reference range applies only to samples taken after fasting for at least 8 hours.   BUN 109 (H) 8 - 23 mg/dL    Comment: RESULT CONFIRMED BY  MANUAL DILUTION. QSD   Creatinine, Ser 8.58 (H) 0.61 - 1.24 mg/dL   Calcium 8.2 (L) 8.9 - 10.3 mg/dL   GFR, Estimated 6 (L) >60 mL/min    Comment: (NOTE) Calculated using the CKD-EPI Creatinine Equation (2021)    Anion gap 14 5 - 15  Comment: Performed at [facility], [address] [place], [place] [address]  Glucose, capillary     Status: Abnormal   Collection Time: 01/26/21 11:23 AM  Result Value Ref Range   Glucose-Capillary 177 (H) 70 - 99 mg/dL    Comment: Glucose reference range applies only to samples taken after fasting for at least 8 hours.  Glucose, capillary     Status: Abnormal   Collection Time: 01/26/21  4:51 PM  Result Value Ref Range   Glucose-Capillary 164 (H) 70 - 99 mg/dL    Comment: Glucose reference range applies only to samples taken after fasting for at least 8 hours.  Glucose, capillary     Status: Abnormal   Collection Time: 01/26/21  8:42 PM  Result Value Ref Range   Glucose-Capillary 154 (H) 70 - 99 mg/dL    Comment: Glucose reference range applies only to samples taken after fasting for at least 8 hours.  Glucose, capillary     Status: Abnormal   Collection Time: 01/27/21  7:58 AM  Result Value Ref Range   Glucose-Capillary 131 (H) 70 - 99 mg/dL    Comment: Glucose reference range applies only to samples taken after fasting for at least 8 hours.  Glucose, capillary     Status: Abnormal   Collection Time: 01/27/21 11:47 AM  Result Value Ref Range   Glucose-Capillary 145 (H) 70 - 99 mg/dL    Comment: Glucose reference range applies only to samples taken after fasting for at least 8 hours.    No results found.  Review of Systems  All other systems reviewed and are negative. Blood pressure (!) 106/56, pulse 72, temperature 98.2 F (36.8 C), temperature source Oral, resp. rate 17, height 6\' 6"  (1.981 m), weight 108.1 kg, SpO2 97 %. Physical Exam Constitutional:      General: He is not in acute distress.    Appearance: Normal  appearance. He is normal weight. He is not ill-appearing, toxic-appearing or diaphoretic.  HENT:     Head: Normocephalic and atraumatic.     Nose: Nose normal.     Mouth/Throat:     Mouth: Mucous membranes are dry.  Eyes:     Extraocular Movements: Extraocular movements intact.     Conjunctiva/sclera: Conjunctivae normal.     Pupils: Pupils are equal, round, and reactive to light.  Cardiovascular:     Rate and Rhythm: Normal rate and regular rhythm.  Abdominal:     General: Abdomen is flat. Bowel sounds are normal. There is no distension.     Palpations: Abdomen is soft. There is no mass.     Tenderness: There is no abdominal tenderness.     Hernia: No hernia is present.  Musculoskeletal:        General: No swelling or tenderness. Normal range of motion.     Cervical back: Normal range of motion and neck supple. No rigidity or tenderness.  Skin:    General: Skin is warm and dry.     Capillary Refill: Capillary refill takes less than 2 seconds.     Coloration: Skin is not jaundiced or pale.  Neurological:     General: No focal deficit present.     Mental Status: He is alert and oriented to person, place, and time. Mental status is at baseline.  Psychiatric:        Mood and Affect: Mood normal.        Thought Content: Thought content normal.  Judgment: Judgment normal.    Assessment/Plan: This is a 80 year old male patient with end-stage renal disease and multiple other medical problems including type 2 diabetes, hypertension, hyperlipidemia.  I will plan to put the patient on the schedule for permacath placement and discussed all the risks and benefits of the procedure with the patient who agrees with the procedure. He will be placed n.p.o. after midnight. Elmore Guise 01/27/2021, 1:49 PM

## 2021-01-28 ENCOUNTER — Inpatient Hospital Stay
Admission: EM | Disposition: A | Discharge status: Home / Self Care | Payer: Self-pay | Source: Home / Self Care | Attending: Internal Medicine

## 2021-01-28 ENCOUNTER — Other Ambulatory Visit (INDEPENDENT_AMBULATORY_CARE_PROVIDER_SITE_OTHER): Payer: Self-pay | Admitting: Vascular Surgery

## 2021-01-28 ENCOUNTER — Other Ambulatory Visit: Payer: Medicare Other

## 2021-01-28 DIAGNOSIS — K8010 Calculus of gallbladder with chronic cholecystitis without obstruction: Secondary | ICD-10-CM | POA: Diagnosis not present

## 2021-01-28 DIAGNOSIS — N186 End stage renal disease: Secondary | ICD-10-CM

## 2021-01-28 HISTORY — PX: DIALYSIS/PERMA CATHETER INSERTION: CATH118288

## 2021-01-28 LAB — CULTURE, BLOOD (ROUTINE X 2)

## 2021-01-28 LAB — BASIC METABOLIC PANEL
Anion gap: 17 — ABNORMAL HIGH (ref 5–15)
BUN: 88 mg/dL — ABNORMAL HIGH (ref 8–23)
CO2: 17 mmol/L — ABNORMAL LOW (ref 22–32)
Calcium: 7.6 mg/dL — ABNORMAL LOW (ref 8.9–10.3)
Chloride: 106 mmol/L (ref 98–111)
Creatinine, Ser: 8.5 mg/dL — ABNORMAL HIGH (ref 0.61–1.24)
GFR, Estimated: 6 mL/min — ABNORMAL LOW (ref >60)
Glucose, Bld: 117 mg/dL — ABNORMAL HIGH (ref 70–99)
Potassium: 3.7 mmol/L (ref 3.5–5.1)
Sodium: 140 mmol/L (ref 135–145)

## 2021-01-28 LAB — GLUCOSE, CAPILLARY
Glucose-Capillary: 118 mg/dL — ABNORMAL HIGH (ref 70–99)
Glucose-Capillary: 119 mg/dL — ABNORMAL HIGH (ref 70–99)
Glucose-Capillary: 112 mg/dL — ABNORMAL HIGH (ref 70–99)

## 2021-01-28 SURGERY — DIALYSIS/PERMA CATHETER INSERTION
Anesthesia: Moderate Sedation | Laterality: Bilateral

## 2021-01-28 MED ORDER — SODIUM CHLORIDE 0.9 % IV SOLN: INTRAVENOUS | Status: DC

## 2021-01-28 MED ORDER — ONDANSETRON HCL 4 MG/2ML IJ SOLN: 4.0000 mg | Freq: Four times a day (QID) | INTRAMUSCULAR | Status: DC | PRN

## 2021-01-28 MED ORDER — HEPARIN SODIUM (PORCINE) 1000 UNIT/ML IJ SOLN
INTRAMUSCULAR | Status: AC
  Filled 2021-01-28: qty 1

## 2021-01-28 MED ORDER — HEPARIN SODIUM (PORCINE) 1000 UNIT/ML DIALYSIS: 1000.0000 [IU] | INTRAMUSCULAR | Status: DC | PRN

## 2021-01-28 MED ORDER — MIDAZOLAM HCL 2 MG/ML PO SYRP: 8.0000 mg | ORAL_SOLUTION | Freq: Once | ORAL | Status: DC | PRN

## 2021-01-28 MED ORDER — METHYLPREDNISOLONE SODIUM SUCC 125 MG IJ SOLR: 125.0000 mg | Freq: Once | INTRAMUSCULAR | Status: DC | PRN

## 2021-01-28 MED ORDER — MIDAZOLAM HCL 2 MG/2ML IJ SOLN
INTRAMUSCULAR | Status: DC | PRN
  Administered 2021-01-28: 2 mg via INTRAVENOUS

## 2021-01-28 MED ORDER — ALTEPLASE 2 MG IJ SOLR: 2.0000 mg | Freq: Once | INTRAMUSCULAR | Status: DC | PRN

## 2021-01-28 MED ORDER — CEFAZOLIN SODIUM-DEXTROSE 1-4 GM/50ML-% IV SOLN
INTRAVENOUS | Status: AC
  Administered 2021-01-28: 1 g via INTRAVENOUS
  Filled 2021-01-28: qty 50

## 2021-01-28 MED ORDER — SODIUM CHLORIDE 0.9 % IV SOLN: 100.0000 mL | INTRAVENOUS | Status: DC | PRN

## 2021-01-28 MED ORDER — FAMOTIDINE 20 MG PO TABS: 40.0000 mg | ORAL_TABLET | Freq: Once | ORAL | Status: DC | PRN

## 2021-01-28 MED ORDER — FENTANYL CITRATE PF 50 MCG/ML IJ SOSY
PREFILLED_SYRINGE | INTRAMUSCULAR | Status: AC
  Filled 2021-01-28: qty 2

## 2021-01-28 MED ORDER — LIDOCAINE-PRILOCAINE 2.5-2.5 % EX CREA: 1.0000 "application " | TOPICAL_CREAM | CUTANEOUS | Status: DC | PRN

## 2021-01-28 MED ORDER — MIDAZOLAM HCL 5 MG/5ML IJ SOLN
INTRAMUSCULAR | Status: AC
  Filled 2021-01-28: qty 5

## 2021-01-28 MED ORDER — PENTAFLUOROPROP-TETRAFLUOROETH EX AERO: 1.0000 "application " | INHALATION_SPRAY | CUTANEOUS | Status: DC | PRN

## 2021-01-28 MED ORDER — LIDOCAINE HCL (PF) 1 % IJ SOLN: 5.0000 mL | INTRAMUSCULAR | Status: DC | PRN

## 2021-01-28 MED ORDER — DILTIAZEM HCL 25 MG/5ML IV SOLN
10.0000 mg | Freq: Once | INTRAVENOUS | Status: AC
  Administered 2021-01-28: 10 mg via INTRAVENOUS
  Filled 2021-01-28: qty 5

## 2021-01-28 MED ORDER — CEFAZOLIN SODIUM-DEXTROSE 1-4 GM/50ML-% IV SOLN
INTRAVENOUS | Status: AC
  Filled 2021-01-28: qty 50

## 2021-01-28 MED ORDER — DIPHENHYDRAMINE HCL 50 MG/ML IJ SOLN: 50.0000 mg | Freq: Once | INTRAMUSCULAR | Status: DC | PRN

## 2021-01-28 MED ORDER — CHLORHEXIDINE GLUCONATE CLOTH 2 % EX PADS
6.0000 | MEDICATED_PAD | Freq: Every day | CUTANEOUS | Status: DC
  Administered 2021-01-28: 6 via TOPICAL

## 2021-01-28 MED ORDER — CEFAZOLIN SODIUM-DEXTROSE 1-4 GM/50ML-% IV SOLN: 1.0000 g | Freq: Once | INTRAVENOUS | Status: AC

## 2021-01-28 MED ORDER — HYDROMORPHONE HCL 1 MG/ML IJ SOLN
1.0000 mg | Freq: Once | INTRAMUSCULAR | Status: DC | PRN
Start: 2021-01-28 — End: 2021-01-31

## 2021-01-28 SURGICAL SUPPLY — 8 items
BIOPATCH RED 1 DISK 7.0 (GAUZE/BANDAGES/DRESSINGS) ×2 IMPLANT
CATH CANNON HEMO 15FR 23CM (HEMODIALYSIS SUPPLIES) ×2 IMPLANT
COVER PROBE U/S 5X48 (MISCELLANEOUS) ×2 IMPLANT
DERMABOND ADVANCED (GAUZE/BANDAGES/DRESSINGS) ×1
DERMABOND ADVANCED .7 DNX12 (GAUZE/BANDAGES/DRESSINGS) ×1 IMPLANT
PACK ANGIOGRAPHY (CUSTOM PROCEDURE TRAY) ×2 IMPLANT
SUT MNCRL AB 4-0 PS2 18 (SUTURE) ×2 IMPLANT
SUT PROLENE 0 CT 1 30 (SUTURE) ×2 IMPLANT

## 2021-01-28 NOTE — Op Note (Signed)
OPERATIVE NOTE    PRE-OPERATIVE DIAGNOSIS: 1. ESRD   POST-OPERATIVE DIAGNOSIS: same as above  PROCEDURE: Ultrasound guidance for vascular access to the right internal jugular vein Fluoroscopic guidance for placement of catheter Placement of a 23 cm tip to cuff tunneled hemodialysis catheter via the right internal jugular vein  SURGEON: Leotis Pain, MD  ANESTHESIA:  Local with Moderate conscious sedation for approximately 18 minutes using 2 mg of Versed and 50 mcg of Fentanyl  ESTIMATED BLOOD LOSS: 3 cc  FLUORO TIME: less than one minute  CONTRAST: none  FINDING(S): 1.  Patent right internal jugular vein  SPECIMEN(S):  None  INDICATIONS:   Gregory Dixon is a 80 y.o.male who presents with renal failure.  The patient needs long term dialysis access for their ESRD, and a Permcath is necessary.  Risks and benefits are discussed and informed consent is obtained.    DESCRIPTION: After obtaining full informed written consent, the patient was brought back to the vascular suited. The patient's right neck and chest were sterilely prepped and draped in a sterile surgical field was created. Moderate conscious sedation was administered during a face to face encounter with the patient throughout the procedure with my supervision of the RN administering medicines and monitoring the patient's vital signs, pulse oximetry, telemetry and mental status throughout from the start of the procedure until the patient was taken to the recovery room.  The right internal jugular vein was visualized with ultrasound and found to be patent. It was then accessed under direct ultrasound guidance and a permanent image was recorded. A wire was placed. After skin nick and dilatation, the peel-away sheath was placed over the wire. I then turned my attention to an area under the clavicle. Approximately 1-2 fingerbreadths below the clavicle a small counterincision was created and tunneled from the subclavicular incision to  the access site. Using fluoroscopic guidance, a 23 centimeter tip to cuff tunneled hemodialysis catheter was selected, and tunneled from the subclavicular incision to the access site. It was then placed through the peel-away sheath and the peel-away sheath was removed. Using fluoroscopic guidance the catheter tips were parked in the right atrium. The appropriate distal connectors were placed. It withdrew blood well and flushed easily with heparinized saline and a concentrated heparin solution was then placed. It was secured to the chest wall with 2 Prolene sutures. The access incision was closed single 4-0 Monocryl. A 4-0 Monocryl pursestring suture was placed around the exit site. Sterile dressings were placed. The patient tolerated the procedure well and was taken to the recovery room in stable condition.  COMPLICATIONS: None  CONDITION: Stable  Leotis Pain, MD 01/28/2021 3:54 PM   This note was created with Dragon Medical transcription system. Any errors in dictation are purely unintentional.

## 2021-01-28 NOTE — Progress Notes (Signed)
Patient initial HD treatment, tolerated well, CVC functioned without difficulty, maintained prescribed BFR. No fluid removal this session. VSS, afebrile. Catheter site intact. Patient scheduled for second session on 01/29/21. Patient returned to floor.

## 2021-01-28 NOTE — Care Management Important Message (Signed)
Important Message  Patient Details  Name: Gregory Dixon MRN: [id number] Date of Birth: 1941/04/01   Medicare Important Message Given:  Yes     Dannette Barbara 01/28/2021, 4:13 PM

## 2021-01-28 NOTE — Progress Notes (Signed)
[facility]  ROUNDING NOTE   Subjective:   Gregory Dixon is a 80 year old male with past medical history of B12 deficiency, hypertension, hyperlipidemia, and CKD.  Patient presents to the emergency room with complaints of weakness.  Admitted to observation status for Lactic acid increased [E87.20] Weakness [R53.1] RUQ pain [R10.11] Cholecystitis [K81.9]  Patient is known to our clinic from previous admissions.  He is a patient of Dr. Holley Raring, though has not been seen since April 2021.    Patient laying in bed Currently NPO for HD Permcath placement States he is not sure if he wants to do PD, may continue in-center dialysis  Creatinine 8.5  Objective:  Vital signs in last 24 hours:  Temp:  [97.5 F (36.4 C)-98.3 F (36.8 C)] 98.2 F (36.8 C) (10/24 1203) Pulse Rate:  [47-100] 68 (10/24 1203) Resp:  [17-20] 17 (10/24 1203) BP: (98-125)/(58-77) 109/59 (10/24 1203) SpO2:  [98 %-100 %] 98 % (10/24 1203)  Weight change:  Filed Weights   01/23/21 1304 01/24/21 1618 01/25/21 1015  Weight: 97.5 kg 108.1 kg 108.1 kg    Intake/Output: I/O last 3 completed shifts: In: 645.1 [P.O.:240; Other:5; IV Piggyback:400.1] Out: 1370 [Urine:800; Drains:570]   Intake/Output this shift:  Total I/O In: 2084.9 [I.V.:2029.9; Other:5; IV Piggyback:50] Out: 505 [Urine:550; Drains:120]  Physical Exam: General: NAD, laying in bed  Head: Normocephalic, atraumatic. Moist oral mucosal membranes  Eyes: Anicteric  Lungs:  Clear to auscultation, normal effort  Heart: Irregular rhythm  Abdomen:  Soft, nontender  Extremities: Trace peripheral edema.  Neurologic: Nonfocal, moving all four extremities  Skin: No lesions       Basic Metabolic Panel: Recent Labs  Lab 01/23/21 1058 01/24/21 0609 01/25/21 0535 01/26/21 0945 01/28/21 0448  NA 134* 137 139 139 140  K 4.0 3.9 4.1 4.3 3.7  CL 102 109 110 108 106  CO2 19* 18* 18* 17* 17*  GLUCOSE 121* 117* 127* 205* 117*  BUN 70* 83*  99* 109* 88*  CREATININE 7.73* 7.81* 8.37* 8.58* 8.50*  CALCIUM 8.0* 8.3* 8.2* 8.2* 7.6*  MG  --  2.0  --   --   --   PHOS  --   --  6.7*  --   --      Liver Function Tests: Recent Labs  Lab 01/23/21 1058 01/24/21 0609 01/25/21 0535  AST 682* 303* 146*  ALT 189* 162* 131*  ALKPHOS 32* 31* 28*  BILITOT 0.8 0.6 0.4  PROT 6.2* 5.6* 5.3*  ALBUMIN 2.8* 2.5* 2.2*  2.4*    Recent Labs  Lab 01/23/21 1330  LIPASE 34    No results for input(s): AMMONIA in the last 168 hours.  CBC: Recent Labs  Lab 01/23/21 1058 01/24/21 0609 01/25/21 0535 01/26/21 0945  WBC 6.7 5.3 4.1 6.4  NEUTROABS 6.0  --  3.2 5.4  HGB 9.6* 8.8* 9.1* 9.8*  HCT 30.4* 26.6* 27.9* 31.8*  MCV 90.2 88.7 86.9 90.3  PLT 125* 128* 148* 182     Cardiac Enzymes: No results for input(s): CKTOTAL, CKMB, CKMBINDEX, TROPONINI in the last 168 hours.  BNP: Invalid input(s): POCBNP  CBG: Recent Labs  Lab 01/27/21 1147 01/27/21 1612 01/27/21 2041 01/28/21 0758 01/28/21 1204  GLUCAP 145* 134* 160* 118* 119*     Microbiology: Results for orders placed or performed during the hospital encounter of 01/23/21  Blood culture (routine x 2)     Status: Abnormal   Collection Time: 01/23/21  1:12 PM   Specimen: BLOOD  Result Value Ref Range Status   Specimen Description   Final    BLOOD RIGHT ANTECUBITAL Performed at [facility], [address] [place], [place] [address]    Special Requests   Final    BOTTLES DRAWN AEROBIC AND ANAEROBIC Blood Culture results may not be optimal due to an inadequate volume of blood received in culture bottles Performed at [facility], [address] [place], [place] [address]    Culture  Setup Time   Final    GRAM NEGATIVE RODS IN BOTH AEROBIC AND ANAEROBIC BOTTLES CRITICAL RESULT CALLED TO, READ BACK BY AND VERIFIED WITH: NATHAN BLUE@0422  01/24/21 RH Performed at [facility], [address] [place], [place] [address]    Culture  KLEBSIELLA OXYTOCA (A)  Final   Report Status 01/26/2021 FINAL  Final   Organism ID, Bacteria KLEBSIELLA OXYTOCA  Final      Susceptibility   Klebsiella oxytoca - MIC*    AMPICILLIN >=32 RESISTANT Resistant     CEFAZOLIN 8 SENSITIVE Sensitive     CEFEPIME <=0.12 SENSITIVE Sensitive     CEFTAZIDIME <=1 SENSITIVE Sensitive     CEFTRIAXONE <=0.25 SENSITIVE Sensitive     CIPROFLOXACIN <=0.25 SENSITIVE Sensitive     GENTAMICIN <=1 SENSITIVE Sensitive     IMIPENEM <=0.25 SENSITIVE Sensitive     TRIMETH/SULFA <=20 SENSITIVE Sensitive     AMPICILLIN/SULBACTAM 8 SENSITIVE Sensitive     PIP/TAZO <=4 SENSITIVE Sensitive     * KLEBSIELLA OXYTOCA  Blood Culture ID Panel (Reflexed)     Status: Abnormal   Collection Time: 01/23/21  1:12 PM  Result Value Ref Range Status   Enterococcus faecalis NOT DETECTED NOT DETECTED Final   Enterococcus Faecium NOT DETECTED NOT DETECTED Final   Listeria monocytogenes NOT DETECTED NOT DETECTED Final   Staphylococcus species NOT DETECTED NOT DETECTED Final   Staphylococcus aureus (BCID) NOT DETECTED NOT DETECTED Final   Staphylococcus epidermidis NOT DETECTED NOT DETECTED Final   Staphylococcus lugdunensis NOT DETECTED NOT DETECTED Final   Streptococcus species NOT DETECTED NOT DETECTED Final   Streptococcus agalactiae NOT DETECTED NOT DETECTED Final   Streptococcus pneumoniae NOT DETECTED NOT DETECTED Final   Streptococcus pyogenes NOT DETECTED NOT DETECTED Final   A.calcoaceticus-baumannii NOT DETECTED NOT DETECTED Final   Bacteroides fragilis NOT DETECTED NOT DETECTED Final   Enterobacterales DETECTED (A) NOT DETECTED Final    Comment: Enterobacterales represent a large order of gram negative bacteria, not a single organism. CRITICAL RESULT CALLED TO, READ BACK BY AND VERIFIED WITH: NATHAN BLUE@0422  01/24/21 RH    Enterobacter cloacae complex NOT DETECTED NOT DETECTED Final   Escherichia coli NOT DETECTED NOT DETECTED Final   Klebsiella aerogenes NOT  DETECTED NOT DETECTED Final   Klebsiella oxytoca DETECTED (A) NOT DETECTED Final    Comment: CRITICAL RESULT CALLED TO, READ BACK BY AND VERIFIED WITH: NATHAN BLUE@0422  01/24/21 RH    Klebsiella pneumoniae NOT DETECTED NOT DETECTED Final   Proteus species NOT DETECTED NOT DETECTED Final   Salmonella species NOT DETECTED NOT DETECTED Final   Serratia marcescens NOT DETECTED NOT DETECTED Final   Haemophilus influenzae NOT DETECTED NOT DETECTED Final   Neisseria meningitidis NOT DETECTED NOT DETECTED Final   Pseudomonas aeruginosa NOT DETECTED NOT DETECTED Final   Stenotrophomonas maltophilia NOT DETECTED NOT DETECTED Final   Candida albicans NOT DETECTED NOT DETECTED Final   Candida auris NOT DETECTED NOT DETECTED Final   Candida glabrata NOT DETECTED NOT DETECTED Final   Candida krusei  NOT DETECTED NOT DETECTED Final   Candida parapsilosis NOT DETECTED NOT DETECTED Final   Candida tropicalis NOT DETECTED NOT DETECTED Final   Cryptococcus neoformans/gattii NOT DETECTED NOT DETECTED Final   CTX-M ESBL NOT DETECTED NOT DETECTED Final   Carbapenem resistance IMP NOT DETECTED NOT DETECTED Final   Carbapenem resistance KPC NOT DETECTED NOT DETECTED Final   Carbapenem resistance NDM NOT DETECTED NOT DETECTED Final   Carbapenem resist OXA 48 LIKE NOT DETECTED NOT DETECTED Final   Carbapenem resistance VIM NOT DETECTED NOT DETECTED Final    Comment: Performed at [facility], [address] [place], [place] [address]  Blood culture (routine x 2)     Status: Abnormal   Collection Time: 01/23/21  1:17 PM   Specimen: BLOOD  Result Value Ref Range Status   Specimen Description   Final    BLOOD BLOOD RIGHT FOREARM Performed at [facility], [address] [place] [place], [place] [address]    Special Requests   Final    BOTTLES DRAWN AEROBIC AND ANAEROBIC Blood Culture results may not be optimal due to an inadequate volume of blood received in culture bottles Performed at [facility], [address] [place], [place] [address]    Culture  Setup Time   Final    GRAM NEGATIVE RODS IN BOTH AEROBIC AND ANAEROBIC BOTTLES CRITICAL VALUE NOTED.  VALUE IS CONSISTENT WITH PREVIOUSLY REPORTED AND CALLED VALUE. Performed at [facility], [address] [place], [place] [address]    Culture (A)  Final    KLEBSIELLA OXYTOCA SUSCEPTIBILITIES PERFORMED ON PREVIOUS CULTURE WITHIN THE LAST 5 DAYS. Performed at [facility], [place] [address] [place], [place] [address]    Report Status 01/28/2021 FINAL  Final  Resp Panel by RT-PCR (Flu A&B, Covid) Nasopharyngeal Swab     Status: None   Collection Time: 01/23/21  4:49 PM   Specimen: Nasopharyngeal Swab; Nasopharyngeal(NP) swabs in vial transport medium  Result Value Ref Range Status   SARS Coronavirus 2 by RT PCR NEGATIVE NEGATIVE Final    Comment: (NOTE) SARS-CoV-2 target nucleic acids are NOT DETECTED.  The SARS-CoV-2 RNA is generally detectable in upper respiratory specimens during the acute phase of infection. The lowest concentration of SARS-CoV-2 viral copies this assay can detect is 138 copies/mL. A negative result does not preclude SARS-Cov-2 infection and should not be used as the sole basis for treatment or other patient management decisions. A negative result may occur with  improper specimen collection/handling, submission of specimen other than nasopharyngeal swab, presence of viral mutation(s) within the areas targeted by this assay, and inadequate number of viral copies(<138 copies/mL). A negative result must be combined with clinical observations, patient history, and epidemiological information. The expected result is Negative.  Fact Sheet for Patients:  EntrepreneurPulse.com.au  Fact Sheet for Healthcare Providers:  IncredibleEmployment.be  This test is no t yet approved or cleared by the [place] FDA and  has been authorized for  detection and/or diagnosis of SARS-CoV-2 by FDA under an Emergency Use Authorization (EUA). This EUA will remain  in effect (meaning this test can be used) for the duration of the COVID-19 declaration under Section 564(b)(1) of the Act, 21 U.S.C.section 360bbb-3(b)(1), unless the authorization is terminated  or revoked sooner.       Influenza A by PCR NEGATIVE NEGATIVE Final   Influenza B by PCR NEGATIVE NEGATIVE Final    Comment: (NOTE) The Xpert Xpress SARS-CoV-2/FLU/RSV plus assay is intended as  an aid in the diagnosis of influenza from Nasopharyngeal swab specimens and should not be used as a sole basis for treatment. Nasal washings and aspirates are unacceptable for Xpert Xpress SARS-CoV-2/FLU/RSV testing.  Fact Sheet for Patients: EntrepreneurPulse.com.au  Fact Sheet for Healthcare Providers: IncredibleEmployment.be  This test is not yet approved or cleared by the [place] FDA and has been authorized for detection and/or diagnosis of SARS-CoV-2 by FDA under an Emergency Use Authorization (EUA). This EUA will remain in effect (meaning this test can be used) for the duration of the COVID-19 declaration under Section 564(b)(1) of the Act, 21 U.S.C. section 360bbb-3(b)(1), unless the authorization is terminated or revoked.  Performed at [facility], [address], [place], [place] [address]   Aerobic/Anaerobic Culture w Gram Stain (surgical/deep wound)     Status: Abnormal (Preliminary result)   Collection Time: 01/25/21 11:39 AM   Specimen: BILE  Result Value Ref Range Status   Specimen Description   Final    BILE Performed at [facility], [address] [place], [place] [address]    Special Requests   Final    NONE Performed at [facility], [place], [place] [address]    Gram Stain   Final    NO SQUAMOUS EPITHELIAL CELLS SEEN FEW NO WBC SEEN FEW GRAM POSITIVE RODS MODERATE  GRAM POSITIVE COCCI    Culture (A)  Final    MULTIPLE ORGANISMS PRESENT, NONE PREDOMINANT NO STAPHYLOCOCCUS AUREUS ISOLATED NO GROUP A STREP (S.PYOGENES) ISOLATED Performed at [facility], [place] [address] [place], [place] [address]    Report Status PENDING  Incomplete    Coagulation Studies: No results for input(s): LABPROT, INR in the last 72 hours.   Urinalysis: No results for input(s): COLORURINE, LABSPEC, PHURINE, GLUCOSEU, HGBUR, BILIRUBINUR, KETONESUR, PROTEINUR, UROBILINOGEN, NITRITE, LEUKOCYTESUR in the last 72 hours.  Invalid input(s): APPERANCEUR    Imaging: No results found.   Medications:    sodium chloride 50 mL/hr at 01/28/21 0809   piperacillin-tazobactam (ZOSYN)  IV 2.25 g (01/28/21 1302)    carvedilol  3.125 mg Oral BID WC   Chlorhexidine Gluconate Cloth  6 each Topical Q0600   diltiazem  120 mg Oral Daily   heparin  5,000 Units Subcutaneous Q8H   insulin aspart  0-5 Units Subcutaneous QHS   insulin aspart  0-9 Units Subcutaneous TID WC   isosorbide mononitrate  30 mg Oral QPM   multivitamin with minerals  1 tablet Oral Daily   pantoprazole  40 mg Oral Daily   sodium bicarbonate  650 mg Oral TID   sodium chloride flush  5 mL Intracatheter Q8H   vitamin B-12  1,000 mcg Oral Daily   fentaNYL, midazolam, ondansetron **OR** ondansetron (ZOFRAN) IV, oxyCODONE  Assessment/ Plan:  [name] is a 80 y.o.  male with past medical history of B12 deficiency, hypertension, hyperlipidemia, and CKD.  Patient presents to the emergency room with complaints of weakness.  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  Acute kidney injury secondary to prerenal azotemia due to progressive diarrhea and poor appetite. Losartan held Abdominal ultrasound showed  slightly echogenic right kidney Renal function has not improved. Spoke with nephrologist over weekend and is agreeable to initiate dialysis. Vascular to place a Permcath today. After placement, we will  initiate dialysis today. Will plan for another dialysis treatment tomorrow. Dialysis coordinator aware of patient and will pursue outpatient needs.   Lab Results  Component Value Date   CREATININE 8.50 (H) 01/28/2021   CREATININE 8.58 (H) 01/26/2021   CREATININE 8.37 (H) 01/25/2021    Intake/Output Summary (Last 24 hours) at 01/28/2021 1353 Last data filed at 01/28/2021 1305 Gross per 24 hour  Intake 2729.98 ml  Output 1490 ml  Net 1239.98 ml    2. Anemia of chronic kidney disease Lab Results  Component Value Date   HGB 9.8 (L) 01/26/2021  Iron supplement twice daily outpatient Iron studies indicate anemia of chronic disease   3. Secondary Hyperparathyroidism:  Lab Results  Component Value Date   CALCIUM 7.6 (L) 01/28/2021   PHOS 6.7 (H) 01/25/2021  Calcium and phosphorus not at target  4.  Hypertension with chronic kidney disease.  Home regimen includes carvedilol, hydralazine laying, and isosorbide.  Currently receiving carvedilol, and isosorbide. BP 109/59  5.  Acute cholecystitis.  Abdominal ultrasound shows gallbladder sludge and nonmobile stone in gallbladder neck. HIDA scan consistent with cystic duct obstruction. Percutaneous cholecystostomy tube placed on 01/25/21.   LOS: [place] 10/24/20221:53 PM

## 2021-01-28 NOTE — Progress Notes (Signed)
PROGRESS NOTE    Gregory Dixon  YPP:[id number] DOB: 17-May-1940 DOA: 01/23/2021 PCP: Valera Castle, MD    Brief Narrative:  80 year old male past medical history for chronic kidney disease stage IV, B12 deficiency, hypertension and dyslipidemia who presented with weakness.  Reported 3-4 days of chills, difficulty ambulating, no chest pain, dyspnea or abdominal pain.   Abdominal ultrasound with sludge in the gallbladder, nonmobile stone at the gallbladder neck with slight increase wall thickness but negative sonographic Murphy sign.   Patient has been placed on intravenous antibiotics, surgery was consulted. Recommendations to get a HIDA scan.  HIDA scan positive.  Considering patient's medical comorbidities and high surgical risk and the recommendation was made for a percutaneous cholecystostomy.  This was placed 10/21.  Patient tolerated without issue.   Patient developed new onset atrial fibrillation with rapid ventricular response, placed on diltiazem drip.  Diltiazem drip will be weaned off and transition to p.o. Cardizem on 10/21  Patient also has deteriorating kidney function.  AKI on CKD stage IV.  Will likely need initiation of dialysis.  Initial plan was to place a peritoneal dialysis catheter intraoperatively when patient stable for cholecystectomy.  However this may be 2 months from now and patient's kidney function may deteriorate before that time.  Nephrology following and will discuss with patient regarding initiation of temporary hemodialysis.  Nephrology discussed with patient.  He is in agreement to start temporary hemodialysis.  Plan for permacath placement Monday 10/24   Assessment & Plan:   Principal Problem:   Cholelithiasis Active Problems:   Benign prostatic hyperplasia   Chronic kidney disease   Diabetic peripheral neuropathy associated with type 2 diabetes mellitus (HCC)   Hyperlipidemia   Anemia associated with chronic renal failure   Weakness    Elevated LFTs   Transaminitis   Cholecystitis  Cholecystitis Positive HIDA scan General surgery consulted, recommend percutaneous cholecystostomy Percutaneous cholecystostomy placed 10/21 Plan: Maintain drain to gravity As needed pain control Antibiotic therapy with Zosyn Can transition to Augmentin if patient will discharge home General surgery signed off 10/22   AKI on CKD stage V Non-anion gap metabolic acidosis Deteriorating kidney function Patient will likely need some form of renal replacement therapy Initially plan was for PD catheter placement during cholecystectomy Kidney function may deteriorate before that time requiring initiation of temporary hemodialysis Discussed with nephrology Plan for PermCath placement 10/24 Plan: Continue low rate NS Continue sodium bicarb oral Avoid hypotension and nephrotoxins  Initiate temporary hemodialysis in house PermCath to be placed today  New onset atrial fibrillation Noted on 10/20 Placed on diltiazem for good rate control Weaned to oral diltiazem on 10/22 Plan: Continue Cardizem CD 120 mg daily Continue low-dose Coreg Telemetry monitoring Consider addition of anticoagulation after placement of PermCath  Essential hypertension Coreg resumed Imdur resumed  GERD PPI  Chronic anemia/MGUS No indication for transfusion Continue to follow cell counts closely  DVT prophylaxis: SCD Code Status: Full Family Communication: Family member at bedside 10/22 Disposition Plan: Status is: Inpatient  Remains inpatient appropriate because: Deteriorating kidney function.  New onset atrial fibrillation.  Acute cholecystitis requiring cholecystostomy tube placement.  Plan for PermCath placement 10/24.  Will need in-house dialysis and arrangement of outpatient HD chair prior to disposition.      Level of care: Progressive Cardiac  Consultants:  General surgery Interventional radiology Nephrology  Procedures:  Percutaneous  cholecystostomy 10/21  Antimicrobials: Zosyn   Subjective: Patient seen and examined.  Sitting up in chair.  No acute distress.  N.p.o. for procedure.  Objective: Vitals:   01/27/21 2332 01/28/21 0217 01/28/21 0755 01/28/21 1203  BP: 117/77 110/62 125/66 (!) 109/59  Pulse: (!) 47 93 87 68  Resp: 20 20 17 17   Temp: 98.1 F (36.7 C) 98.3 F (36.8 C) 98.1 F (36.7 C) 98.2 F (36.8 C)  TempSrc:      SpO2: 98% 98% 100% 98%  Weight:      Height:        Intake/Output Summary (Last 24 hours) at 01/28/2021 1410 Last data filed at 01/28/2021 1401 Gross per 24 hour  Intake 3047.15 ml  Output 1490 ml  Net 1557.15 ml   Filed Weights   01/23/21 1304 01/24/21 1618 01/25/21 1015  Weight: 97.5 kg 108.1 kg 108.1 kg    Examination:  General exam: No acute distress Respiratory system: Lungs clear.  Normal work of breathing.  Room air Cardiovascular system: S1-S2, regular rate, irregular rhythm, no murmurs, no pedal edema gastrointestinal system: Soft, NT/ND, right-sided percutaneous cholecystostomy Central nervous system: Alert and oriented. No focal neurological deficits. Extremities: Symmetric 5 x 5 power. Skin: No rashes, lesions or ulcers Psychiatry: Judgement and insight appear normal. Mood & affect appropriate.     Data Reviewed: I have personally reviewed following labs and imaging studies  CBC: Recent Labs  Lab 01/23/21 1058 01/24/21 0609 01/25/21 0535 01/26/21 0945  WBC 6.7 5.3 4.1 6.4  NEUTROABS 6.0  --  3.2 5.4  HGB 9.6* 8.8* 9.1* 9.8*  HCT 30.4* 26.6* 27.9* 31.8*  MCV 90.2 88.7 86.9 90.3  PLT 125* 128* 148* 299   Basic Metabolic Panel: Recent Labs  Lab 01/23/21 1058 01/24/21 0609 01/25/21 0535 01/26/21 0945 01/28/21 0448  NA 134* 137 139 139 140  K 4.0 3.9 4.1 4.3 3.7  CL 102 109 110 108 106  CO2 19* 18* 18* 17* 17*  GLUCOSE 121* 117* 127* 205* 117*  BUN 70* 83* 99* 109* 88*  CREATININE 7.73* 7.81* 8.37* 8.58* 8.50*  CALCIUM 8.0* 8.3* 8.2*  8.2* 7.6*  MG  --  2.0  --   --   --   PHOS  --   --  6.7*  --   --    GFR: Estimated Creatinine Clearance: 9.1 mL/min (A) (by C-G formula based on SCr of 8.5 mg/dL (H)). Liver Function Tests: Recent Labs  Lab 01/23/21 1058 01/24/21 0609 01/25/21 0535  AST 682* 303* 146*  ALT 189* 162* 131*  ALKPHOS 32* 31* 28*  BILITOT 0.8 0.6 0.4  PROT 6.2* 5.6* 5.3*  ALBUMIN 2.8* 2.5* 2.2*  2.4*   Recent Labs  Lab 01/23/21 1330  LIPASE 34   No results for input(s): AMMONIA in the last 168 hours. Coagulation Profile: Recent Labs  Lab 01/25/21 0535  INR 1.3*   Cardiac Enzymes: No results for input(s): CKTOTAL, CKMB, CKMBINDEX, TROPONINI in the last 168 hours. BNP (last 3 results) No results for input(s): PROBNP in the last 8760 hours. HbA1C: No results for input(s): HGBA1C in the last 72 hours.  CBG: Recent Labs  Lab 01/27/21 1147 01/27/21 1612 01/27/21 2041 01/28/21 0758 01/28/21 1204  GLUCAP 145* 134* 160* 118* 119*   Lipid Profile: No results for input(s): CHOL, HDL, LDLCALC, TRIG, CHOLHDL, LDLDIRECT in the last 72 hours. Thyroid Function Tests: No results for input(s): TSH, T4TOTAL, FREET4, T3FREE, THYROIDAB in the last 72 hours.  Anemia Panel: No results for input(s): VITAMINB12, FOLATE, FERRITIN, TIBC, IRON, RETICCTPCT in the last 72 hours.  Sepsis Labs: Recent Labs  Lab 01/23/21 1312 01/23/21 1751  LATICACIDVEN 2.7* 1.1    Recent Results (from the past 240 hour(s))  Blood culture (routine x 2)     Status: Abnormal   Collection Time: 01/23/21  1:12 PM   Specimen: BLOOD  Result Value Ref Range Status   Specimen Description   Final    BLOOD RIGHT ANTECUBITAL Performed at [facility], [address] [place], [place] [address]    Special Requests   Final    BOTTLES DRAWN AEROBIC AND ANAEROBIC Blood Culture results may not be optimal due to an inadequate volume of blood received in culture bottles Performed at [facility], [address], [place], [place] [address]    Culture  Setup Time   Final    GRAM NEGATIVE RODS IN BOTH AEROBIC AND ANAEROBIC BOTTLES CRITICAL RESULT CALLED TO, READ BACK BY AND VERIFIED WITH: NATHAN BLUE@0422  01/24/21 RH Performed at [facility], [address] [place], [place] [address]    Culture KLEBSIELLA OXYTOCA (A)  Final   Report Status 01/26/2021 FINAL  Final   Organism ID, Bacteria KLEBSIELLA OXYTOCA  Final      Susceptibility   Klebsiella oxytoca - MIC*    AMPICILLIN >=32 RESISTANT Resistant     CEFAZOLIN 8 SENSITIVE Sensitive     CEFEPIME <=0.12 SENSITIVE Sensitive     CEFTAZIDIME <=1 SENSITIVE Sensitive     CEFTRIAXONE <=0.25 SENSITIVE Sensitive     CIPROFLOXACIN <=0.25 SENSITIVE Sensitive     GENTAMICIN <=1 SENSITIVE Sensitive     IMIPENEM <=0.25 SENSITIVE Sensitive     TRIMETH/SULFA <=20 SENSITIVE Sensitive     AMPICILLIN/SULBACTAM 8 SENSITIVE Sensitive     PIP/TAZO <=4 SENSITIVE Sensitive     * KLEBSIELLA OXYTOCA  Blood Culture ID Panel (Reflexed)     Status: Abnormal   Collection Time: 01/23/21  1:12 PM  Result Value Ref Range Status   Enterococcus faecalis NOT DETECTED NOT DETECTED Final   Enterococcus Faecium NOT DETECTED NOT DETECTED Final   Listeria monocytogenes NOT DETECTED NOT DETECTED Final   Staphylococcus species NOT DETECTED NOT DETECTED Final   Staphylococcus aureus (BCID) NOT DETECTED NOT DETECTED Final   Staphylococcus epidermidis NOT DETECTED NOT DETECTED Final   Staphylococcus lugdunensis NOT DETECTED NOT DETECTED Final   Streptococcus species NOT DETECTED NOT DETECTED Final   Streptococcus agalactiae NOT DETECTED NOT DETECTED Final   Streptococcus pneumoniae NOT DETECTED NOT DETECTED Final   Streptococcus pyogenes NOT DETECTED NOT DETECTED Final   A.calcoaceticus-baumannii NOT DETECTED NOT DETECTED Final   Bacteroides fragilis NOT DETECTED NOT DETECTED Final   Enterobacterales DETECTED (A) NOT DETECTED Final    Comment:  Enterobacterales represent a large order of gram negative bacteria, not a single organism. CRITICAL RESULT CALLED TO, READ BACK BY AND VERIFIED WITH: NATHAN BLUE@0422  01/24/21 RH    Enterobacter cloacae complex NOT DETECTED NOT DETECTED Final   Escherichia coli NOT DETECTED NOT DETECTED Final   Klebsiella aerogenes NOT DETECTED NOT DETECTED Final   Klebsiella oxytoca DETECTED (A) NOT DETECTED Final    Comment: CRITICAL RESULT CALLED TO, READ BACK BY AND VERIFIED WITH: NATHAN BLUE@0422  01/24/21 RH    Klebsiella pneumoniae NOT DETECTED NOT DETECTED Final   Proteus species NOT DETECTED NOT DETECTED Final   Salmonella species NOT DETECTED NOT DETECTED Final   Serratia marcescens NOT DETECTED NOT DETECTED Final   Haemophilus influenzae NOT DETECTED NOT DETECTED Final   Neisseria meningitidis NOT DETECTED NOT DETECTED Final   Pseudomonas aeruginosa NOT  DETECTED NOT DETECTED Final   Stenotrophomonas maltophilia NOT DETECTED NOT DETECTED Final   Candida albicans NOT DETECTED NOT DETECTED Final   Candida auris NOT DETECTED NOT DETECTED Final   Candida glabrata NOT DETECTED NOT DETECTED Final   Candida krusei NOT DETECTED NOT DETECTED Final   Candida parapsilosis NOT DETECTED NOT DETECTED Final   Candida tropicalis NOT DETECTED NOT DETECTED Final   Cryptococcus neoformans/gattii NOT DETECTED NOT DETECTED Final   CTX-M ESBL NOT DETECTED NOT DETECTED Final   Carbapenem resistance IMP NOT DETECTED NOT DETECTED Final   Carbapenem resistance KPC NOT DETECTED NOT DETECTED Final   Carbapenem resistance NDM NOT DETECTED NOT DETECTED Final   Carbapenem resist OXA 48 LIKE NOT DETECTED NOT DETECTED Final   Carbapenem resistance VIM NOT DETECTED NOT DETECTED Final    Comment: Performed at [facility], [address] [place], [place] [address]  Blood culture (routine x 2)     Status: Abnormal   Collection Time: 01/23/21  1:17 PM   Specimen: BLOOD  Result Value Ref Range Status   Specimen  Description   Final    BLOOD BLOOD RIGHT FOREARM Performed at [facility], [address] [place], [place] [address]    Special Requests   Final    BOTTLES DRAWN AEROBIC AND ANAEROBIC Blood Culture results may not be optimal due to an inadequate volume of blood received in culture bottles Performed at [facility], [address] [place], [place] [address]    Culture  Setup Time   Final    GRAM NEGATIVE RODS IN BOTH AEROBIC AND ANAEROBIC BOTTLES CRITICAL VALUE NOTED.  VALUE IS CONSISTENT WITH PREVIOUSLY REPORTED AND CALLED VALUE. Performed at [facility], [address] [place], [place] [address]    Culture (A)  Final    KLEBSIELLA OXYTOCA SUSCEPTIBILITIES PERFORMED ON PREVIOUS CULTURE WITHIN THE LAST 5 DAYS. Performed at [facility], [place] [address], [place],  [address]    Report Status 01/28/2021 FINAL  Final  Resp Panel by RT-PCR (Flu A&B, Covid) Nasopharyngeal Swab     Status: None   Collection Time: 01/23/21  4:49 PM   Specimen: Nasopharyngeal Swab; Nasopharyngeal(NP) swabs in vial transport medium  Result Value Ref Range Status   SARS Coronavirus 2 by RT PCR NEGATIVE NEGATIVE Final    Comment: (NOTE) SARS-CoV-2 target nucleic acids are NOT DETECTED.  The SARS-CoV-2 RNA is generally detectable in upper respiratory specimens during the acute phase of infection. The lowest concentration of SARS-CoV-2 viral copies this assay can detect is 138 copies/mL. A negative result does not preclude SARS-Cov-2 infection and should not be used as the sole basis for treatment or other patient management decisions. A negative result may occur with  improper specimen collection/handling, submission of specimen other than nasopharyngeal swab, presence of viral mutation(s) within the areas targeted by this assay, and inadequate number of viral copies(<138 copies/mL). A negative result must be combined with clinical observations, patient history,  and epidemiological information. The expected result is Negative.  Fact Sheet for Patients:  EntrepreneurPulse.com.au  Fact Sheet for Healthcare Providers:  IncredibleEmployment.be  This test is no t yet approved or cleared by the [place] FDA and  has been authorized for detection and/or diagnosis of SARS-CoV-2 by FDA under an Emergency Use Authorization (EUA). This EUA will remain  in effect (meaning this test can be used) for the duration of the COVID-19 declaration under Section 564(b)(1) of the Act, 21 U.S.C.section 360bbb-3(b)(1), unless  the authorization is terminated  or revoked sooner.       Influenza A by PCR NEGATIVE NEGATIVE Final   Influenza B by PCR NEGATIVE NEGATIVE Final    Comment: (NOTE) The Xpert Xpress SARS-CoV-2/FLU/RSV plus assay is intended as an aid in the diagnosis of influenza from Nasopharyngeal swab specimens and should not be used as a sole basis for treatment. Nasal washings and aspirates are unacceptable for Xpert Xpress SARS-CoV-2/FLU/RSV testing.  Fact Sheet for Patients: EntrepreneurPulse.com.au  Fact Sheet for Healthcare Providers: IncredibleEmployment.be  This test is not yet approved or cleared by the [place] FDA and has been authorized for detection and/or diagnosis of SARS-CoV-2 by FDA under an Emergency Use Authorization (EUA). This EUA will remain in effect (meaning this test can be used) for the duration of the COVID-19 declaration under Section 564(b)(1) of the Act, 21 U.S.C. section 360bbb-3(b)(1), unless the authorization is terminated or revoked.  Performed at [facility], [address] [place], [place] [address]   Aerobic/Anaerobic Culture w Gram Stain (surgical/deep wound)     Status: Abnormal (Preliminary result)   Collection Time: 01/25/21 11:39 AM   Specimen: BILE  Result Value Ref Range Status   Specimen Description   Final     BILE Performed at [facility], [address] [place], [place] [address]    Special Requests   Final    NONE Performed at [facility], [place], [place] [address]    Gram Stain   Final    NO SQUAMOUS EPITHELIAL CELLS SEEN FEW NO WBC SEEN FEW GRAM POSITIVE RODS MODERATE GRAM POSITIVE COCCI    Culture (A)  Final    MULTIPLE ORGANISMS PRESENT, NONE PREDOMINANT NO STAPHYLOCOCCUS AUREUS ISOLATED NO GROUP A STREP (S.PYOGENES) ISOLATED Performed at [facility], [place] [address] [place], [place] [address]    Report Status PENDING  Incomplete         Radiology Studies: No results found.      Scheduled Meds:  carvedilol  3.125 mg Oral BID WC   Chlorhexidine Gluconate Cloth  6 each Topical Q0600   diltiazem  120 mg Oral Daily   heparin  5,000 Units Subcutaneous Q8H   insulin aspart  0-5 Units Subcutaneous QHS   insulin aspart  0-9 Units Subcutaneous TID WC   isosorbide mononitrate  30 mg Oral QPM   multivitamin with minerals  1 tablet Oral Daily   pantoprazole  40 mg Oral Daily   sodium bicarbonate  650 mg Oral TID   sodium chloride flush  5 mL Intracatheter Q8H   vitamin B-12  1,000 mcg Oral Daily   Continuous Infusions:  sodium chloride 50 mL/hr at 01/28/21 1404   piperacillin-tazobactam (ZOSYN)  IV Stopped (01/28/21 1332)     LOS: 4 days    Time spent: 25 minutes    Sidney Ace, MD [facility]   If 7PM-7AM, please contact night-coverage  01/28/2021, 2:10 PM

## 2021-01-28 NOTE — Interval H&P Note (Signed)
History and Physical Interval Note:  01/28/2021 3:20 PM  Gregory Dixon  has presented today for surgery, with the diagnosis of ESRD.  The various methods of treatment have been discussed with the patient and family. After consideration of risks, benefits and other options for treatment, the patient has consented to  Procedure(s): DIALYSIS/PERMA CATHETER INSERTION (Bilateral) as a surgical intervention.  The patient's history has been reviewed, patient examined, no change in status, stable for surgery.  I have reviewed the patient's chart and labs.  Questions were answered to the patient's satisfaction.     Leotis Pain

## 2021-01-29 ENCOUNTER — Encounter: Payer: Self-pay | Admitting: Vascular Surgery

## 2021-01-29 DIAGNOSIS — K8010 Calculus of gallbladder with chronic cholecystitis without obstruction: Secondary | ICD-10-CM | POA: Diagnosis not present

## 2021-01-29 LAB — BASIC METABOLIC PANEL
Anion gap: 11 (ref 5–15)
BUN: 79 mg/dL — ABNORMAL HIGH (ref 8–23)
CO2: 21 mmol/L — ABNORMAL LOW (ref 22–32)
Calcium: 8.1 mg/dL — ABNORMAL LOW (ref 8.9–10.3)
Chloride: 108 mmol/L (ref 98–111)
Creatinine, Ser: 6.44 mg/dL — ABNORMAL HIGH (ref 0.61–1.24)
GFR, Estimated: 8 mL/min — ABNORMAL LOW (ref >60)
Glucose, Bld: 109 mg/dL — ABNORMAL HIGH (ref 70–99)
Potassium: 3.6 mmol/L (ref 3.5–5.1)
Sodium: 140 mmol/L (ref 135–145)

## 2021-01-29 LAB — HEPATITIS B CORE ANTIBODY, TOTAL: Hep B Core Total Ab: NONREACTIVE

## 2021-01-29 LAB — GLUCOSE, CAPILLARY
Glucose-Capillary: 114 mg/dL — ABNORMAL HIGH (ref 70–99)
Glucose-Capillary: 100 mg/dL — ABNORMAL HIGH (ref 70–99)
Glucose-Capillary: 181 mg/dL — ABNORMAL HIGH (ref 70–99)
Glucose-Capillary: 146 mg/dL — ABNORMAL HIGH (ref 70–99)

## 2021-01-29 MED ORDER — METOPROLOL TARTRATE 5 MG/5ML IV SOLN
5.0000 mg | INTRAVENOUS | Status: DC | PRN
  Administered 2021-01-29 – 2021-01-30 (×3): 5 mg via INTRAVENOUS
  Filled 2021-01-29 (×4): qty 5

## 2021-01-29 MED ORDER — CARVEDILOL 6.25 MG PO TABS
6.2500 mg | ORAL_TABLET | Freq: Two times a day (BID) | ORAL | Status: DC
  Administered 2021-01-29 – 2021-01-31 (×4): 6.25 mg via ORAL
  Filled 2021-01-29: qty 1
  Filled 2021-01-29: qty 2
  Filled 2021-01-29 (×2): qty 1

## 2021-01-29 NOTE — Progress Notes (Signed)
   01/28/21 1730 01/28/21 1745 01/28/21 1800  Vitals  BP (!) 147/85 (!) 149/84 (!) 151/103  MAP (mmHg) 98 100 114  Pulse Rate (!) 55 81 90  ECG Heart Rate 93 97 (!) 101  Resp 16 17 20   During Hemodialysis Assessment  Blood Flow Rate (mL/min) 200 mL/min 200 mL/min 200 mL/min  Arterial Pressure (mmHg) -60 mmHg -60 mmHg -120 mmHg  Venous Pressure (mmHg) 70 mmHg 70 mmHg 110 mmHg  Transmembrane Pressure (mmHg) 60 mmHg 60 mmHg 70 mmHg  Ultrafiltration Rate (mL/min) 250 mL/min 250 mL/min 250 mL/min  Dialysate Flow Rate (mL/min) 300 ml/min 300 ml/min 300 ml/min  Conductivity: Machine  13.9 13.9 13.9  HD Safety Checks Performed Yes Yes Yes  Intra-Hemodialysis Comments Tx initiated (treatment start 1735) Progressing as prescribed Progressing as prescribed    01/28/21 1815 01/28/21 1830 01/28/21 1845  Vitals  BP  --  124/83 (!) 145/96  MAP (mmHg)  --  94 113  Pulse Rate (!) 46 (!) 43 (!) 139  ECG Heart Rate (!) 107 (!) 121 (!) 117  Resp 19 15 17   During Hemodialysis Assessment  Blood Flow Rate (mL/min) 200 mL/min 200 mL/min 20 mL/min  Arterial Pressure (mmHg) -120 mmHg -120 mmHg -120 mmHg  Venous Pressure (mmHg) 110 mmHg 110 mmHg 110 mmHg  Transmembrane Pressure (mmHg) 70 mmHg 70 mmHg 70 mmHg  Ultrafiltration Rate (mL/min) 250 mL/min 250 mL/min 250 mL/min  Dialysate Flow Rate (mL/min) 300 ml/min 300 ml/min 300 ml/min  Conductivity: Machine  13.9 13.9 13.9  HD Safety Checks Performed Yes Yes Yes  Intra-Hemodialysis Comments Progressing as prescribed Progressing as prescribed Progressing as prescribed

## 2021-01-29 NOTE — Progress Notes (Signed)
   01/28/21 1900 01/28/21 1915 01/28/21 1930  Vitals  BP (!) 143/86 (!) 149/76 (!) 149/99  MAP (mmHg) 99 99 116  Pulse Rate (!) 112 (!) 113 84  ECG Heart Rate (!) 114 (!) 128 (!) 117  Resp (!) 28 18 (!) 21  During Hemodialysis Assessment  Blood Flow Rate (mL/min) 200 mL/min 200 mL/min 200 mL/min  Arterial Pressure (mmHg) -120 mmHg -120 mmHg -130 mmHg  Venous Pressure (mmHg) 110 mmHg 110 mmHg 110 mmHg  Transmembrane Pressure (mmHg) 70 mmHg 70 mmHg 70 mmHg  Ultrafiltration Rate (mL/min) 250 mL/min 250 mL/min 250 mL/min  Dialysate Flow Rate (mL/min) 300 ml/min 300 ml/min 300 ml/min  Conductivity: Machine  13.9 13.9 13.9  HD Safety Checks Performed Yes Yes Yes  Dialysis Fluid Bolus  --  Normal Saline  --   Bolus Amount (mL)  --  100 mL  --   Intra-Hemodialysis Comments Progressing as prescribed Progressing as prescribed (Lines darkening bolus NS 100cc) Progressing as prescribed    01/28/21 1945  Vitals  BP (!) 142/73  MAP (mmHg) 92  Pulse Rate (!) 44  ECG Heart Rate (!) 109  Resp 20  During Hemodialysis Assessment  Blood Flow Rate (mL/min) 200 mL/min  Arterial Pressure (mmHg) -130 mmHg  Venous Pressure (mmHg) 110 mmHg  Transmembrane Pressure (mmHg) 70 mmHg  Ultrafiltration Rate (mL/min) 250 mL/min  Dialysate Flow Rate (mL/min) 300 ml/min  Conductivity: Machine  13.9  HD Safety Checks Performed Yes  Dialysis Fluid Bolus  --   Bolus Amount (mL)  --   Intra-Hemodialysis Comments Tx completed

## 2021-01-29 NOTE — Progress Notes (Addendum)
PROGRESS NOTE    Gregory Dixon  PYP:[id number] DOB: Dec 26, 1940 DOA: 01/23/2021 PCP: Valera Castle, MD    Brief Narrative:  80 year old male past medical history for chronic kidney disease stage IV, B12 deficiency, hypertension and dyslipidemia who presented with weakness.  Reported 3-4 days of chills, difficulty ambulating, no chest pain, dyspnea or abdominal pain.   Abdominal ultrasound with sludge in the gallbladder, nonmobile stone at the gallbladder neck with slight increase wall thickness but negative sonographic Murphy sign.   Patient has been placed on intravenous antibiotics, surgery was consulted. Recommendations to get a HIDA scan.  HIDA scan positive.  Considering patient's medical comorbidities and high surgical risk and the recommendation was made for a percutaneous cholecystostomy.  This was placed 10/21.  Patient tolerated without issue.   Patient developed new onset atrial fibrillation with rapid ventricular response, placed on diltiazem drip.  Diltiazem drip will be weaned off and transition to p.o. Cardizem on 10/21  Patient also has deteriorating kidney function.  AKI on CKD stage IV.  Will likely need initiation of dialysis.  Initial plan was to place a peritoneal dialysis catheter intraoperatively when patient stable for cholecystectomy.  However this may be 2 months from now and patient's kidney function may deteriorate before that time.  Nephrology following and will discuss with patient regarding initiation of temporary hemodialysis.  Nephrology discussed with patient.  He is in agreement to start temporary hemodialysis.  PermCath placed Monday 10/24.  Successful HD Monday 10/24.  Next HD planned 10/25.   Assessment & Plan:   Principal Problem:   Cholelithiasis Active Problems:   Benign prostatic hyperplasia   Chronic kidney disease   Diabetic peripheral neuropathy associated with type 2 diabetes mellitus (HCC)   Hyperlipidemia   Anemia associated with  chronic renal failure   Weakness   Elevated LFTs   Transaminitis   Cholecystitis  Cholecystitis Positive HIDA scan General surgery consulted, recommend percutaneous cholecystostomy Percutaneous cholecystostomy placed 10/21 Plan: Maintain drain to gravity As needed pain control Antibiotic therapy with Zosyn Can transition to Augmentin if patient will discharge home General surgery signed off 10/22  AKI on CKD stage V Non-anion gap metabolic acidosis Deteriorating kidney function Patient will likely need some form of renal replacement therapy Initially plan was for PD catheter placement during cholecystectomy Kidney function may deteriorate before that time requiring initiation of temporary hemodialysis Discussed with nephrology PermCath placed 10/24 HD 10/24 Plan: Discontinue maintenance fluids Continue sodium bicarb oral Avoid hypotension and nephrotoxins HD today 10/25  New onset atrial fibrillation Noted on 10/20 Initially good rate control Has been intermittently rapid rate since initiation of HD  Weaned to oral diltiazem on 10/22 Plan: Continue Cardizem CD 120 mg daily increase Coreg 6.25 twice daily As needed metoprolol push for sustained tachycardia Telemetry monitoring Patient will need Eliquis 5 BID on discharge.  Attempting to complete inpatient HD plans prior to starting therapeutic anticoagulation Outpatient referral to cardiology versus inpatient consult  Essential hypertension Coreg resumed Imdur resumed  GERD PPI  Chronic anemia/MGUS No indication for transfusion Continue to follow cell counts closely  DVT prophylaxis: SCD Code Status: Full Family Communication: Family member at bedside 10/22 Disposition Plan: Status is: Inpatient  Remains inpatient appropriate because: Deteriorating kidney function.  New onset atrial fibrillation.  Acute cholecystitis requiring cholecystostomy tube placement.  PermCath placed 10/25.  Will need in-house  dialysis until least tomorrow and arrangement of outpatient HD chair prior to disposition.      Level of care: Progressive Cardiac  Consultants:  General surgery Interventional radiology Nephrology  Procedures:  Percutaneous cholecystostomy 10/21 PermCath placement 10/24  Antimicrobials: Zosyn   Subjective: Patient seen and examined.  HD today.  No acute distress.  Objective: Vitals:   01/29/21 1200 01/29/21 1215 01/29/21 1230 01/29/21 1257  BP: (!) 143/77 (!) 126/91 (!) 148/52 (!) 147/94  Pulse: 96 88  88  Resp: 11 17  18   Temp:    98.3 F (36.8 C)  TempSrc:      SpO2:    99%  Weight:   102.4 kg   Height:        Intake/Output Summary (Last 24 hours) at 01/29/2021 1408 Last data filed at 01/29/2021 1230 Gross per 24 hour  Intake 813.01 ml  Output 1866 ml  Net -1052.99 ml   Filed Weights   01/25/21 1015 01/29/21 0920 01/29/21 1230  Weight: 108.1 kg 106.2 kg 102.4 kg    Examination:  General exam: No acute distress Respiratory system: Lungs clear.  Normal work of breathing.  Room air Cardiovascular system: S1-S2, regular rate, irregular rhythm, no murmurs, no pedal edema gastrointestinal system: Soft, NT/ND, right-sided percutaneous cholecystostomy Central nervous system: Alert and oriented. No focal neurological deficits. Extremities: Symmetric 5 x 5 power. Skin: No rashes, lesions or ulcers Psychiatry: Judgement and insight appear normal. Mood & affect appropriate.     Data Reviewed: I have personally reviewed following labs and imaging studies  CBC: Recent Labs  Lab 01/23/21 1058 01/24/21 0609 01/25/21 0535 01/26/21 0945  WBC 6.7 5.3 4.1 6.4  NEUTROABS 6.0  --  3.2 5.4  HGB 9.6* 8.8* 9.1* 9.8*  HCT 30.4* 26.6* 27.9* 31.8*  MCV 90.2 88.7 86.9 90.3  PLT 125* 128* 148* 161   Basic Metabolic Panel: Recent Labs  Lab 01/24/21 0609 01/25/21 0535 01/26/21 0945 01/28/21 0448 01/29/21 0603  NA 137 139 139 140 140  K 3.9 4.1 4.3 3.7 3.6   CL 109 110 108 106 108  CO2 18* 18* 17* 17* 21*  GLUCOSE 117* 127* 205* 117* 109*  BUN 83* 99* 109* 88* 79*  CREATININE 7.81* 8.37* 8.58* 8.50* 6.44*  CALCIUM 8.3* 8.2* 8.2* 7.6* 8.1*  MG 2.0  --   --   --   --   PHOS  --  6.7*  --   --   --    GFR: Estimated Creatinine Clearance: 12 mL/min (A) (by C-G formula based on SCr of 6.44 mg/dL (H)). Liver Function Tests: Recent Labs  Lab 01/23/21 1058 01/24/21 0609 01/25/21 0535  AST 682* 303* 146*  ALT 189* 162* 131*  ALKPHOS 32* 31* 28*  BILITOT 0.8 0.6 0.4  PROT 6.2* 5.6* 5.3*  ALBUMIN 2.8* 2.5* 2.2*  2.4*   Recent Labs  Lab 01/23/21 1330  LIPASE 34   No results for input(s): AMMONIA in the last 168 hours. Coagulation Profile: Recent Labs  Lab 01/25/21 0535  INR 1.3*   Cardiac Enzymes: No results for input(s): CKTOTAL, CKMB, CKMBINDEX, TROPONINI in the last 168 hours. BNP (last 3 results) No results for input(s): PROBNP in the last 8760 hours. HbA1C: No results for input(s): HGBA1C in the last 72 hours.  CBG: Recent Labs  Lab 01/28/21 0758 01/28/21 1204 01/28/21 2138 01/29/21 0724 01/29/21 1255  GLUCAP 118* 119* 112* 114* 100*   Lipid Profile: No results for input(s): CHOL, HDL, LDLCALC, TRIG, CHOLHDL, LDLDIRECT in the last 72 hours. Thyroid Function Tests: No results for input(s): TSH, T4TOTAL, FREET4, T3FREE, THYROIDAB in the last 72 hours.  Anemia Panel: No results for input(s): VITAMINB12, FOLATE, FERRITIN, TIBC, IRON, RETICCTPCT in the last 72 hours.  Sepsis Labs: Recent Labs  Lab 01/23/21 1312 01/23/21 1751  LATICACIDVEN 2.7* 1.1    Recent Results (from the past 240 hour(s))  Blood culture (routine x 2)     Status: Abnormal   Collection Time: 01/23/21  1:12 PM   Specimen: BLOOD  Result Value Ref Range Status   Specimen Description   Final    BLOOD RIGHT ANTECUBITAL Performed at [facility], [address] [place], [place] [address]    Special Requests   Final    BOTTLES  DRAWN AEROBIC AND ANAEROBIC Blood Culture results may not be optimal due to an inadequate volume of blood received in culture bottles Performed at [facility], [address], [place], [place] [address]    Culture  Setup Time   Final    GRAM NEGATIVE RODS IN BOTH AEROBIC AND ANAEROBIC BOTTLES CRITICAL RESULT CALLED TO, READ BACK BY AND VERIFIED WITH: NATHAN BLUE@0422  01/24/21 RH Performed at [facility], [address] [place], [place] [address]    Culture KLEBSIELLA OXYTOCA (A)  Final   Report Status 01/26/2021 FINAL  Final   Organism ID, Bacteria KLEBSIELLA OXYTOCA  Final      Susceptibility   Klebsiella oxytoca - MIC*    AMPICILLIN >=32 RESISTANT Resistant     CEFAZOLIN 8 SENSITIVE Sensitive     CEFEPIME <=0.12 SENSITIVE Sensitive     CEFTAZIDIME <=1 SENSITIVE Sensitive     CEFTRIAXONE <=0.25 SENSITIVE Sensitive     CIPROFLOXACIN <=0.25 SENSITIVE Sensitive     GENTAMICIN <=1 SENSITIVE Sensitive     IMIPENEM <=0.25 SENSITIVE Sensitive     TRIMETH/SULFA <=20 SENSITIVE Sensitive     AMPICILLIN/SULBACTAM 8 SENSITIVE Sensitive     PIP/TAZO <=4 SENSITIVE Sensitive     * KLEBSIELLA OXYTOCA  Blood Culture ID Panel (Reflexed)     Status: Abnormal   Collection Time: 01/23/21  1:12 PM  Result Value Ref Range Status   Enterococcus faecalis NOT DETECTED NOT DETECTED Final   Enterococcus Faecium NOT DETECTED NOT DETECTED Final   Listeria monocytogenes NOT DETECTED NOT DETECTED Final   Staphylococcus species NOT DETECTED NOT DETECTED Final   Staphylococcus aureus (BCID) NOT DETECTED NOT DETECTED Final   Staphylococcus epidermidis NOT DETECTED NOT DETECTED Final   Staphylococcus lugdunensis NOT DETECTED NOT DETECTED Final   Streptococcus species NOT DETECTED NOT DETECTED Final   Streptococcus agalactiae NOT DETECTED NOT DETECTED Final   Streptococcus pneumoniae NOT DETECTED NOT DETECTED Final   Streptococcus pyogenes NOT DETECTED NOT DETECTED Final    A.calcoaceticus-baumannii NOT DETECTED NOT DETECTED Final   Bacteroides fragilis NOT DETECTED NOT DETECTED Final   Enterobacterales DETECTED (A) NOT DETECTED Final    Comment: Enterobacterales represent a large order of gram negative bacteria, not a single organism. CRITICAL RESULT CALLED TO, READ BACK BY AND VERIFIED WITH: NATHAN BLUE@0422  01/24/21 RH    Enterobacter cloacae complex NOT DETECTED NOT DETECTED Final   Escherichia coli NOT DETECTED NOT DETECTED Final   Klebsiella aerogenes NOT DETECTED NOT DETECTED Final   Klebsiella oxytoca DETECTED (A) NOT DETECTED Final    Comment: CRITICAL RESULT CALLED TO, READ BACK BY AND VERIFIED WITH: NATHAN BLUE@0422  01/24/21 RH    Klebsiella pneumoniae NOT DETECTED NOT DETECTED Final   Proteus species NOT DETECTED NOT DETECTED Final   Salmonella species NOT DETECTED NOT DETECTED Final   Serratia marcescens NOT DETECTED NOT DETECTED Final  Haemophilus influenzae NOT DETECTED NOT DETECTED Final   Neisseria meningitidis NOT DETECTED NOT DETECTED Final   Pseudomonas aeruginosa NOT DETECTED NOT DETECTED Final   Stenotrophomonas maltophilia NOT DETECTED NOT DETECTED Final   Candida albicans NOT DETECTED NOT DETECTED Final   Candida auris NOT DETECTED NOT DETECTED Final   Candida glabrata NOT DETECTED NOT DETECTED Final   Candida krusei NOT DETECTED NOT DETECTED Final   Candida parapsilosis NOT DETECTED NOT DETECTED Final   Candida tropicalis NOT DETECTED NOT DETECTED Final   Cryptococcus neoformans/gattii NOT DETECTED NOT DETECTED Final   CTX-M ESBL NOT DETECTED NOT DETECTED Final   Carbapenem resistance IMP NOT DETECTED NOT DETECTED Final   Carbapenem resistance KPC NOT DETECTED NOT DETECTED Final   Carbapenem resistance NDM NOT DETECTED NOT DETECTED Final   Carbapenem resist OXA 48 LIKE NOT DETECTED NOT DETECTED Final   Carbapenem resistance VIM NOT DETECTED NOT DETECTED Final    Comment: Performed at [facility], [address] [place], [place] [address]  Blood culture (routine x 2)     Status: Abnormal   Collection Time: 01/23/21  1:17 PM   Specimen: BLOOD  Result Value Ref Range Status   Specimen Description   Final    BLOOD BLOOD RIGHT FOREARM Performed at [facility], [address] [place], [place] [address]    Special Requests   Final    BOTTLES DRAWN AEROBIC AND ANAEROBIC Blood Culture results may not be optimal due to an inadequate volume of blood received in culture bottles Performed at [facility], [address] [place], [place] [address]    Culture  Setup Time   Final    GRAM NEGATIVE RODS IN BOTH AEROBIC AND ANAEROBIC BOTTLES CRITICAL VALUE NOTED.  VALUE IS CONSISTENT WITH PREVIOUSLY REPORTED AND CALLED VALUE. Performed at [facility], [address] [place], [place] [address]    Culture (A)  Final    KLEBSIELLA OXYTOCA SUSCEPTIBILITIES PERFORMED ON PREVIOUS CULTURE WITHIN THE LAST 5 DAYS. Performed at [facility], [place] [address], [place], [place] [address]    Report Status 01/28/2021 FINAL  Final  Resp Panel by RT-PCR (Flu A&B, Covid) Nasopharyngeal Swab     Status: None   Collection Time: 01/23/21  4:49 PM   Specimen: Nasopharyngeal Swab; Nasopharyngeal(NP) swabs in vial transport medium  Result Value Ref Range Status   SARS Coronavirus 2 by RT PCR NEGATIVE NEGATIVE Final    Comment: (NOTE) SARS-CoV-2 target nucleic acids are NOT DETECTED.  The SARS-CoV-2 RNA is generally detectable in upper respiratory specimens during the acute phase of infection. The lowest concentration of SARS-CoV-2 viral copies this assay can detect is 138 copies/mL. A negative result does not preclude SARS-Cov-2 infection and should not be used as the sole basis for treatment or other patient management decisions. A negative result may occur with  improper specimen collection/handling, submission of specimen other than nasopharyngeal swab, presence of viral mutation(s)  within the areas targeted by this assay, and inadequate number of viral copies(<138 copies/mL). A negative result must be combined with clinical observations, patient history, and epidemiological information. The expected result is Negative.  Fact Sheet for Patients:  EntrepreneurPulse.com.au  Fact Sheet for Healthcare Providers:  IncredibleEmployment.be  This test is no t yet approved or cleared by the [place] FDA and  has been authorized for detection and/or diagnosis of SARS-CoV-2 by FDA under an Emergency Use Authorization (EUA). This EUA will remain  in effect (meaning this  test can be used) for the duration of the COVID-19 declaration under Section 564(b)(1) of the Act, 21 U.S.C.section 360bbb-3(b)(1), unless the authorization is terminated  or revoked sooner.       Influenza A by PCR NEGATIVE NEGATIVE Final   Influenza B by PCR NEGATIVE NEGATIVE Final    Comment: (NOTE) The Xpert Xpress SARS-CoV-2/FLU/RSV plus assay is intended as an aid in the diagnosis of influenza from Nasopharyngeal swab specimens and should not be used as a sole basis for treatment. Nasal washings and aspirates are unacceptable for Xpert Xpress SARS-CoV-2/FLU/RSV testing.  Fact Sheet for Patients: EntrepreneurPulse.com.au  Fact Sheet for Healthcare Providers: IncredibleEmployment.be  This test is not yet approved or cleared by the [place] FDA and has been authorized for detection and/or diagnosis of SARS-CoV-2 by FDA under an Emergency Use Authorization (EUA). This EUA will remain in effect (meaning this test can be used) for the duration of the COVID-19 declaration under Section 564(b)(1) of the Act, 21 U.S.C. section 360bbb-3(b)(1), unless the authorization is terminated or revoked.  Performed at [facility], [address] [place], [place] [address]   Aerobic/Anaerobic Culture w Gram Stain  (surgical/deep wound)     Status: Abnormal (Preliminary result)   Collection Time: 01/25/21 11:39 AM   Specimen: BILE  Result Value Ref Range Status   Specimen Description   Final    BILE Performed at [facility], [address], [place], [place] [address]    Special Requests   Final    NONE Performed at [facility], [place], [place] [address]    Gram Stain   Final    NO SQUAMOUS EPITHELIAL CELLS SEEN FEW NO WBC SEEN FEW GRAM POSITIVE RODS MODERATE GRAM POSITIVE COCCI Performed at [facility], [place] [address] [place], [place] [address]    Culture (A)  Final    MULTIPLE ORGANISMS PRESENT, NONE PREDOMINANT NO STAPHYLOCOCCUS AUREUS ISOLATED NO GROUP A STREP (S.PYOGENES) ISOLATED NO ANAEROBES ISOLATED; CULTURE IN PROGRESS FOR 5 DAYS    Report Status PENDING  Incomplete         Radiology Studies: PERIPHERAL VASCULAR CATHETERIZATION  Result Date: 01/28/2021 See surgical note for result.       Scheduled Meds:  carvedilol  6.25 mg Oral BID WC   Chlorhexidine Gluconate Cloth  6 each Topical Q0600   diltiazem  120 mg Oral Daily   heparin  5,000 Units Subcutaneous Q8H   insulin aspart  0-5 Units Subcutaneous QHS   insulin aspart  0-9 Units Subcutaneous TID WC   isosorbide mononitrate  30 mg Oral QPM   multivitamin with minerals  1 tablet Oral Daily   pantoprazole  40 mg Oral Daily   sodium bicarbonate  650 mg Oral TID   sodium chloride flush  5 mL Intracatheter Q8H   vitamin B-12  1,000 mcg Oral Daily   Continuous Infusions:  piperacillin-tazobactam (ZOSYN)  IV Stopped (01/29/21 0622)     LOS: 5 days    Time spent: 25 minutes    Sidney Ace, MD [facility]   If 7PM-7AM, please contact night-coverage  01/29/2021, 2:08 PM

## 2021-01-29 NOTE — Progress Notes (Signed)
   01/29/21 0947 01/29/21 1000 01/29/21 1015  Vitals  BP (!) 147/85 134/67 (!) 146/97  MAP (mmHg) 100 85 113  Pulse Rate (!) 25 (!) 52 88  ECG Heart Rate (!) 122 (!) 118 (!) 135  Resp 19 15 16   During Hemodialysis Assessment  Blood Flow Rate (mL/min) 250 mL/min 250 mL/min 250 mL/min  Arterial Pressure (mmHg) -90 mmHg -80 mmHg -100 mmHg  Venous Pressure (mmHg) 70 mmHg 70 mmHg 70 mmHg  Transmembrane Pressure (mmHg) 70 mmHg 70 mmHg 70 mmHg  Ultrafiltration Rate (mL/min) 600 mL/min 600 mL/min 600 mL/min  Dialysate Flow Rate (mL/min) 500 ml/min 500 ml/min 500 ml/min  Conductivity: Machine  13.9 13.9 13.9  HD Safety Checks Performed Yes Yes Yes  Dialysis Fluid Bolus Normal Saline  --   --   Bolus Amount (mL) 250 mL  --   --   Intra-Hemodialysis Comments Tx initiated Progressing as prescribed Progressing as prescribed    01/29/21 1030 01/29/21 1045 01/29/21 1100  Vitals  BP 135/79 (!) 146/74 (!) 154/80  MAP (mmHg) 95 91 98  Pulse Rate 92 (!) 58 73  ECG Heart Rate (!) 114 (!) 120 (!) 126  Resp 18 (!) 22 15  During Hemodialysis Assessment  Blood Flow Rate (mL/min) 250 mL/min 250 mL/min 250 mL/min  Arterial Pressure (mmHg) -90 mmHg -90 mmHg -100 mmHg  Venous Pressure (mmHg) 70 mmHg 80 mmHg 70 mmHg  Transmembrane Pressure (mmHg) 70 mmHg 70 mmHg 70 mmHg  Ultrafiltration Rate (mL/min) 600 mL/min 600 mL/min 600 mL/min  Dialysate Flow Rate (mL/min) 500 ml/min 500 ml/min 500 ml/min  Conductivity: Machine  13.9 13.9 13.9  HD Safety Checks Performed Yes Yes Yes  Dialysis Fluid Bolus  --   --   --   Bolus Amount (mL)  --   --   --   Intra-Hemodialysis Comments Progressing as prescribed Progressing as prescribed Progressing as prescribed    01/29/21 1115  Vitals  BP (!) 153/73  MAP (mmHg)  --   Pulse Rate (!) 35  ECG Heart Rate (!) 122  Resp 19  During Hemodialysis Assessment  Blood Flow Rate (mL/min) 250 mL/min  Arterial Pressure (mmHg) -100 mmHg  Venous Pressure (mmHg) 80 mmHg   Transmembrane Pressure (mmHg) 70 mmHg  Ultrafiltration Rate (mL/min) 600 mL/min  Dialysate Flow Rate (mL/min) 500 ml/min  Conductivity: Machine  13.9  HD Safety Checks Performed Yes  Dialysis Fluid Bolus  --   Bolus Amount (mL)  --   Intra-Hemodialysis Comments Progressing as prescribed

## 2021-01-29 NOTE — Progress Notes (Addendum)
[facility]  ROUNDING NOTE   Subjective:   Gregory Dixon is a 80 year old male with past medical history of B12 deficiency, hypertension, hyperlipidemia, and CKD.  Patient presents to the emergency room with complaints of weakness.  Admitted to observation status for Lactic acid increased [E87.20] Weakness [R53.1] RUQ pain [R10.11] Cholecystitis [K81.9]  Patient is known to our clinic from previous admissions.  He is a patient of Dr. Holley Raring, though has not been seen since April 2021.    Patient seen and evaluated during dialysis   HEMODIALYSIS FLOWSHEET:  Blood Flow Rate (mL/min): 150 mL/min Arterial Pressure (mmHg): -50 mmHg Venous Pressure (mmHg): 60 mmHg Transmembrane Pressure (mmHg): 30 mmHg Ultrafiltration Rate (mL/min): 0 mL/min Dialysate Flow Rate (mL/min): 500 ml/min Conductivity: Machine : 13.9 Conductivity: Machine : 13.9 Dialysis Fluid Bolus: Normal Saline Bolus Amount (mL): 250 mL  Tolerated well No complaints at this time  Creatinine 6.44 (8.5)  Objective:  Vital signs in last 24 hours:  Temp:  [97.7 F (36.5 C)-98.5 F (36.9 C)] 98.4 F (36.9 C) (10/25 0920) Pulse Rate:  [25-139] 88 (10/25 1215) Resp:  [10-28] 17 (10/25 1215) BP: (110-155)/(52-106) 148/52 (10/25 1230) SpO2:  [95 %-100 %] 98 % (10/25 0920) Weight:  [102.4 kg-106.2 kg] 102.4 kg (10/25 1230)  Weight change:  Filed Weights   01/25/21 1015 01/29/21 0920 01/29/21 1230  Weight: 108.1 kg 106.2 kg 102.4 kg    Intake/Output: I/O last 3 completed shifts: In: 3197.4 [P.O.:480; I.V.:2493; Other:10; IV Piggyback:214.4] Out: 2241 [Urine:1650; Drains:590; Other:1]   Intake/Output this shift:  Total I/O In: 67.7 [I.V.:32.2; IV Piggyback:35.5] Out: 1025 [Drains:25; Other:1000]  Physical Exam: General: NAD, laying in bed  Head: Normocephalic, atraumatic. Moist oral mucosal membranes  Eyes: Anicteric  Lungs:  Clear to auscultation, normal effort  Heart: Irregular rhythm   Abdomen:  Soft, nontender  Extremities: 1+ peripheral edema.  Neurologic: Nonfocal, moving all four extremities  Skin: No lesions       Basic Metabolic Panel: Recent Labs  Lab 01/24/21 0609 01/25/21 0535 01/26/21 0945 01/28/21 0448 01/29/21 0603  NA 137 139 139 140 140  K 3.9 4.1 4.3 3.7 3.6  CL 109 110 108 106 108  CO2 18* 18* 17* 17* 21*  GLUCOSE 117* 127* 205* 117* 109*  BUN 83* 99* 109* 88* 79*  CREATININE 7.81* 8.37* 8.58* 8.50* 6.44*  CALCIUM 8.3* 8.2* 8.2* 7.6* 8.1*  MG 2.0  --   --   --   --   PHOS  --  6.7*  --   --   --      Liver Function Tests: Recent Labs  Lab 01/23/21 1058 01/24/21 0609 01/25/21 0535  AST 682* 303* 146*  ALT 189* 162* 131*  ALKPHOS 32* 31* 28*  BILITOT 0.8 0.6 0.4  PROT 6.2* 5.6* 5.3*  ALBUMIN 2.8* 2.5* 2.2*  2.4*    Recent Labs  Lab 01/23/21 1330  LIPASE 34    No results for input(s): AMMONIA in the last 168 hours.  CBC: Recent Labs  Lab 01/23/21 1058 01/24/21 0609 01/25/21 0535 01/26/21 0945  WBC 6.7 5.3 4.1 6.4  NEUTROABS 6.0  --  3.2 5.4  HGB 9.6* 8.8* 9.1* 9.8*  HCT 30.4* 26.6* 27.9* 31.8*  MCV 90.2 88.7 86.9 90.3  PLT 125* 128* 148* 182     Cardiac Enzymes: No results for input(s): CKTOTAL, CKMB, CKMBINDEX, TROPONINI in the last 168 hours.  BNP: Invalid input(s): POCBNP  CBG: Recent Labs  Lab 01/27/21 2041 01/28/21  6269 01/28/21 1204 01/28/21 2138 01/29/21 0724  GLUCAP 160* 118* 119* 112* 114*     Microbiology: Results for orders placed or performed during the hospital encounter of 01/23/21  Blood culture (routine x 2)     Status: Abnormal   Collection Time: 01/23/21  1:12 PM   Specimen: BLOOD  Result Value Ref Range Status   Specimen Description   Final    BLOOD RIGHT ANTECUBITAL Performed at [facility], [address] [place], [place] [address]    Special Requests   Final    BOTTLES DRAWN AEROBIC AND ANAEROBIC Blood Culture results may not be optimal due to an  inadequate volume of blood received in culture bottles Performed at [facility], [address] [place], [place] [address]    Culture  Setup Time   Final    GRAM NEGATIVE RODS IN BOTH AEROBIC AND ANAEROBIC BOTTLES CRITICAL RESULT CALLED TO, READ BACK BY AND VERIFIED WITH: NATHAN BLUE@0422  01/24/21 RH Performed at [facility], [address] [place], [place] [address]    Culture KLEBSIELLA OXYTOCA (A)  Final   Report Status 01/26/2021 FINAL  Final   Organism ID, Bacteria KLEBSIELLA OXYTOCA  Final      Susceptibility   Klebsiella oxytoca - MIC*    AMPICILLIN >=32 RESISTANT Resistant     CEFAZOLIN 8 SENSITIVE Sensitive     CEFEPIME <=0.12 SENSITIVE Sensitive     CEFTAZIDIME <=1 SENSITIVE Sensitive     CEFTRIAXONE <=0.25 SENSITIVE Sensitive     CIPROFLOXACIN <=0.25 SENSITIVE Sensitive     GENTAMICIN <=1 SENSITIVE Sensitive     IMIPENEM <=0.25 SENSITIVE Sensitive     TRIMETH/SULFA <=20 SENSITIVE Sensitive     AMPICILLIN/SULBACTAM 8 SENSITIVE Sensitive     PIP/TAZO <=4 SENSITIVE Sensitive     * KLEBSIELLA OXYTOCA  Blood Culture ID Panel (Reflexed)     Status: Abnormal   Collection Time: 01/23/21  1:12 PM  Result Value Ref Range Status   Enterococcus faecalis NOT DETECTED NOT DETECTED Final   Enterococcus Faecium NOT DETECTED NOT DETECTED Final   Listeria monocytogenes NOT DETECTED NOT DETECTED Final   Staphylococcus species NOT DETECTED NOT DETECTED Final   Staphylococcus aureus (BCID) NOT DETECTED NOT DETECTED Final   Staphylococcus epidermidis NOT DETECTED NOT DETECTED Final   Staphylococcus lugdunensis NOT DETECTED NOT DETECTED Final   Streptococcus species NOT DETECTED NOT DETECTED Final   Streptococcus agalactiae NOT DETECTED NOT DETECTED Final   Streptococcus pneumoniae NOT DETECTED NOT DETECTED Final   Streptococcus pyogenes NOT DETECTED NOT DETECTED Final   A.calcoaceticus-baumannii NOT DETECTED NOT DETECTED Final   Bacteroides fragilis NOT DETECTED  NOT DETECTED Final   Enterobacterales DETECTED (A) NOT DETECTED Final    Comment: Enterobacterales represent a large order of gram negative bacteria, not a single organism. CRITICAL RESULT CALLED TO, READ BACK BY AND VERIFIED WITH: NATHAN BLUE@0422  01/24/21 RH    Enterobacter cloacae complex NOT DETECTED NOT DETECTED Final   Escherichia coli NOT DETECTED NOT DETECTED Final   Klebsiella aerogenes NOT DETECTED NOT DETECTED Final   Klebsiella oxytoca DETECTED (A) NOT DETECTED Final    Comment: CRITICAL RESULT CALLED TO, READ BACK BY AND VERIFIED WITH: NATHAN BLUE@0422  01/24/21 RH    Klebsiella pneumoniae NOT DETECTED NOT DETECTED Final   Proteus species NOT DETECTED NOT DETECTED Final   Salmonella species NOT DETECTED NOT DETECTED Final   Serratia marcescens NOT DETECTED NOT DETECTED Final   Haemophilus influenzae NOT DETECTED NOT DETECTED Final  Neisseria meningitidis NOT DETECTED NOT DETECTED Final   Pseudomonas aeruginosa NOT DETECTED NOT DETECTED Final   Stenotrophomonas maltophilia NOT DETECTED NOT DETECTED Final   Candida albicans NOT DETECTED NOT DETECTED Final   Candida auris NOT DETECTED NOT DETECTED Final   Candida glabrata NOT DETECTED NOT DETECTED Final   Candida krusei NOT DETECTED NOT DETECTED Final   Candida parapsilosis NOT DETECTED NOT DETECTED Final   Candida tropicalis NOT DETECTED NOT DETECTED Final   Cryptococcus neoformans/gattii NOT DETECTED NOT DETECTED Final   CTX-M ESBL NOT DETECTED NOT DETECTED Final   Carbapenem resistance IMP NOT DETECTED NOT DETECTED Final   Carbapenem resistance KPC NOT DETECTED NOT DETECTED Final   Carbapenem resistance NDM NOT DETECTED NOT DETECTED Final   Carbapenem resist OXA 48 LIKE NOT DETECTED NOT DETECTED Final   Carbapenem resistance VIM NOT DETECTED NOT DETECTED Final    Comment: Performed at [facility], [address] [place], [place] [address]  Blood culture (routine x 2)     Status: Abnormal   Collection Time:  01/23/21  1:17 PM   Specimen: BLOOD  Result Value Ref Range Status   Specimen Description   Final    BLOOD BLOOD RIGHT FOREARM Performed at [facility], [address] [place], [place] [address]    Special Requests   Final    BOTTLES DRAWN AEROBIC AND ANAEROBIC Blood Culture results may not be optimal due to an inadequate volume of blood received in culture bottles Performed at [facility], [address] [place], [place] [address]    Culture  Setup Time   Final    GRAM NEGATIVE RODS IN BOTH AEROBIC AND ANAEROBIC BOTTLES CRITICAL VALUE NOTED.  VALUE IS CONSISTENT WITH PREVIOUSLY REPORTED AND CALLED VALUE. Performed at [facility] [address] [place], [place] [address]    Culture (A)  Final    KLEBSIELLA OXYTOCA SUSCEPTIBILITIES PERFORMED ON PREVIOUS CULTURE WITHIN THE LAST 5 DAYS. Performed at [facility], [place] [place], [place] [address]    Report Status 01/28/2021 FINAL  Final  Resp Panel by RT-PCR (Flu A&B, Covid) Nasopharyngeal Swab     Status: None   Collection Time: 01/23/21  4:49 PM   Specimen: Nasopharyngeal Swab; Nasopharyngeal(NP) swabs in vial transport medium  Result Value Ref Range Status   SARS Coronavirus 2 by RT PCR NEGATIVE NEGATIVE Final    Comment: (NOTE) SARS-CoV-2 target nucleic acids are NOT DETECTED.  The SARS-CoV-2 RNA is generally detectable in upper respiratory specimens during the acute phase of infection. The lowest concentration of SARS-CoV-2 viral copies this assay can detect is 138 copies/mL. A negative result does not preclude SARS-Cov-2 infection and should not be used as the sole basis for treatment or other patient management decisions. A negative result may occur with  improper specimen collection/handling, submission of specimen other than nasopharyngeal swab, presence of viral mutation(s) within the areas targeted by this assay, and inadequate number of viral copies(<138 copies/mL).  A negative result must be combined with clinical observations, patient history, and epidemiological information. The expected result is Negative.  Fact Sheet for Patients:  EntrepreneurPulse.com.au  Fact Sheet for Healthcare Providers:  IncredibleEmployment.be  This test is no t yet approved or cleared by the [place] FDA and  has been authorized for detection and/or diagnosis of SARS-CoV-2 by FDA under an Emergency Use Authorization (EUA). This EUA will remain  in effect (meaning this test can be used) for the duration of the  COVID-19 declaration under Section 564(b)(1) of the Act, 21 U.S.C.section 360bbb-3(b)(1), unless the authorization is terminated  or revoked sooner.       Influenza A by PCR NEGATIVE NEGATIVE Final   Influenza B by PCR NEGATIVE NEGATIVE Final    Comment: (NOTE) The Xpert Xpress SARS-CoV-2/FLU/RSV plus assay is intended as an aid in the diagnosis of influenza from Nasopharyngeal swab specimens and should not be used as a sole basis for treatment. Nasal washings and aspirates are unacceptable for Xpert Xpress SARS-CoV-2/FLU/RSV testing.  Fact Sheet for Patients: EntrepreneurPulse.com.au  Fact Sheet for Healthcare Providers: IncredibleEmployment.be  This test is not yet approved or cleared by the [place] FDA and has been authorized for detection and/or diagnosis of SARS-CoV-2 by FDA under an Emergency Use Authorization (EUA). This EUA will remain in effect (meaning this test can be used) for the duration of the COVID-19 declaration under Section 564(b)(1) of the Act, 21 U.S.C. section 360bbb-3(b)(1), unless the authorization is terminated or revoked.  Performed at [facility], [address] [place], [place] [address]   Aerobic/Anaerobic Culture w Gram Stain (surgical/deep wound)     Status: Abnormal (Preliminary result)   Collection Time: 01/25/21 11:39 AM    Specimen: BILE  Result Value Ref Range Status   Specimen Description   Final    BILE Performed at [facility], [address] [place], [place] [address]    Special Requests   Final    NONE Performed at [facility], [place], [place] [address]    Gram Stain   Final    NO SQUAMOUS EPITHELIAL CELLS SEEN FEW NO WBC SEEN FEW GRAM POSITIVE RODS MODERATE GRAM POSITIVE COCCI Performed at [facility], [place] [address] [place], [place] [address]    Culture (A)  Final    MULTIPLE ORGANISMS PRESENT, NONE PREDOMINANT NO STAPHYLOCOCCUS AUREUS ISOLATED NO GROUP A STREP (S.PYOGENES) ISOLATED NO ANAEROBES ISOLATED; CULTURE IN PROGRESS FOR 5 DAYS    Report Status PENDING  Incomplete    Coagulation Studies: No results for input(s): LABPROT, INR in the last 72 hours.   Urinalysis: No results for input(s): COLORURINE, LABSPEC, PHURINE, GLUCOSEU, HGBUR, BILIRUBINUR, KETONESUR, PROTEINUR, UROBILINOGEN, NITRITE, LEUKOCYTESUR in the last 72 hours.  Invalid input(s): APPERANCEUR    Imaging: PERIPHERAL VASCULAR CATHETERIZATION  Result Date: 01/28/2021 See surgical note for result.    Medications:    piperacillin-tazobactam (ZOSYN)  IV Stopped (01/29/21 0622)    carvedilol  3.125 mg Oral BID WC   Chlorhexidine Gluconate Cloth  6 each Topical Q0600   diltiazem  120 mg Oral Daily   heparin  5,000 Units Subcutaneous Q8H   insulin aspart  0-5 Units Subcutaneous QHS   insulin aspart  0-9 Units Subcutaneous TID WC   isosorbide mononitrate  30 mg Oral QPM   multivitamin with minerals  1 tablet Oral Daily   pantoprazole  40 mg Oral Daily   sodium bicarbonate  650 mg Oral TID   sodium chloride flush  5 mL Intracatheter Q8H   vitamin B-12  1,000 mcg Oral Daily   HYDROmorphone (DILAUDID) injection, ondansetron **OR** ondansetron (ZOFRAN) IV, ondansetron (ZOFRAN) IV, oxyCODONE  Assessment/ Plan:  [name] is a 80 y.o.  male with past  medical history of B12 deficiency, hypertension, hyperlipidemia, and CKD.  Patient presents to the emergency room with complaints of weakness.  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  Acute kidney injury secondary to prerenal azotemia due to progressive diarrhea and poor appetite. Losartan held Abdominal ultrasound showed slightly echogenic right kidney Dialysis initiated on 01/28/21. Received dialysis today, UF goal 1L achieved. Given po Carvedilol and Diltiazem during dialysis for a-fib with RVR. Tolerated well. Next treatment scheduled for tomorrow, seated in chair.  Lab Results  Component Value Date   CREATININE 6.44 (H) 01/29/2021   CREATININE 8.50 (H) 01/28/2021   CREATININE 8.58 (H) 01/26/2021    Intake/Output Summary (Last 24 hours) at 01/29/2021 1243 Last data filed at 01/29/2021 1230 Gross per 24 hour  Intake 942.89 ml  Output 1986 ml  Net -1043.11 ml    2. Anemia of chronic kidney disease Lab Results  Component Value Date   HGB 9.8 (L) 01/26/2021  Iron supplement twice daily outpatient Hgb at goal   3. Secondary Hyperparathyroidism:  Lab Results  Component Value Date   CALCIUM 8.1 (L) 01/29/2021   PHOS 6.7 (H) 01/25/2021  Calcium improved but remains low.   4.  Hypertension with chronic kidney disease.  Home regimen includes carvedilol, hydralazine laying, and isosorbide.  Currently receiving carvedilol, and isosorbide. BP 148/52  5.  Acute cholecystitis.  Abdominal ultrasound shows gallbladder sludge and nonmobile stone in gallbladder neck. HIDA scan consistent with cystic duct obstruction. Percutaneous cholecystostomy tube placed on 01/25/21.   LOS: [place] 10/25/202212:43 PM

## 2021-01-29 NOTE — Progress Notes (Signed)
   01/29/21 1130 01/29/21 1145 01/29/21 1200  Vitals  BP 122/76 115/81 (!) 143/77  MAP (mmHg) 90  --  93  Pulse Rate (!) 40 61 96  ECG Heart Rate (!) 123 (!) 112 (!) 115  Resp 11 11 11   During Hemodialysis Assessment  Blood Flow Rate (mL/min) 250 mL/min 250 mL/min 250 mL/min  Arterial Pressure (mmHg) -100 mmHg -100 mmHg -100 mmHg  Venous Pressure (mmHg) 70 mmHg 80 mmHg 70 mmHg  Transmembrane Pressure (mmHg) 70 mmHg 70 mmHg 70 mmHg  Ultrafiltration Rate (mL/min) 600 mL/min 600 mL/min 600 mL/min  Dialysate Flow Rate (mL/min) 500 ml/min 500 ml/min 500 ml/min  Conductivity: Machine  13.9 13.9 13.9  HD Safety Checks Performed Yes Yes Yes  Intra-Hemodialysis Comments Progressing as prescribed Progressing as prescribed Progressing as prescribed    01/29/21 1215 01/29/21 1230  Vitals  BP (!) 126/91 (!) 148/52  MAP (mmHg) 99  --   Pulse Rate 88  --   ECG Heart Rate (!) 113  --   Resp 17  --   During Hemodialysis Assessment  Blood Flow Rate (mL/min) 250 mL/min 150 mL/min  Arterial Pressure (mmHg) -100 mmHg -50 mmHg  Venous Pressure (mmHg) 70 mmHg 60 mmHg  Transmembrane Pressure (mmHg) 70 mmHg 30 mmHg  Ultrafiltration Rate (mL/min) 600 mL/min 0 mL/min  Dialysate Flow Rate (mL/min) 500 ml/min 500 ml/min  Conductivity: Machine  13.9 13.9  HD Safety Checks Performed Yes Yes  Intra-Hemodialysis Comments  --  Tx completed

## 2021-01-30 DIAGNOSIS — K8010 Calculus of gallbladder with chronic cholecystitis without obstruction: Secondary | ICD-10-CM | POA: Diagnosis not present

## 2021-01-30 LAB — IRON AND TIBC
Iron: 25 ug/dL — ABNORMAL LOW (ref 45–182)
Saturation Ratios: 12 % — ABNORMAL LOW (ref 17.9–39.5)
TIBC: 202 ug/dL — ABNORMAL LOW (ref 250–450)
UIBC: 177 ug/dL

## 2021-01-30 LAB — GLUCOSE, CAPILLARY
Glucose-Capillary: 140 mg/dL — ABNORMAL HIGH (ref 70–99)
Glucose-Capillary: 112 mg/dL — ABNORMAL HIGH (ref 70–99)
Glucose-Capillary: 178 mg/dL — ABNORMAL HIGH (ref 70–99)
Glucose-Capillary: 233 mg/dL — ABNORMAL HIGH (ref 70–99)

## 2021-01-30 LAB — BASIC METABOLIC PANEL
Anion gap: 11 (ref 5–15)
BUN: 55 mg/dL — ABNORMAL HIGH (ref 8–23)
CO2: 22 mmol/L (ref 22–32)
Calcium: 8 mg/dL — ABNORMAL LOW (ref 8.9–10.3)
Chloride: 106 mmol/L (ref 98–111)
Creatinine, Ser: 5.25 mg/dL — ABNORMAL HIGH (ref 0.61–1.24)
GFR, Estimated: 10 mL/min — ABNORMAL LOW (ref >60)
Glucose, Bld: 144 mg/dL — ABNORMAL HIGH (ref 70–99)
Potassium: 3.4 mmol/L — ABNORMAL LOW (ref 3.5–5.1)
Sodium: 139 mmol/L (ref 135–145)

## 2021-01-30 LAB — AEROBIC/ANAEROBIC CULTURE W GRAM STAIN (SURGICAL/DEEP WOUND): Gram Stain: NONE SEEN

## 2021-01-30 LAB — VITAMIN D 25 HYDROXY (VIT D DEFICIENCY, FRACTURES): Vit D, 25-Hydroxy: 20.79 ng/mL — ABNORMAL LOW (ref 30–100)

## 2021-01-30 LAB — MAGNESIUM: Magnesium: 2.1 mg/dL (ref 1.7–2.4)

## 2021-01-30 LAB — PHOSPHORUS: Phosphorus: 4.5 mg/dL (ref 2.5–4.6)

## 2021-01-30 LAB — FOLATE: Folate: 11.4 ng/mL (ref >5.9)

## 2021-01-30 MED ORDER — DILTIAZEM HCL 30 MG PO TABS
90.0000 mg | ORAL_TABLET | Freq: Four times a day (QID) | ORAL | Status: AC
  Administered 2021-01-30 – 2021-01-31 (×2): 90 mg via ORAL
  Filled 2021-01-30 (×2): qty 3

## 2021-01-30 MED ORDER — DILTIAZEM HCL ER COATED BEADS 180 MG PO CP24
360.0000 mg | ORAL_CAPSULE | Freq: Every day | ORAL | Status: DC
  Administered 2021-01-31: 360 mg via ORAL
  Filled 2021-01-30 (×2): qty 2

## 2021-01-30 MED ORDER — DILTIAZEM HCL 30 MG PO TABS: 90.0000 mg | ORAL_TABLET | Freq: Four times a day (QID) | ORAL | Status: DC

## 2021-01-30 MED ORDER — APIXABAN 2.5 MG PO TABS
2.5000 mg | ORAL_TABLET | Freq: Two times a day (BID) | ORAL | Status: DC
  Administered 2021-01-30 – 2021-01-31 (×2): 2.5 mg via ORAL
  Filled 2021-01-30 (×2): qty 1

## 2021-01-30 MED ORDER — DILTIAZEM HCL 60 MG PO TABS
60.0000 mg | ORAL_TABLET | Freq: Four times a day (QID) | ORAL | Status: DC
  Administered 2021-01-30: 60 mg via ORAL
  Filled 2021-01-30 (×2): qty 1

## 2021-01-30 MED ORDER — HEPARIN SODIUM (PORCINE) 1000 UNIT/ML IJ SOLN
INTRAMUSCULAR | Status: AC
  Filled 2021-01-30: qty 1

## 2021-01-30 NOTE — Progress Notes (Signed)
Patient cramping has eased, but states he still feels "tight" will leave uf at this time and continue to monitor the patient closely.

## 2021-01-30 NOTE — Progress Notes (Signed)
[facility]  ROUNDING NOTE   Subjective:   Gregory Dixon is a 80 year old male with past medical history of B12 deficiency, hypertension, hyperlipidemia, and CKD.  Patient presents to the emergency room with complaints of weakness.  Admitted to observation status for Lactic acid increased [E87.20] Weakness [R53.1] RUQ pain [R10.11] Cholecystitis [K81.9]  Patient is known to our clinic from previous admissions.  He is a patient of Dr. Holley Raring, though has not been seen since April 2021.    Patient seen and evaluated during dialysis   HEMODIALYSIS FLOWSHEET:  Blood Flow Rate (mL/min): 200 mL/min Arterial Pressure (mmHg): -200 mmHg Venous Pressure (mmHg): 150 mmHg Transmembrane Pressure (mmHg): 60 mmHg Ultrafiltration Rate (mL/min): 70 mL/min Dialysate Flow Rate (mL/min): 500 ml/min Conductivity: Machine : 13.4 Conductivity: Machine : 13.4 Dialysis Fluid Bolus: Normal Saline Bolus Amount (mL): 200 mL  Tolerating dialysis well in a chair   Objective:  Vital signs in last 24 hours:  Temp:  [98 F (36.7 C)-98.6 F (37 C)] 98 F (36.7 C) (10/26 1115) Pulse Rate:  [41-139] 123 (10/26 1130) Resp:  [9-28] 18 (10/26 1130) BP: (112-174)/(64-150) 159/109 (10/26 1130) SpO2:  [96 %-98 %] 98 % (10/26 0930) Weight:  [102.2 kg-103.9 kg] 102.2 kg (10/26 1130)  Weight change:  Filed Weights   01/29/21 1230 01/30/21 0804 01/30/21 1130  Weight: 102.4 kg 103.9 kg 102.2 kg    Intake/Output: I/O last 3 completed shifts: In: 1911.4 [P.O.:1560; I.V.:228.1; Other:15; IV Piggyback:108.3] Out: 2476 [Urine:1000; Drains:475; Other:1001]   Intake/Output this shift:  Total I/O In: -  Out: 1389 [Drains:175; Other:1214]  Physical Exam: General: NAD, laying in bed  Head: Normocephalic, atraumatic. Moist oral mucosal membranes  Eyes: Anicteric  Lungs:  Clear to auscultation, normal effort  Heart: Irregular rhythm  Abdomen:  Soft, nontender  Extremities: 1+ peripheral edema.   Neurologic: Nonfocal, moving all four extremities  Skin: No lesions  Access Rt IJ Permcath    Basic Metabolic Panel: Recent Labs  Lab 01/24/21 0609 01/25/21 0535 01/26/21 0945 01/28/21 0448 01/29/21 0603 01/30/21 0355  NA 137 139 139 140 140 139  K 3.9 4.1 4.3 3.7 3.6 3.4*  CL 109 110 108 106 108 106  CO2 18* 18* 17* 17* 21* 22  GLUCOSE 117* 127* 205* 117* 109* 144*  BUN 83* 99* 109* 88* 79* 55*  CREATININE 7.81* 8.37* 8.58* 8.50* 6.44* 5.25*  CALCIUM 8.3* 8.2* 8.2* 7.6* 8.1* 8.0*  MG 2.0  --   --   --   --   --   PHOS  --  6.7*  --   --   --   --      Liver Function Tests: Recent Labs  Lab 01/24/21 0609 01/25/21 0535  AST 303* 146*  ALT 162* 131*  ALKPHOS 31* 28*  BILITOT 0.6 0.4  PROT 5.6* 5.3*  ALBUMIN 2.5* 2.2*  2.4*    Recent Labs  Lab 01/23/21 1330  LIPASE 34    No results for input(s): AMMONIA in the last 168 hours.  CBC: Recent Labs  Lab 01/24/21 0609 01/25/21 0535 01/26/21 0945  WBC 5.3 4.1 6.4  NEUTROABS  --  3.2 5.4  HGB 8.8* 9.1* 9.8*  HCT 26.6* 27.9* 31.8*  MCV 88.7 86.9 90.3  PLT 128* 148* 182     Cardiac Enzymes: No results for input(s): CKTOTAL, CKMB, CKMBINDEX, TROPONINI in the last 168 hours.  BNP: Invalid input(s): POCBNP  CBG: Recent Labs  Lab 01/29/21 1255 01/29/21 1643 01/29/21 2117 01/30/21  1540 01/30/21 [place]*     Microbiology: Results for orders placed or performed during the hospital encounter of 01/23/21  Blood culture (routine x 2)     Status: Abnormal   Collection Time: 01/23/21  1:12 PM   Specimen: BLOOD  Result Value Ref Range Status   Specimen Description   Final    BLOOD RIGHT ANTECUBITAL Performed at [facility], [address] [place], [place] [address]    Special Requests   Final    BOTTLES DRAWN AEROBIC AND ANAEROBIC Blood Culture results may not be optimal due to an inadequate volume of blood received in culture bottles Performed at [facility], [address] [place], [place] [address]    Culture  Setup Time   Final    GRAM NEGATIVE RODS IN BOTH AEROBIC AND ANAEROBIC BOTTLES CRITICAL RESULT CALLED TO, READ BACK BY AND VERIFIED WITH: NATHAN BLUE@0422  01/24/21 RH Performed at [facility], [place] [place],  [address]    Culture KLEBSIELLA OXYTOCA (A)  Final   Report Status 01/26/2021 FINAL  Final   Organism ID, Bacteria KLEBSIELLA OXYTOCA  Final      Susceptibility   Klebsiella oxytoca - MIC*    AMPICILLIN >=32 RESISTANT Resistant     CEFAZOLIN 8 SENSITIVE Sensitive     CEFEPIME <=0.12 SENSITIVE Sensitive     CEFTAZIDIME <=1 SENSITIVE Sensitive     CEFTRIAXONE <=0.25 SENSITIVE Sensitive     CIPROFLOXACIN <=0.25 SENSITIVE Sensitive     GENTAMICIN <=1 SENSITIVE Sensitive     IMIPENEM <=0.25 SENSITIVE Sensitive     TRIMETH/SULFA <=20 SENSITIVE Sensitive     AMPICILLIN/SULBACTAM 8 SENSITIVE Sensitive     PIP/TAZO <=4 SENSITIVE Sensitive     * KLEBSIELLA OXYTOCA  Blood Culture ID Panel (Reflexed)     Status: Abnormal   Collection Time: 01/23/21  1:12 PM  Result Value Ref Range Status   Enterococcus faecalis NOT DETECTED NOT DETECTED Final   Enterococcus Faecium NOT DETECTED NOT DETECTED Final   Listeria monocytogenes NOT DETECTED NOT DETECTED Final   Staphylococcus species NOT DETECTED NOT DETECTED Final   Staphylococcus aureus (BCID) NOT DETECTED NOT DETECTED Final   Staphylococcus epidermidis NOT DETECTED NOT DETECTED Final   Staphylococcus lugdunensis NOT DETECTED NOT DETECTED Final   Streptococcus species NOT DETECTED NOT DETECTED Final   Streptococcus agalactiae NOT DETECTED NOT DETECTED Final   Streptococcus pneumoniae NOT DETECTED NOT DETECTED Final   Streptococcus pyogenes NOT DETECTED NOT DETECTED Final   A.calcoaceticus-baumannii NOT DETECTED NOT DETECTED Final   Bacteroides fragilis NOT DETECTED NOT DETECTED Final   Enterobacterales DETECTED (A) NOT DETECTED Final     Comment: Enterobacterales represent a large order of gram negative bacteria, not a single organism. CRITICAL RESULT CALLED TO, READ BACK BY AND VERIFIED WITH: NATHAN BLUE@0422  01/24/21 RH    Enterobacter cloacae complex NOT DETECTED NOT DETECTED Final   Escherichia coli NOT DETECTED NOT DETECTED Final   Klebsiella aerogenes NOT DETECTED NOT DETECTED Final   Klebsiella oxytoca DETECTED (A) NOT DETECTED Final    Comment: CRITICAL RESULT CALLED TO, READ BACK BY AND VERIFIED WITH: NATHAN BLUE@0422  01/24/21 RH    Klebsiella pneumoniae NOT DETECTED NOT DETECTED Final   Proteus species NOT DETECTED NOT DETECTED Final   Salmonella species NOT DETECTED NOT DETECTED Final   Serratia marcescens NOT DETECTED NOT DETECTED Final   Haemophilus influenzae NOT DETECTED NOT DETECTED Final   Neisseria meningitidis NOT DETECTED  NOT DETECTED Final   Pseudomonas aeruginosa NOT DETECTED NOT DETECTED Final   Stenotrophomonas maltophilia NOT DETECTED NOT DETECTED Final   Candida albicans NOT DETECTED NOT DETECTED Final   Candida auris NOT DETECTED NOT DETECTED Final   Candida glabrata NOT DETECTED NOT DETECTED Final   Candida krusei NOT DETECTED NOT DETECTED Final   Candida parapsilosis NOT DETECTED NOT DETECTED Final   Candida tropicalis NOT DETECTED NOT DETECTED Final   Cryptococcus neoformans/gattii NOT DETECTED NOT DETECTED Final   CTX-M ESBL NOT DETECTED NOT DETECTED Final   Carbapenem resistance IMP NOT DETECTED NOT DETECTED Final   Carbapenem resistance KPC NOT DETECTED NOT DETECTED Final   Carbapenem resistance NDM NOT DETECTED NOT DETECTED Final   Carbapenem resist OXA 48 LIKE NOT DETECTED NOT DETECTED Final   Carbapenem resistance VIM NOT DETECTED NOT DETECTED Final    Comment: Performed at [facility], [address] [place], [place] [address]  Blood culture (routine x 2)     Status: Abnormal   Collection Time: 01/23/21  1:17 PM   Specimen: BLOOD  Result Value Ref Range Status    Specimen Description   Final    BLOOD BLOOD RIGHT FOREARM Performed at [facility], [address] [place], [place] [address]    Special Requests   Final    BOTTLES DRAWN AEROBIC AND ANAEROBIC Blood Culture results may not be optimal due to an inadequate volume of blood received in culture bottles Performed at [facility], [address] [place], [place] [address]    Culture  Setup Time   Final    GRAM NEGATIVE RODS IN BOTH AEROBIC AND ANAEROBIC BOTTLES CRITICAL VALUE NOTED.  VALUE IS CONSISTENT WITH PREVIOUSLY REPORTED AND CALLED VALUE. Performed at [facility], [address] [place], [place] [address]    Culture (A)  Final    KLEBSIELLA OXYTOCA SUSCEPTIBILITIES PERFORMED ON PREVIOUS CULTURE WITHIN THE LAST 5 DAYS. Performed at [facility], [place] [address] [place], [place] [address]    Report Status 01/28/2021 FINAL  Final  Resp Panel by RT-PCR (Flu A&B, Covid) Nasopharyngeal Swab     Status: None   Collection Time: 01/23/21  4:49 PM   Specimen: Nasopharyngeal Swab; Nasopharyngeal(NP) swabs in vial transport medium  Result Value Ref Range Status   SARS Coronavirus 2 by RT PCR NEGATIVE NEGATIVE Final    Comment: (NOTE) SARS-CoV-2 target nucleic acids are NOT DETECTED.  The SARS-CoV-2 RNA is generally detectable in upper respiratory specimens during the acute phase of infection. The lowest concentration of SARS-CoV-2 viral copies this assay can detect is 138 copies/mL. A negative result does not preclude SARS-Cov-2 infection and should not be used as the sole basis for treatment or other patient management decisions. A negative result may occur with  improper specimen collection/handling, submission of specimen other than nasopharyngeal swab, presence of viral mutation(s) within the areas targeted by this assay, and inadequate number of viral copies(<138 copies/mL). A negative result must be combined with clinical observations, patient  history, and epidemiological information. The expected result is Negative.  Fact Sheet for Patients:  EntrepreneurPulse.com.au  Fact Sheet for Healthcare Providers:  IncredibleEmployment.be  This test is no t yet approved or cleared by the [place] FDA and  has been authorized for detection and/or diagnosis of SARS-CoV-2 by FDA under an Emergency Use Authorization (EUA). This EUA will remain  in effect (meaning this test can be used) for the duration of the COVID-19 declaration under Section  564(b)(1) of the Act, 21 U.S.C.section 360bbb-3(b)(1), unless the authorization is terminated  or revoked sooner.       Influenza A by PCR NEGATIVE NEGATIVE Final   Influenza B by PCR NEGATIVE NEGATIVE Final    Comment: (NOTE) The Xpert Xpress SARS-CoV-2/FLU/RSV plus assay is intended as an aid in the diagnosis of influenza from Nasopharyngeal swab specimens and should not be used as a sole basis for treatment. Nasal washings and aspirates are unacceptable for Xpert Xpress SARS-CoV-2/FLU/RSV testing.  Fact Sheet for Patients: EntrepreneurPulse.com.au  Fact Sheet for Healthcare Providers: IncredibleEmployment.be  This test is not yet approved or cleared by the [place] FDA and has been authorized for detection and/or diagnosis of SARS-CoV-2 by FDA under an Emergency Use Authorization (EUA). This EUA will remain in effect (meaning this test can be used) for the duration of the COVID-19 declaration under Section 564(b)(1) of the Act, 21 U.S.C. section 360bbb-3(b)(1), unless the authorization is terminated or revoked.  Performed at [facility], [address] [place], [place] [address]   Aerobic/Anaerobic Culture w Gram Stain (surgical/deep wound)     Status: Abnormal   Collection Time: 01/25/21 11:39 AM   Specimen: BILE  Result Value Ref Range Status   Specimen Description   Final     BILE Performed at [facility], [address] [place], [place] [address]    Special Requests   Final    NONE Performed at [facility], [place], [place] [address]    Gram Stain   Final    NO SQUAMOUS EPITHELIAL CELLS SEEN FEW NO WBC SEEN FEW GRAM POSITIVE RODS MODERATE GRAM POSITIVE COCCI    Culture (A)  Final    MULTIPLE ORGANISMS PRESENT, NONE PREDOMINANT NO STAPHYLOCOCCUS AUREUS ISOLATED NO GROUP A STREP (S.PYOGENES) ISOLATED NO ANAEROBES ISOLATED Performed at [facility], [place] [address], [place], [place] [address]    Report Status 01/30/2021 FINAL  Final    Coagulation Studies: No results for input(s): LABPROT, INR in the last 72 hours.   Urinalysis: No results for input(s): COLORURINE, LABSPEC, PHURINE, GLUCOSEU, HGBUR, BILIRUBINUR, KETONESUR, PROTEINUR, UROBILINOGEN, NITRITE, LEUKOCYTESUR in the last 72 hours.  Invalid input(s): APPERANCEUR    Imaging: PERIPHERAL VASCULAR CATHETERIZATION  Result Date: 01/28/2021 See surgical note for result.    Medications:    piperacillin-tazobactam (ZOSYN)  IV 2.25 g (01/30/21 0605)    carvedilol  6.25 mg Oral BID WC   Chlorhexidine Gluconate Cloth  6 each Topical Q0600   diltiazem  60 mg Oral Q6H   heparin  5,000 Units Subcutaneous Q8H   insulin aspart  0-5 Units Subcutaneous QHS   insulin aspart  0-9 Units Subcutaneous TID WC   isosorbide mononitrate  30 mg Oral QPM   multivitamin with minerals  1 tablet Oral Daily   pantoprazole  40 mg Oral Daily   sodium bicarbonate  650 mg Oral TID   sodium chloride flush  5 mL Intracatheter Q8H   vitamin B-12  1,000 mcg Oral Daily   HYDROmorphone (DILAUDID) injection, metoprolol tartrate, ondansetron **OR** ondansetron (ZOFRAN) IV, ondansetron (ZOFRAN) IV, oxyCODONE  Assessment/ Plan:  [name] is a 80 y.o.  male with past medical history of B12 deficiency, hypertension, hyperlipidemia, and CKD.  Patient presents to the  emergency room with complaints of weakness.  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  Acute kidney injury secondary to prerenal azotemia due to progressive diarrhea and poor appetite. Losartan held Abdominal ultrasound showed slightly echogenic right kidney Dialysis initiated on 01/28/21.  Received third dialysis treatment today, seated in chair.  Tolerated well.  Heart rate remains elevated 120s to 150s.  Nonsustained, A. fib.  Consulted cardiology.  Next dialysis treatment scheduled for Friday.  Dialysis coordinator awaiting confirmed chair time for outpatient treatment.  Lab Results  Component Value Date   CREATININE 5.25 (H) 01/30/2021   CREATININE 6.44 (H) 01/29/2021   CREATININE 8.50 (H) 01/28/2021    Intake/Output Summary (Last 24 hours) at 01/30/2021 1326 Last data filed at 01/30/2021 1300 Gross per 24 hour  Intake 1098.39 ml  Output 1999 ml  Net -900.61 ml    2. Anemia of chronic kidney disease Lab Results  Component Value Date   HGB 9.8 (L) 01/26/2021  Iron supplement twice daily outpatient   3. Secondary Hyperparathyroidism:  Lab Results  Component Value Date   CALCIUM 8.0 (L) 01/30/2021   PHOS 6.7 (H) 01/25/2021  Calcium improved but remains low.   4.  Hypertension with chronic kidney disease.  Home regimen includes carvedilol, hydralazine laying, and isosorbide.  Currently receiving carvedilol, and isosorbide. BP 159/109  5.  Acute cholecystitis.  Abdominal ultrasound shows gallbladder sludge and nonmobile stone in gallbladder neck. HIDA scan consistent with cystic duct obstruction. Percutaneous cholecystostomy tube placed on 01/25/21.   LOS: 6 Kemp Gomes 10/26/20221:26 PM

## 2021-01-30 NOTE — Progress Notes (Signed)
Patient on telemetry monitoring in Af[facility], spoke Nephrology NP regarding parameters for alarms, NP states to set at 140. Contacted CCMD to notify of change in alarm setting, spoke with Shay.

## 2021-01-30 NOTE — Consult Note (Signed)
CARDIOLOGY CONSULT NOTE               Patient ID: Gregory Dixon MRN: [id number] DOB/AGE: 12-08-1940 80 y.o.  Admit date: 01/23/2021 Referring Physician [facility] Primary Physician Kym Groom, Guy Begin, MD  Primary Cardiologist Fath Reason for Consultation new onset atrial fibrillation  HPI: 80 year old male referred for evaluation of new onset atrial fibrillation. 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 The patient presented to [facility] for a recent history of weakness, 15 pound weight loss in 1 month, and loss of appetite. CT abdomen shows a gallstone. HIDA scan positive for cholecystitis. AST and ALT were elevated to 682 and 189, respectively. The patient developed new onset atrial fibrillation with RVR and was started on diltiazem drip that was later transitioned to oral Cardizem. The patient underwent percutaneous cholecystostomy tube placement. PermCath was placed, and patient started hemodialysis on 01/28/21. The patient has had episodes of rapid ventricular rate since starting hemodialysis. ECG showed atrial fibrillation with RVR with LVH.   Review of systems complete and found to be negative unless listed above     Past Medical History:  Diagnosis Date   Actinic keratosis    Basal cell carcinoma 10/21/2017   left lateral neck, excised 02/09/2018   Basal cell carcinoma (BCC) 05/12/2019   right posterior ear, excised 05/31/2019   Blood transfusion without reported diagnosis    Diabetes mellitus without complication [facility])     Past Surgical History:  Procedure Laterality Date   COLONOSCOPY WITH PROPOFOL N/A 04/29/2019   Procedure: COLONOSCOPY WITH PROPOFOL;  Surgeon: Jonathon Bellows, MD;  Location: [facility] ENDOSCOPY;  Service: Gastroenterology;  Laterality: N/A;   DIALYSIS/PERMA CATHETER INSERTION Bilateral 01/28/2021   Procedure: DIALYSIS/PERMA CATHETER INSERTION;  Surgeon: Algernon Huxley, MD;  Location: [facility];  Service: Cardiovascular;  Laterality: Bilateral;   ESOPHAGOGASTRODUODENOSCOPY (EGD) WITH PROPOFOL N/A 04/29/2019   Procedure: ESOPHAGOGASTRODUODENOSCOPY (EGD) WITH PROPOFOL;  Surgeon: Jonathon Bellows, MD;  Location: [facility] ENDOSCOPY;  Service: Gastroenterology;  Laterality: N/A;   IR PERC CHOLECYSTOSTOMY  01/25/2021    Medications Prior to Admission  Medication Sig Dispense Refill Last Dose   carvedilol (COREG) 3.125 MG tablet Take 1 tablet (3.125 mg total) by mouth 2 (two) times daily with a meal. 60 tablet 0 01/23/2021   fenofibrate 160 MG tablet Take 160 mg by mouth at bedtime.      ferrous sulfate 325 (65 FE) MG tablet Take 325 mg by mouth 2 (two) times daily with a meal.   01/23/2021   hydrALAZINE (APRESOLINE) 50 MG tablet Take 1 tablet (50 mg total) by mouth 2 (two) times daily. 60 tablet 0 01/23/2021   insulin NPH-regular Human (70-30) 100 UNIT/ML injection Inject 55 Units into the skin 2 (two) times daily with a meal.      isosorbide mononitrate (IMDUR) 30 MG 24 hr tablet Take 30 mg by mouth at bedtime.   01/23/2021   losartan (COZAAR) 25 MG tablet Take 25 mg by mouth daily.      Multiple Vitamin (MULTIVITAMIN) capsule Take 1 capsule by mouth daily.   01/23/2021   olmesartan-hydrochlorothiazide (BENICAR HCT) 40-25 MG tablet Take 1 tablet by mouth daily.   01/23/2021   terazosin (HYTRIN) 10 MG capsule Take 10 mg by mouth at bedtime.   01/22/2021   vitamin B-12 (CYANOCOBALAMIN) 1000 MCG tablet Take 1 tablet (1,000 mcg total) by mouth daily. 30 tablet 0 01/23/2021   olmesartan (BENICAR) 40 MG tablet  Take 1 tablet (40 mg total) by mouth daily. (Patient not taking: Reported on 01/23/2021) 30 tablet 0 Not Taking   pantoprazole (PROTONIX) 40 MG tablet Take 1 tablet (40 mg total) by mouth daily. 30 tablet 0    Social History   Socioeconomic History   Marital status: Married    Spouse name: Not on file   Number of children: Not on file   Years of education: Not on file   Highest education  level: Not on file  Occupational History   Not on file  Tobacco Use   Smoking status: Former   Smokeless tobacco: Never  Vaping Use   Vaping Use: Never used  Substance and Sexual Activity   Alcohol use: Not Currently    Comment: rare once a year   Drug use: Never   Sexual activity: Not on file  Other Topics Concern   Not on file  Social History Narrative   Not on file   Social Determinants of Health   Financial Resource Strain: Not on file  Food Insecurity: Not on file  Transportation Needs: Not on file  Physical Activity: Not on file  Stress: Not on file  Social Connections: Not on file  Intimate Partner Violence: Not on file    History reviewed. No pertinent family history.    Review of systems complete and found to be negative unless listed above      PHYSICAL EXAM  General: Well developed, well nourished, elderly gentleman sitting up in bed in no acute distress HEENT:  Normocephalic and atramatic Neck:  No JVD.  Lungs: normal effort of breathing on room air. Heart: irregularly irregular without gallops or murmurs.  Msk:  no obvious deformities Extremities: No clubbing, cyanosis or edema.   Neuro: Alert and oriented X 3. Psych:  Good affect, responds appropriately  Labs:   Lab Results  Component Value Date   WBC 6.4 01/26/2021   HGB 9.8 (L) 01/26/2021   HCT 31.8 (L) 01/26/2021   MCV 90.3 01/26/2021   PLT 182 01/26/2021    Recent Labs  Lab 01/25/21 0535 01/26/21 0945 01/30/21 0355  NA 139   < > 139  K 4.1   < > 3.4*  CL 110   < > 106  CO2 18*   < > 22  BUN 99*   < > 55*  CREATININE 8.37*   < > 5.25*  CALCIUM 8.2*   < > 8.0*  PROT 5.3*  --   --   BILITOT 0.4  --   --   ALKPHOS 28*  --   --   ALT 131*  --   --   AST 146*  --   --   GLUCOSE 127*   < > 144*   < > = values in this interval not displayed.   No results found for: CKTOTAL, CKMB, CKMBINDEX, TROPONINI No results found for: CHOL No results found for: HDL No results found for:  LDLCALC No results found for: TRIG No results found for: CHOLHDL No results found for: LDLDIRECT    Radiology: CT ABDOMEN PELVIS WO CONTRAST  Result Date: 01/23/2021 CLINICAL DATA:  Abdominal pain, acute, nonlocalized. Generalized weakness. Hypotension. EXAM: CT ABDOMEN AND PELVIS WITHOUT CONTRAST TECHNIQUE: Multidetector CT imaging of the abdomen and pelvis was performed following the standard protocol without IV contrast. COMPARISON:  08/09/2012 FINDINGS: Lower chest: Linear scarring or atelectasis at the right lung base. Otherwise no active lower chest finding. Hepatobiliary: Liver appears normal without contrast. 1  cm calcified stone in the gallbladder neck without definite CT evidence of cholecystitis. Consider right upper quadrant ultrasound if concern persists regarding possible clinical cholecystitis. Pancreas: Normal Spleen: Normal Adrenals/Urinary Tract: Adrenal glands are normal. Multiple bilateral renal cysts. Nonobstructing 6 mm stone in the lower pole of the left kidney. No passing stone or hydronephrosis. No stone in the bladder. Stomach/Bowel: Stomach and small intestine are normal. No evidence of diverticulosis or diverticulitis. Vascular/Lymphatic: Aortic atherosclerosis. No aneurysm. IVC is normal. No retroperitoneal adenopathy. Reproductive: Enlarged prostate. Other: Tiny amount of free fluid in the pelvic cul-de-sac. No free air. Musculoskeletal: Ordinary lower lumbar degenerative changes. IMPRESSION: 1 cm gallstone in the gallbladder neck. No CT evidence of cholecystitis. Consider right upper quadrant ultrasound if there is clinical concern regarding potential cholecystitis. Aortic Atherosclerosis (ICD10-I70.0). Multiple renal cysts. 6 mm nonobstructing stone in the lower pole of the left kidney. Enlarged prostate. Electronically Signed   By: Nelson Chimes M.D.   On: 01/23/2021 14:58   NM Hepatobiliary Liver Func  Result Date: 01/24/2021 CLINICAL DATA:  Abdominal pain.   Cholelithiasis EXAM: NUCLEAR MEDICINE HEPATOBILIARY IMAGING TECHNIQUE: Sequential images of the abdomen were obtained out to 60 minutes following intravenous administration of radiopharmaceutical. RADIOPHARMACEUTICALS:  5.6 mCi Tc-2m  Choletec IV COMPARISON:  CT 01/23/2021 FINDINGS: Prompt clearance of radiotracer from blood pool and homogeneous uptake in liver. Counts are evident in the small bowel by 50 minutes. At 90 minutes, IV morphine was administered. Gallbladder failed to contract after morphine augmentation. IMPRESSION: Non filling of the gall bladder is consistent with cystic duct obstruction (acute cholecystitis). These results will be called to the ordering clinician or representative by the Radiologist Assistant, and communication documented in the PACS or [employer]. Electronically Signed   By: Suzy Bouchard M.D.   On: 01/24/2021 15:59   PERIPHERAL VASCULAR CATHETERIZATION  Result Date: 01/28/2021 See surgical note for result.  IR Perc Cholecystostomy  Result Date: 01/25/2021 INDICATION: 80 year old with evidence of acute cholecystitis based on imaging. Request for a percutaneous cholecystostomy tube placement. EXAM: Percutaneous cholecystostomy tube placement with ultrasound and fluoroscopic guidance. MEDICATIONS: Moderate sedation.  Patient is receiving scheduled IV antibiotics. ANESTHESIA/SEDATION: Moderate (conscious) sedation was employed during this procedure. A total of Versed 1.0mg  and fentanyl 50 mcg was administered intravenously at the order of the provider performing the procedure. Total intra-service moderate sedation time: 14 minutes. Patient's level of consciousness and vital signs were monitored continuously by radiology nurse throughout the procedure under the supervision of the provider performing the procedure. FLUOROSCOPY TIME:  Fluoroscopy Time: 12 seconds, 2.4 mGy CONTRAST:  3 mL Omnipaque [id number] COMPLICATIONS: None immediate. PROCEDURE: Informed written consent  was obtained from the patient after a thorough discussion of the procedural risks, benefits and alternatives. All questions were addressed. A timeout was performed prior to the initiation of the procedure. Ultrasound was used to identify the gallbladder. Ultrasound image was saved for documentation. The right side of the abdomen was prepped and draped in sterile fashion. Maximal barrier sterile technique was utilized including caps, mask, sterile gowns, sterile gloves, sterile drape, hand hygiene and skin antiseptic. Skin was anesthetized using 1% lidocaine. Small incision was made. Using ultrasound guidance, an 18 gauge trocar needle was directed into the gallbladder using a transhepatic approach. A superstiff Amplatz wire was advanced to the gallbladder using ultrasound and fluoroscopic guidance. The tract was dilated to accommodate a 10 [place] multipurpose drain. Cloudy bilious fluid was aspirated from the gallbladder. The gallbladder was decompressed at the end of the procedure.  Fluid sample sent for culture. Drain was secured to the skin with silk suture and attached to a gravity bag. Dressing was placed. FINDINGS: Gallbladder was mildly distended at the beginning of the procedure. Drain successfully placed in the gallbladder and the gallbladder was decompressed at the end of the procedure. Fluoroscopy demonstrates a stone near the base of the gallbladder. IMPRESSION: Successful percutaneous cholecystostomy tube placement with ultrasound and fluoroscopic guidance. Electronically Signed   By: Markus Daft M.D.   On: 01/25/2021 13:06   DG Chest Port 1 View  Result Date: 01/23/2021 CLINICAL DATA:  Weakness Increase lactic acid EXAM: PORTABLE CHEST 1 VIEW COMPARISON:  None. FINDINGS: Heart size within normal limits. No pulmonary vascular congestion. Right hemidiaphragm is elevated. Lungs are clear. IMPRESSION: No acute cardiopulmonary process. Electronically Signed   By: Miachel Roux M.D.   On: 01/23/2021 16:32    US Abdomen Limited RUQ (LIVER/GB)  Result Date: 01/23/2021 CLINICAL DATA:  Right upper quadrant pain EXAM: ULTRASOUND ABDOMEN LIMITED RIGHT UPPER QUADRANT COMPARISON:  CT 01/23/2021 FINDINGS: Gallbladder: Sludge within the gallbladder. 16 mm non mobile stone at the gallbladder neck. Slight increased wall thickness of 3.7 mm but negative sonographic Murphy. Common bile duct: Diameter: 5.2 mm Liver: No focal lesion identified. Within normal limits in parenchymal echogenicity. Portal vein is patent on color Doppler imaging with normal direction of blood flow towards the liver. Other: Right kidney cortex appears slightly echogenic. IMPRESSION: 1. Sludge in the gallbladder with non mobile stone at the gallbladder neck with slight increased wall thickness but negative sonographic Murphy. Findings are indeterminate for cholecystitis, consider correlation with nuclear medicine hepatobiliary imaging 2. Right kidney cortex appears slightly echogenic suggesting medical renal disease, correlate with appropriate laboratory values Electronically Signed   By: Donavan Foil M.D.   On: 01/23/2021 15:45    EKG: atrial fibrillation, with RVR LVH  ASSESSMENT AND PLAN:  New onset atrial fibrillation, in the setting of acute cholecystitis and AKI on CKD V with initiation of hemodialysis during this admission. The patient has been on diltiazem drip, which was transitioned to oral Cardizem and carvedilol. Patient has a chads vasc score of 4. AKI on CKD V, now on hemodialysis Acute cholecystitis, status post percutaneous cholecystostomy, on antibiotics  Plan: Switch to cardizem 90 mg q 6 hours x 2 more doses, then switch to Cardizem CD 360 mg in the morning. Continue Carvedilol 6.25 mg BID Start Eliquis 2.5 mg BID for stroke prevention 3.   2D echocardiogram as outpatient 4.   Follow up with Dr. Saralyn Pilar in 1-2 week(s) as outpatient   Signed: Clabe Seal PA-C 01/30/2021, 1:02 PM

## 2021-01-30 NOTE — Progress Notes (Signed)
Patient tolerated treatment well in chair, patient pulled of 1.2KG with some mild cramping resulting in uf being  turned off. Patient is stable and alert will do a standing post weight.

## 2021-01-30 NOTE — Progress Notes (Signed)
Patient complaint of cramping in Right arm and hand, Uf has been turned off at this time and 225ml ns was given to help ease the cramp. RN Helene Kelp is aware of the change with the patient. The patient is sitting in dialysis chair and states he feels fine despite the cramping in his right arm and hand.

## 2021-01-30 NOTE — Progress Notes (Signed)
PROGRESS NOTE    Gregory Dixon  NFA:[id number] DOB: 07/14/1940 DOA: 01/23/2021 PCP: Valera Castle, MD    Brief Narrative:  80 year old male past medical history for chronic kidney disease stage IV, B12 deficiency, hypertension and dyslipidemia who presented with weakness.  Reported 3-4 days of chills, difficulty ambulating, no chest pain, dyspnea or abdominal pain.   Abdominal ultrasound with sludge in the gallbladder, nonmobile stone at the gallbladder neck with slight increase wall thickness but negative sonographic Murphy sign.   Patient has been placed on intravenous antibiotics, surgery was consulted. Recommendations to get a HIDA scan.  HIDA scan positive.  Considering patient's medical comorbidities and high surgical risk and the recommendation was made for a percutaneous cholecystostomy.  This was placed 10/21.  Patient tolerated without issue.   Patient developed new onset atrial fibrillation with rapid ventricular response, placed on diltiazem drip.  Diltiazem drip will be weaned off and transition to p.o. Cardizem on 10/21  Patient also has deteriorating kidney function.  AKI on CKD stage IV.  Will likely need initiation of dialysis.  Initial plan was to place a peritoneal dialysis catheter intraoperatively when patient stable for cholecystectomy.  However this may be 2 months from now and patient's kidney function may deteriorate before that time.  Nephrology following and will discuss with patient regarding initiation of temporary hemodialysis.  Nephrology discussed with patient.  He is in agreement to start temporary hemodialysis.  PermCath placed Monday 10/24.  Successful HD Monday 10/24, Tuesday 10/25 and Wednesday 10/26   Assessment & Plan:   Principal Problem:   Cholelithiasis Active Problems:   Benign prostatic hyperplasia   Chronic kidney disease   Diabetic peripheral neuropathy associated with type 2 diabetes mellitus (HCC)   Hyperlipidemia   Anemia  associated with chronic renal failure   Weakness   Elevated LFTs   Transaminitis   Cholecystitis  Cholecystitis Positive HIDA scan General surgery consulted, recommend percutaneous cholecystostomy Percutaneous cholecystostomy placed 10/21 Plan: Maintain drain to gravity As needed pain control Antibiotic therapy with Zosyn, d/w pharmacy, recommended transition to Cipro and Flagyl instead of Augmentin due to susceptibility General surgery signed off 10/22  AKI on CKD stage V Non-anion gap metabolic acidosis Deteriorating kidney function Patient will likely need some form of renal replacement therapy Initially plan was for PD catheter placement during cholecystectomy Kidney function may deteriorate before that time requiring initiation of temporary hemodialysis Discussed with nephrology PermCath placed 10/24 Started HD on 10/24 Plan: Discontinue maintenance fluids Continue sodium bicarb oral Avoid hypotension and nephrotoxins HD today 10/26  New onset atrial fibrillation Noted on 10/20 Initially good rate control Has been intermittently rapid rate since initiation of HD  S/p Cardizem IV infusion, transition to Cardizem CD 120  Plan: 10/26 still patient has A. fib with RVR, discontinued Cardizem CD and started Cardizem IR 60 every 6 hourly  Continue Coreg 6.25 twice daily As needed metoprolol push for sustained tachycardia Telemetry monitoring Cardiology consulted, Cardizem IR increased from 60-90 and plan is to switch to Cardizem CD tomorrow a.m. Eliquis 2.5 mg p.o. twice daily started for stroke prophylaxis.  Risk and benefit explained to patient Continue to follow cardiology as an outpatient   Essential hypertension Imdur resumed Coreg was increased, started Cardizem as above   GERD PPI  Chronic anemia/MGUS No indication for transfusion Continue to follow cell counts closely  DVT prophylaxis: SCD Code Status: Full Family Communication: Family member at  bedside 10/22 Disposition Plan: Status is: Inpatient  Remains inpatient appropriate because:  Deteriorating kidney function.  New onset atrial fibrillation.  Acute cholecystitis requiring cholecystostomy tube placement.  PermCath placed 10/25.  Will need in-house dialysis until least tomorrow and arrangement of outpatient HD chair prior to disposition.      Level of care: Progressive Cardiac  Consultants:  General surgery Interventional radiology Nephrology  Procedures:  Percutaneous cholecystostomy 10/21 PermCath placement 10/24  Antimicrobials: Zosyn   Subjective: No significant overnight events, patient was resting comfortably on the bed.  Patient denied any chest pain or palpitation, no any other active symptoms. Patient was seen after hemodialysis, patient remained in persistent A. fib with RVR, but remained asymptomatic.   Objective: Vitals:   01/30/21 1045 01/30/21 1100 01/30/21 1115 01/30/21 1130  BP: 134/85 (!) 141/101 (!) 165/94 (!) 159/109  Pulse: (!) 122 (!) 106 67 (!) 123  Resp: 17 17 17 18   Temp:   98 F (36.7 C)   TempSrc:   Oral   SpO2:      Weight:    102.2 kg  Height:        Intake/Output Summary (Last 24 hours) at 01/30/2021 1648 Last data filed at 01/30/2021 1300 Gross per 24 hour  Intake 845 ml  Output 1909 ml  Net -1064 ml   Filed Weights   01/29/21 1230 01/30/21 0804 01/30/21 1130  Weight: 102.4 kg 103.9 kg 102.2 kg    Examination:  General exam: No acute distress Respiratory system: Lungs clear.  Normal work of breathing.  Room air Cardiovascular system: Irregular rhythm, no murmurs, no pedal edema gastrointestinal system: Soft, NT/ND, right-sided percutaneous cholecystostomy Central nervous system: Alert and oriented. No focal neurological deficits. Extremities: Symmetric 5 x 5 power. Skin: No rashes, lesions or ulcers Psychiatry: Judgement and insight appear normal. Mood & affect appropriate.     Data Reviewed: I have  personally reviewed following labs and imaging studies  CBC: Recent Labs  Lab 01/24/21 0609 01/25/21 0535 01/26/21 0945  WBC 5.3 4.1 6.4  NEUTROABS  --  3.2 5.4  HGB 8.8* 9.1* 9.8*  HCT 26.6* 27.9* 31.8*  MCV 88.7 86.9 90.3  PLT 128* 148* 174   Basic Metabolic Panel: Recent Labs  Lab 01/24/21 0609 01/25/21 0535 01/26/21 0945 01/28/21 0448 01/29/21 0603 01/30/21 0355  NA 137 139 139 140 140 139  K 3.9 4.1 4.3 3.7 3.6 3.4*  CL 109 110 108 106 108 106  CO2 18* 18* 17* 17* 21* 22  GLUCOSE 117* 127* 205* 117* 109* 144*  BUN 83* 99* 109* 88* 79* 55*  CREATININE 7.81* 8.37* 8.58* 8.50* 6.44* 5.25*  CALCIUM 8.3* 8.2* 8.2* 7.6* 8.1* 8.0*  MG 2.0  --   --   --   --  2.1  PHOS  --  6.7*  --   --   --  4.5   GFR: Estimated Creatinine Clearance: 14.7 mL/min (A) (by C-G formula based on SCr of 5.25 mg/dL (H)). Liver Function Tests: Recent Labs  Lab 01/24/21 0609 01/25/21 0535  AST 303* 146*  ALT 162* 131*  ALKPHOS 31* 28*  BILITOT 0.6 0.4  PROT 5.6* 5.3*  ALBUMIN 2.5* 2.2*  2.4*   No results for input(s): LIPASE, AMYLASE in the last 168 hours.  No results for input(s): AMMONIA in the last 168 hours. Coagulation Profile: Recent Labs  Lab 01/25/21 0535  INR 1.3*   Cardiac Enzymes: No results for input(s): CKTOTAL, CKMB, CKMBINDEX, TROPONINI in the last 168 hours. BNP (last 3 results) No results for input(s): PROBNP in the  last 8760 hours. HbA1C: No results for input(s): HGBA1C in the last 72 hours.  CBG: Recent Labs  Lab 01/29/21 1643 01/29/21 2117 01/30/21 0826 01/30/21 1208 01/30/21 1641  GLUCAP 181* 146* 140* 112* 178*   Lipid Profile: No results for input(s): CHOL, HDL, LDLCALC, TRIG, CHOLHDL, LDLDIRECT in the last 72 hours. Thyroid Function Tests: No results for input(s): TSH, T4TOTAL, FREET4, T3FREE, THYROIDAB in the last 72 hours.  Anemia Panel: Recent Labs    01/30/21 0355  FOLATE 11.4  TIBC 202*  IRON 25*    Sepsis Labs: Recent Labs   Lab 01/23/21 1751  LATICACIDVEN 1.1    Recent Results (from the past 240 hour(s))  Blood culture (routine x 2)     Status: Abnormal   Collection Time: 01/23/21  1:12 PM   Specimen: BLOOD  Result Value Ref Range Status   Specimen Description   Final    BLOOD RIGHT ANTECUBITAL Performed at [facility], [address], [place], [place] [address]    Special Requests   Final    BOTTLES DRAWN AEROBIC AND ANAEROBIC Blood Culture results may not be optimal due to an inadequate volume of blood received in culture bottles Performed at [facility], [address] [place], [place] [address]    Culture  Setup Time   Final    GRAM NEGATIVE RODS IN BOTH AEROBIC AND ANAEROBIC BOTTLES CRITICAL RESULT CALLED TO, READ BACK BY AND VERIFIED WITH: NATHAN BLUE@0422  01/24/21 RH Performed at [facility], [address] [place], [place] [address]    Culture KLEBSIELLA OXYTOCA (A)  Final   Report Status 01/26/2021 FINAL  Final   Organism ID, Bacteria KLEBSIELLA OXYTOCA  Final      Susceptibility   Klebsiella oxytoca - MIC*    AMPICILLIN >=32 RESISTANT Resistant     CEFAZOLIN 8 SENSITIVE Sensitive     CEFEPIME <=0.12 SENSITIVE Sensitive     CEFTAZIDIME <=1 SENSITIVE Sensitive     CEFTRIAXONE <=0.25 SENSITIVE Sensitive     CIPROFLOXACIN <=0.25 SENSITIVE Sensitive     GENTAMICIN <=1 SENSITIVE Sensitive     IMIPENEM <=0.25 SENSITIVE Sensitive     TRIMETH/SULFA <=20 SENSITIVE Sensitive     AMPICILLIN/SULBACTAM 8 SENSITIVE Sensitive     PIP/TAZO <=4 SENSITIVE Sensitive     * KLEBSIELLA OXYTOCA  Blood Culture ID Panel (Reflexed)     Status: Abnormal   Collection Time: 01/23/21  1:12 PM  Result Value Ref Range Status   Enterococcus faecalis NOT DETECTED NOT DETECTED Final   Enterococcus Faecium NOT DETECTED NOT DETECTED Final   Listeria monocytogenes NOT DETECTED NOT DETECTED Final   Staphylococcus species NOT DETECTED NOT DETECTED Final   Staphylococcus aureus  (BCID) NOT DETECTED NOT DETECTED Final   Staphylococcus epidermidis NOT DETECTED NOT DETECTED Final   Staphylococcus lugdunensis NOT DETECTED NOT DETECTED Final   Streptococcus species NOT DETECTED NOT DETECTED Final   Streptococcus agalactiae NOT DETECTED NOT DETECTED Final   Streptococcus pneumoniae NOT DETECTED NOT DETECTED Final   Streptococcus pyogenes NOT DETECTED NOT DETECTED Final   A.calcoaceticus-baumannii NOT DETECTED NOT DETECTED Final   Bacteroides fragilis NOT DETECTED NOT DETECTED Final   Enterobacterales DETECTED (A) NOT DETECTED Final    Comment: Enterobacterales represent a large order of gram negative bacteria, not a single organism. CRITICAL RESULT CALLED TO, READ BACK BY AND VERIFIED WITH: NATHAN BLUE@0422  01/24/21 RH    Enterobacter cloacae complex NOT DETECTED NOT DETECTED Final   Escherichia coli NOT DETECTED NOT DETECTED  Final   Klebsiella aerogenes NOT DETECTED NOT DETECTED Final   Klebsiella oxytoca DETECTED (A) NOT DETECTED Final    Comment: CRITICAL RESULT CALLED TO, READ BACK BY AND VERIFIED WITH: NATHAN BLUE@0422  01/24/21 RH    Klebsiella pneumoniae NOT DETECTED NOT DETECTED Final   Proteus species NOT DETECTED NOT DETECTED Final   Salmonella species NOT DETECTED NOT DETECTED Final   Serratia marcescens NOT DETECTED NOT DETECTED Final   Haemophilus influenzae NOT DETECTED NOT DETECTED Final   Neisseria meningitidis NOT DETECTED NOT DETECTED Final   Pseudomonas aeruginosa NOT DETECTED NOT DETECTED Final   Stenotrophomonas maltophilia NOT DETECTED NOT DETECTED Final   Candida albicans NOT DETECTED NOT DETECTED Final   Candida auris NOT DETECTED NOT DETECTED Final   Candida glabrata NOT DETECTED NOT DETECTED Final   Candida krusei NOT DETECTED NOT DETECTED Final   Candida parapsilosis NOT DETECTED NOT DETECTED Final   Candida tropicalis NOT DETECTED NOT DETECTED Final   Cryptococcus neoformans/gattii NOT DETECTED NOT DETECTED Final   CTX-M ESBL NOT  DETECTED NOT DETECTED Final   Carbapenem resistance IMP NOT DETECTED NOT DETECTED Final   Carbapenem resistance KPC NOT DETECTED NOT DETECTED Final   Carbapenem resistance NDM NOT DETECTED NOT DETECTED Final   Carbapenem resist OXA 48 LIKE NOT DETECTED NOT DETECTED Final   Carbapenem resistance VIM NOT DETECTED NOT DETECTED Final    Comment: Performed at [facility], [address] [place], [place] [address]  Blood culture (routine x 2)     Status: Abnormal   Collection Time: 01/23/21  1:17 PM   Specimen: BLOOD  Result Value Ref Range Status   Specimen Description   Final    BLOOD BLOOD RIGHT FOREARM Performed at [facility], [address] [place], [place] [address]    Special Requests   Final    BOTTLES DRAWN AEROBIC AND ANAEROBIC Blood Culture results may not be optimal due to an inadequate volume of blood received in culture bottles Performed at [facility] [address] [place], [place] [address]    Culture  Setup Time   Final    GRAM NEGATIVE RODS IN BOTH AEROBIC AND ANAEROBIC BOTTLES CRITICAL VALUE NOTED.  VALUE IS CONSISTENT WITH PREVIOUSLY REPORTED AND CALLED VALUE. Performed at [facility], [address] [place], [place] [address]    Culture (A)  Final    KLEBSIELLA OXYTOCA SUSCEPTIBILITIES PERFORMED ON PREVIOUS CULTURE WITHIN THE LAST 5 DAYS. Performed at [facility], [place] [address] [place], [place] [address]    Report Status 01/28/2021 FINAL  Final  Resp Panel by RT-PCR (Flu A&B, Covid) Nasopharyngeal Swab     Status: None   Collection Time: 01/23/21  4:49 PM   Specimen: Nasopharyngeal Swab; Nasopharyngeal(NP) swabs in vial transport medium  Result Value Ref Range Status   SARS Coronavirus 2 by RT PCR NEGATIVE NEGATIVE Final    Comment: (NOTE) SARS-CoV-2 target nucleic acids are NOT DETECTED.  The SARS-CoV-2 RNA is generally detectable in upper respiratory specimens during the acute phase of infection. The  lowest concentration of SARS-CoV-2 viral copies this assay can detect is 138 copies/mL. A negative result does not preclude SARS-Cov-2 infection and should not be used as the sole basis for treatment or other patient management decisions. A negative result may occur with  improper specimen collection/handling, submission of specimen other than nasopharyngeal swab, presence of viral mutation(s) within the areas targeted by this assay, and inadequate number of viral copies(<138 copies/mL). A  negative result must be combined with clinical observations, patient history, and epidemiological information. The expected result is Negative.  Fact Sheet for Patients:  EntrepreneurPulse.com.au  Fact Sheet for Healthcare Providers:  IncredibleEmployment.be  This test is no t yet approved or cleared by the [place] FDA and  has been authorized for detection and/or diagnosis of SARS-CoV-2 by FDA under an Emergency Use Authorization (EUA). This EUA will remain  in effect (meaning this test can be used) for the duration of the COVID-19 declaration under Section 564(b)(1) of the Act, 21 U.S.C.section 360bbb-3(b)(1), unless the authorization is terminated  or revoked sooner.       Influenza A by PCR NEGATIVE NEGATIVE Final   Influenza B by PCR NEGATIVE NEGATIVE Final    Comment: (NOTE) The Xpert Xpress SARS-CoV-2/FLU/RSV plus assay is intended as an aid in the diagnosis of influenza from Nasopharyngeal swab specimens and should not be used as a sole basis for treatment. Nasal washings and aspirates are unacceptable for Xpert Xpress SARS-CoV-2/FLU/RSV testing.  Fact Sheet for Patients: EntrepreneurPulse.com.au  Fact Sheet for Healthcare Providers: IncredibleEmployment.be  This test is not yet approved or cleared by the [place] FDA and has been authorized for detection and/or diagnosis of SARS-CoV-2 by FDA under  an Emergency Use Authorization (EUA). This EUA will remain in effect (meaning this test can be used) for the duration of the COVID-19 declaration under Section 564(b)(1) of the Act, 21 U.S.C. section 360bbb-3(b)(1), unless the authorization is terminated or revoked.  Performed at [facility], [address] [place], [place] [address]   Aerobic/Anaerobic Culture w Gram Stain (surgical/deep wound)     Status: Abnormal   Collection Time: 01/25/21 11:39 AM   Specimen: BILE  Result Value Ref Range Status   Specimen Description   Final    BILE Performed at [facility], [address] [place], [place] [address]    Special Requests   Final    NONE Performed at [facility], [place], [place] [address]    Gram Stain   Final    NO SQUAMOUS EPITHELIAL CELLS SEEN FEW NO WBC SEEN FEW GRAM POSITIVE RODS MODERATE GRAM POSITIVE COCCI    Culture (A)  Final    MULTIPLE ORGANISMS PRESENT, NONE PREDOMINANT NO STAPHYLOCOCCUS AUREUS ISOLATED NO GROUP A STREP (S.PYOGENES) ISOLATED NO ANAEROBES ISOLATED Performed at [facility], [place] [address], [place], [place] [address]    Report Status 01/30/2021 FINAL  Final         Radiology Studies: No results found.      Scheduled Meds:  carvedilol  6.25 mg Oral BID WC   Chlorhexidine Gluconate Cloth  6 each Topical Q0600   [START ON 01/31/2021] diltiazem  360 mg Oral Daily   diltiazem  90 mg Oral Q6H   heparin  5,000 Units Subcutaneous Q8H   insulin aspart  0-5 Units Subcutaneous QHS   insulin aspart  0-9 Units Subcutaneous TID WC   isosorbide mononitrate  30 mg Oral QPM   multivitamin with minerals  1 tablet Oral Daily   pantoprazole  40 mg Oral Daily   sodium bicarbonate  650 mg Oral TID   sodium chloride flush  5 mL Intracatheter Q8H   vitamin B-12  1,000 mcg Oral Daily   Continuous Infusions:  piperacillin-tazobactam (ZOSYN)  IV 2.25 g (01/30/21 1407)     LOS: 6 days     Time spent: 25 minutes    Val Riles, MD [facility]   If 7PM-7AM, please contact night-coverage  01/30/2021, 4:48 PM

## 2021-01-31 ENCOUNTER — Telehealth: Payer: Self-pay | Admitting: Surgery

## 2021-01-31 DIAGNOSIS — K8010 Calculus of gallbladder with chronic cholecystitis without obstruction: Secondary | ICD-10-CM | POA: Diagnosis not present

## 2021-01-31 LAB — CBC
HCT: 28 % — ABNORMAL LOW (ref 39.0–52.0)
Hemoglobin: 9.1 g/dL — ABNORMAL LOW (ref 13.0–17.0)
MCH: 27.7 pg (ref 26.0–34.0)
MCHC: 32.5 g/dL (ref 30.0–36.0)
MCV: 85.1 fL (ref 80.0–100.0)
Platelets: 178 10*3/uL (ref 150–400)
RBC: 3.29 MIL/uL — ABNORMAL LOW (ref 4.22–5.81)
RDW: 14.3 % (ref 11.5–15.5)
WBC: 5.1 10*3/uL (ref 4.0–10.5)
nRBC: 0 % (ref 0.0–0.2)

## 2021-01-31 LAB — PHOSPHORUS: Phosphorus: 4.3 mg/dL (ref 2.5–4.6)

## 2021-01-31 LAB — BASIC METABOLIC PANEL
Anion gap: 12 (ref 5–15)
BUN: 35 mg/dL — ABNORMAL HIGH (ref 8–23)
CO2: 25 mmol/L (ref 22–32)
Calcium: 7.9 mg/dL — ABNORMAL LOW (ref 8.9–10.3)
Chloride: 101 mmol/L (ref 98–111)
Creatinine, Ser: 4.61 mg/dL — ABNORMAL HIGH (ref 0.61–1.24)
GFR, Estimated: 12 mL/min — ABNORMAL LOW (ref >60)
Glucose, Bld: 136 mg/dL — ABNORMAL HIGH (ref 70–99)
Potassium: 3.3 mmol/L — ABNORMAL LOW (ref 3.5–5.1)
Sodium: 138 mmol/L (ref 135–145)

## 2021-01-31 LAB — GLUCOSE, CAPILLARY
Glucose-Capillary: 151 mg/dL — ABNORMAL HIGH (ref 70–99)
Glucose-Capillary: 125 mg/dL — ABNORMAL HIGH (ref 70–99)

## 2021-01-31 LAB — MAGNESIUM: Magnesium: 1.9 mg/dL (ref 1.7–2.4)

## 2021-01-31 MED ORDER — ASCORBIC ACID 500 MG PO TABS: 500.0000 mg | ORAL_TABLET | Freq: Every day | ORAL | 0 refills | Status: DC

## 2021-01-31 MED ORDER — METRONIDAZOLE 500 MG PO TABS: 500.0000 mg | ORAL_TABLET | Freq: Two times a day (BID) | ORAL | 0 refills | Status: AC

## 2021-01-31 MED ORDER — CIPROFLOXACIN HCL 500 MG PO TABS: 500.0000 mg | ORAL_TABLET | Freq: Every day | ORAL | 0 refills | Status: AC

## 2021-01-31 MED ORDER — VITAMIN D (ERGOCALCIFEROL) 1.25 MG (50000 UNIT) PO CAPS
50000.0000 [IU] | ORAL_CAPSULE | ORAL | Status: DC
Start: 2021-01-31 — End: 2021-01-31
  Administered 2021-01-31: 50000 [IU] via ORAL
  Filled 2021-01-31: qty 1

## 2021-01-31 MED ORDER — DILTIAZEM HCL ER COATED BEADS 360 MG PO CP24: 360.0000 mg | ORAL_CAPSULE | Freq: Every day | ORAL | 0 refills | Status: DC

## 2021-01-31 MED ORDER — APIXABAN 2.5 MG PO TABS: 2.5000 mg | ORAL_TABLET | Freq: Two times a day (BID) | ORAL | 3 refills | Status: DC

## 2021-01-31 MED ORDER — POLYSACCHARIDE IRON COMPLEX 150 MG PO CAPS
150.0000 mg | ORAL_CAPSULE | Freq: Every day | ORAL | Status: DC
  Administered 2021-01-31: 150 mg via ORAL
  Filled 2021-01-31: qty 1

## 2021-01-31 MED ORDER — ASCORBIC ACID 500 MG PO TABS
500.0000 mg | ORAL_TABLET | Freq: Every day | ORAL | Status: DC
  Administered 2021-01-31: 500 mg via ORAL
  Filled 2021-01-31: qty 1

## 2021-01-31 MED ORDER — CARVEDILOL 6.25 MG PO TABS: 6.2500 mg | ORAL_TABLET | Freq: Two times a day (BID) | ORAL | 0 refills | Status: DC

## 2021-01-31 MED ORDER — VITAMIN D (ERGOCALCIFEROL) 1.25 MG (50000 UNIT) PO CAPS: 50000.0000 [IU] | ORAL_CAPSULE | ORAL | 0 refills | Status: AC

## 2021-01-31 NOTE — Telephone Encounter (Signed)
Patient needs to be schedule with radiobiology for a follow up cholecystostomy tube placement and then schedule an appt with Dr. Christian Mate. Please call patient and advise.

## 2021-01-31 NOTE — Progress Notes (Signed)
[facility]  ROUNDING NOTE   Subjective:   Karriem Muench is a 80 year old male with past medical history of B12 deficiency, hypertension, hyperlipidemia, and CKD.  Patient presents to the emergency room with complaints of weakness.  Admitted to observation status for Lactic acid increased [E87.20] Weakness [R53.1] RUQ pain [R10.11] Cholecystitis [K81.9]  Patient is known to our clinic from previous admissions.  He is a patient of Dr. Holley Raring, though has not been seen since April 2021.    Patient seen resting in bed Tolerating meals Denies shortness of breath Dialysis yesterday in chair, tolerated well   Objective:  Vital signs in last 24 hours:  Temp:  [97.9 F (36.6 C)-99.6 F (37.6 C)] 98.2 F (36.8 C) (10/27 1112) Pulse Rate:  [77-98] 81 (10/27 1112) Resp:  [17-18] 17 (10/27 1112) BP: (124-152)/(69-92) 132/69 (10/27 1112) SpO2:  [95 %-96 %] 95 % (10/27 1112)  Weight change: -2.3 kg Filed Weights   01/29/21 1230 01/30/21 0804 01/30/21 1130  Weight: 102.4 kg 103.9 kg 102.2 kg    Intake/Output: I/O last 3 completed shifts: In: 1095 [P.O.:1080; Other:15] Out: 2314 [Urine:575; Drains:525; Other:1214]   Intake/Output this shift:  Total I/O In: 600 [P.O.:600] Out: 420 [Urine:320; Drains:100]  Physical Exam: General: NAD, laying in bed  Head: Normocephalic, atraumatic. Moist oral mucosal membranes  Eyes: Anicteric  Lungs:  Clear to auscultation, normal effort  Heart: Irregular rhythm and rate  Abdomen:  Soft, nontender  Extremities: trace peripheral edema.  Neurologic: Nonfocal, moving all four extremities  Skin: No lesions  Access Rt IJ Permcath    Basic Metabolic Panel: Recent Labs  Lab 01/25/21 0535 01/26/21 0945 01/28/21 0448 01/29/21 0603 01/30/21 0355 01/31/21 0510  NA 139 139 140 140 139 138  K 4.1 4.3 3.7 3.6 3.4* 3.3*  CL 110 108 106 108 106 101  CO2 18* 17* 17* 21* 22 25  GLUCOSE 127* 205* 117* 109* 144* 136*  BUN 99* 109* 88*  79* 55* 35*  CREATININE 8.37* 8.58* 8.50* 6.44* 5.25* 4.61*  CALCIUM 8.2* 8.2* 7.6* 8.1* 8.0* 7.9*  MG  --   --   --   --  2.1 1.9  PHOS 6.7*  --   --   --  4.5 4.3     Liver Function Tests: Recent Labs  Lab 01/25/21 0535  AST 146*  ALT 131*  ALKPHOS 28*  BILITOT 0.4  PROT 5.3*  ALBUMIN 2.2*  2.4*    No results for input(s): LIPASE, AMYLASE in the last 168 hours.  No results for input(s): AMMONIA in the last 168 hours.  CBC: Recent Labs  Lab 01/25/21 0535 01/26/21 0945 01/31/21 0510  WBC 4.1 6.4 5.1  NEUTROABS 3.2 5.4  --   HGB 9.1* 9.8* 9.1*  HCT 27.9* 31.8* 28.0*  MCV 86.9 90.3 85.1  PLT 148* 182 178     Cardiac Enzymes: No results for input(s): CKTOTAL, CKMB, CKMBINDEX, TROPONINI in the last 168 hours.  BNP: Invalid input(s): POCBNP  CBG: Recent Labs  Lab 01/30/21 1208 01/30/21 1641 01/30/21 2016 01/31/21 0757 01/31/21 1113  GLUCAP 112* 178* 233* 151* 125*     Microbiology: Results for orders placed or performed during the hospital encounter of 01/23/21  Blood culture (routine x 2)     Status: Abnormal   Collection Time: 01/23/21  1:12 PM   Specimen: BLOOD  Result Value Ref Range Status   Specimen Description   Final    BLOOD RIGHT ANTECUBITAL Performed at [facility]  Lab, [place], [place] [address]    Special Requests   Final    BOTTLES DRAWN AEROBIC AND ANAEROBIC Blood Culture results may not be optimal due to an inadequate volume of blood received in culture bottles Performed at [facility], [address] [place], [place] [address]    Culture  Setup Time   Final    GRAM NEGATIVE RODS IN BOTH AEROBIC AND ANAEROBIC BOTTLES CRITICAL RESULT CALLED TO, READ BACK BY AND VERIFIED WITH: NATHAN BLUE@0422  01/24/21 RH Performed at [facility], [address] [place], [place] [address]    Culture KLEBSIELLA OXYTOCA (A)  Final   Report Status 01/26/2021 FINAL  Final   Organism ID, Bacteria  KLEBSIELLA OXYTOCA  Final      Susceptibility   Klebsiella oxytoca - MIC*    AMPICILLIN >=32 RESISTANT Resistant     CEFAZOLIN 8 SENSITIVE Sensitive     CEFEPIME <=0.12 SENSITIVE Sensitive     CEFTAZIDIME <=1 SENSITIVE Sensitive     CEFTRIAXONE <=0.25 SENSITIVE Sensitive     CIPROFLOXACIN <=0.25 SENSITIVE Sensitive     GENTAMICIN <=1 SENSITIVE Sensitive     IMIPENEM <=0.25 SENSITIVE Sensitive     TRIMETH/SULFA <=20 SENSITIVE Sensitive     AMPICILLIN/SULBACTAM 8 SENSITIVE Sensitive     PIP/TAZO <=4 SENSITIVE Sensitive     * KLEBSIELLA OXYTOCA  Blood Culture ID Panel (Reflexed)     Status: Abnormal   Collection Time: 01/23/21  1:12 PM  Result Value Ref Range Status   Enterococcus faecalis NOT DETECTED NOT DETECTED Final   Enterococcus Faecium NOT DETECTED NOT DETECTED Final   Listeria monocytogenes NOT DETECTED NOT DETECTED Final   Staphylococcus species NOT DETECTED NOT DETECTED Final   Staphylococcus aureus (BCID) NOT DETECTED NOT DETECTED Final   Staphylococcus epidermidis NOT DETECTED NOT DETECTED Final   Staphylococcus lugdunensis NOT DETECTED NOT DETECTED Final   Streptococcus species NOT DETECTED NOT DETECTED Final   Streptococcus agalactiae NOT DETECTED NOT DETECTED Final   Streptococcus pneumoniae NOT DETECTED NOT DETECTED Final   Streptococcus pyogenes NOT DETECTED NOT DETECTED Final   A.calcoaceticus-baumannii NOT DETECTED NOT DETECTED Final   Bacteroides fragilis NOT DETECTED NOT DETECTED Final   Enterobacterales DETECTED (A) NOT DETECTED Final    Comment: Enterobacterales represent a large order of gram negative bacteria, not a single organism. CRITICAL RESULT CALLED TO, READ BACK BY AND VERIFIED WITH: NATHAN BLUE@0422  01/24/21 RH    Enterobacter cloacae complex NOT DETECTED NOT DETECTED Final   Escherichia coli NOT DETECTED NOT DETECTED Final   Klebsiella aerogenes NOT DETECTED NOT DETECTED Final   Klebsiella oxytoca DETECTED (A) NOT DETECTED Final    Comment:  CRITICAL RESULT CALLED TO, READ BACK BY AND VERIFIED WITH: NATHAN BLUE@0422  01/24/21 RH    Klebsiella pneumoniae NOT DETECTED NOT DETECTED Final   Proteus species NOT DETECTED NOT DETECTED Final   Salmonella species NOT DETECTED NOT DETECTED Final   Serratia marcescens NOT DETECTED NOT DETECTED Final   Haemophilus influenzae NOT DETECTED NOT DETECTED Final   Neisseria meningitidis NOT DETECTED NOT DETECTED Final   Pseudomonas aeruginosa NOT DETECTED NOT DETECTED Final   Stenotrophomonas maltophilia NOT DETECTED NOT DETECTED Final   Candida albicans NOT DETECTED NOT DETECTED Final   Candida auris NOT DETECTED NOT DETECTED Final   Candida glabrata NOT DETECTED NOT DETECTED Final   Candida krusei NOT DETECTED NOT DETECTED Final   Candida parapsilosis NOT DETECTED NOT DETECTED Final   Candida tropicalis NOT DETECTED NOT DETECTED  Final   Cryptococcus neoformans/gattii NOT DETECTED NOT DETECTED Final   CTX-M ESBL NOT DETECTED NOT DETECTED Final   Carbapenem resistance IMP NOT DETECTED NOT DETECTED Final   Carbapenem resistance KPC NOT DETECTED NOT DETECTED Final   Carbapenem resistance NDM NOT DETECTED NOT DETECTED Final   Carbapenem resist OXA 48 LIKE NOT DETECTED NOT DETECTED Final   Carbapenem resistance VIM NOT DETECTED NOT DETECTED Final    Comment: Performed at [facility], [address] [place], [place] [address]  Blood culture (routine x 2)     Status: Abnormal   Collection Time: 01/23/21  1:17 PM   Specimen: BLOOD  Result Value Ref Range Status   Specimen Description   Final    BLOOD BLOOD RIGHT FOREARM Performed at [facility], [address], [place], [place] [address]    Special Requests   Final    BOTTLES DRAWN AEROBIC AND ANAEROBIC Blood Culture results may not be optimal due to an inadequate volume of blood received in culture bottles Performed at [facility], [address] [place], [place] [address]    Culture  Setup Time   Final     GRAM NEGATIVE RODS IN BOTH AEROBIC AND ANAEROBIC BOTTLES CRITICAL VALUE NOTED.  VALUE IS CONSISTENT WITH PREVIOUSLY REPORTED AND CALLED VALUE. Performed at [facility], [address] [place], [place] [address]    Culture (A)  Final    KLEBSIELLA OXYTOCA SUSCEPTIBILITIES PERFORMED ON PREVIOUS CULTURE WITHIN THE LAST 5 DAYS. Performed at [facility], [place] [address], [place],  [address]    Report Status 01/28/2021 FINAL  Final  Resp Panel by RT-PCR (Flu A&B, Covid) Nasopharyngeal Swab     Status: None   Collection Time: 01/23/21  4:49 PM   Specimen: Nasopharyngeal Swab; Nasopharyngeal(NP) swabs in vial transport medium  Result Value Ref Range Status   SARS Coronavirus 2 by RT PCR NEGATIVE NEGATIVE Final    Comment: (NOTE) SARS-CoV-2 target nucleic acids are NOT DETECTED.  The SARS-CoV-2 RNA is generally detectable in upper respiratory specimens during the acute phase of infection. The lowest concentration of SARS-CoV-2 viral copies this assay can detect is 138 copies/mL. A negative result does not preclude SARS-Cov-2 infection and should not be used as the sole basis for treatment or other patient management decisions. A negative result may occur with  improper specimen collection/handling, submission of specimen other than nasopharyngeal swab, presence of viral mutation(s) within the areas targeted by this assay, and inadequate number of viral copies(<138 copies/mL). A negative result must be combined with clinical observations, patient history, and epidemiological information. The expected result is Negative.  Fact Sheet for Patients:  EntrepreneurPulse.com.au  Fact Sheet for Healthcare Providers:  IncredibleEmployment.be  This test is no t yet approved or cleared by the [place] FDA and  has been authorized for detection and/or diagnosis of SARS-CoV-2 by FDA under an Emergency Use Authorization (EUA). This EUA  will remain  in effect (meaning this test can be used) for the duration of the COVID-19 declaration under Section 564(b)(1) of the Act, 21 U.S.C.section 360bbb-3(b)(1), unless the authorization is terminated  or revoked sooner.       Influenza A by PCR NEGATIVE NEGATIVE Final   Influenza B by PCR NEGATIVE NEGATIVE Final    Comment: (NOTE) The Xpert Xpress SARS-CoV-2/FLU/RSV plus assay is intended as an aid in the diagnosis of influenza from Nasopharyngeal swab specimens and should not be used as a sole basis for treatment.  Nasal washings and aspirates are unacceptable for Xpert Xpress SARS-CoV-2/FLU/RSV testing.  Fact Sheet for Patients: EntrepreneurPulse.com.au  Fact Sheet for Healthcare Providers: IncredibleEmployment.be  This test is not yet approved or cleared by the [place] FDA and has been authorized for detection and/or diagnosis of SARS-CoV-2 by FDA under an Emergency Use Authorization (EUA). This EUA will remain in effect (meaning this test can be used) for the duration of the COVID-19 declaration under Section 564(b)(1) of the Act, 21 U.S.C. section 360bbb-3(b)(1), unless the authorization is terminated or revoked.  Performed at [facility], [address] [place], [place] [address]   Aerobic/Anaerobic Culture w Gram Stain (surgical/deep wound)     Status: Abnormal   Collection Time: 01/25/21 11:39 AM   Specimen: BILE  Result Value Ref Range Status   Specimen Description   Final    BILE Performed at [facility], [address], [place], [place] [address]    Special Requests   Final    NONE Performed at [facility], [place], [place] [address]    Gram Stain   Final    NO SQUAMOUS EPITHELIAL CELLS SEEN FEW NO WBC SEEN FEW GRAM POSITIVE RODS MODERATE GRAM POSITIVE COCCI    Culture (A)  Final    MULTIPLE ORGANISMS PRESENT, NONE PREDOMINANT NO STAPHYLOCOCCUS AUREUS  ISOLATED NO GROUP A STREP (S.PYOGENES) ISOLATED NO ANAEROBES ISOLATED Performed at [facility], [place] [address] [place], [place] [address]    Report Status 01/30/2021 FINAL  Final    Coagulation Studies: No results for input(s): LABPROT, INR in the last 72 hours.   Urinalysis: No results for input(s): COLORURINE, LABSPEC, PHURINE, GLUCOSEU, HGBUR, BILIRUBINUR, KETONESUR, PROTEINUR, UROBILINOGEN, NITRITE, LEUKOCYTESUR in the last 72 hours.  Invalid input(s): APPERANCEUR    Imaging: No results found.   Medications:    piperacillin-tazobactam (ZOSYN)  IV 2.25 g (01/31/21 0520)    apixaban  2.5 mg Oral BID   vitamin C  500 mg Oral Daily   carvedilol  6.25 mg Oral BID WC   Chlorhexidine Gluconate Cloth  6 each Topical Q0600   diltiazem  360 mg Oral Daily   insulin aspart  0-5 Units Subcutaneous QHS   insulin aspart  0-9 Units Subcutaneous TID WC   iron polysaccharides  150 mg Oral Daily   isosorbide mononitrate  30 mg Oral QPM   multivitamin with minerals  1 tablet Oral Daily   pantoprazole  40 mg Oral Daily   sodium bicarbonate  650 mg Oral TID   sodium chloride flush  5 mL Intracatheter Q8H   Vitamin D (Ergocalciferol)  50,000 Units Oral Q7 days   HYDROmorphone (DILAUDID) injection, metoprolol tartrate, ondansetron **OR** ondansetron (ZOFRAN) IV, ondansetron (ZOFRAN) IV, oxyCODONE  Assessment/ Plan:  Mr. Wm Sahagun is a 80 y.o.  male with past medical history of B12 deficiency, hypertension, hyperlipidemia, and CKD.  Patient presents to the emergency room with complaints of weakness.  Admitted to observation status for Lactic acid increased [E87.20] Weakness [R53.1] RUQ pain [R10.11] Cholecystitis [K81.9]   End stage renal disease requiring dialysis Abdominal ultrasound showed slightly echogenic right kidney. Based on lack of renal recovery with previous treatments, patient is considered end stage renal disease requiring hemodialysis. Discussed other  dialysis modalities with patient and he showed initial interest in peritoneal dialysis, he has since requested to continue in-center dialysis. This will be addressed again in the outpatient setting.   Dialysis initiated on 01/28/21.  Received dialysis yesterday,  seated in chair. Tolerated well  Next dialysis treatment scheduled for Friday.  Dialysis coordinator confirmed dialysis chair for [facility] on MWF schedule at 1145. Patient can begin treatment on Friday.   Lab Results  Component Value Date   CREATININE 4.61 (H) 01/31/2021   CREATININE 5.25 (H) 01/30/2021   CREATININE 6.44 (H) 01/29/2021    Intake/Output Summary (Last 24 hours) at 01/31/2021 1132 Last data filed at 01/31/2021 1114 Gross per 24 hour  Intake 1330 ml  Output 1000 ml  Net 330 ml    2. Anemia of chronic kidney disease Lab Results  Component Value Date   HGB 9.1 (L) 01/31/2021  Iron supplement twice daily outpatient Hgb at goal   3. Secondary Hyperparathyroidism:  Lab Results  Component Value Date   CALCIUM 7.9 (L) 01/31/2021   PHOS 4.3 01/31/2021  Calcium below target. Will consider calcium supplementation outpatient  4.  Hypertension with chronic kidney disease.  Home regimen includes carvedilol, hydralazine laying, and isosorbide.  Currently receiving carvedilol, and isosorbide. BP 132/69  5.  Acute cholecystitis.  Abdominal ultrasound shows gallbladder sludge and nonmobile stone in gallbladder neck. HIDA scan consistent with cystic duct obstruction. Percutaneous cholecystostomy tube placed on 01/25/21.  6.Atrial Fibrillation with RVR. Previously on Cardizem drip and transitioned to Carvedilol. Cardiology consulted due to uncontrolled heart rate during dialysis.  Recommendations include increased dosing of Cardizem, continuation of carvedilol, and low-dose Eliquis twice daily for stroke prevention.  They will complete 2D echo outpatient.   LOS: 7 Aqueelah Cotrell 10/27/202211:32 AM

## 2021-01-31 NOTE — Discharge Summary (Signed)
[facility] Discharge Summary   Patient: Gregory Dixon XNT:[id number]  PCP: Valera Castle, MD  Date of admission: 01/23/2021   Date of discharge:  01/31/2021     Discharge Diagnoses:  Principal Problem:   Cholelithiasis Active Problems:   Benign prostatic hyperplasia   Chronic kidney disease   Diabetic peripheral neuropathy associated with type 2 diabetes mellitus ([place])   Hyperlipidemia   Anemia associated with chronic renal failure   Weakness   Elevated LFTs   Transaminitis   Cholecystitis   Admitted From: Home Disposition:  Home   Recommendations for Outpatient Follow-up:  PCP: 1 wk F/u Nephro for HD F/u Surgery in 1-2 wks F/u IR for cholecystostomy tube follow-up F/u cardiology in 1 to 2 weeks for A. fib Follow up LABS/TEST:     Follow-up [place] Follow up in 4 week(s).   Why: follow up cholecystostomy tube placement Contact information: [address] [place] [address] [phone]         Ronny Bacon, MD Follow up in 4 week(s).   Specialty: General Surgery Why: followup cholecystostomy tube placement, after the appointment with radiology Contact information: [address] [place] [address] [phone]         Isaias Cowman, MD. Go to.   Specialty: Cardiology Contact information: [facility] [place] [address] [phone]         Valera Castle, MD Follow up in 1 week(s).   Specialty: Family Medicine Contact information: [place] [address] [phone]                Diet recommendation: Renal diet  Activity: The patient is advised to gradually reintroduce usual activities, as tolerated  Discharge Condition: stable  Code Status: Full code   History of present illness: As per the H and P dictated on admission Hospital Course:  80 year old male past medical  history for chronic kidney disease stage IV, B12 deficiency, hypertension and dyslipidemia who presented with weakness.  Reported 3-4 days of chills, difficulty ambulating, no chest pain, dyspnea or abdominal pain.   Abdominal ultrasound with sludge in the gallbladder, nonmobile stone at the gallbladder neck with slight increase wall thickness but negative sonographic Murphy sign.  Patient has been placed on intravenous antibiotics, surgery was consulted. Recommendations to get a HIDA scan.  HIDA scan positive.  Considering patient's medical comorbidities and high surgical risk and the recommendation was made for a percutaneous cholecystostomy.  This was placed 10/21.  Patient tolerated without issue.  Patient developed new onset atrial fibrillation with rapid ventricular response, placed on diltiazem drip.  Diltiazem drip will be weaned off and transition to p.o. Cardizem on 10/21   Patient also has deteriorating kidney function.  AKI on CKD stage IV.  Will likely need initiation of dialysis.  Initial plan was to place a peritoneal dialysis catheter intraoperatively when patient stable for cholecystectomy.  However this may be 2 months from now and patient's kidney function may deteriorate before that time.  Nephrology following and will discuss with patient regarding initiation of temporary hemodialysis.   Nephrology discussed with patient.  He is in agreement to start temporary hemodialysis.  PermCath placed Monday 10/24.  Successful HD Monday 10/24, Tuesday 10/25 and Wednesday 10/26    Assessment & Plan:   Principal Problem:   Cholelithiasis Active Problems:   Benign prostatic hyperplasia   Chronic kidney disease   Diabetic  peripheral neuropathy associated with type 2 diabetes mellitus (HCC)   Hyperlipidemia   Anemia associated with chronic renal failure   Weakness   Elevated LFTs   Transaminitis   Cholecystitis   # Cholecystitis, Positive HIDA scan General surgery consulted,  recommend percutaneous cholecystostomy Percutaneous cholecystostomy placed 10/21 S/p Zosyn, d/w pharmacy, recommended transition to Cipro and Flagyl instead of Augmentin due to susceptibility. General surgery signed off 10/22, recommended to follow as an outpatient.  Patient was advised to follow with IR and general surgery for further management as an outpatient.   # AKI on CKD stage V, Non-anion gap metabolic acidosis Deteriorating kidney function, patient needs dialysis, initially plan was for PD catheter placement during cholecystectomy, Kidney function may deteriorate before that time requiring initiation of temporary hemodialysis. Discussed with nephrology, PermCath placed 10/24, Started HD on 10/24 S/p sodium bicarb oral, received hemodialysis on 10/26.  Patient was cleared by nephrology and hemodialysis was arranged for outpatient.   New onset atrial fibrillation, Noted on 10/20, Initially good rate control, Has been intermittently rapid rate since initiation of HD. S/p Cardizem IV infusion, transition to Cardizem CD 120  10/26 still patient has A. fib with RVR, discontinued Cardizem CD and started Cardizem IR 60 every 6 hourly, Continue Coreg 6.25 twice daily. Cardiology consulted, Cardizem IR increased from 60-90, transition to Cardizem CD 360 mg p.o. daily, started Eliquis 2.5 mg p.o. twice daily started for stroke prophylaxis.  Risk and benefit explained to patient. Continue to follow cardiology as an outpatient   Essential hypertension, Imdur resumed, Coreg was increased, started Cardizem as above GERD on  PPI Chronic anemia/MGUS, No indication for transfusion, follow with oncologist as an outpatient  Body mass index is 26.04 kg/m.  Nutrition Problem: Inadequate oral intake Etiology: decreased appetite Nutrition Interventions: Interventions: Refer to RD note for recommendations     On the day of the discharge the patient's vitals were stable, and no other acute medical condition  were reported by patient. the patient was felt safe to be discharge at Home   Consultants:  General surgery Interventional radiology Nephrology Cardiology  Procedures:  Percutaneous cholecystostomy 10/21 PermCath placement 10/24  Discharge Exam: General: Appear in no distress, no Rash; Oral Mucosa Clear, moist. Cardiovascular: S1 and S2 Present, no Murmur, Respiratory: normal respiratory effort, Bilateral Air entry present and no Crackles, no wheezes Abdomen: Bowel Sound present, Soft and nno tenderness, no hernia Extremities: no Pedal edema, no calf tenderness Neurology: alert and oriented to time, place, and person affect appropriate.  Filed Weights   01/29/21 1230 01/30/21 0804 01/30/21 1130  Weight: 102.4 kg 103.9 kg 102.2 kg   Vitals:   01/31/21 0755 01/31/21 1112  BP: (!) 152/92 132/69  Pulse: 98 81  Resp: 17 17  Temp: 99.6 F (37.6 C) 98.2 F (36.8 C)  SpO2: 96% 95%    DISCHARGE MEDICATION: Allergies as of 01/31/2021       Reactions   Aspirin Other (See Comments)   GI bleed   Codeine Nausea Only, Nausea And Vomiting        Medication List     STOP taking these medications    hydrALAZINE 50 MG tablet Commonly known as: APRESOLINE   losartan 25 MG tablet Commonly known as: COZAAR   olmesartan 40 MG tablet Commonly known as: BENICAR   olmesartan-hydrochlorothiazide 40-25 MG tablet Commonly known as: BENICAR HCT   vitamin B-12 1000 MCG tablet Commonly known as: CYANOCOBALAMIN       TAKE these medications  apixaban 2.5 MG Tabs tablet Commonly known as: ELIQUIS Take 1 tablet (2.5 mg total) by mouth 2 (two) times daily.   ascorbic acid 500 MG tablet Commonly known as: VITAMIN C Take 1 tablet (500 mg total) by mouth daily. Start taking on: February 01, 2021   carvedilol 6.25 MG tablet Commonly known as: COREG Take 1 tablet (6.25 mg total) by mouth 2 (two) times daily with a meal. What changed:  medication strength how much to  take   ciprofloxacin 500 MG tablet Commonly known as: Cipro Take 1 tablet (500 mg total) by mouth daily with breakfast for 7 days.   diltiazem 360 MG 24 hr capsule Commonly known as: CARDIZEM CD Take 1 capsule (360 mg total) by mouth daily. Start taking on: February 01, 2021   fenofibrate 160 MG tablet Take 160 mg by mouth at bedtime.   ferrous sulfate 325 (65 FE) MG tablet Take 325 mg by mouth 2 (two) times daily with a meal.   insulin NPH-regular Human (70-30) 100 UNIT/ML injection Inject 55 Units into the skin 2 (two) times daily with a meal.   isosorbide mononitrate 30 MG 24 hr tablet Commonly known as: IMDUR Take 30 mg by mouth at bedtime.   metroNIDAZOLE 500 MG tablet Commonly known as: Flagyl Take 1 tablet (500 mg total) by mouth 2 (two) times daily for 7 days.   multivitamin capsule Take 1 capsule by mouth daily.   pantoprazole 40 MG tablet Commonly known as: Protonix Take 1 tablet (40 mg total) by mouth daily.   sodium chloride flush 0.9 % Soln Commonly known as: NS 5 mLs by Intracatheter route every 12 (twelve) hours.   terazosin 10 MG capsule Commonly known as: HYTRIN Take 10 mg by mouth at bedtime.   Vitamin D (Ergocalciferol) 1.25 MG (50000 UNIT) Caps capsule Commonly known as: DRISDOL Take 1 capsule (50,000 Units total) by mouth every 7 (seven) days. Start taking on: February 07, 2021       Allergies  Allergen Reactions   Aspirin Other (See Comments)    GI bleed   Codeine Nausea Only and Nausea And Vomiting   Discharge Instructions     Call MD for:  difficulty breathing, headache or visual disturbances   Complete by: As directed    Call MD for:  extreme fatigue   Complete by: As directed    Call MD for:  persistant dizziness or light-headedness   Complete by: As directed    Call MD for:  persistant nausea and vomiting   Complete by: As directed    Call MD for:  severe uncontrolled pain   Complete by: As directed    Call MD for:   temperature >100.4   Complete by: As directed    Diet - low sodium heart healthy   Complete by: As directed    Renal diet, low potassium and phosphorus.   Discharge instructions   Complete by: As directed    Follow-up with PCP in 1 week, continue to monitor BP and heart rate at home and follow with PCP to titrate medications accordingly. Follow with nephrology and continue hemodialysis as per schedule Follow with general surgery in 1 week Follow with cardiology in 1 wk   Increase activity slowly   Complete by: As directed    No wound care   Complete by: As directed        The results of significant diagnostics from this hospitalization (including imaging, microbiology, ancillary and laboratory) are listed below  for reference.    Significant Diagnostic Studies: CT ABDOMEN PELVIS WO CONTRAST  Result Date: 01/23/2021 CLINICAL DATA:  Abdominal pain, acute, nonlocalized. Generalized weakness. Hypotension. EXAM: CT ABDOMEN AND PELVIS WITHOUT CONTRAST TECHNIQUE: Multidetector CT imaging of the abdomen and pelvis was performed following the standard protocol without IV contrast. COMPARISON:  08/09/2012 FINDINGS: Lower chest: Linear scarring or atelectasis at the right lung base. Otherwise no active lower chest finding. Hepatobiliary: Liver appears normal without contrast. 1 cm calcified stone in the gallbladder neck without definite CT evidence of cholecystitis. Consider right upper quadrant ultrasound if concern persists regarding possible clinical cholecystitis. Pancreas: Normal Spleen: Normal Adrenals/Urinary Tract: Adrenal glands are normal. Multiple bilateral renal cysts. Nonobstructing 6 mm stone in the lower pole of the left kidney. No passing stone or hydronephrosis. No stone in the bladder. Stomach/Bowel: Stomach and small intestine are normal. No evidence of diverticulosis or diverticulitis. Vascular/Lymphatic: Aortic atherosclerosis. No aneurysm. IVC is normal. No retroperitoneal  adenopathy. Reproductive: Enlarged prostate. Other: Tiny amount of free fluid in the pelvic cul-de-sac. No free air. Musculoskeletal: Ordinary lower lumbar degenerative changes. IMPRESSION: 1 cm gallstone in the gallbladder neck. No CT evidence of cholecystitis. Consider right upper quadrant ultrasound if there is clinical concern regarding potential cholecystitis. Aortic Atherosclerosis (ICD10-I70.0). Multiple renal cysts. 6 mm nonobstructing stone in the lower pole of the left kidney. Enlarged prostate. Electronically Signed   By: Nelson Chimes M.D.   On: 01/23/2021 14:58   NM Hepatobiliary Liver Func  Result Date: 01/24/2021 CLINICAL DATA:  Abdominal pain.  Cholelithiasis EXAM: NUCLEAR MEDICINE HEPATOBILIARY IMAGING TECHNIQUE: Sequential images of the abdomen were obtained out to 60 minutes following intravenous administration of radiopharmaceutical. RADIOPHARMACEUTICALS:  5.6 mCi Tc-87m  Choletec IV COMPARISON:  CT 01/23/2021 FINDINGS: Prompt clearance of radiotracer from blood pool and homogeneous uptake in liver. Counts are evident in the small bowel by 50 minutes. At 90 minutes, IV morphine was administered. Gallbladder failed to contract after morphine augmentation. IMPRESSION: Non filling of the gall bladder is consistent with cystic duct obstruction (acute cholecystitis). These results will be called to the ordering clinician or representative by the Radiologist Assistant, and communication documented in the PACS or [employer]. Electronically Signed   By: Suzy Bouchard M.D.   On: 01/24/2021 15:59   PERIPHERAL VASCULAR CATHETERIZATION  Result Date: 01/28/2021 See surgical note for result.  IR Perc Cholecystostomy  Result Date: 01/25/2021 INDICATION: 80 year old with evidence of acute cholecystitis based on imaging. Request for a percutaneous cholecystostomy tube placement. EXAM: Percutaneous cholecystostomy tube placement with ultrasound and fluoroscopic guidance. MEDICATIONS:  Moderate sedation.  Patient is receiving scheduled IV antibiotics. ANESTHESIA/SEDATION: Moderate (conscious) sedation was employed during this procedure. A total of Versed 1.0mg  and fentanyl 50 mcg was administered intravenously at the order of the provider performing the procedure. Total intra-service moderate sedation time: 14 minutes. Patient's level of consciousness and vital signs were monitored continuously by radiology nurse throughout the procedure under the supervision of the provider performing the procedure. FLUOROSCOPY TIME:  Fluoroscopy Time: 12 seconds, 2.4 mGy CONTRAST:  3 mL Omnipaque 102 COMPLICATIONS: None immediate. PROCEDURE: Informed written consent was obtained from the patient after a thorough discussion of the procedural risks, benefits and alternatives. All questions were addressed. A timeout was performed prior to the initiation of the procedure. Ultrasound was used to identify the gallbladder. Ultrasound image was saved for documentation. The right side of the abdomen was prepped and draped in sterile fashion. Maximal barrier sterile technique was utilized including caps, mask, sterile  gowns, sterile gloves, sterile drape, hand hygiene and skin antiseptic. Skin was anesthetized using 1% lidocaine. Small incision was made. Using ultrasound guidance, an 18 gauge trocar needle was directed into the gallbladder using a transhepatic approach. A superstiff Amplatz wire was advanced to the gallbladder using ultrasound and fluoroscopic guidance. The tract was dilated to accommodate a 10 [place] multipurpose drain. Cloudy bilious fluid was aspirated from the gallbladder. The gallbladder was decompressed at the end of the procedure. Fluid sample sent for culture. Drain was secured to the skin with silk suture and attached to a gravity bag. Dressing was placed. FINDINGS: Gallbladder was mildly distended at the beginning of the procedure. Drain successfully placed in the gallbladder and the gallbladder  was decompressed at the end of the procedure. Fluoroscopy demonstrates a stone near the base of the gallbladder. IMPRESSION: Successful percutaneous cholecystostomy tube placement with ultrasound and fluoroscopic guidance. Electronically Signed   By: Markus Daft M.D.   On: 01/25/2021 13:06   DG Chest Port 1 View  Result Date: 01/23/2021 CLINICAL DATA:  Weakness Increase lactic acid EXAM: PORTABLE CHEST 1 VIEW COMPARISON:  None. FINDINGS: Heart size within normal limits. No pulmonary vascular congestion. Right hemidiaphragm is elevated. Lungs are clear. IMPRESSION: No acute cardiopulmonary process. Electronically Signed   By: Miachel Roux M.D.   On: 01/23/2021 16:32   US Abdomen Limited RUQ (LIVER/GB)  Result Date: 01/23/2021 CLINICAL DATA:  Right upper quadrant pain EXAM: ULTRASOUND ABDOMEN LIMITED RIGHT UPPER QUADRANT COMPARISON:  CT 01/23/2021 FINDINGS: Gallbladder: Sludge within the gallbladder. 16 mm non mobile stone at the gallbladder neck. Slight increased wall thickness of 3.7 mm but negative sonographic Murphy. Common bile duct: Diameter: 5.2 mm Liver: No focal lesion identified. Within normal limits in parenchymal echogenicity. Portal vein is patent on color Doppler imaging with normal direction of blood flow towards the liver. Other: Right kidney cortex appears slightly echogenic. IMPRESSION: 1. Sludge in the gallbladder with non mobile stone at the gallbladder neck with slight increased wall thickness but negative sonographic Murphy. Findings are indeterminate for cholecystitis, consider correlation with nuclear medicine hepatobiliary imaging 2. Right kidney cortex appears slightly echogenic suggesting medical renal disease, correlate with appropriate laboratory values Electronically Signed   By: Donavan Foil M.D.   On: 01/23/2021 15:45    Microbiology: Recent Results (from the past 240 hour(s))  Blood culture (routine x 2)     Status: Abnormal   Collection Time: 01/23/21  1:12 PM    Specimen: BLOOD  Result Value Ref Range Status   Specimen Description   Final    BLOOD RIGHT ANTECUBITAL Performed at [facility], [address] [place], [place] [address]    Special Requests   Final    BOTTLES DRAWN AEROBIC AND ANAEROBIC Blood Culture results may not be optimal due to an inadequate volume of blood received in culture bottles Performed at [facility], [address] [place], [place] [address]    Culture  Setup Time   Final    GRAM NEGATIVE RODS IN BOTH AEROBIC AND ANAEROBIC BOTTLES CRITICAL RESULT CALLED TO, READ BACK BY AND VERIFIED WITH: NATHAN BLUE@[email or web address]  01/24/21 RH Performed at [facility], [address] [place], [place] [address]    Culture KLEBSIELLA OXYTOCA (A)  Final   Report Status 01/26/2021 FINAL  Final   Organism ID, Bacteria KLEBSIELLA OXYTOCA  Final      Susceptibility   Klebsiella oxytoca - MIC*    AMPICILLIN >=32 RESISTANT Resistant  CEFAZOLIN 8 SENSITIVE Sensitive     CEFEPIME <=0.12 SENSITIVE Sensitive     CEFTAZIDIME <=1 SENSITIVE Sensitive     CEFTRIAXONE <=0.25 SENSITIVE Sensitive     CIPROFLOXACIN <=0.25 SENSITIVE Sensitive     GENTAMICIN <=1 SENSITIVE Sensitive     IMIPENEM <=0.25 SENSITIVE Sensitive     TRIMETH/SULFA <=20 SENSITIVE Sensitive     AMPICILLIN/SULBACTAM 8 SENSITIVE Sensitive     PIP/TAZO <=4 SENSITIVE Sensitive     * KLEBSIELLA OXYTOCA  Blood Culture ID Panel (Reflexed)     Status: Abnormal   Collection Time: 01/23/21  1:12 PM  Result Value Ref Range Status   Enterococcus faecalis NOT DETECTED NOT DETECTED Final   Enterococcus Faecium NOT DETECTED NOT DETECTED Final   Listeria monocytogenes NOT DETECTED NOT DETECTED Final   Staphylococcus species NOT DETECTED NOT DETECTED Final   Staphylococcus aureus (BCID) NOT DETECTED NOT DETECTED Final   Staphylococcus epidermidis NOT DETECTED NOT DETECTED Final   Staphylococcus lugdunensis NOT DETECTED NOT DETECTED Final   Streptococcus  species NOT DETECTED NOT DETECTED Final   Streptococcus agalactiae NOT DETECTED NOT DETECTED Final   Streptococcus pneumoniae NOT DETECTED NOT DETECTED Final   Streptococcus pyogenes NOT DETECTED NOT DETECTED Final   A.calcoaceticus-baumannii NOT DETECTED NOT DETECTED Final   Bacteroides fragilis NOT DETECTED NOT DETECTED Final   Enterobacterales DETECTED (A) NOT DETECTED Final    Comment: Enterobacterales represent a large order of gram negative bacteria, not a single organism. CRITICAL RESULT CALLED TO, READ BACK BY AND VERIFIED WITH: NATHAN BLUE@0422  01/24/21 RH    Enterobacter cloacae complex NOT DETECTED NOT DETECTED Final   Escherichia coli NOT DETECTED NOT DETECTED Final   Klebsiella aerogenes NOT DETECTED NOT DETECTED Final   Klebsiella oxytoca DETECTED (A) NOT DETECTED Final    Comment: CRITICAL RESULT CALLED TO, READ BACK BY AND VERIFIED WITH: NATHAN BLUE@0422  01/24/21 RH    Klebsiella pneumoniae NOT DETECTED NOT DETECTED Final   Proteus species NOT DETECTED NOT DETECTED Final   Salmonella species NOT DETECTED NOT DETECTED Final   Serratia marcescens NOT DETECTED NOT DETECTED Final   Haemophilus influenzae NOT DETECTED NOT DETECTED Final   Neisseria meningitidis NOT DETECTED NOT DETECTED Final   Pseudomonas aeruginosa NOT DETECTED NOT DETECTED Final   Stenotrophomonas maltophilia NOT DETECTED NOT DETECTED Final   Candida albicans NOT DETECTED NOT DETECTED Final   Candida auris NOT DETECTED NOT DETECTED Final   Candida glabrata NOT DETECTED NOT DETECTED Final   Candida krusei NOT DETECTED NOT DETECTED Final   Candida parapsilosis NOT DETECTED NOT DETECTED Final   Candida tropicalis NOT DETECTED NOT DETECTED Final   Cryptococcus neoformans/gattii NOT DETECTED NOT DETECTED Final   CTX-M ESBL NOT DETECTED NOT DETECTED Final   Carbapenem resistance IMP NOT DETECTED NOT DETECTED Final   Carbapenem resistance KPC NOT DETECTED NOT DETECTED Final   Carbapenem resistance NDM NOT  DETECTED NOT DETECTED Final   Carbapenem resist OXA 48 LIKE NOT DETECTED NOT DETECTED Final   Carbapenem resistance VIM NOT DETECTED NOT DETECTED Final    Comment: Performed at [facility], [address] [place], [place] [address]  Blood culture (routine x 2)     Status: Abnormal   Collection Time: 01/23/21  1:17 PM   Specimen: BLOOD  Result Value Ref Range Status   Specimen Description   Final    BLOOD BLOOD RIGHT FOREARM Performed at [facility], [address], [place], [place] [address]    Special Requests   Final  BOTTLES DRAWN AEROBIC AND ANAEROBIC Blood Culture results may not be optimal due to an inadequate volume of blood received in culture bottles Performed at [facility], [address] [place], [place] [address]    Culture  Setup Time   Final    GRAM NEGATIVE RODS IN BOTH AEROBIC AND ANAEROBIC BOTTLES CRITICAL VALUE NOTED.  VALUE IS CONSISTENT WITH PREVIOUSLY REPORTED AND CALLED VALUE. Performed at [facility], [address] [place], [place] [address]    Culture (A)  Final    KLEBSIELLA OXYTOCA SUSCEPTIBILITIES PERFORMED ON PREVIOUS CULTURE WITHIN THE LAST 5 DAYS. Performed at [facility], [place] [address], [place],  [address]    Report Status 01/28/2021 FINAL  Final  Resp Panel by RT-PCR (Flu A&B, Covid) Nasopharyngeal Swab     Status: None   Collection Time: 01/23/21  4:49 PM   Specimen: Nasopharyngeal Swab; Nasopharyngeal(NP) swabs in vial transport medium  Result Value Ref Range Status   SARS Coronavirus 2 by RT PCR NEGATIVE NEGATIVE Final    Comment: (NOTE) SARS-CoV-2 target nucleic acids are NOT DETECTED.  The SARS-CoV-2 RNA is generally detectable in upper respiratory specimens during the acute phase of infection. The lowest concentration of SARS-CoV-2 viral copies this assay can detect is 138 copies/mL. A negative result does not preclude SARS-Cov-2 infection and should not be used as the sole  basis for treatment or other patient management decisions. A negative result may occur with  improper specimen collection/handling, submission of specimen other than nasopharyngeal swab, presence of viral mutation(s) within the areas targeted by this assay, and inadequate number of viral copies(<138 copies/mL). A negative result must be combined with clinical observations, patient history, and epidemiological information. The expected result is Negative.  Fact Sheet for Patients:  EntrepreneurPulse.com.au  Fact Sheet for Healthcare Providers:  IncredibleEmployment.be  This test is no t yet approved or cleared by the [place] FDA and  has been authorized for detection and/or diagnosis of SARS-CoV-2 by FDA under an Emergency Use Authorization (EUA). This EUA will remain  in effect (meaning this test can be used) for the duration of the COVID-19 declaration under Section 564(b)(1) of the Act, 21 U.S.C.section 360bbb-3(b)(1), unless the authorization is terminated  or revoked sooner.       Influenza A by PCR NEGATIVE NEGATIVE Final   Influenza B by PCR NEGATIVE NEGATIVE Final    Comment: (NOTE) The Xpert Xpress SARS-CoV-2/FLU/RSV plus assay is intended as an aid in the diagnosis of influenza from Nasopharyngeal swab specimens and should not be used as a sole basis for treatment. Nasal washings and aspirates are unacceptable for Xpert Xpress SARS-CoV-2/FLU/RSV testing.  Fact Sheet for Patients: EntrepreneurPulse.com.au  Fact Sheet for Healthcare Providers: IncredibleEmployment.be  This test is not yet approved or cleared by the [place] FDA and has been authorized for detection and/or diagnosis of SARS-CoV-2 by FDA under an Emergency Use Authorization (EUA). This EUA will remain in effect (meaning this test can be used) for the duration of the COVID-19 declaration under Section 564(b)(1) of the Act,  21 U.S.C. section 360bbb-3(b)(1), unless the authorization is terminated or revoked.  Performed at [facility], [address] [place],  [address]   Aerobic/Anaerobic Culture w Gram Stain (surgical/deep wound)     Status: Abnormal   Collection Time: 01/25/21 11:39 AM   Specimen: BILE  Result Value Ref Range Status   Specimen Description   Final    BILE Performed at [facility], [address] [place], [place] [address]    Special Requests   Final    NONE Performed at [facility], [address] [place], [place] [address]    Gram Stain   Final    NO SQUAMOUS EPITHELIAL CELLS SEEN FEW NO WBC SEEN FEW GRAM POSITIVE RODS MODERATE GRAM POSITIVE COCCI    Culture (A)  Final    MULTIPLE ORGANISMS PRESENT, NONE PREDOMINANT NO STAPHYLOCOCCUS AUREUS ISOLATED NO GROUP A STREP (S.PYOGENES) ISOLATED NO ANAEROBES ISOLATED Performed at [facility], [place] [address], [place], [place] [address]    Report Status 01/30/2021 FINAL  Final     Labs: CBC: Recent Labs  Lab 01/25/21 0535 01/26/21 0945 01/31/21 0510  WBC 4.1 6.4 5.1  NEUTROABS 3.2 5.4  --   HGB 9.1* 9.8* 9.1*  HCT 27.9* 31.8* 28.0*  MCV 86.9 90.3 85.1  PLT 148* 182 022   Basic Metabolic Panel: Recent Labs  Lab 01/25/21 0535 01/26/21 0945 01/28/21 0448 01/29/21 0603 01/30/21 0355 01/31/21 0510  NA 139 139 140 140 139 138  K 4.1 4.3 3.7 3.6 3.4* 3.3*  CL 110 108 106 108 106 101  CO2 18* 17* 17* 21* 22 25  GLUCOSE 127* 205* 117* 109* 144* 136*  BUN 99* 109* 88* 79* 55* 35*  CREATININE 8.37* 8.58* 8.50* 6.44* 5.25* 4.61*  CALCIUM 8.2* 8.2* 7.6* 8.1* 8.0* 7.9*  MG  --   --   --   --  2.1 1.9  PHOS 6.7*  --   --   --  4.5 4.3   Liver Function Tests: Recent Labs  Lab 01/25/21 0535  AST 146*  ALT 131*  ALKPHOS 28*  BILITOT 0.4  PROT 5.3*  ALBUMIN 2.2*  2.4*   No results for input(s): LIPASE, AMYLASE in the last 168 hours. No results for input(s): AMMONIA  in the last 168 hours. Cardiac Enzymes: No results for input(s): CKTOTAL, CKMB, CKMBINDEX, TROPONINI in the last 168 hours. BNP (last 3 results) No results for input(s): BNP in the last 8760 hours. CBG: Recent Labs  Lab 01/30/21 1208 01/30/21 1641 01/30/21 2016 01/31/21 0757 01/31/21 1113  GLUCAP 112* 178* 233* 151* 125*    Time spent: 35 minutes  Signed:  Val Riles  [facility]  01/31/2021 11:37 AM

## 2021-01-31 NOTE — Telephone Encounter (Signed)
Spoke with Gregory Dixon, patient just needs a follow up with Dr Christian Mate here in the office. We do not schedule for IR, that is something the hospital will need to do. We will call the patient tomorrow to schedule a follow up here for after his appointment with IR.

## 2021-01-31 NOTE — Progress Notes (Signed)
Patient is being discharge home today, discharged instruction provided, patient wife was showed how to flush billiary drain  with 5 cc saline flush twice a day with a return demonstration .

## 2021-01-31 NOTE — Progress Notes (Signed)
[facility]    SUBJECTIVE: The patient reports feeling "normal" this morning with no chest pain, shortness of breath or palpitations. He is eager to go home today.   Vitals:   01/30/21 1950 01/31/21 0052 01/31/21 0515 01/31/21 0755  BP: 124/69 136/72 (!) 146/83 (!) 152/92  Pulse: 77 95  98  Resp: 18 18  17   Temp: 97.9 F (36.6 C) 99 F (37.2 C) 98.7 F (37.1 C) 99.6 F (37.6 C)  TempSrc:  Oral Oral   SpO2: 96% 96% 95% 96%  Weight:      Height:         Intake/Output Summary (Last 24 hours) at 01/31/2021 0912 Last data filed at 01/31/2021 0825 Gross per 24 hour  Intake 970 ml  Output 1914 ml  Net -944 ml      PHYSICAL EXAM  General: Well developed, well nourished, in no acute distress HEENT:  Normocephalic and atramatic Neck:  No JVD.  Lungs: Clear bilaterally to auscultation and percussion. Heart: irr irr without gallops or murmurs.  Abdomen: nondistended Msk:  no obvious deformities Extremities: No clubbing, cyanosis or edema.   Neuro: Alert and oriented X 3. Psych:  Good affect, responds appropriately   LABS: Basic Metabolic Panel: Recent Labs    01/30/21 0355 01/31/21 0510  NA 139 138  K 3.4* 3.3*  CL 106 101  CO2 22 25  GLUCOSE 144* 136*  BUN 55* 35*  CREATININE 5.25* 4.61*  CALCIUM 8.0* 7.9*  MG 2.1 1.9  PHOS 4.5 4.3   Liver Function Tests: No results for input(s): AST, ALT, ALKPHOS, BILITOT, PROT, ALBUMIN in the last 72 hours. No results for input(s): LIPASE, AMYLASE in the last 72 hours. CBC: Recent Labs    01/31/21 0510  WBC 5.1  HGB 9.1*  HCT 28.0*  MCV 85.1  PLT 178   Cardiac Enzymes: No results for input(s): CKTOTAL, CKMB, CKMBINDEX, TROPONINI in the last 72 hours. BNP: Invalid input(s): POCBNP D-Dimer: No results for input(s): DDIMER in the last 72 hours. Hemoglobin A1C: No results for input(s): HGBA1C in the last 72 hours. Fasting Lipid Panel: No results for input(s): CHOL, HDL, LDLCALC, TRIG, CHOLHDL, LDLDIRECT in  the last 72 hours. Thyroid Function Tests: No results for input(s): TSH, T4TOTAL, T3FREE, THYROIDAB in the last 72 hours.  Invalid input(s): FREET3 Anemia Panel: Recent Labs    01/30/21 0355  FOLATE 11.4  TIBC 202*  IRON 25*    No results found.     ASSESSMENT AND PLAN:  Principal Problem:   Cholelithiasis Active Problems:   Benign prostatic hyperplasia   Chronic kidney disease   Diabetic peripheral neuropathy associated with type 2 diabetes mellitus (HCC)   Hyperlipidemia   Anemia associated with chronic renal failure   Weakness   Elevated LFTs   Transaminitis   Cholecystitis    New onset atrial fibrillation, in the setting of acute cholecystitis and AKI on CKD V with initiation of hemodialysis during this admission. The patient has been on diltiazem drip, which was transitioned to oral Cardizem and carvedilol. Patient has a chads vasc score of 4. His rates are better controlled at this time. AKI on CKD V, now on hemodialysis Acute cholecystitis, status post percutaneous cholecystostomy, on antibiotics  Plan: Continue low dose Eliquis BID for stroke prevention Continue Cardizem 360 mg daily Continue carvedilol 2D echocardiogram as outpatient Follow up with Dr. Saralyn Pilar in 1 week; appointment request has been made.  Clabe Seal, PA-C 01/31/2021 9:12 AM

## 2021-02-05 ENCOUNTER — Telehealth: Payer: Self-pay | Admitting: Oncology

## 2021-02-05 ENCOUNTER — Telehealth: Payer: Self-pay | Admitting: Surgery

## 2021-02-05 ENCOUNTER — Other Ambulatory Visit: Payer: Self-pay

## 2021-02-05 NOTE — Telephone Encounter (Signed)
Pt called to reschedule his appt. On 11-3. Please call back at [phone]

## 2021-02-05 NOTE — Telephone Encounter (Signed)
Called patient's pharmacy and the do have sodium chloride flushes available. These have been ordered for the patient. Call to the patient to inform of such. No answer and unable to leave a message.

## 2021-02-05 NOTE — Telephone Encounter (Signed)
Patient is calling and is asking if we can send a rx into the pharmacy for sodium chloride flush for his drain. Please call patient and advise.

## 2021-02-06 ENCOUNTER — Telehealth: Payer: Self-pay | Admitting: Nurse Practitioner

## 2021-02-06 ENCOUNTER — Other Ambulatory Visit: Payer: Self-pay | Admitting: Diagnostic Radiology

## 2021-02-06 DIAGNOSIS — K819 Cholecystitis, unspecified: Secondary | ICD-10-CM

## 2021-02-06 NOTE — Telephone Encounter (Signed)
Pt called and needs to reschedule appt. For 11-7. Please give wife a call at [phone]

## 2021-02-07 ENCOUNTER — Inpatient Hospital Stay: Payer: Medicare Other

## 2021-02-08 ENCOUNTER — Other Ambulatory Visit: Payer: Self-pay | Admitting: Nephrology

## 2021-02-08 ENCOUNTER — Telehealth: Payer: Self-pay

## 2021-02-08 NOTE — Telephone Encounter (Signed)
Message left for the patient to call back.  He needs to be scheduled for a hospital follow up for gallbladder with cholecystostomy drain with Dr Christian Mate for after December 8th.

## 2021-02-11 ENCOUNTER — Inpatient Hospital Stay: Payer: Medicare Other

## 2021-02-11 ENCOUNTER — Inpatient Hospital Stay: Payer: Medicare Other | Admitting: Nurse Practitioner

## 2021-02-13 ENCOUNTER — Inpatient Hospital Stay: Payer: Medicare Other | Attending: Nurse Practitioner

## 2021-02-13 ENCOUNTER — Telehealth: Payer: Self-pay

## 2021-02-13 ENCOUNTER — Other Ambulatory Visit: Payer: Self-pay

## 2021-02-13 ENCOUNTER — Emergency Department
Admission: EM | Admit: 2021-02-13 | Discharge: 2021-02-13 | Disposition: A | Discharge status: Home / Self Care | Payer: Medicare Other | Attending: Emergency Medicine | Admitting: Emergency Medicine

## 2021-02-13 ENCOUNTER — Encounter: Payer: Self-pay | Admitting: Oncology

## 2021-02-13 ENCOUNTER — Inpatient Hospital Stay (HOSPITAL_BASED_OUTPATIENT_CLINIC_OR_DEPARTMENT_OTHER): Payer: Medicare Other | Admitting: Oncology

## 2021-02-13 ENCOUNTER — Encounter: Payer: Self-pay | Admitting: Emergency Medicine

## 2021-02-13 VITALS — BP 110/89 | HR 103 | Temp 96.2°F | Resp 17 | Wt 224.0 lb

## 2021-02-13 DIAGNOSIS — Z992 Dependence on renal dialysis: Secondary | ICD-10-CM | POA: Insufficient documentation

## 2021-02-13 DIAGNOSIS — E1122 Type 2 diabetes mellitus with diabetic chronic kidney disease: Secondary | ICD-10-CM | POA: Insufficient documentation

## 2021-02-13 DIAGNOSIS — Z794 Long term (current) use of insulin: Secondary | ICD-10-CM | POA: Insufficient documentation

## 2021-02-13 DIAGNOSIS — Z87891 Personal history of nicotine dependence: Secondary | ICD-10-CM | POA: Diagnosis not present

## 2021-02-13 DIAGNOSIS — N186 End stage renal disease: Secondary | ICD-10-CM | POA: Insufficient documentation

## 2021-02-13 DIAGNOSIS — D631 Anemia in chronic kidney disease: Secondary | ICD-10-CM | POA: Insufficient documentation

## 2021-02-13 DIAGNOSIS — Z85828 Personal history of other malignant neoplasm of skin: Secondary | ICD-10-CM | POA: Insufficient documentation

## 2021-02-13 DIAGNOSIS — D500 Iron deficiency anemia secondary to blood loss (chronic): Secondary | ICD-10-CM | POA: Diagnosis not present

## 2021-02-13 DIAGNOSIS — D472 Monoclonal gammopathy: Secondary | ICD-10-CM | POA: Insufficient documentation

## 2021-02-13 DIAGNOSIS — N189 Chronic kidney disease, unspecified: Secondary | ICD-10-CM | POA: Insufficient documentation

## 2021-02-13 DIAGNOSIS — R531 Weakness: Secondary | ICD-10-CM

## 2021-02-13 DIAGNOSIS — R42 Dizziness and giddiness: Secondary | ICD-10-CM | POA: Insufficient documentation

## 2021-02-13 DIAGNOSIS — I120 Hypertensive chronic kidney disease with stage 5 chronic kidney disease or end stage renal disease: Secondary | ICD-10-CM | POA: Diagnosis not present

## 2021-02-13 DIAGNOSIS — K8010 Calculus of gallbladder with chronic cholecystitis without obstruction: Secondary | ICD-10-CM | POA: Insufficient documentation

## 2021-02-13 DIAGNOSIS — U071 COVID-19: Secondary | ICD-10-CM | POA: Insufficient documentation

## 2021-02-13 DIAGNOSIS — E611 Iron deficiency: Secondary | ICD-10-CM | POA: Insufficient documentation

## 2021-02-13 DIAGNOSIS — Z886 Allergy status to analgesic agent status: Secondary | ICD-10-CM | POA: Insufficient documentation

## 2021-02-13 DIAGNOSIS — Z79899 Other long term (current) drug therapy: Secondary | ICD-10-CM | POA: Insufficient documentation

## 2021-02-13 DIAGNOSIS — M47816 Spondylosis without myelopathy or radiculopathy, lumbar region: Secondary | ICD-10-CM | POA: Insufficient documentation

## 2021-02-13 DIAGNOSIS — Z7901 Long term (current) use of anticoagulants: Secondary | ICD-10-CM | POA: Insufficient documentation

## 2021-02-13 DIAGNOSIS — D539 Nutritional anemia, unspecified: Secondary | ICD-10-CM

## 2021-02-13 DIAGNOSIS — R5383 Other fatigue: Secondary | ICD-10-CM | POA: Insufficient documentation

## 2021-02-13 DIAGNOSIS — N281 Cyst of kidney, acquired: Secondary | ICD-10-CM | POA: Insufficient documentation

## 2021-02-13 DIAGNOSIS — I700 Atherosclerosis of aorta: Secondary | ICD-10-CM | POA: Insufficient documentation

## 2021-02-13 DIAGNOSIS — N200 Calculus of kidney: Secondary | ICD-10-CM | POA: Insufficient documentation

## 2021-02-13 DIAGNOSIS — Z885 Allergy status to narcotic agent status: Secondary | ICD-10-CM | POA: Insufficient documentation

## 2021-02-13 DIAGNOSIS — N400 Enlarged prostate without lower urinary tract symptoms: Secondary | ICD-10-CM | POA: Insufficient documentation

## 2021-02-13 LAB — COMPREHENSIVE METABOLIC PANEL
ALT: 11 U/L (ref 0–44)
ALT: 11 U/L (ref 0–44)
AST: 18 U/L (ref 15–41)
AST: 21 U/L (ref 15–41)
Albumin: 2.8 g/dL — ABNORMAL LOW (ref 3.5–5.0)
Albumin: 2.4 g/dL — ABNORMAL LOW (ref 3.5–5.0)
Alkaline Phosphatase: 36 U/L — ABNORMAL LOW (ref 38–126)
Alkaline Phosphatase: 33 U/L — ABNORMAL LOW (ref 38–126)
Anion gap: 13 (ref 5–15)
Anion gap: 11 (ref 5–15)
BUN: 40 mg/dL — ABNORMAL HIGH (ref 8–23)
BUN: 40 mg/dL — ABNORMAL HIGH (ref 8–23)
CO2: 24 mmol/L (ref 22–32)
CO2: 22 mmol/L (ref 22–32)
Calcium: 8.3 mg/dL — ABNORMAL LOW (ref 8.9–10.3)
Calcium: 8.3 mg/dL — ABNORMAL LOW (ref 8.9–10.3)
Chloride: 99 mmol/L (ref 98–111)
Chloride: 105 mmol/L (ref 98–111)
Creatinine, Ser: 8.94 mg/dL — ABNORMAL HIGH (ref 0.61–1.24)
Creatinine, Ser: 9.36 mg/dL — ABNORMAL HIGH (ref 0.61–1.24)
GFR, Estimated: 6 mL/min — ABNORMAL LOW (ref >60)
GFR, Estimated: 5 mL/min — ABNORMAL LOW (ref >60)
Glucose, Bld: 159 mg/dL — ABNORMAL HIGH (ref 70–99)
Glucose, Bld: 155 mg/dL — ABNORMAL HIGH (ref 70–99)
Potassium: 3.6 mmol/L (ref 3.5–5.1)
Potassium: 3.8 mmol/L (ref 3.5–5.1)
Sodium: 136 mmol/L (ref 135–145)
Sodium: 138 mmol/L (ref 135–145)
Total Bilirubin: 0.3 mg/dL (ref 0.3–1.2)
Total Bilirubin: 0.7 mg/dL (ref 0.3–1.2)
Total Protein: 6.4 g/dL — ABNORMAL LOW (ref 6.5–8.1)
Total Protein: 5.9 g/dL — ABNORMAL LOW (ref 6.5–8.1)

## 2021-02-13 LAB — IRON AND TIBC
Iron: 33 ug/dL — ABNORMAL LOW (ref 45–182)
Saturation Ratios: 15 % — ABNORMAL LOW (ref 17.9–39.5)
TIBC: 221 ug/dL — ABNORMAL LOW (ref 250–450)
UIBC: 188 ug/dL

## 2021-02-13 LAB — CBC WITH DIFFERENTIAL/PLATELET
Abs Immature Granulocytes: 0.05 10*3/uL (ref 0.00–0.07)
Basophils Absolute: 0 10*3/uL (ref 0.0–0.1)
Basophils Relative: 0 %
Eosinophils Absolute: 0.1 10*3/uL (ref 0.0–0.5)
Eosinophils Relative: 2 %
HCT: 27 % — ABNORMAL LOW (ref 39.0–52.0)
Hemoglobin: 8.5 g/dL — ABNORMAL LOW (ref 13.0–17.0)
Immature Granulocytes: 1 %
Lymphocytes Relative: 12 %
Lymphs Abs: 0.7 10*3/uL (ref 0.7–4.0)
MCH: 27.9 pg (ref 26.0–34.0)
MCHC: 31.5 g/dL (ref 30.0–36.0)
MCV: 88.5 fL (ref 80.0–100.0)
Monocytes Absolute: 0.3 10*3/uL (ref 0.1–1.0)
Monocytes Relative: 5 %
Neutro Abs: 4.4 10*3/uL (ref 1.7–7.7)
Neutrophils Relative %: 80 %
Platelets: 227 10*3/uL (ref 150–400)
RBC: 3.05 MIL/uL — ABNORMAL LOW (ref 4.22–5.81)
RDW: 13.7 % (ref 11.5–15.5)
WBC: 5.5 10*3/uL (ref 4.0–10.5)
nRBC: 0 % (ref 0.0–0.2)

## 2021-02-13 LAB — CBC
HCT: 27.7 % — ABNORMAL LOW (ref 39.0–52.0)
Hemoglobin: 8.6 g/dL — ABNORMAL LOW (ref 13.0–17.0)
MCH: 26.9 pg (ref 26.0–34.0)
MCHC: 31 g/dL (ref 30.0–36.0)
MCV: 86.6 fL (ref 80.0–100.0)
Platelets: 220 10*3/uL (ref 150–400)
RBC: 3.2 MIL/uL — ABNORMAL LOW (ref 4.22–5.81)
RDW: 13.7 % (ref 11.5–15.5)
WBC: 4.8 10*3/uL (ref 4.0–10.5)
nRBC: 0 % (ref 0.0–0.2)

## 2021-02-13 LAB — TROPONIN I (HIGH SENSITIVITY)
Troponin I (High Sensitivity): 13 ng/L (ref <18)
Troponin I (High Sensitivity): 13 ng/L (ref <18)

## 2021-02-13 LAB — RESP PANEL BY RT-PCR (FLU A&B, COVID) ARPGX2
Influenza A by PCR: NEGATIVE
Influenza B by PCR: NEGATIVE
SARS Coronavirus 2 by RT PCR: POSITIVE — AB

## 2021-02-13 LAB — FERRITIN: Ferritin: 438 ng/mL — ABNORMAL HIGH (ref 24–336)

## 2021-02-13 LAB — VITAMIN B12: Vitamin B-12: 635 pg/mL (ref 180–914)

## 2021-02-13 LAB — SAMPLE TO BLOOD BANK

## 2021-02-13 LAB — HEPATITIS B SURFACE ANTIGEN: Hepatitis B Surface Ag: NONREACTIVE

## 2021-02-13 LAB — CK: Total CK: 46 U/L — ABNORMAL LOW (ref 49–397)

## 2021-02-13 LAB — HEPATITIS B SURFACE ANTIBODY,QUALITATIVE: Hep B S Ab: NONREACTIVE

## 2021-02-13 LAB — FOLATE: Folate: 9.5 ng/mL (ref >5.9)

## 2021-02-13 MED ORDER — LIDOCAINE-PRILOCAINE 2.5-2.5 % EX CREA: 1.0000 "application " | TOPICAL_CREAM | CUTANEOUS | Status: DC | PRN

## 2021-02-13 MED ORDER — HEPARIN SODIUM (PORCINE) 1000 UNIT/ML DIALYSIS
1000.0000 [IU] | INTRAMUSCULAR | Status: DC | PRN
  Administered 2021-02-13: 1000 [IU] via INTRAVENOUS_CENTRAL
  Filled 2021-02-13 (×2): qty 1

## 2021-02-13 MED ORDER — LIDOCAINE HCL (PF) 1 % IJ SOLN: 5.0000 mL | INTRAMUSCULAR | Status: DC | PRN

## 2021-02-13 MED ORDER — SODIUM CHLORIDE 0.9 % IV BOLUS
500.0000 mL | Freq: Once | INTRAVENOUS | Status: AC
  Administered 2021-02-13: 500 mL via INTRAVENOUS

## 2021-02-13 MED ORDER — ALPRAZOLAM 0.5 MG PO TABS
1.0000 mg | ORAL_TABLET | Freq: Once | ORAL | Status: DC
  Filled 2021-02-13: qty 2

## 2021-02-13 MED ORDER — ALTEPLASE 2 MG IJ SOLR
2.0000 mg | Freq: Once | INTRAMUSCULAR | Status: DC | PRN
  Filled 2021-02-13: qty 2

## 2021-02-13 MED ORDER — SODIUM CHLORIDE 0.9 % IV SOLN: 100.0000 mL | INTRAVENOUS | Status: DC | PRN

## 2021-02-13 MED ORDER — CHLORHEXIDINE GLUCONATE CLOTH 2 % EX PADS
6.0000 | MEDICATED_PAD | Freq: Every day | CUTANEOUS | Status: DC
  Filled 2021-02-13 (×2): qty 6

## 2021-02-13 MED ORDER — SODIUM CHLORIDE 0.9 % IV BOLUS: 1000.0000 mL | Freq: Once | INTRAVENOUS | Status: DC

## 2021-02-13 MED ORDER — ALPRAZOLAM 0.5 MG PO TABS: 0.5000 mg | ORAL_TABLET | Freq: Once | ORAL | Status: DC

## 2021-02-13 MED ORDER — HEPARIN SODIUM (PORCINE) 1000 UNIT/ML IJ SOLN
INTRAMUSCULAR | Status: AC
  Filled 2021-02-13: qty 1

## 2021-02-13 MED ORDER — PENTAFLUOROPROP-TETRAFLUOROETH EX AERO
1.0000 "application " | INHALATION_SPRAY | CUTANEOUS | Status: DC | PRN
  Filled 2021-02-13: qty 30

## 2021-02-13 NOTE — ED Notes (Signed)
Pt on hemodialysis - edp aware

## 2021-02-13 NOTE — Progress Notes (Signed)
[place]  [phone] [phone]) [phone]  ID: Herold Harms OB: [birth year]  MR#: [id number]  ALP#:[id number]  Patient Care Team: Valera Castle, MD as PCP - General (Family Medicine) Lloyd Huger, MD as Consulting Physician (Oncology)  CHIEF COMPLAINT: Anemia associated with chronic renal failure, iron deficiency anemia.  INTERVAL HISTORY: Patient initially was in clinic for lab only today, but was seen as an add-on after complaints of feeling dizzy, worsening weakness and fatigue, and abdominal pain.  He has no neurologic complaints.  He denies any recent fevers or illnesses.  He has a good appetite and denies weight loss.  He has no chest pain, shortness of breath, cough, or hemoptysis.  He denies any nausea, vomiting, constipation, or diarrhea.  He denies any melena or hematochezia.  He has no urinary complaints.  Patient feels generally terrible, but offers no further specific complaints today.  REVIEW OF SYSTEMS:   Review of Systems  Constitutional:  Positive for malaise/fatigue. Negative for fever and weight loss.  Respiratory: Negative.  Negative for cough and hemoptysis.   Cardiovascular: Negative.  Negative for chest pain and leg swelling.  Gastrointestinal:  Positive for abdominal pain. Negative for blood in stool and melena.  Genitourinary: Negative.  Negative for hematuria.  Musculoskeletal: Negative.  Negative for back pain.  Skin: Negative.  Negative for rash.  Neurological:  Positive for dizziness and weakness. Negative for focal weakness and headaches.  Psychiatric/Behavioral: Negative.  The patient is not nervous/anxious.    As per HPI. Otherwise, a complete review of systems is negative.  PAST MEDICAL HISTORY: Past Medical History:  Diagnosis Date   Actinic keratosis    Basal cell carcinoma 10/21/2017   left lateral neck, excised 02/09/2018   Basal cell carcinoma (BCC) 05/12/2019   right posterior ear, excised  05/31/2019   Blood transfusion without reported diagnosis    Diabetes mellitus without complication ([place])     PAST SURGICAL HISTORY: Past Surgical History:  Procedure Laterality Date   COLONOSCOPY WITH PROPOFOL N/A 04/29/2019   Procedure: COLONOSCOPY WITH PROPOFOL;  Surgeon: Jonathon Bellows, MD;  Location: [facility] ENDOSCOPY;  Service: Gastroenterology;  Laterality: N/A;   DIALYSIS/PERMA CATHETER INSERTION Bilateral 01/28/2021   Procedure: DIALYSIS/PERMA CATHETER INSERTION;  Surgeon: Algernon Huxley, MD;  Location: [place] CV LAB;  Service: Cardiovascular;  Laterality: Bilateral;   ESOPHAGOGASTRODUODENOSCOPY (EGD) WITH PROPOFOL N/A 04/29/2019   Procedure: ESOPHAGOGASTRODUODENOSCOPY (EGD) WITH PROPOFOL;  Surgeon: Jonathon Bellows, MD;  Location: [facility] ENDOSCOPY;  Service: Gastroenterology;  Laterality: N/A;   IR PERC CHOLECYSTOSTOMY  01/25/2021    FAMILY HISTORY: History reviewed. No pertinent family history.  ADVANCED DIRECTIVES (Y/N):  N  HEALTH MAINTENANCE: Social History   Tobacco Use   Smoking status: Former   Smokeless tobacco: Never  Scientific laboratory technician Use: Never used  Substance Use Topics   Alcohol use: Not Currently    Comment: rare once a year   Drug use: Never     Colonoscopy:  PAP:  Bone density:  Lipid panel:  Allergies  Allergen Reactions   Aspirin Other (See Comments)    GI bleed   Codeine Nausea Only and Nausea And Vomiting    No current facility-administered medications for this visit.   Current Outpatient Medications  Medication Sig Dispense Refill   apixaban (ELIQUIS) 2.5 MG TABS tablet Take 1 tablet (2.5 mg total) by mouth 2 (two) times daily. 60 tablet 3   ascorbic acid (VITAMIN C) 500 MG tablet Take 1 tablet (500 mg  total) by mouth daily. 30 tablet 0   carvedilol (COREG) 6.25 MG tablet Take 1 tablet (6.25 mg total) by mouth 2 (two) times daily with a meal. 60 tablet 0   diltiazem (CARDIZEM CD) 360 MG 24 hr capsule Take 1 capsule (360 mg total) by mouth  daily. 30 capsule 0   fenofibrate 160 MG tablet Take 160 mg by mouth at bedtime.     ferrous sulfate 325 (65 FE) MG tablet Take 325 mg by mouth 2 (two) times daily with a meal.     insulin NPH-regular Human (70-30) 100 UNIT/ML injection Inject 10-30 Units into the skin 2 (two) times daily with a meal.     isosorbide mononitrate (IMDUR) 30 MG 24 hr tablet Take 30 mg by mouth at bedtime.     Multiple Vitamin (MULTIVITAMIN) capsule Take 1 capsule by mouth daily.     sodium chloride flush (NS) 0.9 % SOLN 5 mLs by Intracatheter route every 12 (twelve) hours. 300 mL 0   terazosin (HYTRIN) 10 MG capsule Take 10 mg by mouth at bedtime.     Vitamin D, Ergocalciferol, (DRISDOL) 1.25 MG (50000 UNIT) CAPS capsule Take 1 capsule (50,000 Units total) by mouth every 7 (seven) days. 12 capsule 0   pantoprazole (PROTONIX) 40 MG tablet Take 1 tablet (40 mg total) by mouth daily. (Patient not taking: Reported on 02/13/2021) 30 tablet 0   Facility-Administered Medications Ordered in Other Visits  Medication Dose Route Frequency Provider Last Rate Last Admin   Chlorhexidine Gluconate Cloth 2 % PADS 6 each  6 each Topical Q0600 Breeze, Benancio Deeds, NP        OBJECTIVE: Vitals:   02/13/21 1000  BP: 110/89  Pulse: (!) 103  Resp: 17  Temp: (!) 96.2 F (35.7 C)  SpO2: 100%     Body mass index is 25.89 kg/m.    ECOG FS:0 - Asymptomatic  General: Ill-appearing, no acute distress. Eyes: Pink conjunctiva, anicteric sclera. HEENT: Normocephalic, moist mucous membranes. Lungs: No audible wheezing or coughing. Heart: Regular rate and rhythm. Abdomen: Soft, nontender, no obvious distention. Musculoskeletal: No edema, cyanosis, or clubbing. Neuro: Alert, answering all questions appropriately. Cranial nerves grossly intact. Skin: No rashes or petechiae noted. Psych: Flat affect.   LAB RESULTS:  Lab Results  Component Value Date   NA 138 02/13/2021   K 3.8 02/13/2021   CL 105 02/13/2021   CO2 22 02/13/2021    GLUCOSE 155 (H) 02/13/2021   BUN 40 (H) 02/13/2021   CREATININE 9.36 (H) 02/13/2021   CALCIUM 8.3 (L) 02/13/2021   PROT 5.9 (L) 02/13/2021   ALBUMIN 2.4 (L) 02/13/2021   AST 21 02/13/2021   ALT 11 02/13/2021   ALKPHOS 33 (L) 02/13/2021   BILITOT 0.7 02/13/2021   GFRNONAA 5 (L) 02/13/2021   GFRAA 20 (L) 07/05/2019    Lab Results  Component Value Date   WBC 4.8 02/13/2021   NEUTROABS 4.4 02/13/2021   HGB 8.6 (L) 02/13/2021   HCT 27.7 (L) 02/13/2021   MCV 86.6 02/13/2021   PLT 220 02/13/2021   Lab Results  Component Value Date   IRON 33 (L) 02/13/2021   TIBC 221 (L) 02/13/2021   IRONPCTSAT 15 (L) 02/13/2021   Lab Results  Component Value Date   FERRITIN 438 (H) 02/13/2021     STUDIES: CT ABDOMEN PELVIS WO CONTRAST  Result Date: 01/23/2021 CLINICAL DATA:  Abdominal pain, acute, nonlocalized. Generalized weakness. Hypotension. EXAM: CT ABDOMEN AND PELVIS WITHOUT CONTRAST TECHNIQUE: Multidetector CT imaging  of the abdomen and pelvis was performed following the standard protocol without IV contrast. COMPARISON:  08/09/2012 FINDINGS: Lower chest: Linear scarring or atelectasis at the right lung base. Otherwise no active lower chest finding. Hepatobiliary: Liver appears normal without contrast. 1 cm calcified stone in the gallbladder neck without definite CT evidence of cholecystitis. Consider right upper quadrant ultrasound if concern persists regarding possible clinical cholecystitis. Pancreas: Normal Spleen: Normal Adrenals/Urinary Tract: Adrenal glands are normal. Multiple bilateral renal cysts. Nonobstructing 6 mm stone in the lower pole of the left kidney. No passing stone or hydronephrosis. No stone in the bladder. Stomach/Bowel: Stomach and small intestine are normal. No evidence of diverticulosis or diverticulitis. Vascular/Lymphatic: Aortic atherosclerosis. No aneurysm. IVC is normal. No retroperitoneal adenopathy. Reproductive: Enlarged prostate. Other: Tiny amount of free  fluid in the pelvic cul-de-sac. No free air. Musculoskeletal: Ordinary lower lumbar degenerative changes. IMPRESSION: 1 cm gallstone in the gallbladder neck. No CT evidence of cholecystitis. Consider right upper quadrant ultrasound if there is clinical concern regarding potential cholecystitis. Aortic Atherosclerosis (ICD10-I70.0). Multiple renal cysts. 6 mm nonobstructing stone in the lower pole of the left kidney. Enlarged prostate. Electronically Signed   By: Nelson Chimes M.D.   On: 01/23/2021 14:58   NM Hepatobiliary Liver Func  Result Date: 01/24/2021 CLINICAL DATA:  Abdominal pain.  Cholelithiasis EXAM: NUCLEAR MEDICINE HEPATOBILIARY IMAGING TECHNIQUE: Sequential images of the abdomen were obtained out to 60 minutes following intravenous administration of radiopharmaceutical. RADIOPHARMACEUTICALS:  5.6 mCi Tc-22m  Choletec IV COMPARISON:  CT 01/23/2021 FINDINGS: Prompt clearance of radiotracer from blood pool and homogeneous uptake in liver. Counts are evident in the small bowel by 50 minutes. At 90 minutes, IV morphine was administered. Gallbladder failed to contract after morphine augmentation. IMPRESSION: Non filling of the gall bladder is consistent with cystic duct obstruction (acute cholecystitis). These results will be called to the ordering clinician or representative by the Radiologist Assistant, and communication documented in the PACS or [employer]. Electronically Signed   By: Suzy Bouchard M.D.   On: 01/24/2021 15:59   PERIPHERAL VASCULAR CATHETERIZATION  Result Date: 01/28/2021 See surgical note for result.  IR Perc Cholecystostomy  Result Date: 01/25/2021 INDICATION: 80 year old with evidence of acute cholecystitis based on imaging. Request for a percutaneous cholecystostomy tube placement. EXAM: Percutaneous cholecystostomy tube placement with ultrasound and fluoroscopic guidance. MEDICATIONS: Moderate sedation.  Patient is receiving scheduled IV antibiotics.  ANESTHESIA/SEDATION: Moderate (conscious) sedation was employed during this procedure. A total of Versed 1.0mg  and fentanyl 50 mcg was administered intravenously at the order of the provider performing the procedure. Total intra-service moderate sedation time: 14 minutes. Patient's level of consciousness and vital signs were monitored continuously by radiology nurse throughout the procedure under the supervision of the provider performing the procedure. FLUOROSCOPY TIME:  Fluoroscopy Time: 12 seconds, 2.4 mGy CONTRAST:  3 mL Omnipaque [id number] COMPLICATIONS: None immediate. PROCEDURE: Informed written consent was obtained from the patient after a thorough discussion of the procedural risks, benefits and alternatives. All questions were addressed. A timeout was performed prior to the initiation of the procedure. Ultrasound was used to identify the gallbladder. Ultrasound image was saved for documentation. The right side of the abdomen was prepped and draped in sterile fashion. Maximal barrier sterile technique was utilized including caps, mask, sterile gowns, sterile gloves, sterile drape, hand hygiene and skin antiseptic. Skin was anesthetized using 1% lidocaine. Small incision was made. Using ultrasound guidance, an 18 gauge trocar needle was directed into the gallbladder using a transhepatic approach. A  superstiff Amplatz wire was advanced to the gallbladder using ultrasound and fluoroscopic guidance. The tract was dilated to accommodate a 10 [place] multipurpose drain. Cloudy bilious fluid was aspirated from the gallbladder. The gallbladder was decompressed at the end of the procedure. Fluid sample sent for culture. Drain was secured to the skin with silk suture and attached to a gravity bag. Dressing was placed. FINDINGS: Gallbladder was mildly distended at the beginning of the procedure. Drain successfully placed in the gallbladder and the gallbladder was decompressed at the end of the procedure. Fluoroscopy  demonstrates a stone near the base of the gallbladder. IMPRESSION: Successful percutaneous cholecystostomy tube placement with ultrasound and fluoroscopic guidance. Electronically Signed   By: Markus Daft M.D.   On: 01/25/2021 13:06   DG Chest Port 1 View  Result Date: 01/23/2021 CLINICAL DATA:  Weakness Increase lactic acid EXAM: PORTABLE CHEST 1 VIEW COMPARISON:  None. FINDINGS: Heart size within normal limits. No pulmonary vascular congestion. Right hemidiaphragm is elevated. Lungs are clear. IMPRESSION: No acute cardiopulmonary process. Electronically Signed   By: Miachel Roux M.D.   On: 01/23/2021 16:32   US Abdomen Limited RUQ (LIVER/GB)  Result Date: 01/23/2021 CLINICAL DATA:  Right upper quadrant pain EXAM: ULTRASOUND ABDOMEN LIMITED RIGHT UPPER QUADRANT COMPARISON:  CT 01/23/2021 FINDINGS: Gallbladder: Sludge within the gallbladder. 16 mm non mobile stone at the gallbladder neck. Slight increased wall thickness of 3.7 mm but negative sonographic Murphy. Common bile duct: Diameter: 5.2 mm Liver: No focal lesion identified. Within normal limits in parenchymal echogenicity. Portal vein is patent on color Doppler imaging with normal direction of blood flow towards the liver. Other: Right kidney cortex appears slightly echogenic. IMPRESSION: 1. Sludge in the gallbladder with non mobile stone at the gallbladder neck with slight increased wall thickness but negative sonographic Murphy. Findings are indeterminate for cholecystitis, consider correlation with nuclear medicine hepatobiliary imaging 2. Right kidney cortex appears slightly echogenic suggesting medical renal disease, correlate with appropriate laboratory values Electronically Signed   By: Donavan Foil M.D.   On: 01/23/2021 15:45     ASSESSMENT: Anemia associated with chronic renal failure, iron deficiency anemia.  PLAN:    1.  Anemia associated with chronic renal failure, iron deficiency anemia: EGD, virtual endoscopy, and colonoscopy in  January 2021 were unrevealing, but 2 polyps were removed.  Patient's hemoglobin is decreased, but approximately baseline of 8.5.  It is noted to have decreased iron stores and may benefit from IV Venofer in the near future.  Previously, patient did not feel Retacrit offers much benefit.  He last received treatment on December 20, 2020.  Given patient's decreased performance status, he was transferred to the ER for further evaluation.   2.  Chronic renal insufficiency: Patient is now on dialysis and states he is scheduled for treatment today.  Transferred to the ER as above.   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 4.  Leukopenia: Resolved. 5.  Thrombocytopenia: Resolved. 6.  Percutaneous cholecystectomy tube: Placed on January 25, 2021.  Given patient's abdominal pain, he may require repeat imaging.  Transport to the emergency room as above.   Patient expressed understanding and was in agreement with this plan. He also understands that He can call clinic at any time with any questions, concerns, or complaints.    Lloyd Huger, MD   02/13/2021 12:23 PM

## 2021-02-13 NOTE — Discharge Instructions (Signed)
You have been seen in the emergency department today for generalized weakness.  Your results have shown that you are COVID-positive.  Please obtain plenty of rest, use Tylenol as needed for fever/discomfort as written on the box.  Return to the emergency department for any significant increase in weakness, any shortness of breath or trouble breathing or any other symptom personally concerning to yourself.  Otherwise please call your dialysis center tomorrow to inform them that you have COVID and they will make arrangements for you to be dialyzed on Friday.

## 2021-02-13 NOTE — ED Provider Notes (Signed)
[facility] Emergency Department Provider Note  Time seen: 10:53 AM  I have reviewed the triage vital signs and the nursing notes.   HISTORY  Chief Complaint Dizziness, Weakness, and Abnormal Lab   HPI Gregory Dixon is a 80 y.o. 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  Patient was going to the cancer center for routine lab work today when he became weak and lightheaded.  Patient states he has been experiencing the symptoms intermittently over the past month or so.  Patient states he had lab work today and per report patient's creatinine had increased significantly however patient states he just started dialysis 2 weeks ago.  Patient has a right subclavian dialysis catheter present.   Past Medical History:  Diagnosis Date   Actinic keratosis    Basal cell carcinoma 10/21/2017   left lateral neck, excised 02/09/2018   Basal cell carcinoma (BCC) 05/12/2019   right posterior ear, excised 05/31/2019   Blood transfusion without reported diagnosis    Diabetes mellitus without complication [facility])     Patient Active Problem List   Diagnosis Date Noted   Cholecystitis 01/24/2021   Weakness 01/23/2021   Elevated LFTs 01/23/2021   Transaminitis 01/23/2021   MGUS (monoclonal gammopathy of unknown significance)    Thrombocytopenia (HCC)    Acute anemia 11/23/2020   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   Chronic kidney disease 05/11/2019   Hyperlipidemia 05/11/2019   Hypertension, 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   Anemia due to vitamin B12 deficiency 05/05/2019   Benign prostatic hyperplasia 05/05/2019   Acute lower GI bleeding 04/27/2019   Acute blood loss anemia 04/27/2019   Acute kidney injury superimposed on CKD ([place]) 04/27/2019   Iron deficiency anemia due to chronic blood loss 04/27/2019    Type 2 diabetes mellitus with stage 4 chronic kidney disease, without long-term current use of insulin ([place]) 10/16/2015   Essential hypertension 10/16/2015   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   Atherosclerosis 09/29/2012   Cholelithiasis 09/29/2012   Nephrolithiasis 09/29/2012   Diastasis recti 08/23/2012   Diabetic peripheral neuropathy associated with type 2 diabetes mellitus ([place]) 02/20/2011   Heart murmur, systolic 16/60/6301   Loss of feeling or sensation 02/20/2011   Obesity 02/20/2011    Past Surgical History:  Procedure Laterality Date   COLONOSCOPY WITH PROPOFOL N/A 04/29/2019   Procedure: COLONOSCOPY WITH PROPOFOL;  Surgeon: Jonathon Bellows, MD;  Location: [facility] ENDOSCOPY;  Service: Gastroenterology;  Laterality: N/A;   DIALYSIS/PERMA CATHETER INSERTION Bilateral 01/28/2021   Procedure: DIALYSIS/PERMA CATHETER INSERTION;  Surgeon: Algernon Huxley, MD;  Location: [facility] CV LAB;  Service: Cardiovascular;  Laterality: Bilateral;   ESOPHAGOGASTRODUODENOSCOPY (EGD) WITH PROPOFOL N/A 04/29/2019   Procedure: ESOPHAGOGASTRODUODENOSCOPY (EGD) WITH PROPOFOL;  Surgeon: Jonathon Bellows, MD;  Location: [facility] ENDOSCOPY;  Service: Gastroenterology;  Laterality: N/A;   IR PERC CHOLECYSTOSTOMY  01/25/2021    Prior to Admission medications   Medication Sig Start Date End Date Taking? Authorizing Provider  apixaban (ELIQUIS) 2.5 MG TABS tablet Take 1 tablet (2.5 mg total) by mouth 2 (two) times daily. 01/31/21   Val Riles, MD  ascorbic acid (VITAMIN C) 500 MG tablet Take 1 tablet (500 mg total) by mouth daily. 02/01/21 03/03/21  Val Riles, MD  carvedilol (COREG) 6.25 MG tablet Take 1 tablet (6.25 mg total) by mouth 2 (two) times daily with  a meal. 01/31/21 03/02/21  Val Riles, MD  diltiazem (CARDIZEM CD) 360 MG 24 hr capsule Take 1 capsule (360 mg total) by mouth daily. 02/01/21 03/03/21  Val Riles, MD  fenofibrate 160 MG tablet Take 160 mg by mouth at bedtime.     [provider]  ferrous sulfate 325 (65 FE) MG tablet Take 325 mg by mouth 2 (two) times daily with a meal.    [provider]  insulin NPH-regular Human (70-30) 100 UNIT/ML injection Inject 55 Units into the skin 2 (two) times daily with a meal.    [provider]  isosorbide mononitrate (IMDUR) 30 MG 24 hr tablet Take 30 mg by mouth at bedtime. 03/14/19   [provider]  Multiple Vitamin (MULTIVITAMIN) capsule Take 1 capsule by mouth daily.    [provider]  pantoprazole (PROTONIX) 40 MG tablet Take 1 tablet (40 mg total) by mouth daily. Patient not taking: Reported on 02/13/2021 11/25/20 12/25/20  Loletha Grayer, MD  sodium chloride flush (NS) 0.9 % SOLN 5 mLs by Intracatheter route every 12 (twelve) hours. 01/26/21 02/25/21  Benjamine Sprague, DO  terazosin (HYTRIN) 10 MG capsule Take 10 mg by mouth at bedtime. 03/13/19   [provider]  Vitamin D, Ergocalciferol, (DRISDOL) 1.25 MG (50000 UNIT) CAPS capsule Take 1 capsule (50,000 Units total) by mouth every 7 (seven) days. 02/07/21 05/08/21  Val Riles, MD    Allergies  Allergen Reactions   Aspirin Other (See Comments)    GI bleed   Codeine Nausea Only and Nausea And Vomiting    History reviewed. No pertinent family history.  Social History Social History   Tobacco Use   Smoking status: Former   Smokeless tobacco: Never  Scientific laboratory technician Use: Never used  Substance Use Topics   Alcohol use: Not Currently    Comment: rare once a year   Drug use: Never    Review of Systems Constitutional: Negative for fever.  Generalized fatigue/weakness intermittent x1 month Cardiovascular: Negative for chest pain. Respiratory: Negative for shortness of breath. Gastrointestinal: Negative for abdominal pain, vomiting  Musculoskeletal: Negative for musculoskeletal complaints Neurological: Negative for headache All other ROS  negative  ____________________________________________   PHYSICAL EXAM:  VITAL SIGNS: ED Triage Vitals [02/13/21 1027]  Enc Vitals Group     BP      Pulse      Resp      Temp      Temp src      SpO2      Weight 223 lb 15.8 oz (101.6 kg)     Height 6\' 6"  (1.981 m)     Head Circumference      Peak Flow      Pain Score 0     Pain Loc      Pain Edu?      Excl. in [place]?    Constitutional: Awake alert, no acute distress.  Patient is fairly pale appearing. Eyes: Normal exam, strabismus. ENT      Head: Normocephalic and atraumatic.      Mouth/Throat: Mucous membranes are moist. Cardiovascular: Normal rate, regular rhythm Respiratory: Normal respiratory effort without tachypnea nor retractions. Breath sounds are clear  Gastrointestinal: Soft and nontender. No distention.  Musculoskeletal: Nontender with normal range of motion in all extremities. Neurologic:  Normal speech and language. No gross focal neurologic deficits  Skin:  Skin is warm, dry and intact.  Psychiatric: Mood and affect are normal.   ____________________________________________  EKG  EKG viewed and interpreted by myself shows atrial fibrillation at 116 bpm with a narrow QRS, normal axis, normal intervals with nonspecific ST changes without ST elevation.  ____________________________________________   INITIAL IMPRESSION / ASSESSMENT AND PLAN / ED COURSE  Pertinent labs & imaging results that were available during my care of the patient were reviewed by me and considered in my medical decision making (see chart for details).   Patient presents emergency department for generalized fatigue/weakness.  Patient states symptoms have been ongoing for the past month or so but seem to be getting worse.  Patient just started hemodialysis 2 weeks ago.  Per report had abnormal lab work including elevated creatinine however patient is on hemodialysis.  We will repeat labs, IV hydrate, test for COVID and continue to  closely monitor in the emergency department.  Patient agreeable to plan of care.  Patient has resulted COVID-positive which is very likely the cause of the patient's significant weakness.  Lab work otherwise largely nonrevealing anemia near baseline levels.  Creatinine is elevated however patient is now on dialysis.  I spoke to Dr. Holley Raring of nephrology who is going to arrange for the patient to be dialyzed from the emergency department.  The patient states he is feeling much better and wishes to go home.  If we are able to dialyze the patient from the emergency department we will discharge the patient home and needs to follow-up on Friday for his next dialysis appointment.  Patient agreeable to plan of care.  Gregory Dixon was evaluated in Emergency Department on 02/13/2021 for the symptoms described in the history of present illness. He was evaluated in the context of the global COVID-19 pandemic, which necessitated consideration that the patient might be at risk for infection with the SARS-CoV-2 virus that causes COVID-19. Institutional protocols and algorithms that pertain to the evaluation of patients at risk for COVID-19 are in a state of rapid change based on information released by regulatory bodies including the CDC and federal and state organizations. These policies and algorithms were followed during the patient's care in the ED.  ____________________________________________   FINAL CLINICAL IMPRESSION(S) / ED DIAGNOSES  Weakness COVID-19   Harvest Dark, MD 02/13/21 1342

## 2021-02-13 NOTE — Progress Notes (Signed)
[facility]  ROUNDING NOTE   Subjective:   Gregory Dixon is a 80 year old male with past medical history of B12 deficiency, hypertension, hyperlipidemia, and CKD.  Patient presents to the emergency room from cancer center appointment with abnormal labs, dizziness, and weakness.    Patient is known to our clinic and receives outpatient dialysis treatments at [facility], supervised by Dr. Candiss Norse.  Patient is new to dialysis, treatments initiated on 01/28/2021.  He receives iron infusions from cancer center.  Patient reports becoming lightheaded and weak during his appointment today.  He endorses the same symptoms intermittently over the past month.  Wife is at bedside, states they were going to get an iron infusion been going for scheduled dialysis treatment.  Labs on arrival are sodium 136, potassium 3.7, BUN 40, creatinine 8.94, albumin 2.8, and GFR 6.  We have been consulted to provide dialysis needs during this evaluation.   Objective:  Vital signs in last 24 hours:  Temp:  [96.2 F (35.7 C)] 96.2 F (35.7 C) (11/09 1000) Pulse Rate:  [65-103] 65 (11/09 1416) Resp:  [17-24] 20 (11/09 1416) BP: (103-110)/(49-89) 103/51 (11/09 1416) SpO2:  [98 %-100 %] 98 % (11/09 1416) Weight:  [94.3 kg-101.6 kg] 94.3 kg (11/09 1055)  Weight change:  Filed Weights   02/13/21 1027 02/13/21 1055  Weight: 101.6 kg 94.3 kg    Intake/Output: No intake/output data recorded.   Intake/Output this shift:  Total I/O In: -  Out: 100 [Drains:100]  Physical Exam: General: NAD, resting on stretcher  Head: Normocephalic, atraumatic. Moist oral mucosal membranes  Eyes: Anicteric  Lungs:  Clear to auscultation, normal effort  Heart: Irregular rhythm and rate  Abdomen:  Soft, nontender  Extremities: trace peripheral edema.  Neurologic: Nonfocal, moving all four extremities  Skin: No lesions  Access Rt IJ Permcath    Basic Metabolic Panel: Recent Labs  Lab 02/13/21 0858  02/13/21 1052  NA 136 138  K 3.6 3.8  CL 99 105  CO2 24 22  GLUCOSE 159* 155*  BUN 40* 40*  CREATININE 8.94* 9.36*  CALCIUM 8.3* 8.3*     Liver Function Tests: Recent Labs  Lab 02/13/21 0858 02/13/21 1052  AST 18 21  ALT 11 11  ALKPHOS 36* 33*  BILITOT 0.3 0.7  PROT 6.4* 5.9*  ALBUMIN 2.8* 2.4*    No results for input(s): LIPASE, AMYLASE in the last 168 hours.  No results for input(s): AMMONIA in the last 168 hours.  CBC: Recent Labs  Lab 02/13/21 0858 02/13/21 1052  WBC 5.5 4.8  NEUTROABS 4.4  --   HGB 8.5* 8.6*  HCT 27.0* 27.7*  MCV 88.5 86.6  PLT 227 220     Cardiac Enzymes: Recent Labs  Lab 02/13/21 1052  CKTOTAL 46*    BNP: Invalid input(s): POCBNP  CBG: No results for input(s): GLUCAP in the last 168 hours.   Microbiology: Results for orders placed or performed during the hospital encounter of 02/13/21  Resp Panel by RT-PCR (Flu A&B, Covid) Nasopharyngeal Swab     Status: Abnormal   Collection Time: 02/13/21 10:52 AM   Specimen: Nasopharyngeal Swab; Nasopharyngeal(NP) swabs in vial transport medium  Result Value Ref Range Status   SARS Coronavirus 2 by RT PCR POSITIVE (A) NEGATIVE Final    Comment: RESULT CALLED TO, READ BACK BY AND VERIFIED WITH: Edwin Dada, RN [phone] 02/13/21 GM (NOTE) SARS-CoV-2 target nucleic acids are DETECTED.  The SARS-CoV-2 RNA is generally detectable in upper respiratory specimens during  the acute phase of infection. Positive results are indicative of the presence of the identified virus, but do not rule out bacterial infection or co-infection with other pathogens not detected by the test. Clinical correlation with patient history and other diagnostic information is necessary to determine patient infection status. The expected result is Negative.  Fact Sheet for Patients: EntrepreneurPulse.com.au  Fact Sheet for Healthcare Providers: IncredibleEmployment.be  This test is  not yet approved or cleared by the [place] FDA and  has been authorized for detection and/or diagnosis of SARS-CoV-2 by FDA under an Emergency Use Authorization (EUA).  This EUA will remain in effect (meaning this test can be [place] ed) for the duration of  the COVID-19 declaration under Section 564(b)(1) of the Act, 21 U.S.C. section 360bbb-3(b)(1), unless the authorization is terminated or revoked sooner.     Influenza A by PCR NEGATIVE NEGATIVE Final   Influenza B by PCR NEGATIVE NEGATIVE Final    Comment: (NOTE) The Xpert Xpress SARS-CoV-2/FLU/RSV plus assay is intended as an aid in the diagnosis of influenza from Nasopharyngeal swab specimens and should not be used as a sole basis for treatment. Nasal washings and aspirates are unacceptable for Xpert Xpress SARS-CoV-2/FLU/RSV testing.  Fact Sheet for Patients: EntrepreneurPulse.com.au  Fact Sheet for Healthcare Providers: IncredibleEmployment.be  This test is not yet approved or cleared by the [place] FDA and has been authorized for detection and/or diagnosis of SARS-CoV-2 by FDA under an Emergency Use Authorization (EUA). This EUA will remain in effect (meaning this test can be used) for the duration of the COVID-19 declaration under Section 564(b)(1) of the Act, 21 U.S.C. section 360bbb-3(b)(1), unless the authorization is terminated or revoked.  Performed at [facility], [address] [place], [place] [address]     Coagulation Studies: No results for input(s): LABPROT, INR in the last 72 hours.   Urinalysis: No results for input(s): COLORURINE, LABSPEC, PHURINE, GLUCOSEU, HGBUR, BILIRUBINUR, KETONESUR, PROTEINUR, UROBILINOGEN, NITRITE, LEUKOCYTESUR in the last 72 hours.  Invalid input(s): APPERANCEUR    Imaging: No results found.   Medications:    sodium chloride     sodium chloride      Chlorhexidine Gluconate Cloth  6 each Topical Q0600    sodium chloride, sodium chloride, alteplase, heparin, lidocaine (PF), lidocaine-prilocaine, pentafluoroprop-tetrafluoroeth  Assessment/ Plan:  Gregory Dixon is a 80 y.o.  male with past medical history of B12 deficiency, hypertension, hyperlipidemia, and CKD.  Patient presents to the emergency room with complaints of weakness.  Admitted to observation status for weakness   [facility]/MWF/right IJ PermCath  End stage renal disease requiring dialysis Scheduled to receive hemodialysis.  Lab Results  Component Value Date   CREATININE 9.36 (H) 02/13/2021   CREATININE 8.94 (H) 02/13/2021   CREATININE 4.61 (H) 01/31/2021    Intake/Output Summary (Last 24 hours) at 02/13/2021 1621 Last data filed at 02/13/2021 1500 Gross per 24 hour  Intake --  Output 100 ml  Net -100 ml    2. Anemia of chronic kidney disease Lab Results  Component Value Date   HGB 8.6 (L) 02/13/2021  Hemoglobin below target.  Receives iron infusions from cancer center.   3. Secondary Hyperparathyroidism:  Lab Results  Component Value Date   CALCIUM 8.3 (L) 02/13/2021   PHOS 4.3 01/31/2021  Calcium not at goal.  Weekly vitamin D outpatient  4.  Hypertension with chronic kidney disease.  Home regimen includes carvedilol, hydralazine laying, and isosorbide.  Currently 103/51  LOS: 0 Haydee Jabbour 11/9/20224:21 PM

## 2021-02-13 NOTE — ED Provider Notes (Signed)
Patient received full dialysis session.  Vital signs within normal limits.  He is stable for discharge.   Rada Hay, MD 02/13/21 2027

## 2021-02-13 NOTE — ED Triage Notes (Signed)
Pt comes into the ED via Cancer center for weakness, dizziness, and abnormal labs.  Pt was over in the cancer center to have labs completed where they found him slumped over in the chair and feeling dizzy.  Pts lab showed a creatinine over 9 when it was at 4 last week.  Pt goes to cancer center for iron infusions.

## 2021-02-13 NOTE — Telephone Encounter (Signed)
Informed [facility] patient was transported to ED from the [facility] this morning and will not make dialysis appointment today.

## 2021-02-13 NOTE — ED Notes (Signed)
Pt states ongoing weakness, dizziness when moving head.

## 2021-02-13 NOTE — ED Notes (Signed)
This RN cleaned and redressed area around biliary tuber per pt request.

## 2021-02-13 NOTE — Progress Notes (Signed)
Per MD patient needs to go to ER. Called report to ER. Patient transported with family member.

## 2021-02-13 NOTE — Progress Notes (Signed)
Patient here for oncology follow-up appointment, concerns of dizziness and sweating

## 2021-02-13 NOTE — ED Notes (Signed)
This RN attempted to collect urine.  Pt states that he does not produce urine regularly and it has been 3 days since he last urinated.

## 2021-02-14 ENCOUNTER — Inpatient Hospital Stay: Payer: Medicare Other

## 2021-02-14 ENCOUNTER — Inpatient Hospital Stay: Payer: Medicare Other | Admitting: Nurse Practitioner

## 2021-02-14 LAB — KAPPA/LAMBDA LIGHT CHAINS
Kappa free light chain: 249.3 mg/L — ABNORMAL HIGH (ref 3.3–19.4)
Kappa, lambda light chain ratio: 1.79 — ABNORMAL HIGH (ref 0.26–1.65)
Lambda free light chains: 139 mg/L — ABNORMAL HIGH (ref 5.7–26.3)

## 2021-02-16 LAB — HEPATITIS B SURFACE ANTIBODY, QUANTITATIVE: Hep B S AB Quant (Post): <3.1 m[IU]/mL — ABNORMAL LOW (ref >9.9)

## 2021-02-18 LAB — MULTIPLE MYELOMA PANEL, SERUM
Albumin SerPl Elph-Mcnc: 2.9 g/dL (ref 2.9–4.4)
Albumin/Glob SerPl: 1 (ref 0.7–1.7)
Alpha 1: 0.3 g/dL (ref 0.0–0.4)
Alpha2 Glob SerPl Elph-Mcnc: 0.9 g/dL (ref 0.4–1.0)
B-Globulin SerPl Elph-Mcnc: 0.8 g/dL (ref 0.7–1.3)
Gamma Glob SerPl Elph-Mcnc: 1.1 g/dL (ref 0.4–1.8)
Globulin, Total: 3 g/dL (ref 2.2–3.9)
IgA: 290 mg/dL (ref 61–437)
IgG (Immunoglobin G), Serum: 1044 mg/dL (ref 603–1613)
IgM (Immunoglobulin M), Srm: 212 mg/dL — ABNORMAL HIGH (ref 15–143)
M Protein SerPl Elph-Mcnc: 0.4 g/dL — ABNORMAL HIGH
Total Protein ELP: 5.9 g/dL — ABNORMAL LOW (ref 6.0–8.5)

## 2021-02-20 ENCOUNTER — Emergency Department: Payer: Medicare Other

## 2021-02-20 ENCOUNTER — Encounter: Payer: Self-pay | Admitting: Internal Medicine

## 2021-02-20 ENCOUNTER — Other Ambulatory Visit: Payer: Self-pay

## 2021-02-20 ENCOUNTER — Inpatient Hospital Stay
Admission: EM | Admit: 2021-02-20 | Discharge: 2021-02-25 | DRG: 377 | Disposition: A | Discharge status: Home Health | Payer: Medicare Other | Attending: Internal Medicine | Admitting: Internal Medicine

## 2021-02-20 DIAGNOSIS — K625 Hemorrhage of anus and rectum: Secondary | ICD-10-CM | POA: Diagnosis not present

## 2021-02-20 DIAGNOSIS — I4821 Permanent atrial fibrillation: Secondary | ICD-10-CM | POA: Diagnosis present

## 2021-02-20 DIAGNOSIS — K922 Gastrointestinal hemorrhage, unspecified: Secondary | ICD-10-CM | POA: Diagnosis present

## 2021-02-20 DIAGNOSIS — Z87891 Personal history of nicotine dependence: Secondary | ICD-10-CM

## 2021-02-20 DIAGNOSIS — U071 COVID-19: Secondary | ICD-10-CM | POA: Diagnosis present

## 2021-02-20 DIAGNOSIS — W19XXXA Unspecified fall, initial encounter: Secondary | ICD-10-CM | POA: Diagnosis present

## 2021-02-20 DIAGNOSIS — I120 Hypertensive chronic kidney disease with stage 5 chronic kidney disease or end stage renal disease: Secondary | ICD-10-CM | POA: Diagnosis present

## 2021-02-20 DIAGNOSIS — D175 Benign lipomatous neoplasm of intra-abdominal organs: Secondary | ICD-10-CM | POA: Diagnosis present

## 2021-02-20 DIAGNOSIS — E1122 Type 2 diabetes mellitus with diabetic chronic kidney disease: Secondary | ICD-10-CM | POA: Diagnosis present

## 2021-02-20 DIAGNOSIS — Z66 Do not resuscitate: Secondary | ICD-10-CM | POA: Diagnosis present

## 2021-02-20 DIAGNOSIS — Z9049 Acquired absence of other specified parts of digestive tract: Secondary | ICD-10-CM

## 2021-02-20 DIAGNOSIS — D649 Anemia, unspecified: Principal | ICD-10-CM

## 2021-02-20 DIAGNOSIS — Z886 Allergy status to analgesic agent status: Secondary | ICD-10-CM

## 2021-02-20 DIAGNOSIS — S0990XA Unspecified injury of head, initial encounter: Secondary | ICD-10-CM | POA: Diagnosis present

## 2021-02-20 DIAGNOSIS — I10 Essential (primary) hypertension: Secondary | ICD-10-CM | POA: Diagnosis present

## 2021-02-20 DIAGNOSIS — N179 Acute kidney failure, unspecified: Secondary | ICD-10-CM | POA: Diagnosis present

## 2021-02-20 DIAGNOSIS — R296 Repeated falls: Secondary | ICD-10-CM | POA: Diagnosis present

## 2021-02-20 DIAGNOSIS — K921 Melena: Principal | ICD-10-CM | POA: Diagnosis present

## 2021-02-20 DIAGNOSIS — E43 Unspecified severe protein-calorie malnutrition: Secondary | ICD-10-CM | POA: Diagnosis present

## 2021-02-20 DIAGNOSIS — N189 Chronic kidney disease, unspecified: Secondary | ICD-10-CM | POA: Diagnosis present

## 2021-02-20 DIAGNOSIS — E1141 Type 2 diabetes mellitus with diabetic mononeuropathy: Secondary | ICD-10-CM | POA: Diagnosis present

## 2021-02-20 DIAGNOSIS — Z86010 Personal history of colonic polyps: Secondary | ICD-10-CM

## 2021-02-20 DIAGNOSIS — D62 Acute posthemorrhagic anemia: Secondary | ICD-10-CM | POA: Diagnosis not present

## 2021-02-20 DIAGNOSIS — E785 Hyperlipidemia, unspecified: Secondary | ICD-10-CM | POA: Diagnosis present

## 2021-02-20 DIAGNOSIS — Z85828 Personal history of other malignant neoplasm of skin: Secondary | ICD-10-CM

## 2021-02-20 DIAGNOSIS — Z8249 Family history of ischemic heart disease and other diseases of the circulatory system: Secondary | ICD-10-CM

## 2021-02-20 DIAGNOSIS — K641 Second degree hemorrhoids: Secondary | ICD-10-CM | POA: Diagnosis present

## 2021-02-20 DIAGNOSIS — K635 Polyp of colon: Secondary | ICD-10-CM | POA: Diagnosis present

## 2021-02-20 DIAGNOSIS — N186 End stage renal disease: Secondary | ICD-10-CM

## 2021-02-20 DIAGNOSIS — D472 Monoclonal gammopathy: Secondary | ICD-10-CM | POA: Diagnosis present

## 2021-02-20 DIAGNOSIS — Z6824 Body mass index (BMI) 24.0-24.9, adult: Secondary | ICD-10-CM

## 2021-02-20 DIAGNOSIS — Z794 Long term (current) use of insulin: Secondary | ICD-10-CM

## 2021-02-20 DIAGNOSIS — K449 Diaphragmatic hernia without obstruction or gangrene: Secondary | ICD-10-CM | POA: Diagnosis present

## 2021-02-20 DIAGNOSIS — Z79899 Other long term (current) drug therapy: Secondary | ICD-10-CM

## 2021-02-20 DIAGNOSIS — R531 Weakness: Secondary | ICD-10-CM

## 2021-02-20 DIAGNOSIS — Z885 Allergy status to narcotic agent status: Secondary | ICD-10-CM

## 2021-02-20 DIAGNOSIS — I959 Hypotension, unspecified: Secondary | ICD-10-CM | POA: Diagnosis present

## 2021-02-20 DIAGNOSIS — N2581 Secondary hyperparathyroidism of renal origin: Secondary | ICD-10-CM | POA: Diagnosis present

## 2021-02-20 DIAGNOSIS — N400 Enlarged prostate without lower urinary tract symptoms: Secondary | ICD-10-CM | POA: Diagnosis present

## 2021-02-20 DIAGNOSIS — D631 Anemia in chronic kidney disease: Secondary | ICD-10-CM | POA: Diagnosis present

## 2021-02-20 DIAGNOSIS — Z992 Dependence on renal dialysis: Secondary | ICD-10-CM

## 2021-02-20 DIAGNOSIS — Z7901 Long term (current) use of anticoagulants: Secondary | ICD-10-CM

## 2021-02-20 HISTORY — DX: Cardiac arrhythmia, unspecified: I49.9

## 2021-02-20 LAB — CBC
HCT: 23.4 % — ABNORMAL LOW (ref 39.0–52.0)
Hemoglobin: 7.2 g/dL — ABNORMAL LOW (ref 13.0–17.0)
MCH: 27.6 pg (ref 26.0–34.0)
MCHC: 30.8 g/dL (ref 30.0–36.0)
MCV: 89.7 fL (ref 80.0–100.0)
Platelets: 186 10*3/uL (ref 150–400)
RBC: 2.61 MIL/uL — ABNORMAL LOW (ref 4.22–5.81)
RDW: 14.4 % (ref 11.5–15.5)
WBC: 5.1 10*3/uL (ref 4.0–10.5)
nRBC: 0 % (ref 0.0–0.2)

## 2021-02-20 LAB — COMPREHENSIVE METABOLIC PANEL
ALT: 10 U/L (ref 0–44)
AST: 22 U/L (ref 15–41)
Albumin: 2.7 g/dL — ABNORMAL LOW (ref 3.5–5.0)
Alkaline Phosphatase: 36 U/L — ABNORMAL LOW (ref 38–126)
Anion gap: 10 (ref 5–15)
BUN: 14 mg/dL (ref 8–23)
CO2: 24 mmol/L (ref 22–32)
Calcium: 7.8 mg/dL — ABNORMAL LOW (ref 8.9–10.3)
Chloride: 102 mmol/L (ref 98–111)
Creatinine, Ser: 4.49 mg/dL — ABNORMAL HIGH (ref 0.61–1.24)
GFR, Estimated: 13 mL/min — ABNORMAL LOW (ref >60)
Glucose, Bld: 154 mg/dL — ABNORMAL HIGH (ref 70–99)
Potassium: 3.7 mmol/L (ref 3.5–5.1)
Sodium: 136 mmol/L (ref 135–145)
Total Bilirubin: 0.6 mg/dL (ref 0.3–1.2)
Total Protein: 6.3 g/dL — ABNORMAL LOW (ref 6.5–8.1)

## 2021-02-20 LAB — LACTIC ACID, PLASMA: Lactic Acid, Venous: 3.1 mmol/L (ref 0.5–1.9)

## 2021-02-20 LAB — RESP PANEL BY RT-PCR (FLU A&B, COVID) ARPGX2
Influenza A by PCR: NEGATIVE
Influenza B by PCR: NEGATIVE
SARS Coronavirus 2 by RT PCR: POSITIVE — AB

## 2021-02-20 LAB — PREPARE RBC (CROSSMATCH)

## 2021-02-20 LAB — TROPONIN I (HIGH SENSITIVITY)
Troponin I (High Sensitivity): 23 ng/L — ABNORMAL HIGH (ref <18)
Troponin I (High Sensitivity): 24 ng/L — ABNORMAL HIGH (ref <18)

## 2021-02-20 LAB — CBG MONITORING, ED: Glucose-Capillary: 141 mg/dL — ABNORMAL HIGH (ref 70–99)

## 2021-02-20 IMAGING — CT CT ABD-PELV W/O CM
2 of 4 series · 16 of 46 positions shown, 18 images · non-contrast
Comparison: [DATE]

CLINICAL DATA: Abdominal trauma syncope, fell and pulled
cholecystostomy drain

EXAM:
CT ABDOMEN AND PELVIS WITHOUT CONTRAST
TECHNIQUE: Multidetector CT imaging of the abdomen and pelvis was performed
following the standard protocol without IV contrast.

[Series 3: routine abd/pel wo · axial · 0.86mm/px · z∈[-919,-394]mm · 13 of 117 slices shown, 15 images]
[im 6/117  soft-tissue]
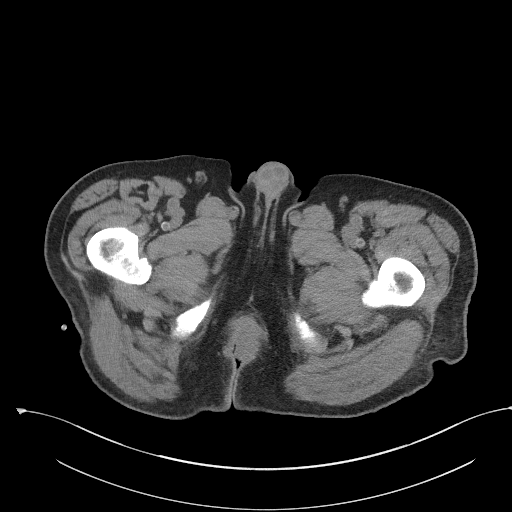
[im 6/117  bone]
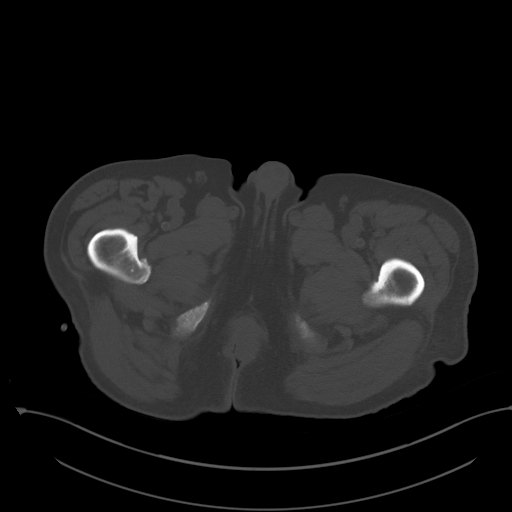
[im 16/117  soft-tissue]
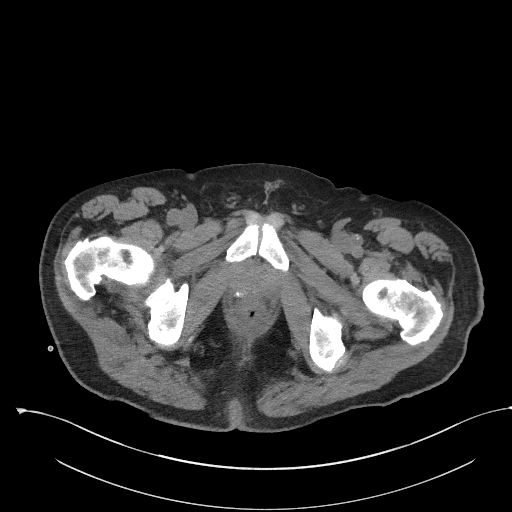
[im 27/117  soft-tissue]
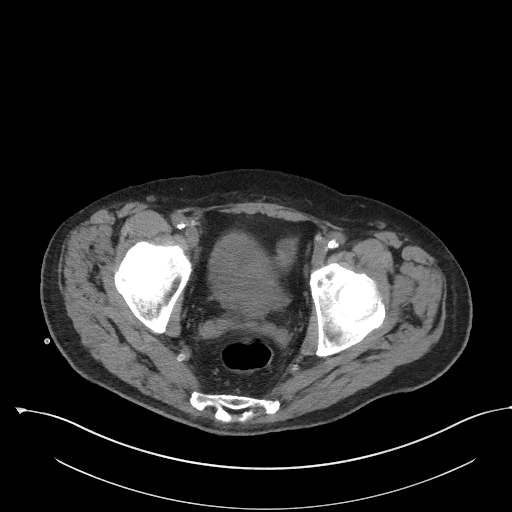
[im 32/117  soft-tissue]
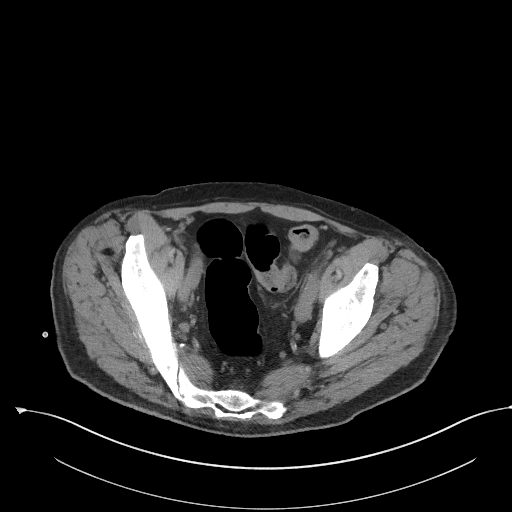
[im 43/117  soft-tissue]
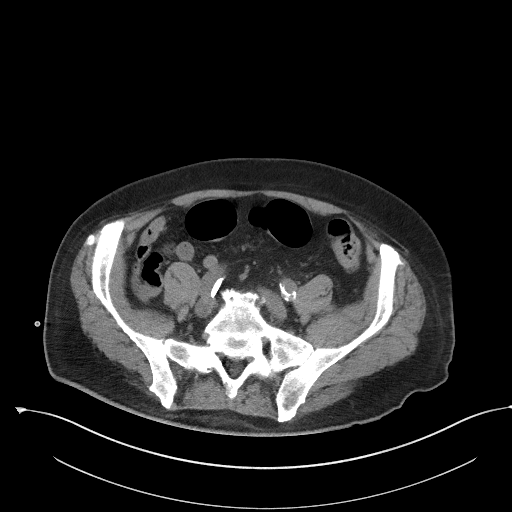
[im 48/117  soft-tissue]
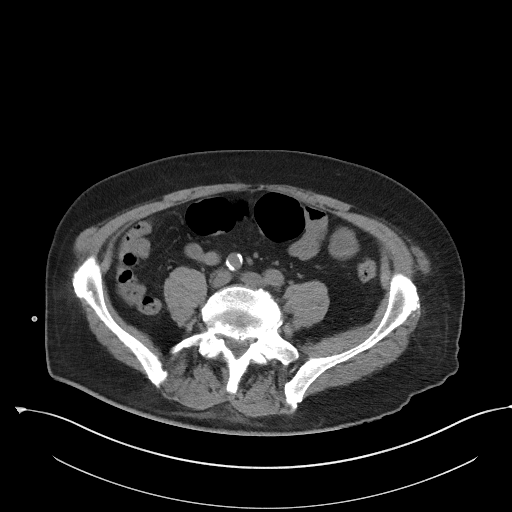
[im 59/117  soft-tissue]
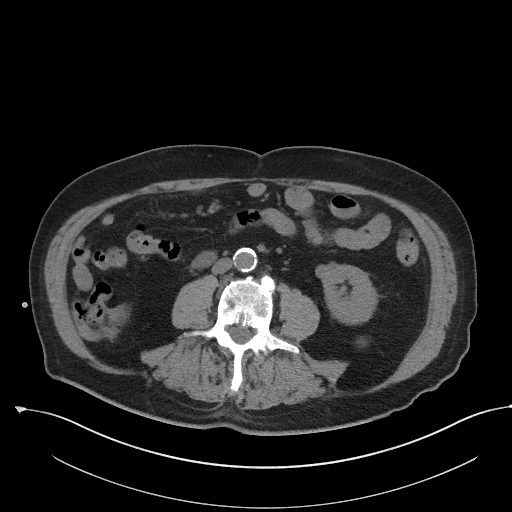
[im 69/117  soft-tissue]
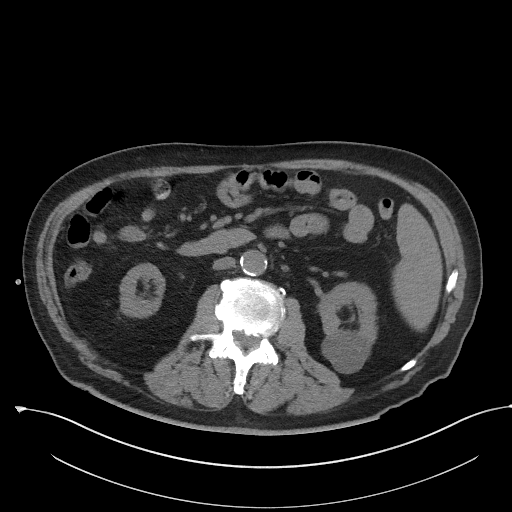
[im 74/117  soft-tissue]
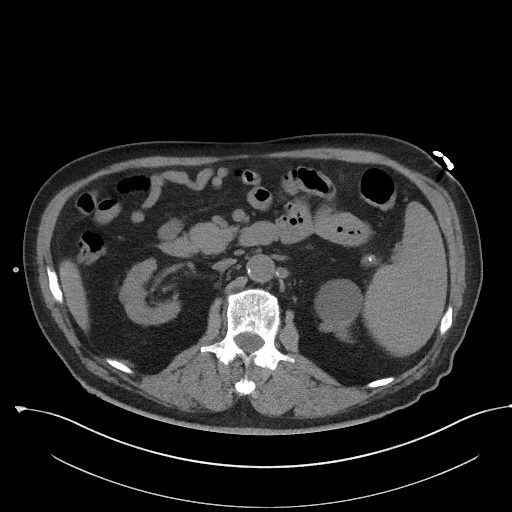
[im 74/117  bone]
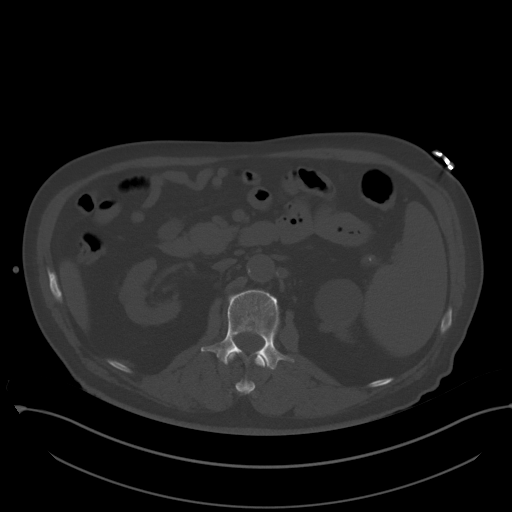
[im 85/117  soft-tissue]
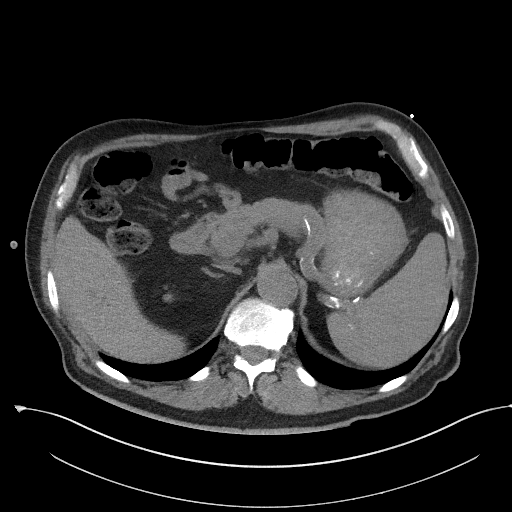
[im 90/117  soft-tissue]
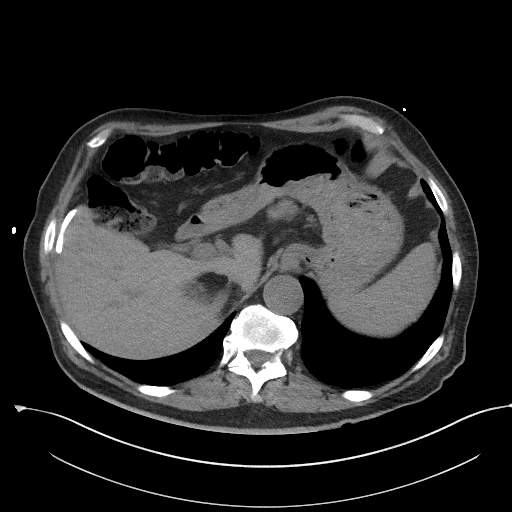
[im 101/117  soft-tissue]
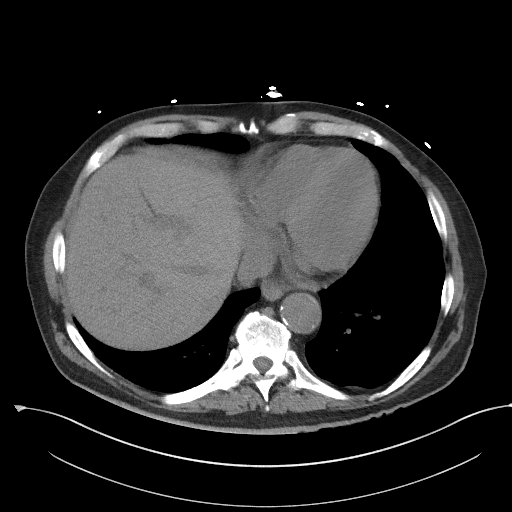
[im 111/117  soft-tissue]
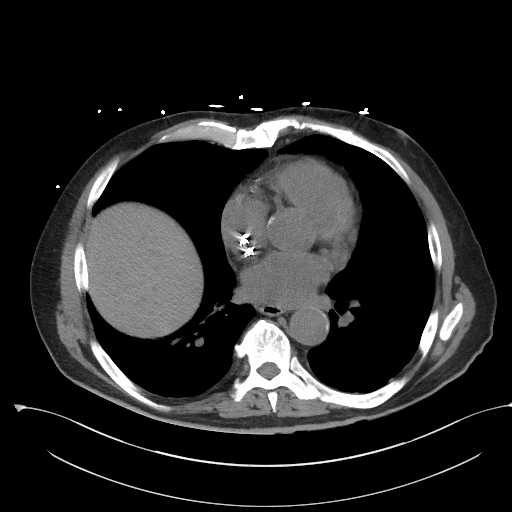

[Series 6: coronal st · coronal · 0.83mm/px · 3 of 100 slices shown]
[im 34/100  soft-tissue]
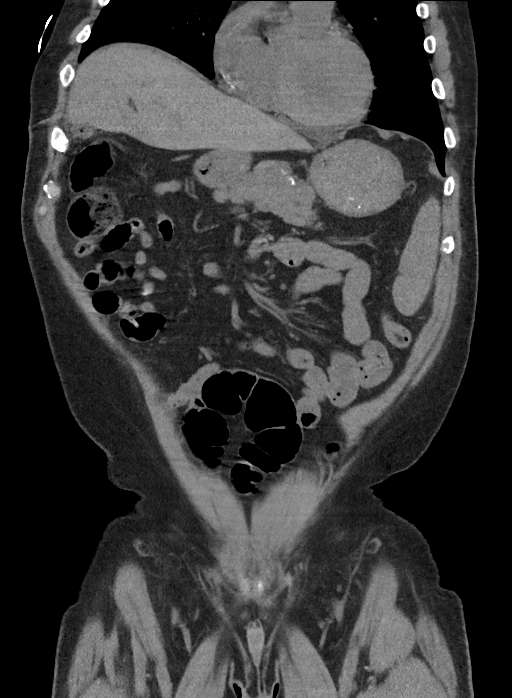
[im 45/100  soft-tissue]
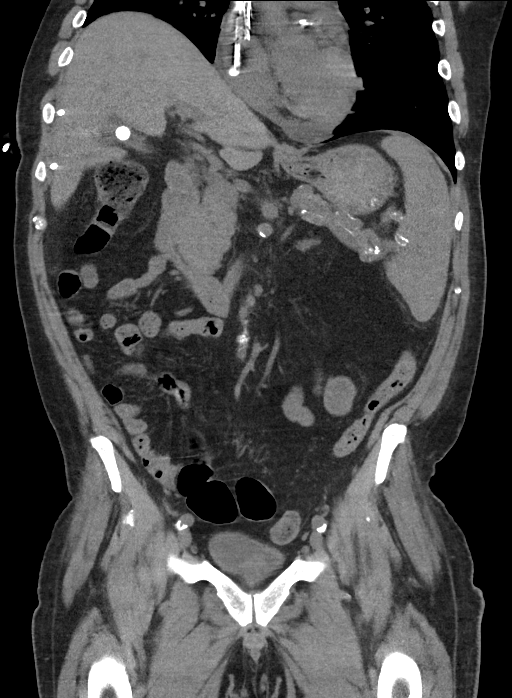
[im 56/100  soft-tissue]
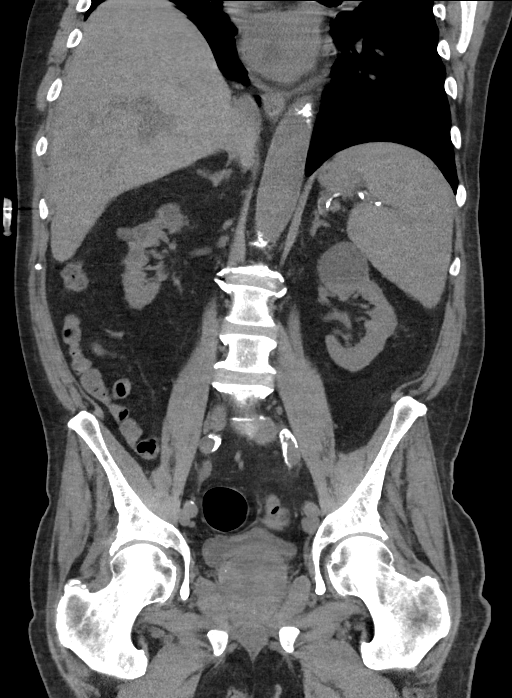

[16 of 46 positions shown; findings below may reference images not displayed]

FINDINGS: Lower chest: No acute abnormality.

Hepatobiliary: Cholecystostomy drain remains in place in a
decompressed gallbladder. 10 mm gallstone noted. No focal hepatic
abnormality.

Pancreas: No focal abnormality or ductal dilatation.

Spleen: No focal abnormality.  Normal size.

Adrenals/Urinary Tract: Bilateral renal cysts, the largest in the
upper pole of the left kidney measuring 4 cm. 6 mm nonobstructing
left lower pole renal stone. No hydronephrosis. Adrenal glands and
urinary bladder unremarkable.

Stomach/Bowel: Normal appendix. Stomach, large and small bowel
grossly unremarkable.

Vascular/Lymphatic: Aortic atherosclerosis. No evidence of aneurysm
or adenopathy.

Reproductive: Prostate enlargement

Other: No free fluid or free air.

Musculoskeletal: No acute bony abnormality.
IMPRESSION: No evidence of injury.

Cholecystostomy tube remains [REDACTED]ompressed gallbladder.
Cholelithiasis.

Left nephrolithiasis.  No hydronephrosis.

Aortic atherosclerosis.

Prostate enlargement.

## 2021-02-20 IMAGING — CT CT HEAD W/O CM
4 series · 16 of 47 positions shown, 18 images · non-contrast
Comparison: None.

CLINICAL DATA: Fall, head injury

EXAM:
CT HEAD WITHOUT CONTRAST
TECHNIQUE: Contiguous axial images were obtained from the base of the skull
through the vertex without intravenous contrast.

[Series 2: head bone · axial · 0.45mm/px · z∈[-62,-32]mm · 3 of 75 slices shown]
[im 8/75  bone]
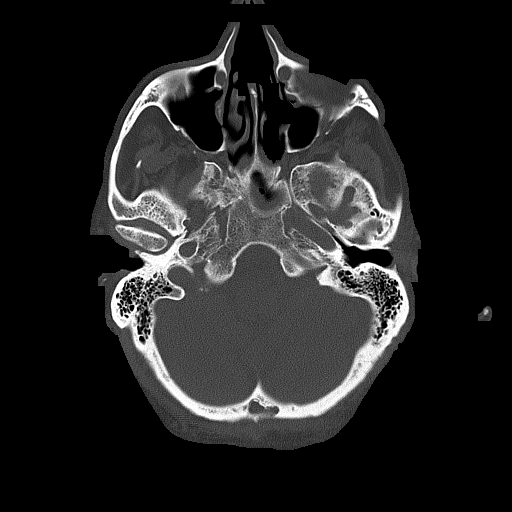
[im 15/75  bone]
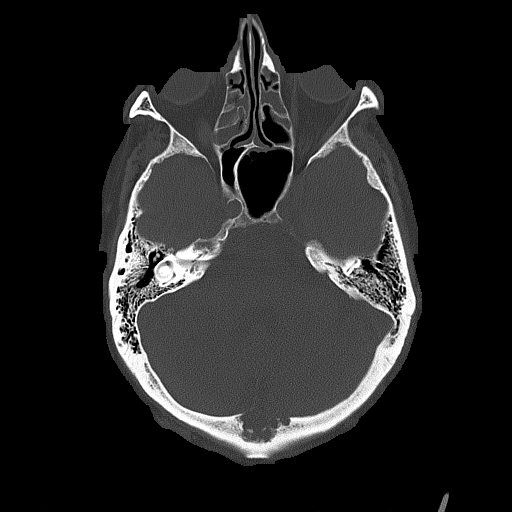
[im 23/75  bone]
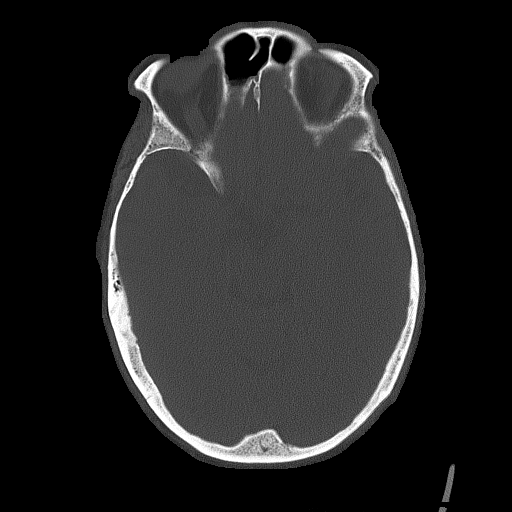

[Series 3: head wo · axial · 0.45mm/px · z∈[-61,+49]mm · 7 of 30 slices shown, 9 images]
[im 4/30  brain]
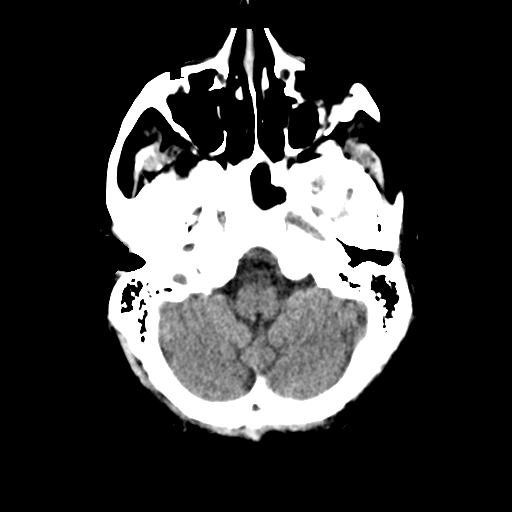
[im 4/30  bone]
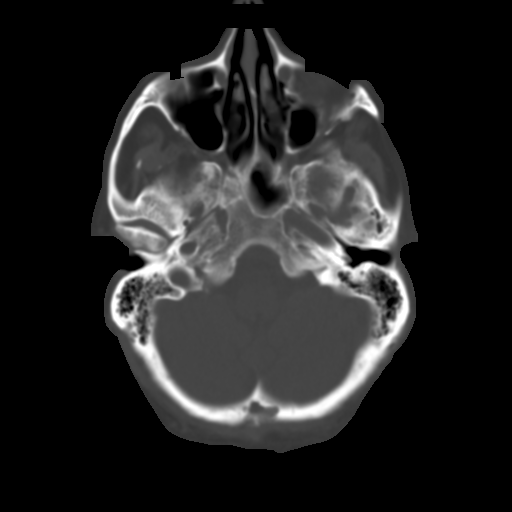
[im 8/30  brain]
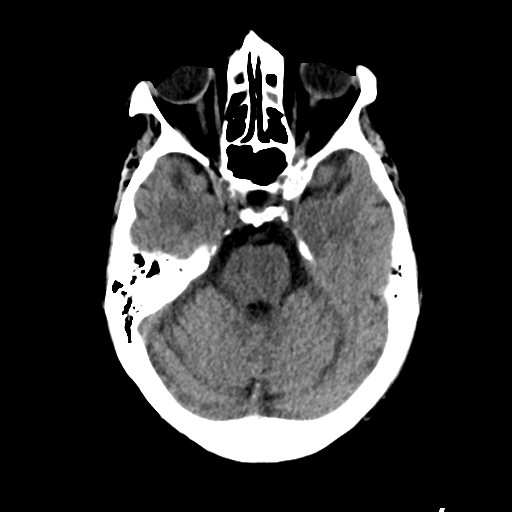
[im 11/30  brain]
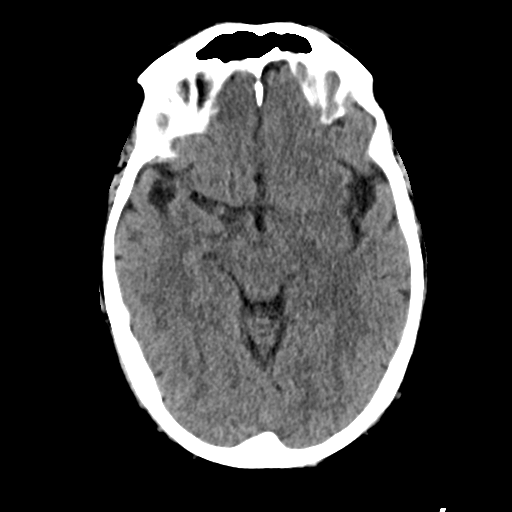
[im 15/30  brain]
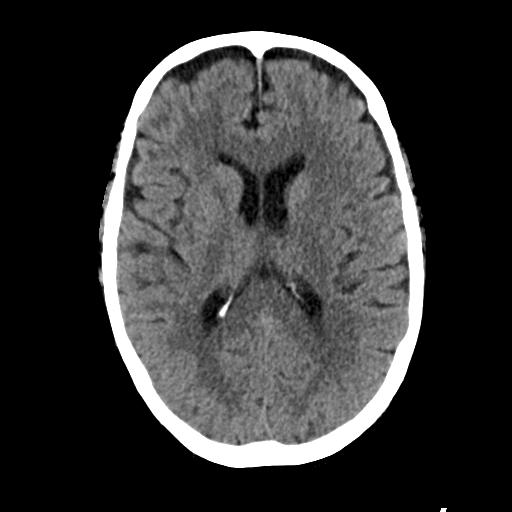
[im 19/30  brain]
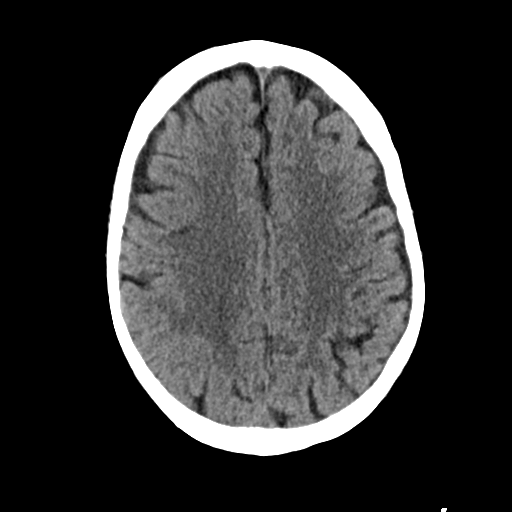
[im 19/30  bone]
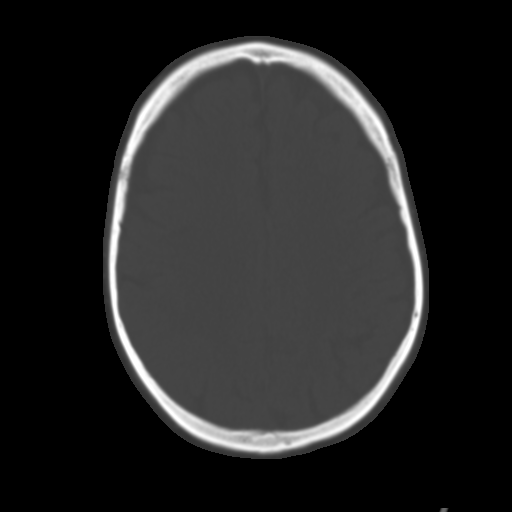
[im 22/30  brain]
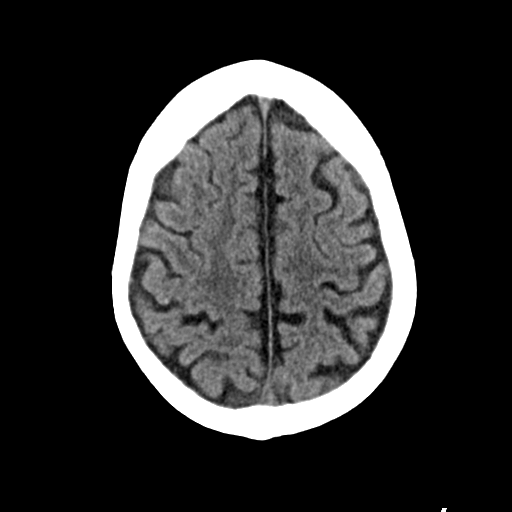
[im 26/30  brain]
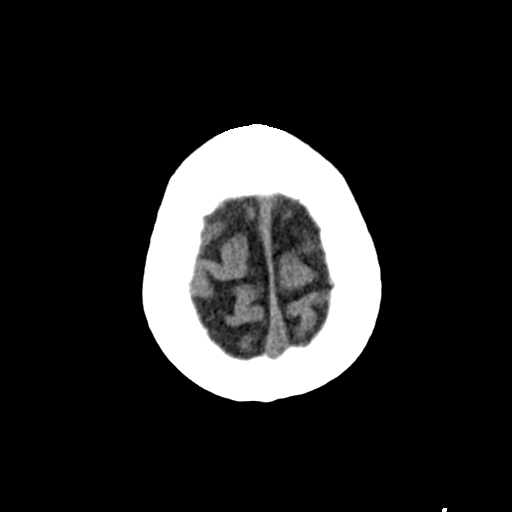

[Series 4: coronal soft tissue · coronal · 0.31mm/px · 3 of 74 slices shown]
[im 25/74  brain]
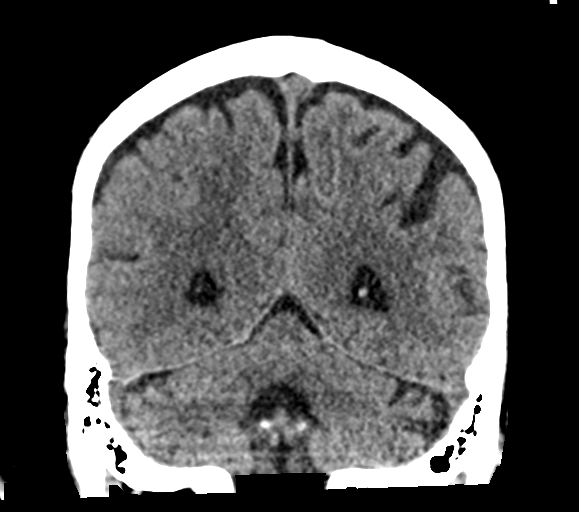
[im 33/74  brain]
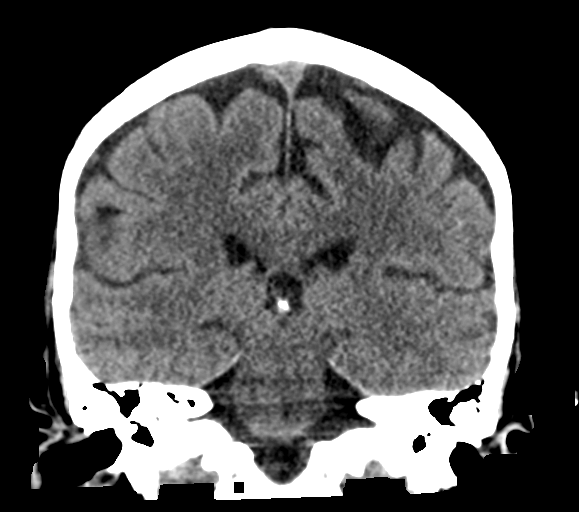
[im 41/74  brain]
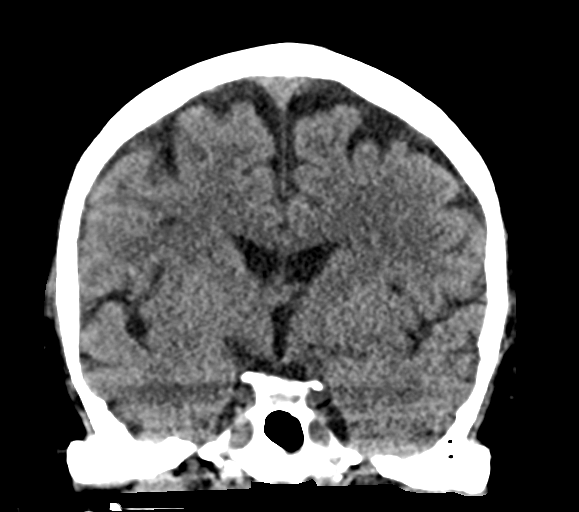

[Series 5: sagittal soft tissue · sagittal · 0.31mm/px · 3 of 59 slices shown]
[im 20/59  brain]
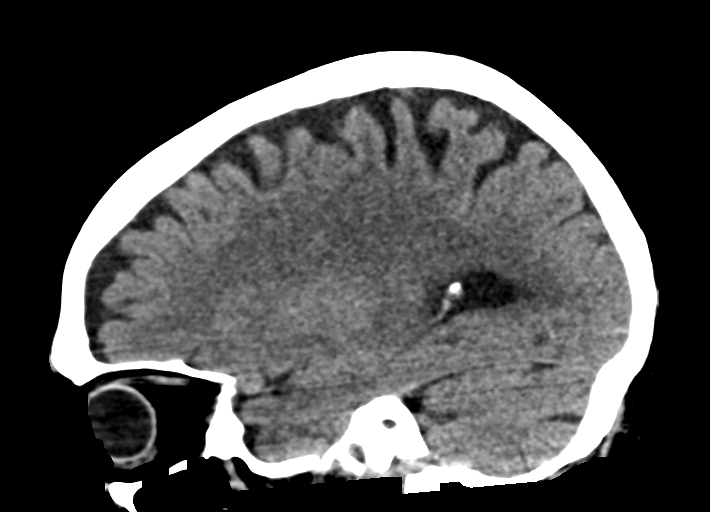
[im 30/59  brain]
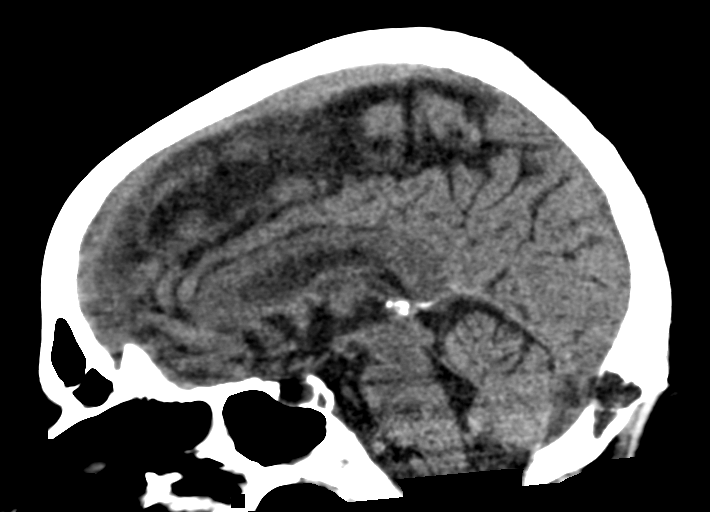
[im 39/59  brain]
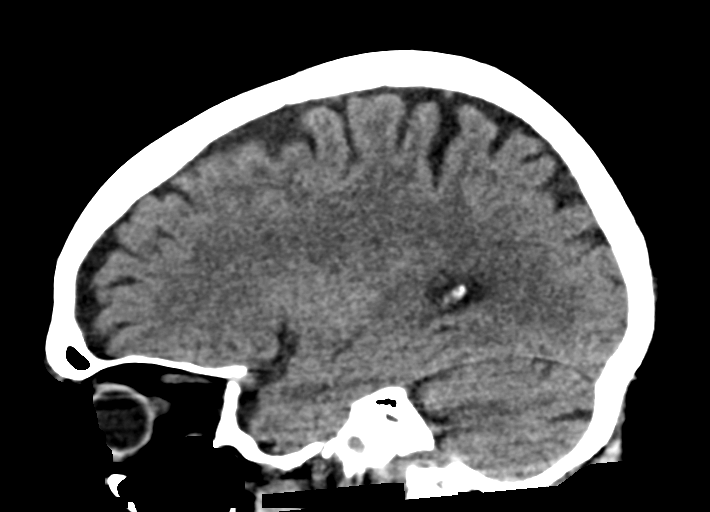

[16 of 47 positions shown; findings below may reference images not displayed]

FINDINGS: Brain: Small hypodensity in the anterior limb internal capsule on
the right likely of chronic ischemic insult. No other areas of
infarction. No acute hemorrhage or mass. Ventricle size normal.

Vascular: Negative for hyperdense vessel

Skull: Negative

Sinuses/Orbits: Mucosal edema paranasal sinuses. Bilateral cataract
extraction

Other: None
IMPRESSION: No acute abnormality. Small hypodensity right internal capsule most
consistent with chronic ischemia.

## 2021-02-20 IMAGING — CR DG CHEST 1V PORT
1 series · 2 of 2 positions shown · non-contrast
Comparison: [DATE]

CLINICAL DATA: Syncope

EXAM:
PORTABLE CHEST 1 VIEW

[Series 1: dg chest port 1 view · 0.14mm/px · 2 of 2 slices shown]
[im 1/2]
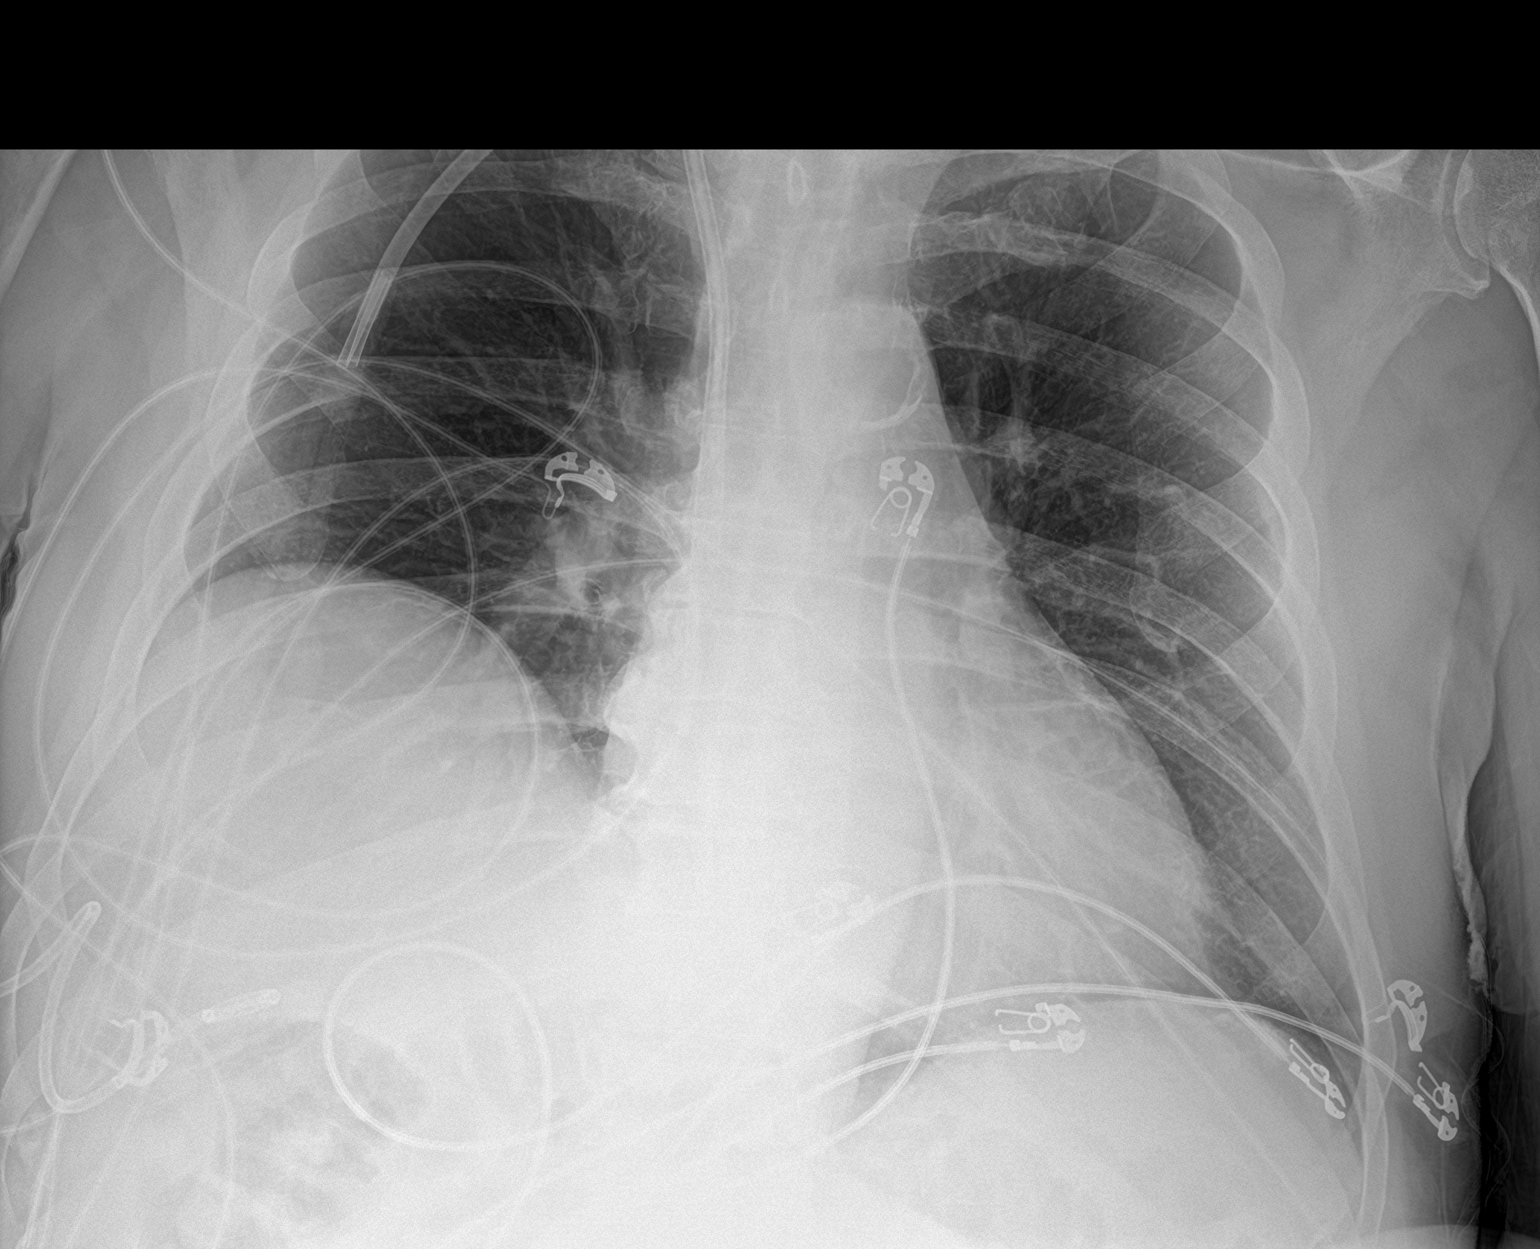
[im 2/2]
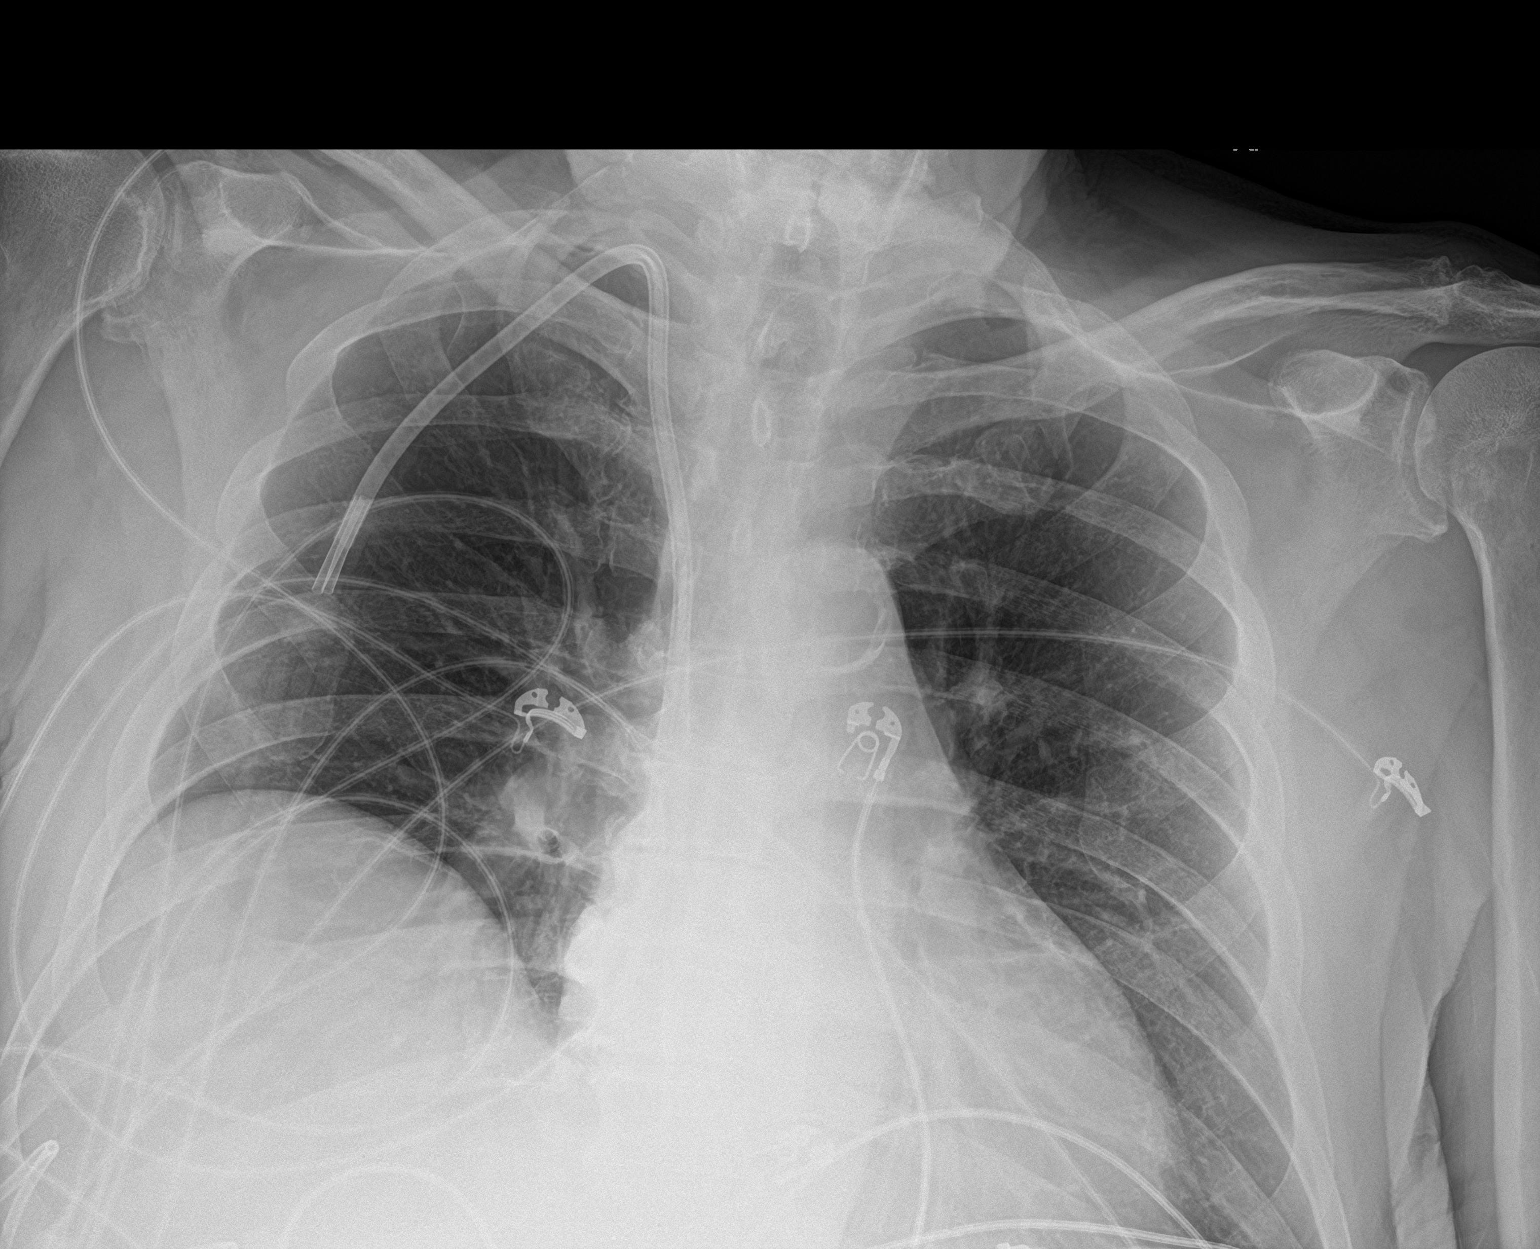

[2 of 2 positions shown; findings below may reference images not displayed]

FINDINGS: Interval placement of right jugular central venous catheter with the
tip in the lower SVC. No pneumothorax.

Mild elevation right hemidiaphragm unchanged. Minimal right lower
lobe atelectasis. Left lung clear. Negative for heart failure edema
or effusion.
IMPRESSION: Elevated right hemidiaphragm with mild right lower lobe atelectasis

Central venous catheter tip in the SVC without pneumothorax.

## 2021-02-20 MED ORDER — ACETAMINOPHEN 325 MG PO TABS: 650.0000 mg | ORAL_TABLET | Freq: Four times a day (QID) | ORAL | Status: AC | PRN

## 2021-02-20 MED ORDER — SODIUM CHLORIDE 0.9 % IV BOLUS
250.0000 mL | Freq: Once | INTRAVENOUS | Status: AC
  Administered 2021-02-20: 250 mL via INTRAVENOUS

## 2021-02-20 MED ORDER — INSULIN ASPART 100 UNIT/ML IJ SOLN
0.0000 [IU] | Freq: Three times a day (TID) | INTRAMUSCULAR | Status: DC
  Administered 2021-02-25: 1 [IU] via SUBCUTANEOUS
  Filled 2021-02-20: qty 1

## 2021-02-20 MED ORDER — ACETAMINOPHEN 650 MG RE SUPP
650.0000 mg | Freq: Four times a day (QID) | RECTAL | Status: AC | PRN
  Filled 2021-02-20: qty 1

## 2021-02-20 MED ORDER — ONDANSETRON HCL 4 MG/2ML IJ SOLN: 4.0000 mg | Freq: Four times a day (QID) | INTRAMUSCULAR | Status: DC | PRN

## 2021-02-20 MED ORDER — INSULIN ASPART 100 UNIT/ML IJ SOLN
0.0000 [IU] | Freq: Every day | INTRAMUSCULAR | Status: DC
  Filled 2021-02-20: qty 1

## 2021-02-20 MED ORDER — ONDANSETRON HCL 4 MG PO TABS: 4.0000 mg | ORAL_TABLET | Freq: Four times a day (QID) | ORAL | Status: DC | PRN

## 2021-02-20 MED ORDER — SODIUM CHLORIDE 0.9 % IV SOLN: 10.0000 mL/h | Freq: Once | INTRAVENOUS | Status: DC

## 2021-02-20 NOTE — ED Provider Notes (Signed)
[facility] Emergency Department Provider Note    Event Date/Time   First MD Initiated Contact with Patient 02/20/21 1957     (approximate)  I have reviewed the triage vital signs and the nursing notes.   HISTORY  Chief Complaint Fall    HPI Gregory Dixon is a 80 y.o. male extensive past medical history and complicated recent hospitalization with recently starting dialysis recent cholecystitis status post IR drain placement presents to the ER for syncopal event that occurred after he stood up quickly felt lightheaded and fell was able to stop himself as he fell to the ground.  Does not recall whether he hit his head is not complaining of any significant pain right now but feels weak and tired.  Does feel like he pulled on his IR [place] drain and that could be displaced.  Is still draining.  No hemorrhage.  He did have dialysis today.  Past Medical History:  Diagnosis Date   Actinic keratosis    Basal cell carcinoma 10/21/2017   left lateral neck, excised 02/09/2018   Basal cell carcinoma (BCC) 05/12/2019   right posterior ear, excised 05/31/2019   Blood transfusion without reported diagnosis    Diabetes mellitus without complication ([place])    No family history on file. Past Surgical History:  Procedure Laterality Date   COLONOSCOPY WITH PROPOFOL N/A 04/29/2019   Procedure: COLONOSCOPY WITH PROPOFOL;  Surgeon: Jonathon Bellows, MD;  Location: [facility] ENDOSCOPY;  Service: Gastroenterology;  Laterality: N/A;   DIALYSIS/PERMA CATHETER INSERTION Bilateral 01/28/2021   Procedure: DIALYSIS/PERMA CATHETER INSERTION;  Surgeon: Algernon Huxley, MD;  Location: [facility];  Service: Cardiovascular;  Laterality: Bilateral;   ESOPHAGOGASTRODUODENOSCOPY (EGD) WITH PROPOFOL N/A 04/29/2019   Procedure: ESOPHAGOGASTRODUODENOSCOPY (EGD) WITH PROPOFOL;  Surgeon: Jonathon Bellows, MD;  Location: [facility] ENDOSCOPY;  Service: Gastroenterology;  Laterality: N/A;   IR PERC CHOLECYSTOSTOMY   01/25/2021   Patient Active Problem List   Diagnosis Date Noted   Cholecystitis 01/24/2021   Weakness 01/23/2021   Elevated LFTs 01/23/2021   Transaminitis 01/23/2021   MGUS (monoclonal gammopathy of unknown significance)    Thrombocytopenia (HCC)    Acute anemia 11/23/2020   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   Chronic kidney disease 05/11/2019   Hyperlipidemia 05/11/2019   Hypertension, 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   Anemia due to vitamin B12 deficiency 05/05/2019   Benign prostatic hyperplasia 05/05/2019   Acute lower GI bleeding 04/27/2019   Acute blood loss anemia 04/27/2019   Acute kidney injury superimposed on CKD ([place]) 04/27/2019   Iron deficiency anemia due to chronic blood loss 04/27/2019   Type 2 diabetes mellitus with stage 4 chronic kidney disease, without long-term current use of insulin ([place]) 10/16/2015   Essential hypertension 10/16/2015   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 Authorizing Provider  apixaban (ELIQUIS) 2.5 MG TABS tablet Take 1 tablet (2.5 mg total) by mouth 2 (two) times daily. 01/31/21   Val Riles, MD  ascorbic acid (VITAMIN C) 500 MG tablet Take 1 tablet (500 mg total) by mouth daily. 02/01/21 03/03/21  Val Riles,  MD  carvedilol (COREG) 6.25 MG tablet Take 1 tablet (6.25 mg total) by mouth 2 (two) times daily with a meal. 01/31/21 03/02/21  Val Riles, MD  diltiazem (CARDIZEM CD) 360 MG 24 hr capsule Take 1 capsule (360 mg total) by mouth daily. 02/01/21 03/03/21  Val Riles, MD  fenofibrate 160  MG tablet Take 160 mg by mouth at bedtime.    [provider]  ferrous sulfate 325 (65 FE) MG tablet Take 325 mg by mouth 2 (two) times daily with a meal.    [provider]  insulin NPH-regular Human (70-30) 100 UNIT/ML injection Inject 10-30 Units into the skin 2 (two) times daily with a meal.    [provider]  isosorbide mononitrate (IMDUR) 30 MG 24 hr tablet Take 30 mg by mouth at bedtime. 03/14/19   [provider]  Multiple Vitamin (MULTIVITAMIN) capsule Take 1 capsule by mouth daily.    [provider]  pantoprazole (PROTONIX) 40 MG tablet Take 1 tablet (40 mg total) by mouth daily. Patient not taking: Reported on 02/13/2021 11/25/20 12/25/20  Loletha Grayer, MD  sodium chloride flush (NS) 0.9 % SOLN 5 mLs by Intracatheter route every 12 (twelve) hours. 01/26/21 02/25/21  Benjamine Sprague, DO  terazosin (HYTRIN) 10 MG capsule Take 10 mg by mouth at bedtime. 03/13/19   [provider]  Vitamin D, Ergocalciferol, (DRISDOL) 1.25 MG (50000 UNIT) CAPS capsule Take 1 capsule (50,000 Units total) by mouth every 7 (seven) days. 02/07/21 05/08/21  Val Riles, MD    Allergies Aspirin and Codeine    Social History Social History   Tobacco Use   Smoking status: Former   Smokeless tobacco: Never  Scientific laboratory technician Use: Never used  Substance Use Topics   Alcohol use: Not Currently    Comment: rare once a year   Drug use: Never    Review of Systems Patient denies headaches, rhinorrhea, blurry vision, numbness, shortness of breath, chest pain, edema, cough, abdominal pain, nausea, vomiting, diarrhea, dysuria, fevers, rashes or hallucinations unless otherwise stated above in HPI. ____________________________________________   PHYSICAL EXAM:  VITAL SIGNS: Vitals:   02/20/21 2100 02/20/21 2200  BP: (!) 99/50 (!) 112/49  Pulse: 82 80  Resp: (!) 25 19  Temp:  98.5 F (36.9 C)  SpO2: 100% 100%    Constitutional: Alert and oriented.   Eyes: Conjunctivae are normal.  Head: Atraumatic. Nose: No congestion/rhinnorhea. Mouth/Throat: Mucous membranes are moist.   Neck: No stridor. Painless ROM.  Cardiovascular: Normal rate, regular rhythm. Grossly normal heart sounds.  Good peripheral circulation. Respiratory: Normal respiratory effort.  No retractions. Lungs CTAB. Gastrointestinal: Soft and nontender. No distention. No abdominal bruits. No CVA tenderness. Genitourinary: multiple non bleeding hemorrhoids, no melena or hematochezia on exam Musculoskeletal: No lower extremity tenderness nor edema.  No joint effusions. Neurologic:  Normal speech and language. No gross focal neurologic deficits are appreciated. No facial droop Skin:  Skin is warm, dry and intact. No rash noted. Psychiatric: Mood and affect are normal. Speech and behavior are normal.  ____________________________________________   LABS (all labs ordered are listed, but only abnormal results are displayed)  Results for orders placed or performed during the hospital encounter of 02/20/21 (from the past 24 hour(s))  CBC     Status: Abnormal   Collection Time: 02/20/21  8:22 PM  Result Value Ref Range   WBC 5.1 4.0 - 10.5 K/uL   RBC 2.61 (L) 4.22 - 5.81 MIL/uL   Hemoglobin 7.2 (  L) 13.0 - 17.0 g/dL   HCT 23.4 (L) 39.0 - 52.0 %   MCV 89.7 80.0 - 100.0 fL   MCH 27.6 26.0 - 34.0 pg   MCHC 30.8 30.0 - 36.0 g/dL   RDW 14.4 11.5 - 15.5 %   Platelets 186 150 - 400 K/uL   nRBC 0.0 0.0 - 0.2 %  Lactic acid, plasma     Status: Abnormal   Collection Time: 02/20/21  8:22 PM  Result Value Ref Range   Lactic Acid, Venous 3.1 (HH) 0.5 - 1.9 mmol/L  Type and screen [place]     Status: None (Preliminary result)   Collection Time: 02/20/21  8:22 PM  Result Value Ref Range   ABO/RH(D) AB POS    Antibody Screen NEG    Sample Expiration      02/23/2021,2359 Performed at [facility], [address], [place], [place] [address]    Unit  Number [id number]    Blood Component Type RED CELLS,LR    Unit division 00    Status of Unit ALLOCATED    Transfusion Status OK TO TRANSFUSE    Crossmatch Result Compatible   Resp Panel by RT-PCR (Flu A&B, Covid) Nasopharyngeal Swab     Status: Abnormal   Collection Time: 02/20/21  8:22 PM   Specimen: Nasopharyngeal Swab; Nasopharyngeal(NP) swabs in vial transport medium  Result Value Ref Range   SARS Coronavirus 2 by RT PCR POSITIVE (A) NEGATIVE   Influenza A by PCR NEGATIVE NEGATIVE   Influenza B by PCR NEGATIVE NEGATIVE  Prepare RBC (crossmatch)     Status: None   Collection Time: 02/20/21  9:08 PM  Result Value Ref Range   Order Confirmation      ORDER PROCESSED BY BLOOD BANK Performed at [facility], [address] [place], [place] [address]    ____________________________________________  EKG My review and personal interpretation at Time: 20:05   Indication: weakness  Rate: 110  Rhythm: afib Axis: normal Other: normal intervals, non specific st abn, no stemi ____________________________________________  RADIOLOGY  I personally reviewed all radiographic images ordered to evaluate for the above acute complaints and reviewed radiology reports and findings.  These findings were personally discussed with the patient.  Please see medical record for radiology report.  ____________________________________________   PROCEDURES  Procedure(s) performed:  .Critical Care Performed by: Merlyn Lot, MD Authorized by: Merlyn Lot, MD   Critical care provider statement:    Critical care time (minutes):  35   Critical care was necessary to treat or prevent imminent or life-threatening deterioration of the following conditions:  Circulatory failure   Critical care was time spent personally by me on the following activities:  Ordering and performing treatments and interventions, ordering and review of laboratory studies, ordering and review of radiographic  studies, pulse oximetry, re-evaluation of patient's condition, review of old charts, obtaining history from patient or surrogate, examination of patient, evaluation of patient's response to treatment, discussions with primary provider, discussions with consultants and development of treatment plan with patient or surrogate    Critical Care performed: yes ____________________________________________   INITIAL IMPRESSION / [place] / ED COURSE  Pertinent labs & imaging results that were available during my care of the patient were reviewed by me and considered in my medical decision making (see chart for details).   DDX: dehydration, anemia, ugib, lgib, AoCD, tube displacement, sdh, iph  Gregory Dixon is a 80 y.o. who presents to the ED with  symptoms as described above.  Patient pale and frail appearing hypotensive in triage but improved once laying recumbent and brought back to ER bed.  He is mentating appropriately.  Blood work we sent for the but differential.  Will order CT imaging.  Clinical Course as of 02/20/21 2221  Wed Feb 20, 2021  2118 Patient's hemoglobin has continued to downtrend.  Patient states he has been having bright red blood per rectum for the past 5 to 6 days.  Does have large hemorrhoids also on review of his past medical record does have a history of colon polyps.  Is problem seems to worsen states he was recently started on Eliquis.  Does not seem consistent with upper GI bleed had endoscopy with normal results no sign of ulcers.  No melena on exam.  Will order blood transfusion as his blood pressure is borderline soft but his map is improving and he is mentating well.  Will order 1 unit.  Were discussed with hospitalist for admission. [PR]    Clinical Course User Index [PR] Merlyn Lot, MD    The patient was evaluated in Emergency Department today for the symptoms described in the history of present illness. He/she was evaluated in the context of the  global COVID-19 pandemic, which necessitated consideration that the patient might be at risk for infection with the SARS-CoV-2 virus that causes COVID-19. Institutional protocols and algorithms that pertain to the evaluation of patients at risk for COVID-19 are in a state of rapid change based on information released by regulatory bodies including the CDC and federal and state organizations. These policies and algorithms were followed during the patient's care in the ED.  As part of my medical decision making, I reviewed the following data within the [facility] notes reviewed and incorporated, Labs reviewed, notes from prior ED visits and [place] Controlled Substance Database   ____________________________________________   FINAL CLINICAL IMPRESSION(S) / ED DIAGNOSES  Final diagnoses:  Symptomatic anemia  ESRD on dialysis ([place])  Rectal bleeding      NEW MEDICATIONS STARTED DURING THIS VISIT:  New Prescriptions   No medications on file     Note:  This document was prepared using Dragon voice recognition software and may include unintentional dictation errors.    Merlyn Lot, MD 02/20/21 2221

## 2021-02-20 NOTE — H&P (Addendum)
History and Physical   Gregory Dixon MWU:[id number] DOB: Nov 20, 1940 DOA: 02/20/2021  PCP: Valera Castle, MD  Outpatient Specialists: Dr. Grayland Ormond, medical oncology Patient coming from:   I have personally briefly reviewed patient's old medical records in [place].  Chief Concern: Frequent fall, weakness  HPI: Gregory Dixon is a 80 y.o. male with medical history significant for MGUS, iron deficiency anemia, end-stage renal disease on hemodialysis via right upper anterior PermCath, cholecystitis status post IR percutaneous cholecystostomy placement, insulin-dependent diabetes mellitus, hypertension, GERD, atrial fibrillation, on anticoagulation, who presents emergency department for chief concerns of frequent falling, weakness.  At bedside he is able to tell me his name, his age, and he knows he is in the hospital.  H&P was provided fully by him.  He appears pale.  He was able to flex and extend his legs bilaterally without difficulty.  He were getting up from his lounge chair to get into his wheelchair and the next thing he knows he is looking up at his wife as he is laying on the floor.  Per patient, he states that his wife said that he was out for about 1 to 2 seconds.  He denies head trauma.  He reports that prior to October, he was able to ambulate however in the last few weeks he has been increasingly weak and not able to bear weight on both legs.  He denies any trauma.  He reports that since discharge from the hospital he has been very weak.  He reports that in the last week he has had lower GI bleed every time he uses the toilet for BM.  He reports that it is mostly with wiping.  However when prompted, he does endorse that he does have bright red blood dripping from his rectum into the toilet bowl with bowel movements.  He reports it similar to previous episodes of lower GI bleeding from hemorrhoids in the past.  He endorsed previous banding of internal hemorrhoids that did  resolve his lower GI bleed for a few years.  He reports that he has been dealing with intermittent lower GI bleeding for the last 20 to 30 years.  He further endorses that he has bilateral lower extremity neuropathy and does not feel sensation in his legs and this has been ongoing for the last 20 years.  Social history: He lives at home with his wife. He is a former tobacco user, quit about 35 years ago. He drinks one shot of alcohol once per year. He denies recreational drug use. 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 He does not know the brand.   ROS: Constitutional: no weight change, no fever ENT/Mouth: no sore throat, no rhinorrhea Eyes: no eye pain, no vision changes Cardiovascular: no chest pain, no dyspnea,  no edema, no palpitations Respiratory: no cough, no sputum, no wheezing Gastrointestinal: no nausea, no vomiting, no diarrhea, no constipation Genitourinary: no urinary incontinence, no dysuria, no hematuria Musculoskeletal: no arthralgias, no myalgias Skin: no skin lesions, no pruritus, Neuro: + weakness, + loss of consciousness, + syncope Psych: no anxiety, no depression, + decrease appetite Heme/Lymph: no bruising, no bleeding  ED Course: Discussed with emergency medicine provider, patient requiring hospitalization for chief concerns of acute on chronic anemia.  Vitals in the emergency department was remarkable for temperature of 98.3, respiration rate of 18, initial heart rate of 58, initial blood pressure  96/41 improved to 101/49, SPO2 of 99% on room air.  Labs in the emergency department was remarkable for sodium 136, potassium 3.7, chloride 102, bicarb 24, BUN of 14, serum creatinine of 4.49, nonfasting blood glucose 154.  WBC 5.1, hemoglobin 7.2, platelets 186.  In the emergency department patient was given 255 mL bolus, x2.  Assessment/Plan  Principal  Problem:   Acute blood loss anemia Active Problems:   Acute lower GI bleeding   Essential hypertension   Benign prostatic hyperplasia   Hyperlipidemia   Anemia associated with chronic renal failure   Acute anemia   MGUS (monoclonal gammopathy of unknown significance)   Weakness   # Acute blood loss anemia-presumed secondary to lower GI bleed # Weakness and frequent falls, presumed secondary to symptomatic anemia - EDP ordered 1 unit of packed red blood cells for transfusion - CBC in the a.m. - Nuclear medicine bleeding scan ordered - Admit to telemetry medical floor, observation, cardiac monitoring for 24 hours - A.m. team to consider GI consultation pending nuclear medicine bleeding scan - Of note patient was evaluated by Dr. Vicente Males in August 2022, and declined GI endoscopy and colonoscopy at that time.  He did not decline endoscopy and/or colonoscopy at this time however he would like to wait for the result of nuclear medicine scan  # Insulin-dependent diabetes mellitus-insulin SSI with at bedtime coverage ordered - Hemoglobin A1c was 5.6 on 01/25/2021  # End-stage renal disease on hemodialysis via permacath - Patient is status post dialysis/permacatheter insertion on 01/28/2021 - A.m. team to consult nephrology for routine dialysis - Epic order for nephrology has been placed  # Atrial fibrillation-patient is on Eliquis 2.5 mg p.o. twice daily - This has been held by myself due to acute anemia presumed secondary to lower GI bleed  # Hypertension-Home antihypertensive medications were not resumed due to hypotension from GI bleed - A.m. team to resume antihypertensive medications when appropriate  # DVT prophylaxis-pharmacologic DVT prophylaxis was not initiated by myself due to acute symptomatic anemia  Chart reviewed.   Hospitalization from 01/23/21 - 01/31/2021: Patient was admitted for cholecystitis, positive HIDA scan.  General surgery was consulted and recommended  percutaneous cholecystostomy.  Percutaneous cholecystotomy placed on 01/25/2021.  Patient was started on Zosyn.  Patient was transitioned to Cipro and Flagyl instead of Augmentin due to susceptibility.  Cipro and Flagyl was prescribed to complete 7-day course.  General surgery signed off on 10/22.  Patient also developed new onset atrial fibrillation he is status post Cardizem IV infusion.  Cardiology was consulted.  Eliquis 2.5 mg p.o. twice daily for stroke prophylaxis was also started at this time.  He was started on diltiazem 360 milligrams 24-hour capsule.  Hospitalization from 11/23/2020 to 11/25/2020: Patient was admitted for chronic blood loss anemia with iron deficiency anemia.  Per discharge summary, patient refused GI work-up in the hospital.  Patient was seen by Dr. Vicente Males who saw him in consultation and recommended endoscopy and colonoscopy which patient declined.  Per discharge summary, on admission patient's hemoglobin was noted to be 5.7 and transfused 3 units of packed red blood cells during the hospital course.  Patient also received IV iron.  Hemoglobin on discharge was 7.9.  Patient was also noted to have acute kidney injury on CKD stage IV.  Nephrology was consulted and deemed no acute dialysis at this time.  DVT prophylaxis: TED hose Code Status: DNR/DNI Diet: Heart healthy Family Communication: No Disposition Plan: Pending clinical course and nuclear medicine panel, anticipate  more than 2-day stay Consults called: None at this time Admission status: Telemetry medical floor, observation  Past Medical History:  Diagnosis Date   Actinic keratosis    Basal cell carcinoma 10/21/2017   left lateral neck, excised 02/09/2018   Basal cell carcinoma (BCC) 05/12/2019   right posterior ear, excised 05/31/2019   Blood transfusion without reported diagnosis    Diabetes mellitus without complication [facility])    Past Surgical History:  Procedure Laterality Date   COLONOSCOPY WITH PROPOFOL N/A  04/29/2019   Procedure: COLONOSCOPY WITH PROPOFOL;  Surgeon: Jonathon Bellows, MD;  Location: [facility] ENDOSCOPY;  Service: Gastroenterology;  Laterality: N/A;   DIALYSIS/PERMA CATHETER INSERTION Bilateral 01/28/2021   Procedure: DIALYSIS/PERMA CATHETER INSERTION;  Surgeon: Algernon Huxley, MD;  Location: [place] [facility] CV LAB;  Service: Cardiovascular;  Laterality: Bilateral;   ESOPHAGOGASTRODUODENOSCOPY (EGD) WITH PROPOFOL N/A 04/29/2019   Procedure: ESOPHAGOGASTRODUODENOSCOPY (EGD) WITH PROPOFOL;  Surgeon: Jonathon Bellows, MD;  Location: [facility] ENDOSCOPY;  Service: Gastroenterology;  Laterality: N/A;   IR PERC CHOLECYSTOSTOMY  01/25/2021   Social History:  reports that he has quit smoking. He has never used smokeless tobacco. He reports that he does not currently use alcohol. He reports that he does not use drugs.  Allergies  Allergen Reactions   Aspirin Other (See Comments)    GI bleed   Codeine Nausea Only and Nausea And Vomiting   Family History  Problem Relation Age of Onset   Hypertension Father    Family history: Family history reviewed and not pertinent  Prior to Admission medications   Medication Sig Start Date End Date Taking? Authorizing Provider  apixaban (ELIQUIS) 2.5 MG TABS tablet Take 1 tablet (2.5 mg total) by mouth 2 (two) times daily. 01/31/21   Val Riles, MD  ascorbic acid (VITAMIN C) 500 MG tablet Take 1 tablet (500 mg total) by mouth daily. 02/01/21 03/03/21  Val Riles, MD  carvedilol (COREG) 6.25 MG tablet Take 1 tablet (6.25 mg total) by mouth 2 (two) times daily with a meal. 01/31/21 03/02/21  Val Riles, MD  diltiazem (CARDIZEM CD) 360 MG 24 hr capsule Take 1 capsule (360 mg total) by mouth daily. 02/01/21 03/03/21  Val Riles, MD  fenofibrate 160 MG tablet Take 160 mg by mouth at bedtime.    [provider]  ferrous sulfate 325 (65 FE) MG tablet Take 325 mg by mouth 2 (two) times daily with a meal.    [provider]  insulin NPH-regular Human  (70-30) 100 UNIT/ML injection Inject 10-30 Units into the skin 2 (two) times daily with a meal.    [provider]  isosorbide mononitrate (IMDUR) 30 MG 24 hr tablet Take 30 mg by mouth at bedtime. 03/14/19   [provider]  Multiple Vitamin (MULTIVITAMIN) capsule Take 1 capsule by mouth daily.    [provider]  pantoprazole (PROTONIX) 40 MG tablet Take 1 tablet (40 mg total) by mouth daily. Patient not taking: Reported on 02/13/2021 11/25/20 12/25/20  Loletha Grayer, MD  sodium chloride flush (NS) 0.9 % SOLN 5 mLs by Intracatheter route every 12 (twelve) hours. 01/26/21 02/25/21  Benjamine Sprague, DO  terazosin (HYTRIN) 10 MG capsule Take 10 mg by mouth at bedtime. 03/13/19   [provider]  Vitamin D, Ergocalciferol, (DRISDOL) 1.25 MG (50000 UNIT) CAPS capsule Take 1 capsule (50,000 Units total) by mouth every 7 (seven) days. 02/07/21 05/08/21  Val Riles, MD   Physical Exam: Vitals:   02/20/21 2200 02/20/21 2230 02/20/21 2248 02/20/21 2303  BP: (!) 112/49 (!) 113/50  (!) 121/55  Pulse: 80 74 74 77  Resp: 19 (!) 22 (!) 25 15  Temp: 98.5 F (36.9 C) 99 F (37.2 C)  98.4 F (36.9 C)  TempSrc: Oral Oral  Oral  SpO2: 100% 100% 100% 100%  Weight:      Height:       Constitutional: appears age-appropriate, frail, NAD, calm, comfortable Eyes: PERRL, lids and conjunctivae normal ENMT: Mucous membranes are moist. Posterior pharynx clear of any exudate or lesions. Age-appropriate dentition. Hearing appropriate Neck: normal, supple, no masses, no thyromegaly Respiratory: clear to auscultation bilaterally, no wheezing, no crackles. Normal respiratory effort. No accessory muscle use.  Cardiovascular: Irregular rate and rhythm, no murmurs / rubs / gallops.  Positive for 1+ right lower extremity pitting edema that is chronic per patient. 2+ pedal pulses. No carotid bruits.  Abdomen: no tenderness, no masses palpated, no hepatosplenomegaly. Bowel sounds positive.   Right upper quadrant abdomen with percutaneous drain in place.  Percutaneous cholecystotomy bag is positive for approximately 10 mL of dark green bile fluid. Musculoskeletal: no clubbing / cyanosis. No joint deformity upper and lower extremities. Good ROM, no contractures, no atrophy. Normal muscle tone.  Skin: Pale, no rashes, lesions, ulcers. No induration Neurologic: Sensation intact. Strength 5/5 in all 4.  Psychiatric: Normal judgment and insight. Alert and oriented x 3. Normal mood.   EKG: independently reviewed, showing atrial fibrillation with rate of 110, QTc 454  Chest x-ray on Admission: I personally reviewed and I agree with radiologist reading as below.  CT ABDOMEN PELVIS WO CONTRAST  Result Date: 02/20/2021 CLINICAL DATA:  Abdominal trauma syncope, fell and pulled cholecystostomy drain EXAM: CT ABDOMEN AND PELVIS WITHOUT CONTRAST TECHNIQUE: Multidetector CT imaging of the abdomen and pelvis was performed following the standard protocol without IV contrast. COMPARISON:  01/23/2021 FINDINGS: Lower chest: No acute abnormality. Hepatobiliary: Cholecystostomy drain remains in place in a decompressed gallbladder. 10 mm gallstone noted. No focal hepatic abnormality. Pancreas: No focal abnormality or ductal dilatation. Spleen: No focal abnormality.  Normal size. Adrenals/Urinary Tract: Bilateral renal cysts, the largest in the upper pole of the left kidney measuring 4 cm. 6 mm nonobstructing left lower pole renal stone. No hydronephrosis. Adrenal glands and urinary bladder unremarkable. Stomach/Bowel: Normal appendix. Stomach, large and small bowel grossly unremarkable. Vascular/Lymphatic: Aortic atherosclerosis. No evidence of aneurysm or adenopathy. Reproductive: Prostate enlargement Other: No free fluid or free air. Musculoskeletal: No acute bony abnormality. IMPRESSION: No evidence of injury. Cholecystostomy tube remains in decompressed gallbladder. Cholelithiasis. Left nephrolithiasis.  No  hydronephrosis. Aortic atherosclerosis. Prostate enlargement. Electronically Signed   By: Rolm Baptise M.D.   On: 02/20/2021 20:44   CT HEAD WO CONTRAST (5MM)  Result Date: 02/20/2021 CLINICAL DATA:  Fall, head injury EXAM: CT HEAD WITHOUT CONTRAST TECHNIQUE: Contiguous axial images were obtained from the base of the skull through the vertex without intravenous contrast. COMPARISON:  None. FINDINGS: Brain: Small hypodensity in the anterior limb internal capsule on the right likely of chronic ischemic insult. No other areas of infarction. No acute hemorrhage or mass. Ventricle size normal. Vascular: Negative for hyperdense vessel Skull: Negative Sinuses/Orbits: Mucosal edema paranasal sinuses. Bilateral cataract extraction Other: None IMPRESSION: No acute abnormality. Small hypodensity right internal capsule most consistent with chronic ischemia. Electronically Signed   By: Franchot Gallo M.D.   On: 02/20/2021 20:40   DG Chest Portable 1 View  Result Date: 02/20/2021 CLINICAL DATA:  Syncope EXAM: PORTABLE CHEST 1 VIEW COMPARISON:  01/23/2021  FINDINGS: Interval placement of right jugular central venous catheter with the tip in the lower SVC. No pneumothorax. Mild elevation right hemidiaphragm unchanged. Minimal right lower lobe atelectasis. Left lung clear. Negative for heart failure edema or effusion. IMPRESSION: Elevated right hemidiaphragm with mild right lower lobe atelectasis Central venous catheter tip in the SVC without pneumothorax. Electronically Signed   By: Franchot Gallo M.D.   On: 02/20/2021 20:36    Labs on Admission: I have personally reviewed following labs  CBC: Recent Labs  Lab 02/20/21 2022  WBC 5.1  HGB 7.2*  HCT 23.4*  MCV 89.7  PLT 536   Basic Metabolic Panel: Recent Labs  Lab 02/20/21 2022  NA 136  K 3.7  CL 102  CO2 24  GLUCOSE 154*  BUN 14  CREATININE 4.49*  CALCIUM 7.8*   GFR: Estimated Creatinine Clearance: 17.2 mL/min (A) (by C-G formula based on SCr of  4.49 mg/dL (H)).  Liver Function Tests: Recent Labs  Lab 02/20/21 2022  AST 22  ALT 10  ALKPHOS 36*  BILITOT 0.6  PROT 6.3*  ALBUMIN 2.7*   Urine analysis:    Component Value Date/Time   COLORURINE YELLOW (A) 04/27/2019 2100   APPEARANCEUR Clear 06/28/2020 0900   LABSPEC 1.011 04/27/2019 2100   PHURINE 6.0 04/27/2019 2100   GLUCOSEU Trace (A) 06/28/2020 0900   HGBUR NEGATIVE 04/27/2019 2100   BILIRUBINUR Negative 06/28/2020 0900   KETONESUR NEGATIVE 04/27/2019 2100   PROTEINUR 3+ (A) 06/28/2020 0900   PROTEINUR >=300 (A) 04/27/2019 2100   NITRITE Negative 06/28/2020 0900   NITRITE NEGATIVE 04/27/2019 2100   LEUKOCYTESUR Negative 06/28/2020 0900   LEUKOCYTESUR NEGATIVE 04/27/2019 2100   Dr. Tobie Poet [facility]  If 7PM-7AM, please contact overnight-coverage provider If 7AM-7PM, please contact day coverage provider [email or web address]  02/20/2021, 11:47 PM

## 2021-02-20 NOTE — ED Triage Notes (Signed)
Pt presents to the ED after a fall. Pt denies any LOC or head injury. States he caught himself with his hand. Pt has a drain from gallbladder Sx recently and states that his arm pulled on the drain when he fell. Pt denies pain but endorses nausea, and fatigue.

## 2021-02-21 ENCOUNTER — Inpatient Hospital Stay: Payer: Medicare Other | Admitting: Surgery

## 2021-02-21 ENCOUNTER — Encounter: Payer: Self-pay | Admitting: Internal Medicine

## 2021-02-21 DIAGNOSIS — N400 Enlarged prostate without lower urinary tract symptoms: Secondary | ICD-10-CM | POA: Diagnosis present

## 2021-02-21 DIAGNOSIS — K922 Gastrointestinal hemorrhage, unspecified: Secondary | ICD-10-CM | POA: Diagnosis not present

## 2021-02-21 DIAGNOSIS — K625 Hemorrhage of anus and rectum: Secondary | ICD-10-CM

## 2021-02-21 DIAGNOSIS — S0990XA Unspecified injury of head, initial encounter: Secondary | ICD-10-CM | POA: Diagnosis present

## 2021-02-21 DIAGNOSIS — R296 Repeated falls: Secondary | ICD-10-CM | POA: Diagnosis present

## 2021-02-21 DIAGNOSIS — I120 Hypertensive chronic kidney disease with stage 5 chronic kidney disease or end stage renal disease: Secondary | ICD-10-CM | POA: Diagnosis present

## 2021-02-21 DIAGNOSIS — W19XXXA Unspecified fall, initial encounter: Secondary | ICD-10-CM | POA: Diagnosis present

## 2021-02-21 DIAGNOSIS — Z66 Do not resuscitate: Secondary | ICD-10-CM | POA: Diagnosis present

## 2021-02-21 DIAGNOSIS — R000 Tachycardia, unspecified: Secondary | ICD-10-CM

## 2021-02-21 DIAGNOSIS — N2581 Secondary hyperparathyroidism of renal origin: Secondary | ICD-10-CM | POA: Diagnosis present

## 2021-02-21 DIAGNOSIS — K641 Second degree hemorrhoids: Secondary | ICD-10-CM | POA: Diagnosis present

## 2021-02-21 DIAGNOSIS — K635 Polyp of colon: Secondary | ICD-10-CM | POA: Diagnosis present

## 2021-02-21 DIAGNOSIS — D175 Benign lipomatous neoplasm of intra-abdominal organs: Secondary | ICD-10-CM | POA: Diagnosis present

## 2021-02-21 DIAGNOSIS — E43 Unspecified severe protein-calorie malnutrition: Secondary | ICD-10-CM | POA: Diagnosis present

## 2021-02-21 DIAGNOSIS — D649 Anemia, unspecified: Secondary | ICD-10-CM | POA: Diagnosis present

## 2021-02-21 DIAGNOSIS — D62 Acute posthemorrhagic anemia: Secondary | ICD-10-CM | POA: Diagnosis present

## 2021-02-21 DIAGNOSIS — N186 End stage renal disease: Secondary | ICD-10-CM | POA: Diagnosis present

## 2021-02-21 DIAGNOSIS — D631 Anemia in chronic kidney disease: Secondary | ICD-10-CM | POA: Diagnosis present

## 2021-02-21 DIAGNOSIS — Z992 Dependence on renal dialysis: Secondary | ICD-10-CM | POA: Diagnosis not present

## 2021-02-21 DIAGNOSIS — K449 Diaphragmatic hernia without obstruction or gangrene: Secondary | ICD-10-CM | POA: Diagnosis present

## 2021-02-21 DIAGNOSIS — I4821 Permanent atrial fibrillation: Secondary | ICD-10-CM | POA: Diagnosis present

## 2021-02-21 DIAGNOSIS — E1122 Type 2 diabetes mellitus with diabetic chronic kidney disease: Secondary | ICD-10-CM | POA: Diagnosis present

## 2021-02-21 DIAGNOSIS — U071 COVID-19: Secondary | ICD-10-CM | POA: Diagnosis present

## 2021-02-21 DIAGNOSIS — I959 Hypotension, unspecified: Secondary | ICD-10-CM | POA: Diagnosis present

## 2021-02-21 DIAGNOSIS — D472 Monoclonal gammopathy: Secondary | ICD-10-CM | POA: Diagnosis present

## 2021-02-21 DIAGNOSIS — E785 Hyperlipidemia, unspecified: Secondary | ICD-10-CM | POA: Diagnosis present

## 2021-02-21 DIAGNOSIS — K921 Melena: Secondary | ICD-10-CM | POA: Diagnosis present

## 2021-02-21 DIAGNOSIS — E1141 Type 2 diabetes mellitus with diabetic mononeuropathy: Secondary | ICD-10-CM | POA: Diagnosis present

## 2021-02-21 DIAGNOSIS — N179 Acute kidney failure, unspecified: Secondary | ICD-10-CM | POA: Diagnosis present

## 2021-02-21 LAB — BASIC METABOLIC PANEL
Anion gap: 10 (ref 5–15)
BUN: 17 mg/dL (ref 8–23)
CO2: 26 mmol/L (ref 22–32)
Calcium: 8.2 mg/dL — ABNORMAL LOW (ref 8.9–10.3)
Chloride: 101 mmol/L (ref 98–111)
Creatinine, Ser: 5.54 mg/dL — ABNORMAL HIGH (ref 0.61–1.24)
GFR, Estimated: 10 mL/min — ABNORMAL LOW (ref >60)
Glucose, Bld: 99 mg/dL (ref 70–99)
Potassium: 4 mmol/L (ref 3.5–5.1)
Sodium: 137 mmol/L (ref 135–145)

## 2021-02-21 LAB — CBC
HCT: 24.1 % — ABNORMAL LOW (ref 39.0–52.0)
HCT: 23.4 % — ABNORMAL LOW (ref 39.0–52.0)
Hemoglobin: 7.6 g/dL — ABNORMAL LOW (ref 13.0–17.0)
Hemoglobin: 7.4 g/dL — ABNORMAL LOW (ref 13.0–17.0)
MCH: 28.3 pg (ref 26.0–34.0)
MCH: 28 pg (ref 26.0–34.0)
MCHC: 31.5 g/dL (ref 30.0–36.0)
MCHC: 31.6 g/dL (ref 30.0–36.0)
MCV: 89.6 fL (ref 80.0–100.0)
MCV: 88.6 fL (ref 80.0–100.0)
Platelets: 174 10*3/uL (ref 150–400)
Platelets: 175 10*3/uL (ref 150–400)
RBC: 2.69 MIL/uL — ABNORMAL LOW (ref 4.22–5.81)
RBC: 2.64 MIL/uL — ABNORMAL LOW (ref 4.22–5.81)
RDW: 14.1 % (ref 11.5–15.5)
RDW: 14.3 % (ref 11.5–15.5)
WBC: 4.9 10*3/uL (ref 4.0–10.5)
WBC: 5.1 10*3/uL (ref 4.0–10.5)
nRBC: 0 % (ref 0.0–0.2)
nRBC: 0 % (ref 0.0–0.2)

## 2021-02-21 LAB — HEMOGLOBIN AND HEMATOCRIT, BLOOD
HCT: 23 % — ABNORMAL LOW (ref 39.0–52.0)
Hemoglobin: 7.3 g/dL — ABNORMAL LOW (ref 13.0–17.0)

## 2021-02-21 LAB — GLUCOSE, CAPILLARY: Glucose-Capillary: 158 mg/dL — ABNORMAL HIGH (ref 70–99)

## 2021-02-21 LAB — CBG MONITORING, ED
Glucose-Capillary: 91 mg/dL (ref 70–99)
Glucose-Capillary: 148 mg/dL — ABNORMAL HIGH (ref 70–99)
Glucose-Capillary: 101 mg/dL — ABNORMAL HIGH (ref 70–99)

## 2021-02-21 LAB — LACTIC ACID, PLASMA: Lactic Acid, Venous: 1.6 mmol/L (ref 0.5–1.9)

## 2021-02-21 LAB — PREPARE RBC (CROSSMATCH)

## 2021-02-21 MED ORDER — DILTIAZEM HCL 25 MG/5ML IV SOLN
10.0000 mg | Freq: Once | INTRAVENOUS | Status: AC
  Administered 2021-02-21: 12:00:00 10 mg via INTRAVENOUS
  Filled 2021-02-21: qty 5

## 2021-02-21 MED ORDER — CARVEDILOL 6.25 MG PO TABS
6.2500 mg | ORAL_TABLET | Freq: Two times a day (BID) | ORAL | Status: DC
  Administered 2021-02-21 – 2021-02-25 (×7): 6.25 mg via ORAL
  Filled 2021-02-21 (×8): qty 1

## 2021-02-21 MED ORDER — DILTIAZEM HCL ER COATED BEADS 120 MG PO CP24
360.0000 mg | ORAL_CAPSULE | Freq: Every day | ORAL | Status: DC
  Administered 2021-02-21 – 2021-02-25 (×5): 360 mg via ORAL
  Filled 2021-02-21 (×3): qty 3
  Filled 2021-02-21: qty 2
  Filled 2021-02-21: qty 3

## 2021-02-21 MED ORDER — SODIUM CHLORIDE 0.9% IV SOLUTION
Freq: Once | INTRAVENOUS | Status: DC
  Filled 2021-02-21: qty 250

## 2021-02-21 MED ORDER — CHLORHEXIDINE GLUCONATE CLOTH 2 % EX PADS
6.0000 | MEDICATED_PAD | Freq: Every day | CUTANEOUS | Status: DC
  Administered 2021-02-22 – 2021-02-25 (×4): 6 via TOPICAL
  Filled 2021-02-21: qty 6

## 2021-02-21 NOTE — ED Notes (Signed)
Secure chat sent to Doylene Canning RN

## 2021-02-21 NOTE — ED Notes (Addendum)
Bedside EKG performed on mobile EKG machine and saved. Notified MD of EKG abnormal results of AFIB with RVR and occasional PVC's. Pt denies chest pain, SOB, palpitations or weakness at this time.

## 2021-02-21 NOTE — Progress Notes (Addendum)
PROGRESS NOTE    Gregory Dixon  DUK:[id number] DOB: 12-16-1940 DOA: 02/20/2021 PCP: Valera Castle, MD   Chief Complaint  Patient presents with   Fall    Brief Narrative:   Gregory Dixon is a 80 y.o. male with medical history significant for MGUS, iron deficiency anemia, end-stage renal disease on hemodialysis via right upper anterior PermCath, cholecystitis status post IR percutaneous cholecystostomy placement, insulin-dependent diabetes mellitus, hypertension, GERD, atrial fibrillation, on anticoagulation, who presents emergency department for chief concerns of frequent falling, weakness, his work-up significant for anemia with hemoglobin of 7.2, he does endorse bright red blood per rectum, he was admitted for further work-up.   Assessment & Plan:   Principal Problem:   Acute blood loss anemia Active Problems:   Acute lower GI bleeding   Essential hypertension   Benign prostatic hyperplasia   Hyperlipidemia   Anemia associated with chronic renal failure   Acute anemia   MGUS (monoclonal gammopathy of unknown significance)   Weakness   Symptomatic anemia    Symptomatic anemia/acute blood loss anemia-presumed secondary to lower GI bleed -Hemoglobin at 7.2, he is transfused 1 unit, repeat hemoglobin is 7.6, continue to monitor CBC closely and transfuse as needed to keep hemoglobin> 7. -GI input greatly appreciated, plan for endoscopy, will hold his Eliquis, allow 2 to 3 days to washout as discussed with GI, likely plan to start bowel prep tomorrow. -Monitor H&H closely and transfuse as needed -Continue to hold Eliquis   Insulin-dependent diabetes mellitus - insulin SSI with at bedtime coverage ordered - Hemoglobin A1c was 5.6 on 01/25/2021   End-stage renal disease on hemodialysis via permacath - Renal consulted, plan for hemodialysis tomorrow on Monday Wednesday Friday schedule   Atrial fibrillation with RVR -Heart rate uncontrolled earlier in ED, but this has  improved after receiving his home dose Cardizem, beta-blockers, and 10 of IV Cardizem push  -Continue to hold Eliquis due to GI bleed .   Hypertension -Cardizem and beta-blockers were resumed for heart rate control.Marland Kitchen   COVID 19 infection - Initially positive on 11/9, he is positive again today, asymptomatic, no hypoxia, no pneumonia on imaging, no indication for treatment, will DC quarantine after 10 days on 11/20.  History of cholecystitis -Status post percutaneous cholecystostomy drain, discussed with general surgery, no plan for surgery this admission.  DVT prophylaxis: Eliquis on hold for GI bleed. Code Status: DNR Family Communication: none at bedside Disposition:   Status is: Inpatient  Remains inpatient appropriate because:work-up for GI bleed       Consultants:  Renal GI Gen surgery  Subjective:  He denies any nausea, vomiting or abdominal pain  Objective: Vitals:   02/21/21 1400 02/21/21 1445 02/21/21 1500 02/21/21 1605  BP: 105/64  99/64 (!) 97/58  Pulse:  97 87 89  Resp: 18 20 14 17   Temp:      TempSrc:      SpO2: 98% 99% 99% 98%  Weight:      Height:        Intake/Output Summary (Last 24 hours) at 02/21/2021 1633 Last data filed at 02/21/2021 0211 Gross per 24 hour  Intake 810 ml  Output --  Net 810 ml   Filed Weights   02/20/21 1949  Weight: 94.3 kg    Examination:  General exam: Appears calm and comfortable  Respiratory system: Clear to auscultation. Respiratory effort normal. Cardiovascular system: S1 & S2 heard, RRR. No JVD, murmurs, rubs, gallops or clicks. No pedal edema. Gastrointestinal system: Abdomen is nondistended,  soft and nontender. No organomegaly or masses felt. Normal bowel sounds heard. Central nervous system: Alert and oriented. No focal neurological deficits. Extremities: Symmetric 5 x 5 power. Skin: No rashes, lesions or ulcers Psychiatry: Judgement and insight appear normal. Mood & affect appropriate.     Data  Reviewed: I have personally reviewed following labs and imaging studies  CBC: Recent Labs  Lab 02/20/21 2022 02/21/21 0251 02/21/21 0830  WBC 5.1  --  4.9  HGB 7.2* 7.3* 7.6*  HCT 23.4* 23.0* 24.1*  MCV 89.7  --  89.6  PLT 186  --  588    Basic Metabolic Panel: Recent Labs  Lab 02/20/21 2022 02/21/21 0830  NA 136 137  K 3.7 4.0  CL 102 101  CO2 24 26  GLUCOSE 154* 99  BUN 14 17  CREATININE 4.49* 5.54*  CALCIUM 7.8* 8.2*    GFR: Estimated Creatinine Clearance: 14 mL/min (A) (by C-G formula based on SCr of 5.54 mg/dL (H)).  Liver Function Tests: Recent Labs  Lab 02/20/21 2022  AST 22  ALT 10  ALKPHOS 36*  BILITOT 0.6  PROT 6.3*  ALBUMIN 2.7*    CBG: Recent Labs  Lab 02/20/21 2319 02/21/21 0919 02/21/21 1157  GLUCAP 141* 91 148*     Recent Results (from the past 240 hour(s))  Resp Panel by RT-PCR (Flu A&B, Covid) Nasopharyngeal Swab     Status: Abnormal   Collection Time: 02/13/21 10:52 AM   Specimen: Nasopharyngeal Swab; Nasopharyngeal(NP) swabs in vial transport medium  Result Value Ref Range Status   SARS Coronavirus 2 by RT PCR POSITIVE (A) NEGATIVE Final    Comment: RESULT CALLED TO, READ BACK BY AND VERIFIED WITH: Edwin Dada, RN [phone] 02/13/21 GM (NOTE) SARS-CoV-2 target nucleic acids are DETECTED.  The SARS-CoV-2 RNA is generally detectable in upper respiratory specimens during the acute phase of infection. Positive results are indicative of the presence of the identified virus, but do not rule out bacterial infection or co-infection with other pathogens not detected by the test. Clinical correlation with patient history and other diagnostic information is necessary to determine patient infection status. The expected result is Negative.  Fact Sheet for Patients: EntrepreneurPulse.com.au  Fact Sheet for Healthcare Providers: IncredibleEmployment.be  This test is not yet approved or cleared by the  [place] FDA and  has been authorized for detection and/or diagnosis of SARS-CoV-2 by FDA under an Emergency Use Authorization (EUA).  This EUA will remain in effect (meaning this test can be [place] ed) for the duration of  the COVID-19 declaration under Section 564(b)(1) of the Act, 21 U.S.C. section 360bbb-3(b)(1), unless the authorization is terminated or revoked sooner.     Influenza A by PCR NEGATIVE NEGATIVE Final   Influenza B by PCR NEGATIVE NEGATIVE Final    Comment: (NOTE) The Xpert Xpress SARS-CoV-2/FLU/RSV plus assay is intended as an aid in the diagnosis of influenza from Nasopharyngeal swab specimens and should not be used as a sole basis for treatment. Nasal washings and aspirates are unacceptable for Xpert Xpress SARS-CoV-2/FLU/RSV testing.  Fact Sheet for Patients: EntrepreneurPulse.com.au  Fact Sheet for Healthcare Providers: IncredibleEmployment.be  This test is not yet approved or cleared by the [place] FDA and has been authorized for detection and/or diagnosis of SARS-CoV-2 by FDA under an Emergency Use Authorization (EUA). This EUA will remain in effect (meaning this test can be used) for the duration of the COVID-19 declaration under Section 564(b)(1) of the Act, 21 U.S.C. section 360bbb-3(b)(1), unless  the authorization is terminated or revoked.  Performed at [facility], [address] [place], [place] [address]   Resp Panel by RT-PCR (Flu A&B, Covid) Nasopharyngeal Swab     Status: Abnormal   Collection Time: 02/20/21  8:22 PM   Specimen: Nasopharyngeal Swab; Nasopharyngeal(NP) swabs in vial transport medium  Result Value Ref Range Status   SARS Coronavirus 2 by RT PCR POSITIVE (A) NEGATIVE Final    Comment: RESULT CALLED TO, READ BACK BY AND VERIFIED WITH: WHITNEY HICKS @2203  ON 02/20/21 (NOTE) SARS-CoV-2 target nucleic acids are DETECTED.  The SARS-CoV-2 RNA is generally detectable in upper  respiratory specimens during the acute phase of infection. Positive results are indicative of the presence of the identified virus, but do not rule out bacterial infection or co-infection with other pathogens not detected by the test. Clinical correlation with patient history and other diagnostic information is necessary to determine patient infection status. The expected result is Negative.  Fact Sheet for Patients: EntrepreneurPulse.com.au  Fact Sheet for Healthcare Providers: IncredibleEmployment.be  This test is not yet approved or cleared by the [place] FDA and  has been authorized for detection and/or diagnosis of SARS-CoV-2 by FDA under an Emergency Use Authorization (EUA).  This EUA will remain in effect (meaning this test can be u sed) for the duration of  the COVID-19 declaration under Section 564(b)(1) of the Act, 21 U.S.C. section 360bbb-3(b)(1), unless the authorization is terminated or revoked sooner.     Influenza A by PCR NEGATIVE NEGATIVE Final   Influenza B by PCR NEGATIVE NEGATIVE Final    Comment: (NOTE) The Xpert Xpress SARS-CoV-2/FLU/RSV plus assay is intended as an aid in the diagnosis of influenza from Nasopharyngeal swab specimens and should not be used as a sole basis for treatment. Nasal washings and aspirates are unacceptable for Xpert Xpress SARS-CoV-2/FLU/RSV testing.  Fact Sheet for Patients: EntrepreneurPulse.com.au  Fact Sheet for Healthcare Providers: IncredibleEmployment.be  This test is not yet approved or cleared by the [place] FDA and has been authorized for detection and/or diagnosis of SARS-CoV-2 by FDA under an Emergency Use Authorization (EUA). This EUA will remain in effect (meaning this test can be used) for the duration of the COVID-19 declaration under Section 564(b)(1) of the Act, 21 U.S.C. section 360bbb-3(b)(1), unless the authorization is  terminated or revoked.  Performed at [facility], [address] [place], [place] [address]          Radiology Studies: CT ABDOMEN PELVIS WO CONTRAST  Result Date: 02/20/2021 CLINICAL DATA:  Abdominal trauma syncope, fell and pulled cholecystostomy drain EXAM: CT ABDOMEN AND PELVIS WITHOUT CONTRAST TECHNIQUE: Multidetector CT imaging of the abdomen and pelvis was performed following the standard protocol without IV contrast. COMPARISON:  01/23/2021 FINDINGS: Lower chest: No acute abnormality. Hepatobiliary: Cholecystostomy drain remains in place in a decompressed gallbladder. 10 mm gallstone noted. No focal hepatic abnormality. Pancreas: No focal abnormality or ductal dilatation. Spleen: No focal abnormality.  Normal size. Adrenals/Urinary Tract: Bilateral renal cysts, the largest in the upper pole of the left kidney measuring 4 cm. 6 mm nonobstructing left lower pole renal stone. No hydronephrosis. Adrenal glands and urinary bladder unremarkable. Stomach/Bowel: Normal appendix. Stomach, large and small bowel grossly unremarkable. Vascular/Lymphatic: Aortic atherosclerosis. No evidence of aneurysm or adenopathy. Reproductive: Prostate enlargement Other: No free fluid or free air. Musculoskeletal: No acute bony abnormality. IMPRESSION: No evidence of injury. Cholecystostomy tube remains in decompressed gallbladder. Cholelithiasis. Left nephrolithiasis.  No hydronephrosis. Aortic atherosclerosis. Prostate enlargement. Electronically  Signed   By: Rolm Baptise M.D.   On: 02/20/2021 20:44   CT HEAD WO CONTRAST (5MM)  Result Date: 02/20/2021 CLINICAL DATA:  Fall, head injury EXAM: CT HEAD WITHOUT CONTRAST TECHNIQUE: Contiguous axial images were obtained from the base of the skull through the vertex without intravenous contrast. COMPARISON:  None. FINDINGS: Brain: Small hypodensity in the anterior limb internal capsule on the right likely of chronic ischemic insult. No other areas of  infarction. No acute hemorrhage or mass. Ventricle size normal. Vascular: Negative for hyperdense vessel Skull: Negative Sinuses/Orbits: Mucosal edema paranasal sinuses. Bilateral cataract extraction Other: None IMPRESSION: No acute abnormality. Small hypodensity right internal capsule most consistent with chronic ischemia. Electronically Signed   By: Franchot Gallo M.D.   On: 02/20/2021 20:40   DG Chest Portable 1 View  Result Date: 02/20/2021 CLINICAL DATA:  Syncope EXAM: PORTABLE CHEST 1 VIEW COMPARISON:  01/23/2021 FINDINGS: Interval placement of right jugular central venous catheter with the tip in the lower SVC. No pneumothorax. Mild elevation right hemidiaphragm unchanged. Minimal right lower lobe atelectasis. Left lung clear. Negative for heart failure edema or effusion. IMPRESSION: Elevated right hemidiaphragm with mild right lower lobe atelectasis Central venous catheter tip in the SVC without pneumothorax. Electronically Signed   By: Franchot Gallo M.D.   On: 02/20/2021 20:36        Scheduled Meds:  carvedilol  6.25 mg Oral BID WC   diltiazem  360 mg Oral Daily   insulin aspart  0-5 Units Subcutaneous QHS   insulin aspart  0-6 Units Subcutaneous TID WC   Continuous Infusions:  sodium chloride       LOS: 0 days       Phillips Climes, MD [facility]   To contact the attending provider between 7A-7P or the covering provider during after hours 7P-7A, please log into the web site [email or web address] and access using universal [place] password for that web site. If you do not have the password, please call the hospital operator.  02/21/2021, 4:33 PM

## 2021-02-21 NOTE — ED Notes (Signed)
Pt had bowel movement at this time, dark and solid in nature. Pt cleaned, brief changed, chuck pad changed, pt resting comfortably.

## 2021-02-21 NOTE — ED Notes (Signed)
Pt meal delivered to bedside. Declined all food except roll and peaches.

## 2021-02-21 NOTE — ED Notes (Signed)
Emptied patients biliary tube into container and quantified at 200 ml. Cleaned tube port site with alcohol wipe after disconnecting and per patient- he flushes 5 mL normal saline into base port then reconnects. Flushed at port site and flushed well and reconnected. Pt then reattached tube to leg.

## 2021-02-21 NOTE — Progress Notes (Signed)
[facility]  ROUNDING NOTE   Subjective:   Mr. Akira Adelsberger was admitted to [facility] on 02/20/2021 for Acute anemia [D64.9]  Last hemodialysis treatment was yesterday. Completed his entire treatment.   Patient was sitting in his lounge chair and was transferring to his wheelchair when he had fall with loss of consciousness.   Patient reports blood in stool for over one week.    Objective:  Vital signs in last 24 hours:  Temp:  [98.1 F (36.7 C)-99 F (37.2 C)] 98.1 F (36.7 C) (11/17 0915) Pulse Rate:  [58-104] 100 (11/17 0915) Resp:  [15-29] 17 (11/17 1110) BP: (78-126)/(41-89) 119/64 (11/17 1100) SpO2:  [98 %-100 %] 98 % (11/17 1110) Weight:  [94.3 kg] 94.3 kg (11/16 1949)  Weight change:  Filed Weights   02/20/21 1949  Weight: 94.3 kg    Intake/Output: I/O last 3 completed shifts: In: 60 [Blood:310; IV Piggyback:500] Out: -    Intake/Output this shift:  No intake/output data recorded.  Physical Exam: General: NAD, laying in stretcher  Head: Normocephalic, atraumatic. Moist oral mucosal membranes  Eyes: Anicteric, PERRL  Neck: Supple, trachea midline  Lungs:  Clear to auscultation  Heart: Irregular, tachycardia  Abdomen:  Soft, nontender, RUQ drain   Extremities:  no peripheral edema.  Neurologic: Nonfocal, moving all four extremities  Skin: No lesions  Access: RIJ permcath    Basic Metabolic Panel: Recent Labs  Lab 02/20/21 2022 02/21/21 0830  NA 136 137  K 3.7 4.0  CL 102 101  CO2 24 26  GLUCOSE 154* 99  BUN 14 17  CREATININE 4.49* 5.54*  CALCIUM 7.8* 8.2*    Liver Function Tests: Recent Labs  Lab 02/20/21 2022  AST 22  ALT 10  ALKPHOS 36*  BILITOT 0.6  PROT 6.3*  ALBUMIN 2.7*   No results for input(s): LIPASE, AMYLASE in the last 168 hours. No results for input(s): AMMONIA in the last 168 hours.  CBC: Recent Labs  Lab 02/20/21 2022 02/21/21 0251 02/21/21 0830  WBC 5.1  --  4.9  HGB 7.2* 7.3* 7.6*  HCT 23.4*  23.0* 24.1*  MCV 89.7  --  89.6  PLT 186  --  174    Cardiac Enzymes: No results for input(s): CKTOTAL, CKMB, CKMBINDEX, TROPONINI in the last 168 hours.  BNP: Invalid input(s): POCBNP  CBG: Recent Labs  Lab 02/20/21 2319 02/21/21 0919 02/21/21 1157  GLUCAP 141* 91 148*    Microbiology: Results for orders placed or performed during the hospital encounter of 02/20/21  Resp Panel by RT-PCR (Flu A&B, Covid) Nasopharyngeal Swab     Status: Abnormal   Collection Time: 02/20/21  8:22 PM   Specimen: Nasopharyngeal Swab; Nasopharyngeal(NP) swabs in vial transport medium  Result Value Ref Range Status   SARS Coronavirus 2 by RT PCR POSITIVE (A) NEGATIVE Final    Comment: RESULT CALLED TO, READ BACK BY AND VERIFIED WITH: WHITNEY HICKS @2203  ON 02/20/21 (NOTE) SARS-CoV-2 target nucleic acids are DETECTED.  The SARS-CoV-2 RNA is generally detectable in upper respiratory specimens during the acute phase of infection. Positive results are indicative of the presence of the identified virus, but do not rule out bacterial infection or co-infection with other pathogens not detected by the test. Clinical correlation with patient history and other diagnostic information is necessary to determine patient infection status. The expected result is Negative.  Fact Sheet for Patients: EntrepreneurPulse.com.au  Fact Sheet for Healthcare Providers: IncredibleEmployment.be  This test is not yet approved or cleared  by the [place] and  has been authorized for detection and/or diagnosis of SARS-CoV-2 by FDA under an Emergency Use Authorization (EUA).  This EUA will remain in effect (meaning this test can be u sed) for the duration of  the COVID-19 declaration under Section 564(b)(1) of the Act, 21 U.S.C. section 360bbb-3(b)(1), unless the authorization is terminated or revoked sooner.     Influenza A by PCR NEGATIVE NEGATIVE Final   Influenza B by  PCR NEGATIVE NEGATIVE Final    Comment: (NOTE) The Xpert Xpress SARS-CoV-2/FLU/RSV plus assay is intended as an aid in the diagnosis of influenza from Nasopharyngeal swab specimens and should not be used as a sole basis for treatment. Nasal washings and aspirates are unacceptable for Xpert Xpress SARS-CoV-2/FLU/RSV testing.  Fact Sheet for Patients: EntrepreneurPulse.com.au  Fact Sheet for Healthcare Providers: IncredibleEmployment.be  This test is not yet approved or cleared by the [place] FDA and has been authorized for detection and/or diagnosis of SARS-CoV-2 by FDA under an Emergency Use Authorization (EUA). This EUA will remain in effect (meaning this test can be used) for the duration of the COVID-19 declaration under Section 564(b)(1) of the Act, 21 U.S.C. section 360bbb-3(b)(1), unless the authorization is terminated or revoked.  Performed at [facility], [address] [place], [place] [address]     Coagulation Studies: No results for input(s): LABPROT, INR in the last 72 hours.  Urinalysis: No results for input(s): COLORURINE, LABSPEC, PHURINE, GLUCOSEU, HGBUR, BILIRUBINUR, KETONESUR, PROTEINUR, UROBILINOGEN, NITRITE, LEUKOCYTESUR in the last 72 hours.  Invalid input(s): APPERANCEUR    Imaging: CT ABDOMEN PELVIS WO CONTRAST  Result Date: 02/20/2021 CLINICAL DATA:  Abdominal trauma syncope, fell and pulled cholecystostomy drain EXAM: CT ABDOMEN AND PELVIS WITHOUT CONTRAST TECHNIQUE: Multidetector CT imaging of the abdomen and pelvis was performed following the standard protocol without IV contrast. COMPARISON:  01/23/2021 FINDINGS: Lower chest: No acute abnormality. Hepatobiliary: Cholecystostomy drain remains in place in a decompressed gallbladder. 10 mm gallstone noted. No focal hepatic abnormality. Pancreas: No focal abnormality or ductal dilatation. Spleen: No focal abnormality.  Normal size. Adrenals/Urinary  Tract: Bilateral renal cysts, the largest in the upper pole of the left kidney measuring 4 cm. 6 mm nonobstructing left lower pole renal stone. No hydronephrosis. Adrenal glands and urinary bladder unremarkable. Stomach/Bowel: Normal appendix. Stomach, large and small bowel grossly unremarkable. Vascular/Lymphatic: Aortic atherosclerosis. No evidence of aneurysm or adenopathy. Reproductive: Prostate enlargement Other: No free fluid or free air. Musculoskeletal: No acute bony abnormality. IMPRESSION: No evidence of injury. Cholecystostomy tube remains in decompressed gallbladder. Cholelithiasis. Left nephrolithiasis.  No hydronephrosis. Aortic atherosclerosis. Prostate enlargement. Electronically Signed   By: Rolm Baptise M.D.   On: 02/20/2021 20:44   CT HEAD WO CONTRAST (5MM)  Result Date: 02/20/2021 CLINICAL DATA:  Fall, head injury EXAM: CT HEAD WITHOUT CONTRAST TECHNIQUE: Contiguous axial images were obtained from the base of the skull through the vertex without intravenous contrast. COMPARISON:  None. FINDINGS: Brain: Small hypodensity in the anterior limb internal capsule on the right likely of chronic ischemic insult. No other areas of infarction. No acute hemorrhage or mass. Ventricle size normal. Vascular: Negative for hyperdense vessel Skull: Negative Sinuses/Orbits: Mucosal edema paranasal sinuses. Bilateral cataract extraction Other: None IMPRESSION: No acute abnormality. Small hypodensity right internal capsule most consistent with chronic ischemia. Electronically Signed   By: Franchot Gallo M.D.   On: 02/20/2021 20:40   DG Chest Portable 1 View  Result Date: 02/20/2021 CLINICAL DATA:  Syncope EXAM: PORTABLE CHEST  1 VIEW COMPARISON:  01/23/2021 FINDINGS: Interval placement of right jugular central venous catheter with the tip in the lower SVC. No pneumothorax. Mild elevation right hemidiaphragm unchanged. Minimal right lower lobe atelectasis. Left lung clear. Negative for heart failure edema or  effusion. IMPRESSION: Elevated right hemidiaphragm with mild right lower lobe atelectasis Central venous catheter tip in the SVC without pneumothorax. Electronically Signed   By: Franchot Gallo M.D.   On: 02/20/2021 20:36     Medications:    sodium chloride      carvedilol  6.25 mg Oral BID WC   diltiazem  360 mg Oral Daily   insulin aspart  0-5 Units Subcutaneous QHS   insulin aspart  0-6 Units Subcutaneous TID WC   acetaminophen **OR** acetaminophen, ondansetron **OR** ondansetron (ZOFRAN) IV  Assessment/ Plan:  Mr. Nic Lampe is a 80 y.o. white male with end stage renal disease on hemodialysis, hypertension, hyperlipidemia, atrial fibrillation, diabetes mellitus type II who is admitted to [facility] on 02/20/2021 for Acute anemia [D64.9]  [facility] RIJ permcath 95.5kg  End Stage Renal Disease: MWF schedule - Dialysis for tomorrow.   Hypertension with chronic kidney disease: 119/64. Atrial fibrillation with RVR. Concern for syncopal episode.  - restarted on diltiazem and carvedilol.  - Appreciate cardiology input  Anemia with chronic kidney disease: hemoglobin 7.6. Receiving mircera 60 units with outpatient dialysis. Now with lower GI bleed. Outpatient labs consistent with iron deficiency.  - Appreciate GI input.  Secondary Hyperparathyroidism: outpatient labs from 11/14 with Phos 5.1, calcium 8, PTH elevated at 720. Not currently on binders.    LOS: 0 Keyarra Rendall 11/17/202212:11 PM

## 2021-02-21 NOTE — ED Notes (Addendum)
During transfusion pt reported continued pain at IV site and reported transfusion previous night was not uncomfortable. Inspected site and noticed some bleeding around site and some mild swelling. Pulled/removed IV catheter and will replace.  Transfusion paused for approximately five minutes while IV catheter was replaced and new 20g IV catheter placed in right AC.

## 2021-02-21 NOTE — ED Notes (Signed)
Patient tolerating transfusion well. Increased rate to 250 ml after 15 minute recheck vitals.

## 2021-02-21 NOTE — ED Notes (Addendum)
ED TO INPATIENT HANDOFF REPORT  ED Nurse Name and Phone #: [phone]  S Name/Age/Gender Gregory Dixon 80 y.o. male Room/Bed: ED09A/ED09A  Code Status   Code Status: DNR  Home/SNF/Other Home Patient oriented to: self, place, time, and situation Is this baseline? Yes   Triage Complete: Triage complete  Chief Complaint Acute anemia [D64.9] Symptomatic anemia [D64.9]  Triage Note Pt presents to the ED after a fall. Pt denies any LOC or head injury. States he caught himself with his hand. Pt has a drain from gallbladder Sx recently and states that his arm pulled on the drain when he fell. Pt denies pain but endorses nausea, and fatigue.   Allergies Allergies  Allergen Reactions   Aspirin Other (See Comments)    GI bleed   Codeine Nausea Only and Nausea And Vomiting    Level of Care/Admitting Diagnosis ED Disposition     ED Disposition  Admit   Condition  --   Comment  Hospital Area: [place] [[id number]]  Level of Care: Telemetry Medical [[id number]]  Covid Evaluation: Confirmed COVID Positive  Diagnosis: Symptomatic anemia [[id number]]  Admitting Physician: Manfred Shirts  Attending Physician: Waldron Labs, DAWOOD S [[id number]]  Estimated length of stay: past midnight tomorrow  Certification:: I certify this patient will need inpatient services for at least 2 midnights          B Medical/Surgery History Past Medical History:  Diagnosis Date   Actinic keratosis    Basal cell carcinoma 10/21/2017   left lateral neck, excised 02/09/2018   Basal cell carcinoma ([place]) 05/12/2019   right posterior ear, excised 05/31/2019   Blood transfusion without reported diagnosis    Diabetes mellitus without complication [facility])    Past Surgical History:  Procedure Laterality Date   COLONOSCOPY WITH PROPOFOL N/A 04/29/2019   Procedure: COLONOSCOPY WITH PROPOFOL;  Surgeon: Jonathon Bellows, MD;  Location: [facility] ENDOSCOPY;  Service: Gastroenterology;  Laterality: N/A;    DIALYSIS/PERMA CATHETER INSERTION Bilateral 01/28/2021   Procedure: DIALYSIS/PERMA CATHETER INSERTION;  Surgeon: Algernon Huxley, MD;  Location: [place] [facility];  Service: Cardiovascular;  Laterality: Bilateral;   ESOPHAGOGASTRODUODENOSCOPY (EGD) WITH PROPOFOL N/A 04/29/2019   Procedure: ESOPHAGOGASTRODUODENOSCOPY (EGD) WITH PROPOFOL;  Surgeon: Jonathon Bellows, MD;  Location: [facility] ENDOSCOPY;  Service: Gastroenterology;  Laterality: N/A;   IR PERC CHOLECYSTOSTOMY  01/25/2021     A IV Location/Drains/Wounds Patient Lines/Drains/Airways Status     Active Line/Drains/Airways     Name Placement date Placement time Site Days   Peripheral IV 02/20/21 20 G 1.25" Anterior;Left Forearm 02/20/21  2040  Forearm  1   Hemodialysis Catheter Right Internal jugular Double lumen Permanent (Tunneled) 01/28/21  1541  Internal jugular  24   Open Drain 1 Lateral RUQ 01/25/21  --  RUQ  27   Biliary Tube Cook slip-coat 10.2 Fr. RUQ 01/25/21  1124  RUQ  27            Intake/Output Last 24 hours  Intake/Output Summary (Last 24 hours) at 02/21/2021 1917 Last data filed at 02/21/2021 0211 Gross per 24 hour  Intake 810 ml  Output --  Net 810 ml    Labs/Imaging Results for orders placed or performed during the hospital encounter of 02/20/21 (from the past 48 hour(s))  CBC     Status: Abnormal   Collection Time: 02/20/21  8:22 PM  Result Value Ref Range   WBC 5.1 4.0 - 10.5 K/uL   RBC 2.61 (L) 4.22 - 5.81 MIL/uL  Hemoglobin 7.2 (L) 13.0 - 17.0 g/dL   HCT 23.4 (L) 39.0 - 52.0 %   MCV 89.7 80.0 - 100.0 fL   MCH 27.6 26.0 - 34.0 pg   MCHC 30.8 30.0 - 36.0 g/dL   RDW 14.4 11.5 - 15.5 %   Platelets 186 150 - 400 K/uL   nRBC 0.0 0.0 - 0.2 %    Comment: Performed at [facility], [address] [place], [place] [address]  Comprehensive metabolic panel     Status: Abnormal   Collection Time: 02/20/21  8:22 PM  Result Value Ref Range   Sodium 136 135 - 145 mmol/L   Potassium 3.7 3.5 - 5.1  mmol/L   Chloride 102 98 - 111 mmol/L   CO2 24 22 - 32 mmol/L   Glucose, Bld 154 (H) 70 - 99 mg/dL    Comment: Glucose reference range applies only to samples taken after fasting for at least 8 hours.   BUN 14 8 - 23 mg/dL   Creatinine, Ser 4.49 (H) 0.61 - 1.24 mg/dL   Calcium 7.8 (L) 8.9 - 10.3 mg/dL   Total Protein 6.3 (L) 6.5 - 8.1 g/dL   Albumin 2.7 (L) 3.5 - 5.0 g/dL   AST 22 15 - 41 U/L   ALT 10 0 - 44 U/L   Alkaline Phosphatase 36 (L) 38 - 126 U/L   Total Bilirubin 0.6 0.3 - 1.2 mg/dL   GFR, Estimated 13 (L) >60 mL/min    Comment: (NOTE) Calculated using the CKD-EPI Creatinine Equation (2021)    Anion gap 10 5 - 15    Comment: Performed at [facility], [address] [place], [place] [address]  Lactic acid, plasma     Status: Abnormal   Collection Time: 02/20/21  8:22 PM  Result Value Ref Range   Lactic Acid, Venous 3.1 (HH) 0.5 - 1.9 mmol/L    Comment: CRITICAL RESULT CALLED TO, READ BACK BY AND VERIFIED WITH WHITNEY HICKS AT 2130 ON 02/20/21 RWW Performed at [facility], [address] [place], [place] [address]   Type and screen [place]     Status: None (Preliminary result)   Collection Time: 02/20/21  8:22 PM  Result Value Ref Range   ABO/RH(D) AB POS    Antibody Screen NEG    Sample Expiration 02/23/2021,2359    Unit Number [id number]    Blood Component Type RED CELLS,LR    Unit division 00    Status of Unit ISSUED,FINAL    Transfusion Status OK TO TRANSFUSE    Crossmatch Result Compatible    Unit Number [id number]    Blood Component Type RED CELLS,LR    Unit division 00    Status of Unit ISSUED    Transfusion Status OK TO TRANSFUSE    Crossmatch Result      Compatible Performed at [facility], [place], [place] [address]   Troponin I (High Sensitivity)     Status: Abnormal   Collection Time: 02/20/21  8:22 PM  Result Value Ref Range   Troponin I (High Sensitivity) 23 (H)  <18 ng/L    Comment: (NOTE) Elevated high sensitivity troponin I (hsTnI) values and significant  changes across serial measurements may suggest ACS but many other  chronic and acute conditions are known to elevate hsTnI results.  Refer to the "Links" section for chest pain algorithms and additional  guidance. Performed at [facility], [address] [place],  [place] [address]   Resp Panel by RT-PCR (Flu A&B, Covid) Nasopharyngeal Swab     Status: Abnormal   Collection Time: 02/20/21  8:22 PM   Specimen: Nasopharyngeal Swab; Nasopharyngeal(NP) swabs in vial transport medium  Result Value Ref Range   SARS Coronavirus 2 by RT PCR POSITIVE (A) NEGATIVE    Comment: RESULT CALLED TO, READ BACK BY AND VERIFIED WITH: WHITNEY HICKS @2203  ON 02/20/21 (NOTE) SARS-CoV-2 target nucleic acids are DETECTED.  The SARS-CoV-2 RNA is generally detectable in upper respiratory specimens during the acute phase of infection. Positive results are indicative of the presence of the identified virus, but do not rule out bacterial infection or co-infection with other pathogens not detected by the test. Clinical correlation with patient history and other diagnostic information is necessary to determine patient infection status. The expected result is Negative.  Fact Sheet for Patients: EntrepreneurPulse.com.au  Fact Sheet for Healthcare Providers: IncredibleEmployment.be  This test is not yet approved or cleared by the [place] FDA and  has been authorized for detection and/or diagnosis of SARS-CoV-2 by FDA under an Emergency Use Authorization (EUA).  This EUA will remain in effect (meaning this test can be u sed) for the duration of  the COVID-19 declaration under Section 564(b)(1) of the Act, 21 U.S.C. section 360bbb-3(b)(1), unless the authorization is terminated or revoked sooner.     Influenza A by PCR NEGATIVE NEGATIVE   Influenza B by PCR  NEGATIVE NEGATIVE    Comment: (NOTE) The Xpert Xpress SARS-CoV-2/FLU/RSV plus assay is intended as an aid in the diagnosis of influenza from Nasopharyngeal swab specimens and should not be used as a sole basis for treatment. Nasal washings and aspirates are unacceptable for Xpert Xpress SARS-CoV-2/FLU/RSV testing.  Fact Sheet for Patients: EntrepreneurPulse.com.au  Fact Sheet for Healthcare Providers: IncredibleEmployment.be  This test is not yet approved or cleared by the [place] FDA and has been authorized for detection and/or diagnosis of SARS-CoV-2 by FDA under an Emergency Use Authorization (EUA). This EUA will remain in effect (meaning this test can be used) for the duration of the COVID-19 declaration under Section 564(b)(1) of the Act, 21 U.S.C. section 360bbb-3(b)(1), unless the authorization is terminated or revoked.  Performed at [facility], [address] [place], [place] [address]   Prepare RBC (crossmatch)     Status: None   Collection Time: 02/20/21  9:08 PM  Result Value Ref Range   Order Confirmation      ORDER PROCESSED BY BLOOD BANK Performed at [facility], [place], [place] [address]   Troponin I (High Sensitivity)     Status: Abnormal   Collection Time: 02/20/21 10:10 PM  Result Value Ref Range   Troponin I (High Sensitivity) 24 (H) <18 ng/L    Comment: (NOTE) Elevated high sensitivity troponin I (hsTnI) values and significant  changes across serial measurements may suggest ACS but many other  chronic and acute conditions are known to elevate hsTnI results.  Refer to the "Links" section for chest pain algorithms and additional  guidance. Performed at [facility], [address] [place], [place] [address]   CBG monitoring, ED     Status: Abnormal   Collection Time: 02/20/21 11:19 PM  Result Value Ref Range   Glucose-Capillary 141 (H) 70 - 99 mg/dL     Comment: Glucose reference range applies only to samples taken after fasting for at least 8 hours.  Hemoglobin and hematocrit, blood     Status: Abnormal  Collection Time: 02/21/21  2:51 AM  Result Value Ref Range   Hemoglobin 7.3 (L) 13.0 - 17.0 g/dL   HCT 23.0 (L) 39.0 - 52.0 %    Comment: Performed at [facility], [address] [place], [place] [address]  Lactic acid, plasma     Status: None   Collection Time: 02/21/21  2:51 AM  Result Value Ref Range   Lactic Acid, Venous 1.6 0.5 - 1.9 mmol/L    Comment: Performed at [facility], [address] [place], [place] [address]  Basic metabolic panel     Status: Abnormal   Collection Time: 02/21/21  8:30 AM  Result Value Ref Range   Sodium 137 135 - 145 mmol/L   Potassium 4.0 3.5 - 5.1 mmol/L   Chloride 101 98 - 111 mmol/L   CO2 26 22 - 32 mmol/L   Glucose, Bld 99 70 - 99 mg/dL    Comment: Glucose reference range applies only to samples taken after fasting for at least 8 hours.   BUN 17 8 - 23 mg/dL   Creatinine, Ser 5.54 (H) 0.61 - 1.24 mg/dL   Calcium 8.2 (L) 8.9 - 10.3 mg/dL   GFR, Estimated 10 (L) >60 mL/min    Comment: (NOTE) Calculated using the CKD-EPI Creatinine Equation (2021)    Anion gap 10 5 - 15    Comment: Performed at [facility], [address] [place], [place] [address]  CBC     Status: Abnormal   Collection Time: 02/21/21  8:30 AM  Result Value Ref Range   WBC 4.9 4.0 - 10.5 K/uL   RBC 2.69 (L) 4.22 - 5.81 MIL/uL   Hemoglobin 7.6 (L) 13.0 - 17.0 g/dL   HCT 24.1 (L) 39.0 - 52.0 %   MCV 89.6 80.0 - 100.0 fL   MCH 28.3 26.0 - 34.0 pg   MCHC 31.5 30.0 - 36.0 g/dL   RDW 14.1 11.5 - 15.5 %   Platelets 174 150 - 400 K/uL   nRBC 0.0 0.0 - 0.2 %    Comment: Performed at [facility], [address] [place], [place] [address]  CBG monitoring, ED     Status: None   Collection Time: 02/21/21  9:19 AM  Result Value Ref Range   Glucose-Capillary 91 70 - 99 mg/dL     Comment: Glucose reference range applies only to samples taken after fasting for at least 8 hours.  CBG monitoring, ED     Status: Abnormal   Collection Time: 02/21/21 11:57 AM  Result Value Ref Range   Glucose-Capillary 148 (H) 70 - 99 mg/dL    Comment: Glucose reference range applies only to samples taken after fasting for at least 8 hours.  CBC     Status: Abnormal   Collection Time: 02/21/21  4:58 PM  Result Value Ref Range   WBC 5.1 4.0 - 10.5 K/uL   RBC 2.64 (L) 4.22 - 5.81 MIL/uL   Hemoglobin 7.4 (L) 13.0 - 17.0 g/dL   HCT 23.4 (L) 39.0 - 52.0 %   MCV 88.6 80.0 - 100.0 fL   MCH 28.0 26.0 - 34.0 pg   MCHC 31.6 30.0 - 36.0 g/dL   RDW 14.3 11.5 - 15.5 %   Platelets 175 150 - 400 K/uL   nRBC 0.0 0.0 - 0.2 %    Comment: Performed at [facility], [address] [place], [place] [address]  Prepare RBC (crossmatch)     Status: None  Collection Time: 02/21/21  5:35 PM  Result Value Ref Range   Order Confirmation      ORDER PROCESSED BY BLOOD BANK Performed at [facility], [address] [place], [place] [address]   CBG monitoring, ED     Status: Abnormal   Collection Time: 02/21/21  6:04 PM  Result Value Ref Range   Glucose-Capillary 101 (H) 70 - 99 mg/dL    Comment: Glucose reference range applies only to samples taken after fasting for at least 8 hours.   CT ABDOMEN PELVIS WO CONTRAST  Result Date: 02/20/2021 CLINICAL DATA:  Abdominal trauma syncope, fell and pulled cholecystostomy drain EXAM: CT ABDOMEN AND PELVIS WITHOUT CONTRAST TECHNIQUE: Multidetector CT imaging of the abdomen and pelvis was performed following the standard protocol without IV contrast. COMPARISON:  01/23/2021 FINDINGS: Lower chest: No acute abnormality. Hepatobiliary: Cholecystostomy drain remains in place in a decompressed gallbladder. 10 mm gallstone noted. No focal hepatic abnormality. Pancreas: No focal abnormality or ductal dilatation. Spleen: No focal abnormality.  Normal size.  Adrenals/Urinary Tract: Bilateral renal cysts, the largest in the upper pole of the left kidney measuring 4 cm. 6 mm nonobstructing left lower pole renal stone. No hydronephrosis. Adrenal glands and urinary bladder unremarkable. Stomach/Bowel: Normal appendix. Stomach, large and small bowel grossly unremarkable. Vascular/Lymphatic: Aortic atherosclerosis. No evidence of aneurysm or adenopathy. Reproductive: Prostate enlargement Other: No free fluid or free air. Musculoskeletal: No acute bony abnormality. IMPRESSION: No evidence of injury. Cholecystostomy tube remains in decompressed gallbladder. Cholelithiasis. Left nephrolithiasis.  No hydronephrosis. Aortic atherosclerosis. Prostate enlargement. Electronically Signed   By: Rolm Baptise M.D.   On: 02/20/2021 20:44   CT HEAD WO CONTRAST (5MM)  Result Date: 02/20/2021 CLINICAL DATA:  Fall, head injury EXAM: CT HEAD WITHOUT CONTRAST TECHNIQUE: Contiguous axial images were obtained from the base of the skull through the vertex without intravenous contrast. COMPARISON:  None. FINDINGS: Brain: Small hypodensity in the anterior limb internal capsule on the right likely of chronic ischemic insult. No other areas of infarction. No acute hemorrhage or mass. Ventricle size normal. Vascular: Negative for hyperdense vessel Skull: Negative Sinuses/Orbits: Mucosal edema paranasal sinuses. Bilateral cataract extraction Other: None IMPRESSION: No acute abnormality. Small hypodensity right internal capsule most consistent with chronic ischemia. Electronically Signed   By: Franchot Gallo M.D.   On: 02/20/2021 20:40   DG Chest Portable 1 View  Result Date: 02/20/2021 CLINICAL DATA:  Syncope EXAM: PORTABLE CHEST 1 VIEW COMPARISON:  01/23/2021 FINDINGS: Interval placement of right jugular central venous catheter with the tip in the lower SVC. No pneumothorax. Mild elevation right hemidiaphragm unchanged. Minimal right lower lobe atelectasis. Left lung clear. Negative for heart  failure edema or effusion. IMPRESSION: Elevated right hemidiaphragm with mild right lower lobe atelectasis Central venous catheter tip in the SVC without pneumothorax. Electronically Signed   By: Franchot Gallo M.D.   On: 02/20/2021 20:36    Pending Labs Unresulted Labs (From admission, onward)     Start     Ordered   02/22/21 0500  CBC  Tomorrow morning,   STAT        02/21/21 1647   02/22/21 2952  Basic metabolic panel  Tomorrow morning,   STAT        02/21/21 1647   Signed and Held  Hepatitis B surface antigen  (New Admission Hemo Labs (Hepatitis B))  Once,   R        Signed and Held   Signed and Held  Hepatitis B surface  antibody  (New Admission Hemo Labs (Hepatitis B))  Once,   R        Signed and Held   Signed and Held  Hepatitis B surface antibody,quantitative  (New Admission Hemo Labs (Hepatitis B))  Once,   R        Signed and Held   Signed and Held  Differential  Once,   R        Signed and Held            Vitals/Pain Today's Vitals   02/21/21 1809 02/21/21 1811 02/21/21 1817 02/21/21 1826  BP: (!) 106/54 (!) 106/54 (!) 91/54 (!) 92/56  Pulse: 85 85 78 85  Resp: 19 19 20  (!) 23  Temp: 97.7 F (36.5 C) 97.7 F (36.5 C) 97.8 F (36.6 C) 98.3 F (36.8 C)  TempSrc: Oral Oral Oral Oral  SpO2:  98% 100% 100%  Weight:      Height:      PainSc:        Isolation Precautions No active isolations  Medications Medications  0.9 %  sodium chloride infusion (10 mL/hr Intravenous Not Given 02/21/21 0212)  acetaminophen (TYLENOL) tablet 650 mg (has no administration in time range)    Or  acetaminophen (TYLENOL) suppository 650 mg (has no administration in time range)  ondansetron (ZOFRAN) tablet 4 mg (has no administration in time range)    Or  ondansetron (ZOFRAN) injection 4 mg (has no administration in time range)  insulin aspart (novoLOG) injection 0-5 Units (0 Units Subcutaneous Not Given 02/20/21 2333)  insulin aspart (novoLOG) injection 0-6 Units (0 Units  Subcutaneous Not Given 02/21/21 1823)  carvedilol (COREG) tablet 6.25 mg (6.25 mg Oral Not Given 02/21/21 1800)  diltiazem (CARDIZEM CD) 24 hr capsule 360 mg (360 mg Oral Given 02/21/21 1247)  Chlorhexidine Gluconate Cloth 2 % PADS 6 each (has no administration in time range)  0.9 %  sodium chloride infusion (Manually program via Guardrails IV Fluids) (0 mLs Intravenous Hold 02/21/21 1823)  sodium chloride 0.9 % bolus 250 mL (0 mLs Intravenous Stopped 02/20/21 2208)  sodium chloride 0.9 % bolus 250 mL (0 mLs Intravenous Stopped 02/20/21 2251)  diltiazem (CARDIZEM) injection 10 mg (10 mg Intravenous Given 02/21/21 1141)    Mobility wheelchair     Focused Assessments Renal Assessment Handoff:  Gets dialysis via permcath; Due for fistula placement tomorrow   R Recommendations: See Admitting Provider Note  Report given to:   Additional Notes: Getting 1U of blood currently

## 2021-02-21 NOTE — ED Notes (Signed)
Messaged MD pt has been tachycardic for the past twenty minutes. Originally he was eating, drinking and moving some, however he has still maintained tachy since finishing for the past ten minutes. HR has ranged from 110 up to 150. Will perform repeat 12 lead EKG.

## 2021-02-21 NOTE — ED Notes (Signed)
Pt received heart healthy diet for dinner and reported he was told by Dr. Waldron Labs he could have a regular diet. Confirmed he could have regular diet per MD and modified order per verbal consent from provider. Called dietary and requested some carbohydrates per patient. Clear diet in effect starting at midnight.

## 2021-02-21 NOTE — ED Notes (Signed)
At 15 min post transfusion start when rechecking pt, he reported some mild pain and discomfort at IV site. Site flushed with NaCl and was patent and mild discomfort reported but no burning. No redness or swelling or signs of infiltration. Will continue to monitor.

## 2021-02-21 NOTE — ED Notes (Signed)
Pt reports rested comfortably, no SOB, denies pain. Occasional cough overnight that is non productive.

## 2021-02-21 NOTE — Consult Note (Signed)
Vonda Antigua, MD [address], [place], [place], [place], [id number] [place], [place], [place], [address] Phone: [phone]  Fax: [phone]  Consultation  Referring Provider:     Dr. Waldron Labs Primary Care Physician:  Valera Castle, MD Reason for Consultation:     Anemia, rectal bleeding  Date of Admission:  02/20/2021 Date of Consultation:  02/21/2021         HPI:   Gregory Dixon is a 80 y.o. male with history of iron deficiency anemia, ESRD on hemodialysis, A. fib on Eliquis, who presents to the ER for weakness, frequent falling, and admitted to bright red blood per rectum for the last week at home and GI was consulted.  Patient reports taking Eliquis yesterday at home prior to presentation.  Patient was recently discharged on 01/31/2021 when he presented with weakness, with a positive HIDA scan at that time.  Due to patient's medical comorbidities, he was considered high risk and was managed with PTC tube.  During that admission patient developed new onset A. fib with RVR, needed diltiazem drip.  Dialysis was initiated after that admission as well.  Patient denies any constipation or straining with his bowel movements recently.  However, states that when he wipes sometimes he notices his hemorrhoids are bleeding.  He was previously seen by Dr. Vicente Males and underwent EGD and colonoscopy in January 2021 for bright red blood per rectum during a hospital admission.  He has previous history of hemorrhoid surgery as well.  EGD was normal.  Colonoscopy showed 2 large polyps, 15 mm and 20 mm in size that were both removed via EMR, piecemeal.  Pathology showed tubular adenoma.  Repeat was recommended in 6 months but patient never had this done.  Patient was also seen by Dr. Vicente Males during a hospital consult in August 2022 and patient refused EGD and colonoscopy at that time.  Capsule study was also supposed to be done the patient never had this done either.  During my  evaluation today, patient's heart rate varies between 110-140s.  ER nursing notes reveal that patient has had elevated heart rate since presentation and Primary MD has been notified.  CT on this admission showed cholecystostomy tube in place  Past Medical History:  Diagnosis Date   Actinic keratosis    Basal cell carcinoma 10/21/2017   left lateral neck, excised 02/09/2018   Basal cell carcinoma (BCC) 05/12/2019   right posterior ear, excised 05/31/2019   Blood transfusion without reported diagnosis    Diabetes mellitus without complication [facility])     Past Surgical History:  Procedure Laterality Date   COLONOSCOPY WITH PROPOFOL N/A 04/29/2019   Procedure: COLONOSCOPY WITH PROPOFOL;  Surgeon: Jonathon Bellows, MD;  Location: [facility] ENDOSCOPY;  Service: Gastroenterology;  Laterality: N/A;   DIALYSIS/PERMA CATHETER INSERTION Bilateral 01/28/2021   Procedure: DIALYSIS/PERMA CATHETER INSERTION;  Surgeon: Algernon Huxley, MD;  Location: [facility] CV LAB;  Service: Cardiovascular;  Laterality: Bilateral;   ESOPHAGOGASTRODUODENOSCOPY (EGD) WITH PROPOFOL N/A 04/29/2019   Procedure: ESOPHAGOGASTRODUODENOSCOPY (EGD) WITH PROPOFOL;  Surgeon: Jonathon Bellows, MD;  Location: [facility] ENDOSCOPY;  Service: Gastroenterology;  Laterality: N/A;   IR PERC CHOLECYSTOSTOMY  01/25/2021    Prior to Admission medications   Medication Sig Start Date End Date Taking? Authorizing Provider  apixaban (ELIQUIS) 2.5 MG TABS tablet Take 1 tablet (2.5 mg total) by mouth 2 (two) times daily. 01/31/21  Yes Val Riles, MD  ascorbic acid (VITAMIN C) 500 MG tablet Take 1 tablet (500 mg  total) by mouth daily. 02/01/21 03/03/21 Yes Val Riles, MD  carvedilol (COREG) 6.25 MG tablet Take 1 tablet (6.25 mg total) by mouth 2 (two) times daily with a meal. 01/31/21 03/02/21 Yes Val Riles, MD  diltiazem (CARDIZEM CD) 360 MG 24 hr capsule Take 1 capsule (360 mg total) by mouth daily. 02/01/21 03/03/21 Yes Val Riles, MD  ferrous sulfate  325 (65 FE) MG tablet Take 325 mg by mouth 2 (two) times daily with a meal.   Yes [provider]  insulin NPH-regular Human (70-30) 100 UNIT/ML injection Inject 10-30 Units into the skin 2 (two) times daily with a meal.   Yes [provider]  isosorbide mononitrate (IMDUR) 30 MG 24 hr tablet Take 30 mg by mouth at bedtime. 03/14/19  Yes [provider]  Multiple Vitamin (MULTIVITAMIN) capsule Take 1 capsule by mouth daily.   Yes [provider]  sodium chloride flush (NS) 0.9 % SOLN 5 mLs by Intracatheter route every 12 (twelve) hours. 01/26/21 02/25/21 Yes Sakai, Isami, DO  terazosin (HYTRIN) 10 MG capsule Take 10 mg by mouth at bedtime. 03/13/19  Yes [provider]  Vitamin D, Ergocalciferol, (DRISDOL) 1.25 MG (50000 UNIT) CAPS capsule Take 1 capsule (50,000 Units total) by mouth every 7 (seven) days. 02/07/21 05/08/21 Yes Val Riles, MD  fenofibrate 160 MG tablet Take 160 mg by mouth at bedtime.    [provider]  pantoprazole (PROTONIX) 40 MG tablet Take 1 tablet (40 mg total) by mouth daily. Patient not taking: Reported on 02/13/2021 11/25/20 12/25/20  Loletha Grayer, MD    Family History  Problem Relation Age of Onset   Hypertension Father      Social History   Tobacco Use   Smoking status: Former   Smokeless tobacco: Never  Scientific laboratory technician Use: Never used  Substance Use Topics   Alcohol use: Not Currently    Comment: rare once a year   Drug use: Never    Allergies as of 02/20/2021 - Review Complete 02/20/2021  Allergen Reaction Noted   Aspirin Other (See Comments) 10/20/2016   Codeine Nausea Only and Nausea And Vomiting 02/20/2011    Review of Systems:    All systems reviewed and negative except where noted in HPI.   Physical Exam:  Constitutional: General:   Alert,  Well-developed, well-nourished, pleasant and cooperative in NAD BP (!) 139/50   Pulse (!) 111   Temp 98.1 F (36.7 C) (Oral)   Resp 17   Ht  _0  (1.981 m)   Wt 94.3 kg   SpO2 100%   BMI 24.02 kg/m   Eyes:  Sclera clear, no icterus.   Conjunctiva pink. PERRLA  Ears:  No scars, lesions or masses, Normal auditory acuity. Nose:  No deformity, discharge, or lesions. Mouth:  No deformity or lesions, oropharynx pink & moist.  Neck:  Supple; no masses or thyromegaly.  Respiratory: Normal respiratory effort, Normal percussion  Gastrointestinal:  Normal bowel sounds.  No bruits.  Soft, non-tender and non-distended without masses, hepatosplenomegaly or hernias noted.  No guarding or rebound tenderness.     Rectal exam: Large nonbleeding external hemorrhoids present.  DRE with no stool or blood in the rectal vault.  Cardiac: No clubbing or edema.  No cyanosis. Normal posterior tibial pedal pulses noted.  Lymphatic:  No significant cervical or axillary adenopathy.  Psych:  Alert and cooperative. Normal mood and affect.  Musculoskeletal:  Head normocephalic, atraumatic. Symmetrical without gross deformities.   Skin:  Warm. Intact without significant lesions or rashes. No jaundice.  Neurologic:  Face symmetrical, tongue midline, Normal sensation to touch;  grossly normal neurologically.  Psych:  Alert and oriented x3, Alert and cooperative. Normal mood and affect.   LAB RESULTS: Recent Labs    02/20/21 2022 02/21/21 0251 02/21/21 0830  WBC 5.1  --  4.9  HGB 7.2* 7.3* 7.6*  HCT 23.4* 23.0* 24.1*  PLT 186  --  174   BMET Recent Labs    02/20/21 2022 02/21/21 0830  NA 136 137  K 3.7 4.0  CL 102 101  CO2 24 26  GLUCOSE 154* 99  BUN 14 17  CREATININE 4.49* 5.54*  CALCIUM 7.8* 8.2*   LFT Recent Labs    02/20/21 2022  PROT 6.3*  ALBUMIN 2.7*  AST 22  ALT 10  ALKPHOS 36*  BILITOT 0.6   PT/INR No results for input(s): LABPROT, INR in the last 72 hours.  STUDIES: CT ABDOMEN PELVIS WO CONTRAST  Result Date: 02/20/2021 CLINICAL DATA:  Abdominal trauma syncope, fell and pulled cholecystostomy drain EXAM:  CT ABDOMEN AND PELVIS WITHOUT CONTRAST TECHNIQUE: Multidetector CT imaging of the abdomen and pelvis was performed following the standard protocol without IV contrast. COMPARISON:  01/23/2021 FINDINGS: Lower chest: No acute abnormality. Hepatobiliary: Cholecystostomy drain remains in place in a decompressed gallbladder. 10 mm gallstone noted. No focal hepatic abnormality. Pancreas: No focal abnormality or ductal dilatation. Spleen: No focal abnormality.  Normal size. Adrenals/Urinary Tract: Bilateral renal cysts, the largest in the upper pole of the left kidney measuring 4 cm. 6 mm nonobstructing left lower pole renal stone. No hydronephrosis. Adrenal glands and urinary bladder unremarkable. Stomach/Bowel: Normal appendix. Stomach, large and small bowel grossly unremarkable. Vascular/Lymphatic: Aortic atherosclerosis. No evidence of aneurysm or adenopathy. Reproductive: Prostate enlargement Other: No free fluid or free air. Musculoskeletal: No acute bony abnormality. IMPRESSION: No evidence of injury. Cholecystostomy tube remains in decompressed gallbladder. Cholelithiasis. Left nephrolithiasis.  No hydronephrosis. Aortic atherosclerosis. Prostate enlargement. Electronically Signed   By: Rolm Baptise M.D.   On: 02/20/2021 20:44   CT HEAD WO CONTRAST (5MM)  Result Date: 02/20/2021 CLINICAL DATA:  Fall, head injury EXAM: CT HEAD WITHOUT CONTRAST TECHNIQUE: Contiguous axial images were obtained from the base of the skull through the vertex without intravenous contrast. COMPARISON:  None. FINDINGS: Brain: Small hypodensity in the anterior limb internal capsule on the right likely of chronic ischemic insult. No other areas of infarction. No acute hemorrhage or mass. Ventricle size normal. Vascular: Negative for hyperdense vessel Skull: Negative Sinuses/Orbits: Mucosal edema paranasal sinuses. Bilateral cataract extraction Other: None IMPRESSION: No acute abnormality. Small hypodensity right internal capsule most  consistent with chronic ischemia. Electronically Signed   By: Franchot Gallo M.D.   On: 02/20/2021 20:40   DG Chest Portable 1 View  Result Date: 02/20/2021 CLINICAL DATA:  Syncope EXAM: PORTABLE CHEST 1 VIEW COMPARISON:  01/23/2021 FINDINGS: Interval placement of right jugular central venous catheter with the tip in the lower SVC. No pneumothorax. Mild elevation right hemidiaphragm unchanged. Minimal right lower lobe atelectasis. Left lung clear. Negative for heart failure edema or effusion. IMPRESSION: Elevated right hemidiaphragm with mild right lower lobe atelectasis Central venous catheter tip in the SVC without pneumothorax. Electronically Signed   By: Franchot Gallo M.D.   On: 02/20/2021 20:36      Impression / Plan:   Gregory Dixon is a 80 y.o. y/o male with previous history of chronic anemia, underwent EGD and colonoscopy in January 2021  with large polyps removed from the colon and repeat was recommended in 6 months, along with recommendation for capsule study, and patient never had these procedures done, now with recent admissions for weakness, cholecystitis with PTC tube placement, initiation of dialysis, initiation of Eliquis with GI consulted for bright red blood per rectum  Patient was noted to be COVID-positive 8 days ago and repeat testing on presentation today was also positive.  However, he is on room air and does not have any hypoxia  Given that EGD and colonoscopy plus or minus capsule study was previously recommended after his January 2021 procedure, and patient never had these done, and has acute anemia again (with iron deficiency noted on 01/23/2021, and 12/13/2020 labs even prior to rectal bleeding episodes), EGD and colonoscopy and/or VCE would be beneficial during this admission.  However, patient will need to be medically optimized prior to his procedures.  Currently his heart rate is quite elevated and this will have to be optimized by primary team and/or cardiology  In  addition, he took Eliquis yesterday and given that he had large polyps in his colon in 2021 and polypectomy is considered a high risk procedure from bleeding standpoint, it would be ideal to hold Eliquis prior to the procedure.  Given his ESRD and renal insufficiency, usually Eliquis is held for 2 to 3 days prior to the procedure and I verified this with the pharmacy as well  However, if patient continues to have active bleeding, and we are not able to wait for Eliquis to clear the system, endoscopic procedures can be considered at an earlier time  Patient ate solid food today and took Eliquis yesterday morning.  Can potentially start clear liquid diet tomorrow morning with plan for starting colonoscopy prep tomorrow if patient's heart rate is better controlled, and he remains stable on room air from a COVID standpoint, and proceed with EGD and colonoscopy day after tomorrow, as Eliquis will have been held for 2 days by then  Will reassess the patient tomorrow and if better medically optimized, can order colonoscopy prep at that time  Would also recommend surgery consult given that last surgery note states the plan was for discussion of intermittent cholecystectomy as an outpatient, to determine plans for this in the near future.  Above discussed with primary attending Dr. Waldron Labs  Thank you for involving me in the care of this patient.      LOS: 0 days   Virgel Manifold, MD  02/21/2021, 2:02 PM

## 2021-02-21 NOTE — ED Notes (Signed)
Provided diet to patient for breakfast with ginger ale. Accidentally delivered to other area of ED hallway originally.

## 2021-02-22 DIAGNOSIS — Z992 Dependence on renal dialysis: Secondary | ICD-10-CM | POA: Diagnosis not present

## 2021-02-22 DIAGNOSIS — D62 Acute posthemorrhagic anemia: Secondary | ICD-10-CM | POA: Diagnosis not present

## 2021-02-22 DIAGNOSIS — N186 End stage renal disease: Secondary | ICD-10-CM | POA: Diagnosis not present

## 2021-02-22 DIAGNOSIS — K922 Gastrointestinal hemorrhage, unspecified: Secondary | ICD-10-CM | POA: Diagnosis not present

## 2021-02-22 LAB — BPAM RBC
Blood Product Expiration Date: 202211212359
Blood Product Expiration Date: 202212162359
ISSUE DATE / TIME: 202211162238
ISSUE DATE / TIME: 202211171752
Unit Type and Rh: 6200
Unit Type and Rh: 7300

## 2021-02-22 LAB — CBC WITH DIFFERENTIAL/PLATELET
Abs Immature Granulocytes: 0.05 10*3/uL (ref 0.00–0.07)
Basophils Absolute: 0 10*3/uL (ref 0.0–0.1)
Basophils Relative: 1 %
Eosinophils Absolute: 0.1 10*3/uL (ref 0.0–0.5)
Eosinophils Relative: 4 %
HCT: 23.7 % — ABNORMAL LOW (ref 39.0–52.0)
Hemoglobin: 7.5 g/dL — ABNORMAL LOW (ref 13.0–17.0)
Immature Granulocytes: 3 %
Lymphocytes Relative: 36 %
Lymphs Abs: 0.7 10*3/uL (ref 0.7–4.0)
MCH: 27.8 pg (ref 26.0–34.0)
MCHC: 31.6 g/dL (ref 30.0–36.0)
MCV: 87.8 fL (ref 80.0–100.0)
Monocytes Absolute: 0.1 10*3/uL (ref 0.1–1.0)
Monocytes Relative: 4 %
Neutro Abs: 1 10*3/uL — ABNORMAL LOW (ref 1.7–7.7)
Neutrophils Relative %: 52 %
Platelets: 171 10*3/uL (ref 150–400)
RBC: 2.7 MIL/uL — ABNORMAL LOW (ref 4.22–5.81)
RDW: 14.1 % (ref 11.5–15.5)
WBC: 1.9 10*3/uL — ABNORMAL LOW (ref 4.0–10.5)
nRBC: 0 % (ref 0.0–0.2)

## 2021-02-22 LAB — GLUCOSE, CAPILLARY
Glucose-Capillary: 106 mg/dL — ABNORMAL HIGH (ref 70–99)
Glucose-Capillary: 106 mg/dL — ABNORMAL HIGH (ref 70–99)
Glucose-Capillary: 92 mg/dL (ref 70–99)

## 2021-02-22 LAB — BASIC METABOLIC PANEL
Anion gap: 9 (ref 5–15)
BUN: 25 mg/dL — ABNORMAL HIGH (ref 8–23)
CO2: 24 mmol/L (ref 22–32)
Calcium: 8 mg/dL — ABNORMAL LOW (ref 8.9–10.3)
Chloride: 104 mmol/L (ref 98–111)
Creatinine, Ser: 6.99 mg/dL — ABNORMAL HIGH (ref 0.61–1.24)
GFR, Estimated: 7 mL/min — ABNORMAL LOW (ref >60)
Glucose, Bld: 87 mg/dL (ref 70–99)
Potassium: 3.5 mmol/L (ref 3.5–5.1)
Sodium: 137 mmol/L (ref 135–145)

## 2021-02-22 LAB — TYPE AND SCREEN
ABO/RH(D): AB POS
Antibody Screen: NEGATIVE
Unit division: 0
Unit division: 0

## 2021-02-22 LAB — CBC
HCT: 25.3 % — ABNORMAL LOW (ref 39.0–52.0)
Hemoglobin: 7.9 g/dL — ABNORMAL LOW (ref 13.0–17.0)
MCH: 27.5 pg (ref 26.0–34.0)
MCHC: 31.2 g/dL (ref 30.0–36.0)
MCV: 88.2 fL (ref 80.0–100.0)
Platelets: 169 10*3/uL (ref 150–400)
RBC: 2.87 MIL/uL — ABNORMAL LOW (ref 4.22–5.81)
RDW: 13.9 % (ref 11.5–15.5)
WBC: 5.1 10*3/uL (ref 4.0–10.5)
nRBC: 0 % (ref 0.0–0.2)

## 2021-02-22 LAB — HEPATITIS B SURFACE ANTIGEN: Hepatitis B Surface Ag: NONREACTIVE

## 2021-02-22 LAB — HEPATITIS B SURFACE ANTIBODY,QUALITATIVE: Hep B S Ab: NONREACTIVE

## 2021-02-22 MED ORDER — BOOST / RESOURCE BREEZE PO LIQD CUSTOM: 1.0000 | Freq: Three times a day (TID) | ORAL | Status: DC

## 2021-02-22 MED ORDER — RENA-VITE PO TABS
1.0000 | ORAL_TABLET | Freq: Every day | ORAL | Status: DC
  Administered 2021-02-22 – 2021-02-24 (×3): 1 via ORAL
  Filled 2021-02-22 (×3): qty 1

## 2021-02-22 MED ORDER — ASCORBIC ACID 500 MG PO TABS
500.0000 mg | ORAL_TABLET | Freq: Two times a day (BID) | ORAL | Status: DC
  Administered 2021-02-22 – 2021-02-25 (×5): 500 mg via ORAL
  Filled 2021-02-22 (×5): qty 1

## 2021-02-22 MED ORDER — SODIUM CHLORIDE 0.9% FLUSH
10.0000 mL | Freq: Two times a day (BID) | INTRAVENOUS | Status: DC
  Administered 2021-02-22 – 2021-02-24 (×6): 10 mL

## 2021-02-22 MED ORDER — PEG 3350-KCL-NA BICARB-NACL 420 G PO SOLR
4000.0000 mL | Freq: Once | ORAL | Status: AC
  Administered 2021-02-22: 4000 mL via ORAL
  Filled 2021-02-22: qty 4000

## 2021-02-22 MED ORDER — BISACODYL 5 MG PO TBEC
10.0000 mg | DELAYED_RELEASE_TABLET | Freq: Once | ORAL | Status: AC
  Administered 2021-02-22: 10 mg via ORAL
  Filled 2021-02-22: qty 2

## 2021-02-22 MED ORDER — HEPARIN SODIUM (PORCINE) 1000 UNIT/ML IJ SOLN
INTRAMUSCULAR | Status: AC
  Filled 2021-02-22: qty 1

## 2021-02-22 MED ORDER — ADULT MULTIVITAMIN W/MINERALS CH
1.0000 | ORAL_TABLET | Freq: Every day | ORAL | Status: DC
  Administered 2021-02-24: 1 via ORAL
  Filled 2021-02-22: qty 1

## 2021-02-22 MED ORDER — PROSOURCE PLUS PO LIQD
30.0000 mL | Freq: Three times a day (TID) | ORAL | Status: DC
  Administered 2021-02-24: 30 mL via ORAL
  Filled 2021-02-22 (×10): qty 30

## 2021-02-22 MED ORDER — DILTIAZEM HCL 25 MG/5ML IV SOLN
10.0000 mg | Freq: Once | INTRAVENOUS | Status: DC
  Filled 2021-02-22: qty 5

## 2021-02-22 NOTE — Progress Notes (Signed)
Patient's heart rate has been stabilized and has been normal.  Patient does not have any hypoxia and is on room air.  Therefore appropriate for EGD and colonoscopy.  Colonoscopy prep ordered and EGD and colonoscopy posted for tomorrow with Dr. Allen Norris

## 2021-02-22 NOTE — Progress Notes (Signed)
Patient with 3 hr. HD session, completes without incident. Targeted UF met, 1-liter fluid removed. Dressing to CVC changed, VSS, afebrile. No concerns, report given.

## 2021-02-22 NOTE — Consult Note (Addendum)
Subjective:   CC: cholecystostomy tube  HPI:  Gregory Dixon is a 80 y.o. male who was consulted by [facility] for evaluation of above.  Previously admitted for acute chole, chole tube placed due to comorbidities and has been doing well since.  Surgery consulted for discussion of drain management and possibly proceeding with surgery during this admission for GI bleed.  Pt denies any symptoms from GB standpoint.   Past Medical History:  has a past medical history of Actinic keratosis, Basal cell carcinoma (10/21/2017), Basal cell carcinoma (BCC) (05/12/2019), Blood transfusion without reported diagnosis, Diabetes mellitus without complication ([place]), and Dysrhythmia.  Past Surgical History:  has a past surgical history that includes Esophagogastroduodenoscopy (egd) with propofol (N/A, 04/29/2019); Colonoscopy with propofol (N/A, 04/29/2019); IR Perc Cholecystostomy (01/25/2021); and DIALYSIS/PERMA CATHETER INSERTION (Bilateral, 01/28/2021).  Family History: family history includes Hypertension in his father.  Social History:  reports that he has quit smoking. He has never used smokeless tobacco. He reports that he does not currently use alcohol. He reports that he does not use drugs.  Current Medications:  Prior to Admission medications   Medication Sig Start Date End Date Taking? Authorizing Provider  apixaban (ELIQUIS) 2.5 MG TABS tablet Take 1 tablet (2.5 mg total) by mouth 2 (two) times daily. 01/31/21  Yes Val Riles, MD  ascorbic acid (VITAMIN C) 500 MG tablet Take 1 tablet (500 mg total) by mouth daily. 02/01/21 03/03/21 Yes Val Riles, MD  carvedilol (COREG) 6.25 MG tablet Take 1 tablet (6.25 mg total) by mouth 2 (two) times daily with a meal. 01/31/21 03/02/21 Yes Val Riles, MD  diltiazem (CARDIZEM CD) 360 MG 24 hr capsule Take 1 capsule (360 mg total) by mouth daily. 02/01/21 03/03/21 Yes Val Riles, MD  ferrous sulfate 325 (65 FE) MG tablet Take 325 mg by mouth 2 (two) times  daily with a meal.   Yes [provider]  insulin NPH-regular Human (70-30) 100 UNIT/ML injection Inject 10-30 Units into the skin 2 (two) times daily with a meal.   Yes [provider]  isosorbide mononitrate (IMDUR) 30 MG 24 hr tablet Take 30 mg by mouth at bedtime. 03/14/19  Yes [provider]  Multiple Vitamin (MULTIVITAMIN) capsule Take 1 capsule by mouth daily.   Yes [provider]  sodium chloride flush (NS) 0.9 % SOLN 5 mLs by Intracatheter route every 12 (twelve) hours. 01/26/21 02/25/21 Yes Ester Hilley, DO  terazosin (HYTRIN) 10 MG capsule Take 10 mg by mouth at bedtime. 03/13/19  Yes [provider]  Vitamin D, Ergocalciferol, (DRISDOL) 1.25 MG (50000 UNIT) CAPS capsule Take 1 capsule (50,000 Units total) by mouth every 7 (seven) days. 02/07/21 05/08/21 Yes Val Riles, MD  fenofibrate 160 MG tablet Take 160 mg by mouth at bedtime.    [provider]  pantoprazole (PROTONIX) 40 MG tablet Take 1 tablet (40 mg total) by mouth daily. Patient not taking: Reported on 02/13/2021 11/25/20 12/25/20  Loletha Grayer, MD    Allergies:  Allergies  Allergen Reactions   Aspirin Other (See Comments)    GI bleed   Codeine Nausea Only and Nausea And Vomiting    ROS:  General: Denies weight loss, weight gain, fatigue, fevers, chills, and night sweats. Eyes: Denies blurry vision, double vision, eye pain, itchy eyes, and tearing. Ears: Denies hearing loss, earache, and ringing in ears. Nose: Denies sinus pain, congestion, infections, runny nose, and nosebleeds. Mouth/throat: Denies hoarseness, sore throat, bleeding gums, and difficulty swallowing. Heart: Denies chest pain, palpitations,  racing heart, irregular heartbeat, leg pain or swelling, and decreased activity tolerance. Respiratory: Denies breathing difficulty, shortness of breath, wheezing, cough, and sputum. GI: Denies change in appetite, heartburn, nausea, vomiting, constipation,  diarrhea, and blood in stool. GU: Denies difficulty urinating, pain with urinating, urgency, frequency, blood in urine. Musculoskeletal: Denies joint stiffness, pain, swelling, muscle weakness. Skin: Denies rash, itching, mass, tumors, sores, and boils Neurologic: Denies headache, fainting, dizziness, seizures, numbness, and tingling. Psychiatric: Denies depression, anxiety, difficulty sleeping, and memory loss. Endocrine: Denies heat or cold intolerance, and increased thirst or urination. Blood/lymph: Denies easy bruising, easy bruising, and swollen glands     Objective:     BP 107/69   Pulse 88   Temp 98.2 F (36.8 C) (Oral)   Resp 20   Ht 6\' 6"  (1.981 m)   Wt 93.4 kg   SpO2 98%   BMI 23.81 kg/m   Constitutional :  alert, cooperative, appears stated age, no distress, and pale  Lymphatics/Throat:  no asymmetry, masses, or scars  Respiratory:  clear to auscultation bilaterally  Cardiovascular:  irregularly irregular rhythm  Gastrointestinal: soft, non-tender; bowel sounds normal; no masses,  no organomegaly. Chole tube with somewhat thin bilious output  Musculoskeletal: Steady gait and movement  Skin: Cool and moist, no surgical scars  Psychiatric: Normal affect, non-agitated, not confused       LABS:  CMP Latest Ref Rng & Units 02/22/2021 02/21/2021 02/20/2021  Glucose 70 - 99 mg/dL 87 99 154(H)  BUN 8 - 23 mg/dL 25(H) 17 14  Creatinine 0.61 - 1.24 mg/dL 6.99(H) 5.54(H) 4.49(H)  Sodium 135 - 145 mmol/L 137 137 136  Potassium 3.5 - 5.1 mmol/L 3.5 4.0 3.7  Chloride 98 - 111 mmol/L 104 101 102  CO2 22 - 32 mmol/L 24 26 24   Calcium 8.9 - 10.3 mg/dL 8.0(L) 8.2(L) 7.8(L)  Total Protein 6.5 - 8.1 g/dL - - 6.3(L)  Total Bilirubin 0.3 - 1.2 mg/dL - - 0.6  Alkaline Phos 38 - 126 U/L - - 36(L)  AST 15 - 41 U/L - - 22  ALT 0 - 44 U/L - - 10   CBC Latest Ref Rng & Units 02/22/2021 02/21/2021 02/21/2021  WBC 4.0 - 10.5 K/uL 5.1 5.1 4.9  Hemoglobin 13.0 - 17.0 g/dL 7.9(L)  7.4(L) 7.6(L)  Hematocrit 39.0 - 52.0 % 25.3(L) 23.4(L) 24.1(L)  Platelets 150 - 400 K/uL 169 175 174    RADS: N/a  Assessment:      Acute cholecystitis, interval chole tube placement Incidental COVID positive  Plan:    Pt currently undergoing management of GI bleed.  Recommend stabilization of bleed, and medical optimization prior to consideration of interval robotic assisted lap chole.  There is no urgency to proceed with surgery at this time due to adequately decompressing cholecystostomy tube, even with his use of eliquis. Surgery will peripherally follow for now.

## 2021-02-22 NOTE — TOC Initial Note (Signed)
Transition of Care [facility]) - Initial/Assessment Note    Patient Details  Name: Gregory Dixon MRN: [id number] Date of Birth: 07/16/1940  Transition of Care [facility]) CM/SW Contact:    Candie Chroman, LCSW Phone Number: 02/22/2021, 1:23 PM  Clinical Narrative:   Readmission prevention screen complete. Patient on COVID isolation precautions. CSW called patient in the room, introduced role, and explained that discharge planning would be discussed. PCP is Johny Drilling, MD at [facility] in [place]. Wife drives him to appointments and HD. Pharmacy is [facility] in [place]. No issues obtaining medications. No home health prior to admission but patient is interested in starting services. He reports increased weakness over the past two weeks and has had to start using a wheelchair borrowed from a friend. He has an Transport planner that he previously purchased. MD will enter PT and OT orders. No further concerns. CSW encouraged patient to contact CSW as needed. CSW will continue to follow patient for support and facilitate return home when stable.               Expected Discharge Plan: [place] Barriers to Discharge: Continued Medical Work up   Patient Goals and CMS Choice        Expected Discharge Plan and Services Expected Discharge Plan: [place] Choice: Durable Medical Equipment, Home Health Living arrangements for the past 2 months: Single Family Home                                      Prior Living Arrangements/Services Living arrangements for the past 2 months: Single Family Home Lives with:: Spouse Patient language and need for interpreter reviewed:: Yes Do you feel safe going back to the place where you live?: Yes      Need for Family Participation in Patient Care: Yes (Comment) Care giver support system in place?: Yes (comment) Current home services: DME Criminal Activity/Legal Involvement Pertinent to Current  Situation/Hospitalization: No - Comment as needed  Activities of Daily Living Home Assistive Devices/Equipment: Environmental consultant (specify type), Cane (specify quad or straight) ADL Screening (condition at time of admission) Patient's cognitive ability adequate to safely complete daily activities?: Yes Is the patient deaf or have difficulty hearing?: No Does the patient have difficulty seeing, even when wearing glasses/contacts?: Yes Does the patient have difficulty concentrating, remembering, or making decisions?: No Patient able to express need for assistance with ADLs?: Yes Does the patient have difficulty dressing or bathing?: Yes Independently performs ADLs?: No Communication: Independent Dressing (OT): Independent Grooming: Independent Feeding: Independent Bathing: Needs assistance Is this a change from baseline?: Pre-admission baseline Toileting: Independent Is this a change from baseline?: Pre-admission baseline In/Out Bed: Independent Is this a change from baseline?: Pre-admission baseline Walks in Home: Independent with device (comment) Does the patient have difficulty walking or climbing stairs?: Yes Weakness of Legs: Both Weakness of Arms/Hands: None  Permission Sought/Granted                  Emotional Assessment Appearance:: Appears stated age Attitude/Demeanor/Rapport: Engaged, Gracious Affect (typically observed): Accepting, Appropriate, Calm, Pleasant Orientation: : Oriented to Self, Oriented to Place, Oriented to  Time, Oriented to Situation Alcohol / Substance Use: Not Applicable Psych Involvement: No (comment)  Admission diagnosis:  Rectal bleeding [K62.5] ESRD on dialysis ([place]) [N18.6, Z99.2] Symptomatic anemia [D64.9] Acute anemia [D64.9] Patient Active  Problem List   Diagnosis Date Noted   Symptomatic anemia 02/21/2021   Cholecystitis 01/24/2021   Weakness 01/23/2021   Elevated LFTs 01/23/2021   Transaminitis 01/23/2021   MGUS (monoclonal gammopathy  of unknown significance)    Thrombocytopenia (HCC)    Acute anemia 11/23/2020   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   Chronic kidney disease 05/11/2019   Hyperlipidemia 05/11/2019   Hypertension, 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   Anemia due to vitamin B12 deficiency 05/05/2019   Benign prostatic hyperplasia 05/05/2019   Acute lower GI bleeding 04/27/2019   Acute blood loss anemia 04/27/2019   Acute kidney injury superimposed on CKD ([place]) 04/27/2019   Iron deficiency anemia due to chronic blood loss 04/27/2019   Type 2 diabetes mellitus with stage 4 chronic kidney disease, without long-term current use of insulin ([place]) 10/16/2015   Essential hypertension 10/16/2015   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[place]) 12/06/2013   Atherosclerosis 09/29/2012   Cholelithiasis 09/29/2012   Nephrolithiasis 09/29/2012   Diastasis recti 08/23/2012   Diabetic peripheral neuropathy associated with type 2 diabetes mellitus ([place]) 02/20/2011   Heart murmur, systolic 39/53/2023   Loss of feeling or sensation 02/20/2011   Obesity 02/20/2011   PCP:  Valera Castle, MD Pharmacy:   [facility] [phone] - [address] [place] [address] Phone: [phone] Fax: [phone]     Social Determinants of Health ([place]) Interventions    Readmission Risk Interventions Readmission Risk Prevention Plan 02/22/2021  Transportation Screening Complete  PCP or Specialist Appt within 5-7 Days Complete  Home Care Screening (No Data)  Medication Review (RN CM) Complete  Some recent data might be hidden

## 2021-02-22 NOTE — Progress Notes (Signed)
[facility]  ROUNDING NOTE   Subjective:   Seen and examined on hemodialysis treatment.   Objective:  Vital signs in last 24 hours:  Temp:  [97.7 F (36.5 C)-98.7 F (37.1 C)] 98.2 F (36.8 C) (11/18 0811) Pulse Rate:  [77-98] 88 (11/18 0811) Resp:  [16-23] 20 (11/18 0811) BP: (91-118)/(54-70) 107/69 (11/18 0946) SpO2:  [94 %-100 %] 98 % (11/18 0811) Weight:  [93.4 kg] 93.4 kg (11/17 2104)  Weight change: -0.859 kg Filed Weights   02/20/21 1949 02/21/21 2104  Weight: 94.3 kg 93.4 kg    Intake/Output: I/O last 3 completed shifts: In: 30 [Blood:310; IV Piggyback:500] Out: 0    Intake/Output this shift:  Total I/O In: -  Out: 250 [Drains:250]  Physical Exam: General: NAD, laying in bed  Head: Normocephalic, atraumatic. Moist oral mucosal membranes  Eyes: Anicteric, PERRL  Neck: Supple, trachea midline  Lungs:  Clear to auscultation  Heart: Irregular, tachycardia  Abdomen:  Soft, nontender, RUQ drain   Extremities:  no peripheral edema.  Neurologic: Nonfocal, moving all four extremities  Skin: No lesions  Access: RIJ permcath    Basic Metabolic Panel: Recent Labs  Lab 02/20/21 2022 02/21/21 0830 02/22/21 0435  NA 136 137 137  K 3.7 4.0 3.5  CL 102 101 104  CO2 24 26 24   GLUCOSE 154* 99 87  BUN 14 17 25*  CREATININE 4.49* 5.54* 6.99*  CALCIUM 7.8* 8.2* 8.0*     Liver Function Tests: Recent Labs  Lab 02/20/21 2022  AST 22  ALT 10  ALKPHOS 36*  BILITOT 0.6  PROT 6.3*  ALBUMIN 2.7*    No results for input(s): LIPASE, AMYLASE in the last 168 hours. No results for input(s): AMMONIA in the last 168 hours.  CBC: Recent Labs  Lab 02/20/21 2022 02/21/21 0251 02/21/21 0830 02/21/21 1658 02/22/21 0435  WBC 5.1  --  4.9 5.1 5.1  HGB 7.2* 7.3* 7.6* 7.4* 7.9*  HCT 23.4* 23.0* 24.1* 23.4* 25.3*  MCV 89.7  --  89.6 88.6 88.2  PLT 186  --  174 175 169     Cardiac Enzymes: No results for input(s): CKTOTAL, CKMB, CKMBINDEX,  TROPONINI in the last 168 hours.  BNP: Invalid input(s): POCBNP  CBG: Recent Labs  Lab 02/21/21 1157 02/21/21 1804 02/21/21 2106 02/22/21 0808 02/22/21 1211  GLUCAP 148* 101* 158* 106* 106*     Microbiology: Results for orders placed or performed during the hospital encounter of 02/20/21  Resp Panel by RT-PCR (Flu A&B, Covid) Nasopharyngeal Swab     Status: Abnormal   Collection Time: 02/20/21  8:22 PM   Specimen: Nasopharyngeal Swab; Nasopharyngeal(NP) swabs in vial transport medium  Result Value Ref Range Status   SARS Coronavirus 2 by RT PCR POSITIVE (A) NEGATIVE Final    Comment: RESULT CALLED TO, READ BACK BY AND VERIFIED WITH: WHITNEY HICKS @2203  ON 02/20/21 (NOTE) SARS-CoV-2 target nucleic acids are DETECTED.  The SARS-CoV-2 RNA is generally detectable in upper respiratory specimens during the acute phase of infection. Positive results are indicative of the presence of the identified virus, but do not rule out bacterial infection or co-infection with other pathogens not detected by the test. Clinical correlation with patient history and other diagnostic information is necessary to determine patient infection status. The expected result is Negative.  Fact Sheet for Patients: EntrepreneurPulse.com.au  Fact Sheet for Healthcare Providers: IncredibleEmployment.be  This test is not yet approved or cleared by the [place] FDA and  has been  authorized for detection and/or diagnosis of SARS-CoV-2 by FDA under an Emergency Use Authorization (EUA).  This EUA will remain in effect (meaning this test can be u sed) for the duration of  the COVID-19 declaration under Section 564(b)(1) of the Act, 21 U.S.C. section 360bbb-3(b)(1), unless the authorization is terminated or revoked sooner.     Influenza A by PCR NEGATIVE NEGATIVE Final   Influenza B by PCR NEGATIVE NEGATIVE Final    Comment: (NOTE) The Xpert Xpress  SARS-CoV-2/FLU/RSV plus assay is intended as an aid in the diagnosis of influenza from Nasopharyngeal swab specimens and should not be used as a sole basis for treatment. Nasal washings and aspirates are unacceptable for Xpert Xpress SARS-CoV-2/FLU/RSV testing.  Fact Sheet for Patients: EntrepreneurPulse.com.au  Fact Sheet for Healthcare Providers: IncredibleEmployment.be  This test is not yet approved or cleared by the [place] FDA and has been authorized for detection and/or diagnosis of SARS-CoV-2 by FDA under an Emergency Use Authorization (EUA). This EUA will remain in effect (meaning this test can be used) for the duration of the COVID-19 declaration under Section 564(b)(1) of the Act, 21 U.S.C. section 360bbb-3(b)(1), unless the authorization is terminated or revoked.  Performed at [facility], [address] [place], [place] [address]     Coagulation Studies: No results for input(s): LABPROT, INR in the last 72 hours.  Urinalysis: No results for input(s): COLORURINE, LABSPEC, PHURINE, GLUCOSEU, HGBUR, BILIRUBINUR, KETONESUR, PROTEINUR, UROBILINOGEN, NITRITE, LEUKOCYTESUR in the last 72 hours.  Invalid input(s): APPERANCEUR    Imaging: CT ABDOMEN PELVIS WO CONTRAST  Result Date: 02/20/2021 CLINICAL DATA:  Abdominal trauma syncope, fell and pulled cholecystostomy drain EXAM: CT ABDOMEN AND PELVIS WITHOUT CONTRAST TECHNIQUE: Multidetector CT imaging of the abdomen and pelvis was performed following the standard protocol without IV contrast. COMPARISON:  01/23/2021 FINDINGS: Lower chest: No acute abnormality. Hepatobiliary: Cholecystostomy drain remains in place in a decompressed gallbladder. 10 mm gallstone noted. No focal hepatic abnormality. Pancreas: No focal abnormality or ductal dilatation. Spleen: No focal abnormality.  Normal size. Adrenals/Urinary Tract: Bilateral renal cysts, the largest in the upper pole of the  left kidney measuring 4 cm. 6 mm nonobstructing left lower pole renal stone. No hydronephrosis. Adrenal glands and urinary bladder unremarkable. Stomach/Bowel: Normal appendix. Stomach, large and small bowel grossly unremarkable. Vascular/Lymphatic: Aortic atherosclerosis. No evidence of aneurysm or adenopathy. Reproductive: Prostate enlargement Other: No free fluid or free air. Musculoskeletal: No acute bony abnormality. IMPRESSION: No evidence of injury. Cholecystostomy tube remains in decompressed gallbladder. Cholelithiasis. Left nephrolithiasis.  No hydronephrosis. Aortic atherosclerosis. Prostate enlargement. Electronically Signed   By: Rolm Baptise M.D.   On: 02/20/2021 20:44   CT HEAD WO CONTRAST (5MM)  Result Date: 02/20/2021 CLINICAL DATA:  Fall, head injury EXAM: CT HEAD WITHOUT CONTRAST TECHNIQUE: Contiguous axial images were obtained from the base of the skull through the vertex without intravenous contrast. COMPARISON:  None. FINDINGS: Brain: Small hypodensity in the anterior limb internal capsule on the right likely of chronic ischemic insult. No other areas of infarction. No acute hemorrhage or mass. Ventricle size normal. Vascular: Negative for hyperdense vessel Skull: Negative Sinuses/Orbits: Mucosal edema paranasal sinuses. Bilateral cataract extraction Other: None IMPRESSION: No acute abnormality. Small hypodensity right internal capsule most consistent with chronic ischemia. Electronically Signed   By: Franchot Gallo M.D.   On: 02/20/2021 20:40   DG Chest Portable 1 View  Result Date: 02/20/2021 CLINICAL DATA:  Syncope EXAM: PORTABLE CHEST 1 VIEW COMPARISON:  01/23/2021 FINDINGS: Interval placement of  right jugular central venous catheter with the tip in the lower SVC. No pneumothorax. Mild elevation right hemidiaphragm unchanged. Minimal right lower lobe atelectasis. Left lung clear. Negative for heart failure edema or effusion. IMPRESSION: Elevated right hemidiaphragm with mild right  lower lobe atelectasis Central venous catheter tip in the SVC without pneumothorax. Electronically Signed   By: Franchot Gallo M.D.   On: 02/20/2021 20:36     Medications:    sodium chloride      sodium chloride   Intravenous Once   bisacodyl  10 mg Oral Once   carvedilol  6.25 mg Oral BID WC   Chlorhexidine Gluconate Cloth  6 each Topical Q0600   diltiazem  360 mg Oral Daily   diltiazem  10 mg Intravenous Once   insulin aspart  0-5 Units Subcutaneous QHS   insulin aspart  0-6 Units Subcutaneous TID WC   polyethylene glycol-electrolytes  4,000 mL Oral Once   sodium chloride flush  10 mL Intracatheter Q12H   acetaminophen **OR** acetaminophen, ondansetron **OR** ondansetron (ZOFRAN) IV  Assessment/ Plan:  [name] is a 80 y.o. white male with end stage renal disease on hemodialysis, hypertension, hyperlipidemia, atrial fibrillation, diabetes mellitus type II who is admitted to [facility] on 02/20/2021 for Rectal bleeding [K62.5] ESRD on dialysis ([place]) [N18.6, Z99.2] Symptomatic anemia [D64.9] Acute anemia [D64.9]  [facility] RIJ permcath 95.5kg  End Stage Renal Disease: MWF schedule. Dialysis today.   Hypertension with chronic kidney disease: with Atrial fibrillation with RVR. Concern for syncopal episode.  - restarted on diltiazem and carvedilol.  - Appreciate cardiology input  Anemia with chronic kidney disease: hemoglobin 7.6. Receiving mircera 60 units with outpatient dialysis. Now with lower GI bleed. Outpatient labs consistent with iron deficiency.  - Appreciate GI input. Endoscopy scheduled for tomorrow.   Secondary Hyperparathyroidism: outpatient labs from 11/14 with Phos 5.1, calcium 8, PTH elevated at 720. Not currently on binders.    LOS: 1 Milayah Krell 11/18/20223:02 PM

## 2021-02-22 NOTE — Progress Notes (Signed)
Hemodialysis patient known at [place] MWF 7:00am. Education provided, no dialysis concerns stated, wife provides transportation to treatments. Please contact me with any dialysis placement concerns.  Elvera Bicker Dialysis Coordinator [phone]

## 2021-02-22 NOTE — Progress Notes (Signed)
Initial Nutrition Assessment  DOCUMENTATION CODES:   Severe malnutrition in context of chronic illness  INTERVENTION:   Boost Breeze po TID, each supplement provides 250 kcal and 9 grams of protein  Pro-Source 8m TID via tube, provides 40kcal and 11g of protein per serving   MVI po daily   Rena-vit po daily   Vitamin C 5035mpo BID  Pt at high refeed risk; recommend monitor potassium, magnesium and phosphorus labs daily until stable  NUTRITION DIAGNOSIS:   Severe Malnutrition related to chronic illness (ESRD on HD, Afib, DM) as evidenced by severe fat depletion, severe muscle depletion, 20 percent weight loss in months.  GOAL:   Patient will meet greater than or equal to 90% of their needs  MONITOR:   PO intake, Supplement acceptance, Labs, Weight trends, Skin, I & O's  REASON FOR ASSESSMENT:   Malnutrition Screening Tool    ASSESSMENT:   Gregory Dixon male with h/o ESRD on HD, HTN, Afib, DM, MGUS and cholecystitis status post IR percutaneous cholecystostomy placement 10/21 who is admitted with COVID 19 and GIB.  Met with pt in room today. Pt reports poor appetite and oral intake for several months pta r/t taste changes. Pt reports that food tastes so bad that he can barely swallow it; pt also reports that he has been unable to drink any supplements. Pt reports a 48lb weight loss over the past month. Per chart, pt is down 50lbs(20%) since September; this is severe weight loss. RD discussed with pt the importance of adequate nutrition needed to preserve lean muscle. Pt reports that he is willing to try ProSource Plus and Boost Breeze. RD will add supplements and vitamins to help pt meet his estimated needs and to replace losses from HD. Pt admitted with GI bleed and reports bruising easily; RD will add vitamin C as pt at increased risk for scurvy. Pt currently on clear liquid diet; pt reports eating jello and broth for lunch today. Plan is for EGD/colonoscopy tomorrow.    Medications reviewed and include: dulcolax, insulin  Labs reviewed: K 3.5 wnl, BUN 25(H), creat 6.99(H) Hgb 7.9(L), Hct 25.3(L) Cbgs- 106, 106 x 24 hrs AIC 5.6 wnl- 10/21  NUTRITION - FOCUSED PHYSICAL EXAM:  Flowsheet Row Most Recent Value  Orbital Region Moderate depletion  Upper Arm Region Severe depletion  Thoracic and Lumbar Region Severe depletion  Buccal Region Moderate depletion  Temple Region Moderate depletion  Clavicle Bone Region Severe depletion  Clavicle and Acromion Bone Region Severe depletion  Scapular Bone Region Moderate depletion  Dorsal Hand Severe depletion  Patellar Region Severe depletion  Anterior Thigh Region Severe depletion  Posterior Calf Region Severe depletion  Edema (RD Assessment) Mild  Hair Reviewed  Eyes Reviewed  Mouth Reviewed  Skin Reviewed  Nails Reviewed   Diet Order:   Diet Order             Diet NPO time specified Except for: Sips with Meds  Diet effective 0500           Diet clear liquid Room service appropriate? Yes; Fluid consistency: Thin  Diet effective now                  EDUCATION NEEDS:   Education needs have been addressed  Skin:  Skin Assessment: Reviewed RN Assessment  Last BM:  11/18- TYPE 7  Height:   Ht Readings from Last 1 Encounters:  02/21/21 6' 6"  (1.981 m)    Weight:   Wt Readings from  Last 1 Encounters:  02/21/21 93.4 kg    Ideal Body Weight:  97 kg  BMI:  Body mass index is 23.81 kg/m.  Estimated Nutritional Needs:   Kcal:  2500-2800kcal/day  Protein:  >125g/day  Fluid:  2.3-2.6L/day  Koleen Distance MS, RD, LDN Please refer to [facility] for RD and/or RD on-call/weekend/after hours pager

## 2021-02-22 NOTE — Progress Notes (Signed)
PROGRESS NOTE    Gregory Dixon  WUJ:[id number] DOB: 1941-01-02 DOA: 02/20/2021 PCP: Valera Castle, MD   Chief Complaint  Patient presents with   Fall    Brief Narrative:    Gregory Dixon is a 80 y.o. male with medical history significant for MGUS, iron deficiency anemia, end-stage renal disease on hemodialysis via right upper anterior PermCath, cholecystitis status post IR percutaneous cholecystostomy placement, insulin-dependent diabetes mellitus, hypertension, GERD, atrial fibrillation, on anticoagulation, who presents emergency department for chief concerns of frequent falling, weakness, his work-up significant for anemia with hemoglobin of 7.2, he does endorse bright red blood per rectum, he was admitted for further work-up.   Assessment & Plan:   Principal Problem:   Acute blood loss anemia Active Problems:   Acute lower GI bleeding   Essential hypertension   Benign prostatic hyperplasia   Hyperlipidemia   Anemia associated with chronic renal failure   Acute anemia   MGUS (monoclonal gammopathy of unknown significance)   Weakness   Symptomatic anemia    Symptomatic anemia/acute blood loss anemia-presumed secondary to lower GI bleed -Hemoglobin at 7.2, so far he is transfused 2 units PRBC, hemoglobin is 7.9 this morning, which is lower than expected increase, which still indicates he still having bleed, will continue to monitor closely and transfuse further if needed . -GI input greatly appreciated, plan for endoscopy, will hold his Eliquis, allow 2 to 3 days to washout as discussed with GI, likely plan to start bowel prep tomorrow. -Continue to hold Eliquis   Insulin-dependent diabetes mellitus - insulin SSI with at bedtime coverage ordered - Hemoglobin A1c was 5.6 on 01/25/2021   End-stage renal disease on hemodialysis via permacath - Renal consulted, plan for hemodialysis today, to continue Monday Wednesday Friday schedule.     Atrial fibrillation with  RVR -Heart rate is better controlled currently now he is back on his home dose Cardizem and Coreg, he did require as needed Cardizem pushes yesterday.   -Continue to hold Eliquis due to GI bleed .   Hypertension -Cardizem and Coreg were resumed for heart rate control.Marland Kitchen   COVID 19 infection - Initially positive on 11/9, he is positive again today, asymptomatic, no hypoxia, no pneumonia on imaging, no indication for treatment, will DC quarantine after 10 days on 11/20.  History of cholecystitis -Status post percutaneous cholecystostomy drain, discussed with general surgery, no plan for surgery this admission. -IR input greatly appreciated, continue with drain flushing/care.  DVT prophylaxis: Eliquis on hold for GI bleed. Code Status: DNR Family Communication: Discussed with wife at bedside Disposition:   Status is: Inpatient  Remains inpatient appropriate because:work-up for GI bleed       Consultants:  Renal GI Gen surgery  Subjective:  He denies any nausea, vomiting or abdominal pain, patient still reports blood with bowel movements.  Objective: Vitals:   02/21/21 2104 02/22/21 0458 02/22/21 0811 02/22/21 0946  BP: (!) 117/58 118/70 106/70 107/69  Pulse: 92 77 88   Resp: 16 17 20    Temp: 97.9 F (36.6 C) 98 F (36.7 C) 98.2 F (36.8 C)   TempSrc: Oral Oral Oral   SpO2: 100% 99% 98%   Weight: 93.4 kg     Height: 6\' 6"  (1.981 m)       Intake/Output Summary (Last 24 hours) at 02/22/2021 1319 Last data filed at 02/22/2021 0813 Gross per 24 hour  Intake --  Output 250 ml  Net -250 ml   Filed Weights   02/20/21 1949 02/21/21  2104  Weight: 94.3 kg 93.4 kg    Examination:  Awake Alert, Oriented X 3, No new F.N deficits, Normal affect Symmetrical Chest wall movement, Good air movement bilaterally, CTAB RRR,No Gallops,Rubs or new Murmurs, No Parasternal Heave +ve B.Sounds, Abd Soft, No tenderness, No rebound - guarding or rigidity. No Cyanosis, Clubbing or  edema, No new Rash or bruise      Data Reviewed: I have personally reviewed following labs and imaging studies  CBC: Recent Labs  Lab 02/20/21 2022 02/21/21 0251 02/21/21 0830 02/21/21 1658 02/22/21 0435  WBC 5.1  --  4.9 5.1 5.1  HGB 7.2* 7.3* 7.6* 7.4* 7.9*  HCT 23.4* 23.0* 24.1* 23.4* 25.3*  MCV 89.7  --  89.6 88.6 88.2  PLT 186  --  174 175 408    Basic Metabolic Panel: Recent Labs  Lab 02/20/21 2022 02/21/21 0830 02/22/21 0435  NA 136 137 137  K 3.7 4.0 3.5  CL 102 101 104  CO2 24 26 24   GLUCOSE 154* 99 87  BUN 14 17 25*  CREATININE 4.49* 5.54* 6.99*  CALCIUM 7.8* 8.2* 8.0*    GFR: Estimated Creatinine Clearance: 11.1 mL/min (A) (by C-G formula based on SCr of 6.99 mg/dL (H)).  Liver Function Tests: Recent Labs  Lab 02/20/21 2022  AST 22  ALT 10  ALKPHOS 36*  BILITOT 0.6  PROT 6.3*  ALBUMIN 2.7*    CBG: Recent Labs  Lab 02/21/21 1157 02/21/21 1804 02/21/21 2106 02/22/21 0808 02/22/21 1211  GLUCAP 148* 101* 158* 106* 106*     Recent Results (from the past 240 hour(s))  Resp Panel by RT-PCR (Flu A&B, Covid) Nasopharyngeal Swab     Status: Abnormal   Collection Time: 02/13/21 10:52 AM   Specimen: Nasopharyngeal Swab; Nasopharyngeal(NP) swabs in vial transport medium  Result Value Ref Range Status   SARS Coronavirus 2 by RT PCR POSITIVE (A) NEGATIVE Final    Comment: RESULT CALLED TO, READ BACK BY AND VERIFIED WITH: Edwin Dada, RN [phone] 02/13/21 GM (NOTE) SARS-CoV-2 target nucleic acids are DETECTED.  The SARS-CoV-2 RNA is generally detectable in upper respiratory specimens during the acute phase of infection. Positive results are indicative of the presence of the identified virus, but do not rule out bacterial infection or co-infection with other pathogens not detected by the test. Clinical correlation with patient history and other diagnostic information is necessary to determine patient infection status. The expected result is  Negative.  Fact Sheet for Patients: EntrepreneurPulse.com.au  Fact Sheet for Healthcare Providers: IncredibleEmployment.be  This test is not yet approved or cleared by the [place] FDA and  has been authorized for detection and/or diagnosis of SARS-CoV-2 by FDA under an Emergency Use Authorization (EUA).  This EUA will remain in effect (meaning this test can be [place] ed) for the duration of  the COVID-19 declaration under Section 564(b)(1) of the Act, 21 U.S.C. section 360bbb-3(b)(1), unless the authorization is terminated or revoked sooner.     Influenza A by PCR NEGATIVE NEGATIVE Final   Influenza B by PCR NEGATIVE NEGATIVE Final    Comment: (NOTE) The Xpert Xpress SARS-CoV-2/FLU/RSV plus assay is intended as an aid in the diagnosis of influenza from Nasopharyngeal swab specimens and should not be used as a sole basis for treatment. Nasal washings and aspirates are unacceptable for Xpert Xpress SARS-CoV-2/FLU/RSV testing.  Fact Sheet for Patients: EntrepreneurPulse.com.au  Fact Sheet for Healthcare Providers: IncredibleEmployment.be  This test is not yet approved or cleared by the [place]  FDA and has been authorized for detection and/or diagnosis of SARS-CoV-2 by FDA under an Emergency Use Authorization (EUA). This EUA will remain in effect (meaning this test can be used) for the duration of the COVID-19 declaration under Section 564(b)(1) of the Act, 21 U.S.C. section 360bbb-3(b)(1), unless the authorization is terminated or revoked.  Performed at [facility], [address] [place], [place] [address]   Resp Panel by RT-PCR (Flu A&B, Covid) Nasopharyngeal Swab     Status: Abnormal   Collection Time: 02/20/21  8:22 PM   Specimen: Nasopharyngeal Swab; Nasopharyngeal(NP) swabs in vial transport medium  Result Value Ref Range Status   SARS Coronavirus 2 by RT PCR POSITIVE (A)  NEGATIVE Final    Comment: RESULT CALLED TO, READ BACK BY AND VERIFIED WITH: WHITNEY HICKS @2203  ON 02/20/21 (NOTE) SARS-CoV-2 target nucleic acids are DETECTED.  The SARS-CoV-2 RNA is generally detectable in upper respiratory specimens during the acute phase of infection. Positive results are indicative of the presence of the identified virus, but do not rule out bacterial infection or co-infection with other pathogens not detected by the test. Clinical correlation with patient history and other diagnostic information is necessary to determine patient infection status. The expected result is Negative.  Fact Sheet for Patients: EntrepreneurPulse.com.au  Fact Sheet for Healthcare Providers: IncredibleEmployment.be  This test is not yet approved or cleared by the [place] FDA and  has been authorized for detection and/or diagnosis of SARS-CoV-2 by FDA under an Emergency Use Authorization (EUA).  This EUA will remain in effect (meaning this test can be u sed) for the duration of  the COVID-19 declaration under Section 564(b)(1) of the Act, 21 U.S.C. section 360bbb-3(b)(1), unless the authorization is terminated or revoked sooner.     Influenza A by PCR NEGATIVE NEGATIVE Final   Influenza B by PCR NEGATIVE NEGATIVE Final    Comment: (NOTE) The Xpert Xpress SARS-CoV-2/FLU/RSV plus assay is intended as an aid in the diagnosis of influenza from Nasopharyngeal swab specimens and should not be used as a sole basis for treatment. Nasal washings and aspirates are unacceptable for Xpert Xpress SARS-CoV-2/FLU/RSV testing.  Fact Sheet for Patients: EntrepreneurPulse.com.au  Fact Sheet for Healthcare Providers: IncredibleEmployment.be  This test is not yet approved or cleared by the [place] FDA and has been authorized for detection and/or diagnosis of SARS-CoV-2 by FDA under an Emergency Use Authorization  (EUA). This EUA will remain in effect (meaning this test can be used) for the duration of the COVID-19 declaration under Section 564(b)(1) of the Act, 21 U.S.C. section 360bbb-3(b)(1), unless the authorization is terminated or revoked.  Performed at [facility], [address] [place], [place] [address]          Radiology Studies: CT ABDOMEN PELVIS WO CONTRAST  Result Date: 02/20/2021 CLINICAL DATA:  Abdominal trauma syncope, fell and pulled cholecystostomy drain EXAM: CT ABDOMEN AND PELVIS WITHOUT CONTRAST TECHNIQUE: Multidetector CT imaging of the abdomen and pelvis was performed following the standard protocol without IV contrast. COMPARISON:  01/23/2021 FINDINGS: Lower chest: No acute abnormality. Hepatobiliary: Cholecystostomy drain remains in place in a decompressed gallbladder. 10 mm gallstone noted. No focal hepatic abnormality. Pancreas: No focal abnormality or ductal dilatation. Spleen: No focal abnormality.  Normal size. Adrenals/Urinary Tract: Bilateral renal cysts, the largest in the upper pole of the left kidney measuring 4 cm. 6 mm nonobstructing left lower pole renal stone. No hydronephrosis. Adrenal glands and urinary bladder unremarkable. Stomach/Bowel: Normal appendix. Stomach, large and small  bowel grossly unremarkable. Vascular/Lymphatic: Aortic atherosclerosis. No evidence of aneurysm or adenopathy. Reproductive: Prostate enlargement Other: No free fluid or free air. Musculoskeletal: No acute bony abnormality. IMPRESSION: No evidence of injury. Cholecystostomy tube remains in decompressed gallbladder. Cholelithiasis. Left nephrolithiasis.  No hydronephrosis. Aortic atherosclerosis. Prostate enlargement. Electronically Signed   By: Rolm Baptise M.D.   On: 02/20/2021 20:44   CT HEAD WO CONTRAST (5MM)  Result Date: 02/20/2021 CLINICAL DATA:  Fall, head injury EXAM: CT HEAD WITHOUT CONTRAST TECHNIQUE: Contiguous axial images were obtained from the base of the  skull through the vertex without intravenous contrast. COMPARISON:  None. FINDINGS: Brain: Small hypodensity in the anterior limb internal capsule on the right likely of chronic ischemic insult. No other areas of infarction. No acute hemorrhage or mass. Ventricle size normal. Vascular: Negative for hyperdense vessel Skull: Negative Sinuses/Orbits: Mucosal edema paranasal sinuses. Bilateral cataract extraction Other: None IMPRESSION: No acute abnormality. Small hypodensity right internal capsule most consistent with chronic ischemia. Electronically Signed   By: Franchot Gallo M.D.   On: 02/20/2021 20:40   DG Chest Portable 1 View  Result Date: 02/20/2021 CLINICAL DATA:  Syncope EXAM: PORTABLE CHEST 1 VIEW COMPARISON:  01/23/2021 FINDINGS: Interval placement of right jugular central venous catheter with the tip in the lower SVC. No pneumothorax. Mild elevation right hemidiaphragm unchanged. Minimal right lower lobe atelectasis. Left lung clear. Negative for heart failure edema or effusion. IMPRESSION: Elevated right hemidiaphragm with mild right lower lobe atelectasis Central venous catheter tip in the SVC without pneumothorax. Electronically Signed   By: Franchot Gallo M.D.   On: 02/20/2021 20:36        Scheduled Meds:  sodium chloride   Intravenous Once   carvedilol  6.25 mg Oral BID WC   Chlorhexidine Gluconate Cloth  6 each Topical Q0600   diltiazem  360 mg Oral Daily   diltiazem  10 mg Intravenous Once   insulin aspart  0-5 Units Subcutaneous QHS   insulin aspart  0-6 Units Subcutaneous TID WC   sodium chloride flush  10 mL Intracatheter Q12H   Continuous Infusions:  sodium chloride       LOS: 1 day       Phillips Climes, MD [facility]   To contact the attending provider between 7A-7P or the covering provider during after hours 7P-7A, please log into the web site [email or web address] and access using universal [place] password for that web site. If you do not have the  password, please call the hospital operator.  02/22/2021, 1:19 PM

## 2021-02-22 NOTE — Progress Notes (Signed)
IR notified of patient's hospital admission 02/20/21 to [facility] for frequent falls, weakness and anemia. Patient has percutaneous cholecystostomy placed by IR 01/25/21. Routine outpatient drain exchange is already scheduled for 03/14/21.   Inpatient drain care orders placed - Flush drain once per shift with 5-10 ml NS. Change the dressing daily or as needed. Document drain output once per shift. Please cal lR if any difficulties flushing drain or if drain output is minimal and patient develops RUQ pain, fevers, etc.    Soyla Dryer, AGACNP-BC [phone] 02/22/2021, 8:54 AM

## 2021-02-23 DIAGNOSIS — D62 Acute posthemorrhagic anemia: Secondary | ICD-10-CM

## 2021-02-23 DIAGNOSIS — Z992 Dependence on renal dialysis: Secondary | ICD-10-CM | POA: Diagnosis not present

## 2021-02-23 DIAGNOSIS — K922 Gastrointestinal hemorrhage, unspecified: Secondary | ICD-10-CM | POA: Diagnosis not present

## 2021-02-23 DIAGNOSIS — E43 Unspecified severe protein-calorie malnutrition: Secondary | ICD-10-CM | POA: Insufficient documentation

## 2021-02-23 DIAGNOSIS — N186 End stage renal disease: Secondary | ICD-10-CM | POA: Diagnosis not present

## 2021-02-23 LAB — CBC
HCT: 25 % — ABNORMAL LOW (ref 39.0–52.0)
HCT: 24.6 % — ABNORMAL LOW (ref 39.0–52.0)
Hemoglobin: 7.9 g/dL — ABNORMAL LOW (ref 13.0–17.0)
Hemoglobin: 8 g/dL — ABNORMAL LOW (ref 13.0–17.0)
MCH: 27.9 pg (ref 26.0–34.0)
MCH: 28.9 pg (ref 26.0–34.0)
MCHC: 31.6 g/dL (ref 30.0–36.0)
MCHC: 32.5 g/dL (ref 30.0–36.0)
MCV: 88.3 fL (ref 80.0–100.0)
MCV: 88.8 fL (ref 80.0–100.0)
Platelets: 167 10*3/uL (ref 150–400)
Platelets: 177 10*3/uL (ref 150–400)
RBC: 2.83 MIL/uL — ABNORMAL LOW (ref 4.22–5.81)
RBC: 2.77 MIL/uL — ABNORMAL LOW (ref 4.22–5.81)
RDW: 14 % (ref 11.5–15.5)
RDW: 14.1 % (ref 11.5–15.5)
WBC: 4.5 10*3/uL (ref 4.0–10.5)
WBC: 4.2 10*3/uL (ref 4.0–10.5)
nRBC: 0 % (ref 0.0–0.2)
nRBC: 0 % (ref 0.0–0.2)

## 2021-02-23 LAB — BASIC METABOLIC PANEL
Anion gap: 9 (ref 5–15)
BUN: 19 mg/dL (ref 8–23)
CO2: 26 mmol/L (ref 22–32)
Calcium: 7.9 mg/dL — ABNORMAL LOW (ref 8.9–10.3)
Chloride: 99 mmol/L (ref 98–111)
Creatinine, Ser: 5.14 mg/dL — ABNORMAL HIGH (ref 0.61–1.24)
GFR, Estimated: 11 mL/min — ABNORMAL LOW (ref >60)
Glucose, Bld: 106 mg/dL — ABNORMAL HIGH (ref 70–99)
Potassium: 3.4 mmol/L — ABNORMAL LOW (ref 3.5–5.1)
Sodium: 134 mmol/L — ABNORMAL LOW (ref 135–145)

## 2021-02-23 LAB — GLUCOSE, CAPILLARY
Glucose-Capillary: 94 mg/dL (ref 70–99)
Glucose-Capillary: 101 mg/dL — ABNORMAL HIGH (ref 70–99)
Glucose-Capillary: 121 mg/dL — ABNORMAL HIGH (ref 70–99)
Glucose-Capillary: 120 mg/dL — ABNORMAL HIGH (ref 70–99)

## 2021-02-23 LAB — HEPATITIS B SURFACE ANTIBODY, QUANTITATIVE: Hep B S AB Quant (Post): <3.1 m[IU]/mL — ABNORMAL LOW (ref >9.9)

## 2021-02-23 MED ORDER — PEG 3350-KCL-NA BICARB-NACL 420 G PO SOLR
4000.0000 mL | Freq: Once | ORAL | Status: AC
  Administered 2021-02-23: 4000 mL via ORAL
  Filled 2021-02-23: qty 4000

## 2021-02-23 NOTE — Plan of Care (Signed)
Per Physical Therapy chat: "Worked with pt this AM; grossly limited with mobility due to dizziness with positional changes. Unable to get BP/orthostatics. Per RN, pt has been refusing bed alarm and has been ambulating to/from bathroom without assist. Pt states that he takes a few steps and starts to feel dizzy; recommended using BSC at EOB but pt declining. Educated regarding importance of safety with mobility with dizziness; pt verbalized understanding but still declining recommendation for use of BSC. Will recommend DC home with HHPT and WC. Will continue to work with pt while hospitalized."

## 2021-02-23 NOTE — TOC Progression Note (Signed)
Transition of Care [facility]) - Progression Note    Patient Details  Name: Gregory Dixon MRN: [id number] Date of Birth: 1940-08-25  Transition of [facility]) CM/SW Contact  Gregory Dixon Gregory Pod, RN Phone Number:[phone] 02/23/2021, 12:57 PM  Clinical Narrative:    Recommendation for HHPT/OT and DME for WC & RW. Spoke with pt and spouse in the room who are receptive to any available agency in the area. [facility] Gregory Dixon) will accept HHPT/OT and [facility] will provide DME to be delivered to the pt's home as requested by the wife Gregory Dixon). Team and pt aware of the above arrangements pending discharge over the next few days.  Pt reports support system with live in spouse Gregory Dixon) and able to afford all discharge medications. Pt lives in a single family home with no steps mentioned. Pt has sufficient transportation upon his discharge from the hospital and to all medication appointments with no delays.  TOC team will continue to be available to assistance with any ongoing discharge needs.   Expected Discharge Plan: [place] Barriers to Discharge: Barriers Resolved  Expected Discharge Plan and Services Expected Discharge Plan: [place] Choice: Durable Medical Equipment, Home Health Living arrangements for the past 2 months: Single Family Home                 DME Arranged: Walker rolling, Wheelchair manual DME Agency: [facility] Date DME Agency Contacted: 02/23/21 Time DME Agency Contacted: 0712 Representative spoke with at DME Agency: Gregory Dixon HH Arranged: PT, OT [place] Agency: Gregory Dixon ([place]) Date [place]: 02/23/21 Time [place]: 1257 Representative spoke with at [place]: Gregory Dixon (Gregory Dixon) Interventions    Readmission Risk Interventions Readmission Risk Prevention Plan 02/22/2021  Transportation Screening Complete  PCP or Specialist Appt within 5-7 Days Complete   Home Care Screening (No Data)  Medication Review (RN CM) Complete  Some recent data might be hidden

## 2021-02-23 NOTE — Progress Notes (Signed)
Patient still repeating that he's drinking it when it's obvious that he is not. Patient irritable and refusing to drink anymore. Nurse tech attempted to encourage patient to drink his prep also to no avail.

## 2021-02-23 NOTE — Progress Notes (Signed)
[name], MD [facility]   [address] [place], [place] [address] Phone: [phone] Fax : [phone]   Subjective: The patient is laying comfortably in bed without any complaints.  He drank some of his prep but is continuing to have solid bowel movements.  The patient wants to know if he can put the prep on ice which she states would help him drink it more easily.  The patient denies any abdominal pain nausea vomiting fevers chills black stools or bloody stools.   Objective: Vital signs in last 24 hours: Vitals:   02/22/21 2045 02/22/21 2229 02/23/21 0525 02/23/21 0837  BP: (!) 99/52 (!) 106/59 (!) 108/59 103/64  Pulse:  87 76 87  Resp: 17 20 16 16   Temp:  98.2 F (36.8 C) 97.8 F (36.6 C) (!) 97.5 F (36.4 C)  TempSrc:  Oral Oral Oral  SpO2:  99% 98% 98%  Weight:      Height:       Weight change:   Intake/Output Summary (Last 24 hours) at 02/23/2021 1358 Last data filed at 02/23/2021 0900 Gross per 24 hour  Intake 5 ml  Output 1276 ml  Net -1271 ml     Exam: General: sitting in bed in no apparent distress alert and orientated 3   Lab Results: @LABTEST2 @ Micro Results: Recent Results (from the past 240 hour(s))  Resp Panel by RT-PCR (Flu A&B, Covid) Nasopharyngeal Swab     Status: Abnormal   Collection Time: 02/20/21  8:22 PM   Specimen: Nasopharyngeal Swab; Nasopharyngeal(NP) swabs in vial transport medium  Result Value Ref Range Status   SARS Coronavirus 2 by RT PCR POSITIVE (A) NEGATIVE Final    Comment: RESULT CALLED TO, READ BACK BY AND VERIFIED WITH: WHITNEY HICKS @2203  ON 02/20/21 (NOTE) SARS-CoV-2 target nucleic acids are DETECTED.  The SARS-CoV-2 RNA is generally detectable in upper respiratory specimens during the acute phase of infection. Positive results are indicative of the presence of the identified virus, but do not rule out bacterial infection or co-infection with other pathogens not detected by the test. Clinical correlation  with patient history and other diagnostic information is necessary to determine patient infection status. The expected result is Negative.  Fact Sheet for Patients: EntrepreneurPulse.com.au  Fact Sheet for Healthcare Providers: IncredibleEmployment.be  This test is not yet approved or cleared by the [place] FDA and  has been authorized for detection and/or diagnosis of SARS-CoV-2 by FDA under an Emergency Use Authorization (EUA).  This EUA will remain in effect (meaning this test can be u sed) for the duration of  the COVID-19 declaration under Section 564(b)(1) of the Act, 21 U.S.C. section 360bbb-3(b)(1), unless the authorization is terminated or revoked sooner.     Influenza A by PCR NEGATIVE NEGATIVE Final   Influenza B by PCR NEGATIVE NEGATIVE Final    Comment: (NOTE) The Xpert Xpress SARS-CoV-2/FLU/RSV plus assay is intended as an aid in the diagnosis of influenza from Nasopharyngeal swab specimens and should not be used as a sole basis for treatment. Nasal washings and aspirates are unacceptable for Xpert Xpress SARS-CoV-2/FLU/RSV testing.  Fact Sheet for Patients: EntrepreneurPulse.com.au  Fact Sheet for Healthcare Providers: IncredibleEmployment.be  This test is not yet approved or cleared by the [place] FDA and has been authorized for detection and/or diagnosis of SARS-CoV-2 by FDA under an Emergency Use Authorization (EUA). This EUA will remain in effect (meaning this test can be used) for the duration of the COVID-19 declaration under Section 564(b)(1)  of the Act, 21 U.S.C. section 360bbb-3(b)(1), unless the authorization is terminated or revoked.  Performed at [facility], [address] [place], [place] [address]    Studies/Results: No results found. Medications: I have reviewed the patient's current medications. Scheduled Meds:  (feeding supplement) PROSource  Plus  30 mL Oral TID BM   sodium chloride   Intravenous Once   vitamin C  500 mg Oral BID   carvedilol  6.25 mg Oral BID WC   Chlorhexidine Gluconate Cloth  6 each Topical Q0600   diltiazem  360 mg Oral Daily   diltiazem  10 mg Intravenous Once   feeding supplement  1 Container Oral TID BM   insulin aspart  0-5 Units Subcutaneous QHS   insulin aspart  0-6 Units Subcutaneous TID WC   multivitamin  1 tablet Oral QHS   multivitamin with minerals  1 tablet Oral Daily   sodium chloride flush  10 mL Intracatheter Q12H   Continuous Infusions:  sodium chloride     PRN Meds:.acetaminophen **OR** acetaminophen, ondansetron **OR** ondansetron (ZOFRAN) IV   Assessment: Principal Problem:   Acute blood loss anemia Active Problems:   Acute lower GI bleeding   Essential hypertension   Benign prostatic hyperplasia   Hyperlipidemia   Anemia associated with chronic renal failure   Acute anemia   MGUS (monoclonal gammopathy of unknown significance)   Weakness   Symptomatic anemia   Protein-calorie malnutrition, severe    Plan: The patient has agreed to undergo an EGD and colonoscopy tomorrow.  The patient will be given a prep and has been explained the plan.  He states he will try to drink the prep until his stools are clear.   LOS: 2 days   Lewayne Bunting 02/23/2021, 1:58 PM Pager [phone] 7am-5pm  Check AMION for 5pm -7am coverage and on weekends

## 2021-02-23 NOTE — Progress Notes (Signed)
[facility]  ROUNDING NOTE   Subjective:   Hemodialysis treatment yesterday. Tolerated treatment well. UF of 1 liter  Objective:  Vital signs in last 24 hours:  Temp:  [97.5 F (36.4 C)-98.2 F (36.8 C)] 97.5 F (36.4 C) (11/19 0837) Pulse Rate:  [76-87] 87 (11/19 0837) Resp:  [10-22] 16 (11/19 0837) BP: (99-127)/(51-64) 103/64 (11/19 0837) SpO2:  [98 %-99 %] 98 % (11/19 0837)  Weight change:  Filed Weights   02/20/21 1949 02/21/21 2104  Weight: 94.3 kg 93.4 kg    Intake/Output: I/O last 3 completed shifts: In: 5 [Other:5] Out: 5465 [Drains:300; Other:1001]   Intake/Output this shift:  Total I/O In: -  Out: 225 [Drains:225]  Physical Exam: General: NAD, moving around the room  Head: Normocephalic, atraumatic. Moist oral mucosal membranes  Eyes: Anicteric, PERRL  Neck: Supple, trachea midline  Lungs:  Clear to auscultation  Heart: Irregular, tachycardia  Abdomen:  Soft, nontender, RUQ drain   Extremities:  no peripheral edema.  Neurologic: Nonfocal, moving all four extremities  Skin: No lesions  Access: RIJ permcath    Basic Metabolic Panel: Recent Labs  Lab 02/20/21 2022 02/21/21 0830 02/22/21 0435 02/23/21 0625  NA 136 137 137 134*  K 3.7 4.0 3.5 3.4*  CL 102 101 104 99  CO2 24 26 24 26   GLUCOSE 154* 99 87 106*  BUN 14 17 25* 19  CREATININE 4.49* 5.54* 6.99* 5.14*  CALCIUM 7.8* 8.2* 8.0* 7.9*     Liver Function Tests: Recent Labs  Lab 02/20/21 2022  AST 22  ALT 10  ALKPHOS 36*  BILITOT 0.6  PROT 6.3*  ALBUMIN 2.7*    No results for input(s): LIPASE, AMYLASE in the last 168 hours. No results for input(s): AMMONIA in the last 168 hours.  CBC: Recent Labs  Lab 02/21/21 0830 02/21/21 1658 02/22/21 0435 02/22/21 1748 02/23/21 0625  WBC 4.9 5.1 5.1 1.9* 4.5  NEUTROABS  --   --   --  1.0*  --   HGB 7.6* 7.4* 7.9* 7.5* 7.9*  HCT 24.1* 23.4* 25.3* 23.7* 25.0*  MCV 89.6 88.6 88.2 87.8 88.3  PLT 174 175 169 171 167      Cardiac Enzymes: No results for input(s): CKTOTAL, CKMB, CKMBINDEX, TROPONINI in the last 168 hours.  BNP: Invalid input(s): POCBNP  CBG: Recent Labs  Lab 02/21/21 2106 02/22/21 0808 02/22/21 1211 02/22/21 2230 02/23/21 0840  GLUCAP 158* 106* 106* 92 94     Microbiology: Results for orders placed or performed during the hospital encounter of 02/20/21  Resp Panel by RT-PCR (Flu A&B, Covid) Nasopharyngeal Swab     Status: Abnormal   Collection Time: 02/20/21  8:22 PM   Specimen: Nasopharyngeal Swab; Nasopharyngeal(NP) swabs in vial transport medium  Result Value Ref Range Status   SARS Coronavirus 2 by RT PCR POSITIVE (A) NEGATIVE Final    Comment: RESULT CALLED TO, READ BACK BY AND VERIFIED WITH: WHITNEY HICKS @2203  ON 02/20/21 (NOTE) SARS-CoV-2 target nucleic acids are DETECTED.  The SARS-CoV-2 RNA is generally detectable in upper respiratory specimens during the acute phase of infection. Positive results are indicative of the presence of the identified virus, but do not rule out bacterial infection or co-infection with other pathogens not detected by the test. Clinical correlation with patient history and other diagnostic information is necessary to determine patient infection status. The expected result is Negative.  Fact Sheet for Patients: EntrepreneurPulse.com.au  Fact Sheet for Healthcare Providers: IncredibleEmployment.be  This test is not  yet approved or cleared by the [place] and  has been authorized for detection and/or diagnosis of SARS-CoV-2 by FDA under an Emergency Use Authorization (EUA).  This EUA will remain in effect (meaning this test can be u sed) for the duration of  the COVID-19 declaration under Section 564(b)(1) of the Act, 21 U.S.C. section 360bbb-3(b)(1), unless the authorization is terminated or revoked sooner.     Influenza A by PCR NEGATIVE NEGATIVE Final   Influenza B by PCR  NEGATIVE NEGATIVE Final    Comment: (NOTE) The Xpert Xpress SARS-CoV-2/FLU/RSV plus assay is intended as an aid in the diagnosis of influenza from Nasopharyngeal swab specimens and should not be used as a sole basis for treatment. Nasal washings and aspirates are unacceptable for Xpert Xpress SARS-CoV-2/FLU/RSV testing.  Fact Sheet for Patients: EntrepreneurPulse.com.au  Fact Sheet for Healthcare Providers: IncredibleEmployment.be  This test is not yet approved or cleared by the [place] FDA and has been authorized for detection and/or diagnosis of SARS-CoV-2 by FDA under an Emergency Use Authorization (EUA). This EUA will remain in effect (meaning this test can be used) for the duration of the COVID-19 declaration under Section 564(b)(1) of the Act, 21 U.S.C. section 360bbb-3(b)(1), unless the authorization is terminated or revoked.  Performed at [facility], [address] [place], [place] [address]     Coagulation Studies: No results for input(s): LABPROT, INR in the last 72 hours.  Urinalysis: No results for input(s): COLORURINE, LABSPEC, PHURINE, GLUCOSEU, HGBUR, BILIRUBINUR, KETONESUR, PROTEINUR, UROBILINOGEN, NITRITE, LEUKOCYTESUR in the last 72 hours.  Invalid input(s): APPERANCEUR    Imaging: No results found.   Medications:    sodium chloride      (feeding supplement) PROSource Plus  30 mL Oral TID BM   sodium chloride   Intravenous Once   vitamin C  500 mg Oral BID   carvedilol  6.25 mg Oral BID WC   Chlorhexidine Gluconate Cloth  6 each Topical Q0600   diltiazem  360 mg Oral Daily   diltiazem  10 mg Intravenous Once   feeding supplement  1 Container Oral TID BM   insulin aspart  0-5 Units Subcutaneous QHS   insulin aspart  0-6 Units Subcutaneous TID WC   multivitamin  1 tablet Oral QHS   multivitamin with minerals  1 tablet Oral Daily   sodium chloride flush  10 mL Intracatheter Q12H    acetaminophen **OR** acetaminophen, ondansetron **OR** ondansetron (ZOFRAN) IV  Assessment/ Plan:  [name] is a 80 y.o. white male with end stage renal disease on hemodialysis, hypertension, hyperlipidemia, atrial fibrillation, diabetes mellitus type II who is admitted to [facility] on 02/20/2021 for Rectal bleeding [K62.5] ESRD on dialysis ([place]) [N18.6, Z99.2] Symptomatic anemia [D64.9] Acute anemia [D64.9]  [facility] RIJ permcath 95.5kg  End Stage Renal Disease: MWF schedule. Next dialysis for Monday.   Hypertension with chronic kidney disease: with Atrial fibrillation with RVR. Concern for syncopal episode.  - Continue diltiazem and carvedilol.  - Appreciate cardiology input  Anemia with chronic kidney disease: hemoglobin 7.9. Receiving mircera 60 units with outpatient dialysis. Now with lower GI bleed. Outpatient labs consistent with iron deficiency.  - Appreciate GI input. Endoscopy scheduled for today  Secondary Hyperparathyroidism: outpatient labs from 11/14 with Phos 5.1, calcium 8, PTH elevated at 720. Not currently on binders.    LOS: 2 Mcdonald Reiling 11/19/202211:41 AM

## 2021-02-23 NOTE — Progress Notes (Signed)
Educated and informed patient about need to drink his bowel prep. Patient kept repeating he's been drinking it (he only just recently got back to the floor from HD). Will continue to encourage patient to drink his prep.

## 2021-02-23 NOTE — Evaluation (Signed)
Occupational Therapy Evaluation Patient Details Name: Gregory Dixon MRN: [id number] DOB: 1940/09/13 Today's Date: 02/23/2021   History of Present Illness 80 year old admitted to [facility] 02/20/21 due to falls, generalized weakness; Work-up significant for anemia with hemoglobin of 7.2, he does endorse bright red blood per rectum, he was admitted for further work-up. PMH includes MGUS, iron deficiency anemia, end-stage renal disease on hemodialysis via right upper anterior PermCath (MWF), cholecystitis status post IR percutaneous cholecystostomy placement, insulin-dependent diabetes mellitus, hypertension, GERD, atrial fibrillation, on anticoagulation   Clinical Impression   Pt was seen for OT evaluation this date. Prior to hospital admission, pt was MOD I-I in all  ADL/IADL. Pt lives with his wife in a house, STE. Currently pt demonstrates impairments as described below (See OT problem list) which functionally limit his ability to perform ADL/self-care tasks. Pt currently requires CGA, frequent vcs for all ADL due to reported unsteadiness. VSS throughout. Pt would benefit from skilled OT services to address noted impairments and functional limitations (see below for any additional details) in order to maximize safety and independence while minimizing falls risk and caregiver burden. Upon hospital discharge, recommend HHOT to maximize pt safety and return to functional independence during meaningful occupations of daily life. Pt educated on importance of bed alarm, use of BSC (vs ambulating to the bathroom). Pt agrees to use BSC, continues to decline use of bed alarm. RN notified. OT will continue to follow.     Recommendations for follow up therapy are one component of a multi-disciplinary discharge planning process, led by the attending physician.  Recommendations may be updated based on patient status, additional functional criteria and insurance authorization.   Follow Up Recommendations  Home health  OT    Assistance Recommended at Discharge Frequent or constant Supervision/Assistance  Functional Status Assessment  Patient has had a recent decline in their functional status and demonstrates the ability to make significant improvements in function in a reasonable and predictable amount of time.  Equipment Recommendations  BSC/3in1    Recommendations for Other Services       Precautions / Restrictions Precautions Precautions: Fall Restrictions Weight Bearing Restrictions: No      Mobility Bed Mobility Overal bed mobility: Modified Independent             General bed mobility comments: to sit EOB with HOB slightly elevated and use of BUE for support    Transfers Overall transfer level: Needs assistance Equipment used: Rolling walker (2 wheels) Transfers: Sit to/from Stand;Bed to chair/wheelchair/BSC Sit to Stand: Min guard Stand pivot transfers: Min guard         General transfer comment: pt unsteady due to reported dizziness      Balance Overall balance assessment: Needs assistance Sitting-balance support: Bilateral upper extremity supported;Feet supported Sitting balance-Leahy Scale: Good     Standing balance support: Bilateral upper extremity supported;During functional activity Standing balance-Leahy Scale: Poor Standing balance comment: reaching outside BOS for steadying; limited secondary to dizziness                           ADL either performed or assessed with clinical judgement   ADL Overall ADL's : Needs assistance/impaired Eating/Feeding: Modified independent   Grooming: Oral care;Min guard;Wash/dry face Grooming Details (indicate cue type and reason): due to dizziness             Lower Body Dressing: Supervision/safety   Toilet Transfer: Min Dispensing optician Details (indicate cue type and reason):  simulated Toileting- Clothing Manipulation and Hygiene: Minimal assistance Toileting - Clothing Manipulation  Details (indicate cue type and reason): simulated     Functional mobility during ADLs: Min guard General ADL Comments: mobility limited- pt reporting dizziness; vss throughoutu     Vision         Perception     Praxis      Pertinent Vitals/Pain Pain Assessment: No/denies pain     Hand Dominance     Extremity/Trunk Assessment Upper Extremity Assessment Upper Extremity Assessment: Overall WFL for tasks assessed   Lower Extremity Assessment Lower Extremity Assessment: Overall WFL for tasks assessed       Communication Communication Communication: No difficulties   Cognition Arousal/Alertness: Awake/alert Behavior During Therapy: WFL for tasks assessed/performed Overall Cognitive Status: Within Functional Limits for tasks assessed                                 General Comments: AXOx4, poor insight into deficits for safety with     General Comments  continues to decline use of bed alarm; agreeable to toileting on bsc next to bed with education;    Exercises Other Exercises Other Exercises: educated re: role of OT/recommendations, safety Other Exercises: Pt educated re: PT role/POC, DC recommendations, safety with mobility/with onset of dizziness, bed/chair alarm, use of RW for energy conservation/safety. He verbalized understanding but verbalized not wanting to use bed alarm/BSC for toileting.   Shoulder Instructions      Home Living Family/patient expects to be discharged to:: Private residence Living Arrangements: Spouse/significant other Available Help at Discharge: Family;Available 24 hours/day Type of Home: House Home Access: Stairs to enter [employer] of Steps: 1 Entrance Stairs-Rails: None Home Layout: One level     Bathroom Shower/Tub: Occupational psychologist: Handicapped height Bathroom Accessibility: Yes   Home Equipment: Wheelchair - manual;Electric scooter;Grab bars - tub/shower;Shower seat - built in;Cane -  single point;Cane - quad   Additional Comments: mwc borrowed from friend      Prior Functioning/Environment Prior Level of Function : Independent/Modified Independent             Mobility Comments: Pt and wife report MOD I-I typically with mobility however PTA, over previous 2 weeks pt using mwc due to weakness and dizziness; ADLs Comments: Independent with ADLs, can drive; Wife does cooking/cleaning/laundry.        OT Problem List: Decreased strength;Impaired balance (sitting and/or standing);Decreased knowledge of precautions;Decreased activity tolerance;Decreased knowledge of use of DME or AE      OT Treatment/Interventions: Self-care/ADL training;Therapeutic exercise;DME and/or AE instruction;Therapeutic activities;Patient/family education    OT Goals(Current goals can be found in the care plan section) Acute Rehab OT Goals Patient Stated Goal: to feel better OT Goal Formulation: With patient Time For Goal Achievement: 03/08/21 Potential to Achieve Goals: Good ADL Goals Pt Will Perform Grooming: with modified independence;standing Pt Will Perform Toileting - Clothing Manipulation and hygiene: with modified independence;sitting/lateral leans Pt Will Perform Tub/Shower Transfer: with modified independence;shower seat Additional ADL Goal #1: pt will improve ADL completion by performing all ADL tasks with no vcs required for sequencing or safety  OT Frequency: Min 2X/week   Barriers to D/C:            Co-evaluation              AM-PAC OT "6 Clicks" Daily Activity     Outcome Measure Help from another person eating meals?:  None Help from another person taking care of personal grooming?: None Help from another person toileting, which includes using toliet, bedpan, or urinal?: A Little Help from another person bathing (including washing, rinsing, drying)?: A Little Help from another person to put on and taking off regular upper body clothing?: None Help from  another person to put on and taking off regular lower body clothing?: A Little 6 Click Score: 21   End of Session Equipment Utilized During Treatment: Rolling walker (2 wheels) Nurse Communication: Mobility status  Activity Tolerance: Patient tolerated treatment well;Treatment limited secondary to medical complications (Comment) Patient left: in bed;with call bell/phone within reach;with family/visitor present  OT Visit Diagnosis: Unsteadiness on feet (R26.81);Repeated falls (R29.6)                Time: 5465-6812 OT Time Calculation (min): 17 min Charges:  OT General Charges $OT Visit: 1 Visit OT Evaluation $OT Eval Moderate Complexity: 1 Mod OT Treatments $Self Care/Home Management : 8-22 mins Shanon Payor, OTD OTR/L  02/23/21, 1:41 PM

## 2021-02-23 NOTE — Evaluation (Addendum)
Physical Therapy Evaluation Patient Details Name: Gregory Dixon MRN: [id number] DOB: 03-27-1941 Today's Date: 02/23/2021  History of Present Illness  Pt admitted to [facility] on 02/20/21 for syncopal event resulting in mechanical fall. Pt also endorses weakness and fatigue. Found to be COVID+. Work-up significant for anemia with Hgb of 7.2 and endorses bright red blood per rectum. Significant PMH includes: ESRD on HD (MWF), HTN, HLD, Afib, T2DM, iron deficiency anemia, cholecystitis s/p IR percutaneous cholecystostomy placement, GERD.   Clinical Impression  Pt is a 80 year old M admitted to hospital on 02/20/21 for acute blood loss anemia and COVID. Over the past month pt has noted decline in overall independence with mobility and has just been transferring to/from [facility] for mobility; prior that, pt was using RW vs. No AD for mobility. Otherwise, pt states he was Ind with ADL's and that spouse assisted with IADL's and medication management. Pt presents with decreased upright tolerance/dizziness with positional changes, decreased standing balance, decreased activity tolerance, poor safety awareness, and poor insight into deficits resulting in impaired functional mobility from baseline. Due to deficits, pt required mod I for bed mobility and CGA for safety with transfers; further mobility limited secondary to quick onset of dizziness with positional changes. Unable to obtain orthostatics/BP reading in standing, with lowest BP during session reading at 102/54 mmHg; dizziness alleviated with return to supine. Pt educated extensively regarding safety concerns due to onset of dizziness with positional changes including: risk of falls, using RW, using BSC next to bed instead of ambulating to bathroom, and bed alarm. Pt verbalized understanding, but continues to decline use of bed alarm and use of BSC next to bed. Therefore, pt educated to take his time and hold mobility if dizzy, and to use RW for improved safety/energy  conservation with mobility if pt does ambulate to bathroom. He verbalized understanding. Deficits limit the pt's ability to safely and independently perform ADL's, transfer, and ambulate. Pt will benefit from acute skilled PT services to address deficits for return to baseline function. At this time, PT recommends HHPT with frequent/constant supervision if dizziness does not subside. He will also benefit from RW for safe household mobility, and manual WC for improved safety and mobility with community navigation at DC. Pt seems agreeable for DC recommendations.        Recommendations for follow up therapy are one component of a multi-disciplinary discharge planning process, led by the attending physician.  Recommendations may be updated based on patient status, additional functional criteria and insurance authorization.  Follow Up Recommendations Home health PT    Assistance Recommended at Discharge Frequent or constant Supervision/Assistance  Functional Status Assessment Patient has had a recent decline in their functional status and demonstrates the ability to make significant improvements in function in a reasonable and predictable amount of time.  Equipment Recommendations  Wheelchair (measurements PT);Rolling walker (2 wheels)    Recommendations for Other Services       Precautions / Restrictions Precautions Precautions: Fall Restrictions Weight Bearing Restrictions: No      Mobility  Bed Mobility Overal bed mobility: Modified Independent             General bed mobility comments: to sit EOB with HOB slightly elevated and use of BUE for support    Transfers Overall transfer level: Needs assistance Equipment used: None Transfers: Sit to/from Stand Sit to Stand: Min guard           General transfer comment: for safety to stand/sit from EOB without AD;  upon immediate standing pt reaching out to counter for steadying. Limited time spent in standing due to onset of  dizziness    Ambulation/Gait               General Gait Details: not assessed due to dizziness     Balance Overall balance assessment: Needs assistance Sitting-balance support: Bilateral upper extremity supported;Feet supported Sitting balance-Leahy Scale: Good     Standing balance support: Bilateral upper extremity supported;During functional activity Standing balance-Leahy Scale: Poor Standing balance comment: reaching outside BOS for steadying; limited secondary to dizziness                             Pertinent Vitals/Pain Pain Assessment: No/denies pain    Home Living Family/patient expects to be discharged to:: Private residence Living Arrangements: Spouse/significant other Available Help at Discharge: Family;Available 24 hours/day (spouse able to provide 24/7 supervision; daughter works and able to provide PRN assist) Type of Home: House Home Access: Stairs to enter Entrance Stairs-Rails: None Entrance Stairs-Number of Steps: 1   Home Layout: One level Home Equipment: Wheelchair - manual;Electric scooter;Grab bars - tub/shower;Shower seat - built in;Cane - single point;Cane - quad Additional Comments: borrowing [place] from friend    Prior Function Prior Level of Function : Independent/Modified Independent             Mobility Comments: Pt states that over the last 2 weeks he's been using a manual WC due to progress BLE weakness. Before that, he was using a RW for "a little while". Before that, he said he wasn't using an AD for mobility. States that he has been experiencing intermittent dizziness with upright mobility, but that it has improved since getting blood at the hospital. ADLs Comments: Pt reports being Ind with ADL's and transportation. Spouse to assist with IADL's and medication management.        Extremity/Trunk Assessment   Upper Extremity Assessment Upper Extremity Assessment: Overall WFL for tasks assessed (mild N/T in hands due to  neuropathy)    Lower Extremity Assessment Lower Extremity Assessment: Overall WFL for tasks assessed (impaired sensation from knees down due to neuropathy)          Cognition Arousal/Alertness: Awake/alert Behavior During Therapy: WFL for tasks assessed/performed Overall Cognitive Status: Within Functional Limits for tasks assessed                                 General Comments: A&O x4; able to follow 100% of simple 2-step commands. Poor safety awareness and insight into deficits; declining use of bed alarm and recommendation for safety with toileting (using BSC vs. ambulating to bathroom).        General Comments General comments (skin integrity, edema, etc.): drain attached to RLE; currently declining use of bed alarm and PT recommendations for safety with toileting secondary to dizziness (declining use of BSC next to bed instead of ambulating to bathroom, despite reports of getting dizzy after taking 3-4 steps)    Exercises Other Exercises Other Exercises: Participated in bed mobility and transfers; further mobility limited secondary to decreased upright tolerance and dizziness. Dizziness subsided in supine. Other Exercises: Pt educated re: PT role/POC, DC recommendations, safety with mobility/with onset of dizziness, bed/chair alarm, use of RW for energy conservation/safety. He verbalized understanding but verbalized not wanting to use bed alarm/BSC for toileting.   Assessment/Plan    PT Assessment Patient  needs continued PT services  PT Problem List Decreased activity tolerance;Decreased balance;Decreased mobility;Decreased safety awareness;Impaired sensation (dizziness)       PT Treatment Interventions Gait training;Stair training;Functional mobility training;Therapeutic activities;Therapeutic exercise;Balance training;Neuromuscular re-education    PT Goals (Current goals can be found in the Care Plan section)  Acute Rehab PT Goals Patient Stated Goal: "go  home" PT Goal Formulation: With patient Time For Goal Achievement: 03/09/21 Potential to Achieve Goals: Good    Frequency Min 2X/week   Barriers to discharge Decreased caregiver support daughter can provide PRN assist; spouse can provide 24/7 supervision       AM-PAC PT "6 Clicks" Mobility  Outcome Measure Help needed turning from your back to your side while in a flat bed without using bedrails?: None Help needed moving from lying on your back to sitting on the side of a flat bed without using bedrails?: A Little Help needed moving to and from a bed to a chair (including a wheelchair)?: A Little Help needed standing up from a chair using your arms (e.g., wheelchair or bedside chair)?: A Little Help needed to walk in hospital room?: A Little Help needed climbing 3-5 steps with a railing? : A Lot 6 Click Score: 18    End of Session Equipment Utilized During Treatment: Gait belt Activity Tolerance:  (limited secondary to dizziness) Patient left: in bed;with call bell/phone within reach (declining bed alarm; RN notified) Nurse Communication: Mobility status PT Visit Diagnosis: Unsteadiness on feet (R26.81);Difficulty in walking, not elsewhere classified (R26.2);Dizziness and giddiness (R42)    Time: 7681-1572 PT Time Calculation (min) (ACUTE ONLY): 16 min   Charges:   PT Evaluation $PT Eval Low Complexity: 1 Low          Herminio Commons, PT, DPT 12:20 PM,02/23/21

## 2021-02-23 NOTE — Progress Notes (Signed)
Patient asleep and still did not and will not drink anymore of his prep.

## 2021-02-23 NOTE — Progress Notes (Signed)
Patient completed over half of his bowel prep, unable to complete all. Patient's most recent BM is clear with no stool, and pink colored noted. Placed patient NPO, per orders.

## 2021-02-23 NOTE — Anesthesia Preprocedure Evaluation (Addendum)
Anesthesia Evaluation  Patient identified by MRN, date of birth, ID band Patient awake    Reviewed: Allergy & Precautions, NPO status , Patient's Chart, lab work & pertinent test results  Airway Mallampati: II      Comment: Dentures  Dental  (+) Lower Dentures, Upper Dentures   Pulmonary former smoker,  COVID+ 02/13/21   Pulmonary exam normal        Cardiovascular hypertension, + dysrhythmias (a fib on Eliquis, currently held)  Rhythm:Irregular Rate:Abnormal  ECG 02/21/21:  Atrial fibrillation with rapid ventricular response with premature ventricular or aberrantly conducted complexes Septal infarct , age undetermined   Neuro/Psych    GI/Hepatic BRBPR   Endo/Other  diabetes, Type 2, Insulin Dependent  Renal/GU ESRF and DialysisRenal disease (last HD 02/22/21)   BPH    Musculoskeletal   Abdominal   Peds  Hematology  (+) Blood dyscrasia, anemia ,   Anesthesia Other Findings   Reproductive/Obstetrics                           Anesthesia Physical Anesthesia Plan  ASA: 4 and emergent  Anesthesia Plan: General   Post-op Pain Management:    Induction: Intravenous  PONV Risk Score and Plan: 2 and Propofol infusion, TIVA and Treatment may vary due to age or medical condition  Airway Management Planned: Natural Airway  Additional Equipment:   Intra-op Plan:   Post-operative Plan:   Informed Consent: I have reviewed the patients History and Physical, chart, labs and discussed the procedure including the risks, benefits and alternatives for the proposed anesthesia with the patient or authorized representative who has indicated his/her understanding and acceptance.   Patient has DNR.  Discussed DNR with patient.   Dental advisory given  Plan Discussed with: CRNA  Anesthesia Plan Comments:      Anesthesia Quick Evaluation

## 2021-02-23 NOTE — Progress Notes (Signed)
Patient refused bed alarm.    

## 2021-02-23 NOTE — Progress Notes (Signed)
PROGRESS NOTE    Gregory Dixon  WUJ:[id number] DOB: [birth year] DOA: 02/20/2021 PCP: Valera Castle, MD   Chief Complaint  Patient presents with   Fall    Brief Narrative:    Gregory Dixon is a 80 y.o. male with medical history significant for MGUS, iron deficiency anemia, end-stage renal disease on hemodialysis via right upper anterior PermCath, cholecystitis status post IR percutaneous cholecystostomy placement, insulin-dependent diabetes mellitus, hypertension, GERD, atrial fibrillation, on anticoagulation, who presents emergency department for chief concerns of frequent falling, weakness, his work-up significant for anemia with hemoglobin of 7.2, he does endorse bright red blood per rectum, he was admitted for further work-up.   Assessment & Plan:   Principal Problem:   Acute blood loss anemia Active Problems:   Acute lower GI bleeding   Essential hypertension   Benign prostatic hyperplasia   Hyperlipidemia   Anemia associated with chronic renal failure   Acute anemia   MGUS (monoclonal gammopathy of unknown significance)   Weakness   Symptomatic anemia   Protein-calorie malnutrition, severe    Symptomatic anemia/acute blood loss anemia-presumed secondary to lower GI bleed -Hemoglobin at 7.2, so far he is transfused 2 units PRBC, hemoglobin is 7.9 this morning, which is lower than expected increase, which still indicates he still having bleed, will continue to monitor closely and transfuse further if needed . -GI input greatly appreciated, plan for endoscopy, will hold his Eliquis, allow 2 to 3 days to washout as discussed with GI. -Patient would not finish his bowel prep yesterday, still having solid stool, continue with clear liquid diet today, n.p.o. after midnight, hopefully plan for EGD/colonoscopy tomorrow.   -Continue to hold Eliquis   Insulin-dependent diabetes mellitus - insulin SSI with at bedtime coverage ordered - Hemoglobin A1c was 5.6 on 01/25/2021    End-stage renal disease on hemodialysis via permacath - Renal consulted,  to continue Monday Wednesday Friday schedule.     Atrial fibrillation with RVR -Heart rate is better controlled currently now he is back on his home dose -Continue to hold Eliquis due to GI bleed .   Hypertension -Cardizem and Coreg were resumed for heart rate control.Marland Kitchen   COVID 19 infection - Initially positive on 11/9, he is positive again today, asymptomatic, no hypoxia, no pneumonia on imaging, no indication for treatment, will DC quarantine after 10 days on 11/20.  History of cholecystitis -Status post percutaneous cholecystostomy drain, discussed with general surgery, no plan for surgery this admission. -IR input greatly appreciated, continue with drain flushing/care.  DVT prophylaxis: Eliquis on hold for GI bleed. Code Status: DNR Family Communication: None at bedside Disposition:   Status is: Inpatient  Remains inpatient appropriate because:work-up for GI bleed       Consultants:  Renal GI Gen surgery  Subjective:  He denies any nausea, vomiting or abdominal pain, patient still reports blood with bowel movements.  Objective: Vitals:   02/22/21 2045 02/22/21 2229 02/23/21 0525 02/23/21 0837  BP: (!) 99/52 (!) 106/59 (!) 108/59 103/64  Pulse:  87 76 87  Resp: 17 20 16 16   Temp:  98.2 F (36.8 C) 97.8 F (36.6 C) (!) 97.5 F (36.4 C)  TempSrc:  Oral Oral Oral  SpO2:  99% 98% 98%  Weight:      Height:        Intake/Output Summary (Last 24 hours) at 02/23/2021 1437 Last data filed at 02/23/2021 0900 Gross per 24 hour  Intake 5 ml  Output 1276 ml  Net -1271 ml  Filed Weights   02/20/21 1949 02/21/21 2104  Weight: 94.3 kg 93.4 kg    Examination:  Awake Alert, Oriented X 3, No new F.N deficits, Normal affect Symmetrical Chest wall movement, Good air movement bilaterally, CTAB RRR,No Gallops,Rubs or new Murmurs, No Parasternal Heave +ve B.Sounds, Abd Soft, No tenderness,  No rebound - guarding or rigidity. No Cyanosis, Clubbing or edema, No new Rash or bruise   Patient did drink some of his prep yesterday, but still having solid stools.    Data Reviewed: I have personally reviewed following labs and imaging studies  CBC: Recent Labs  Lab 02/21/21 0830 02/21/21 1658 02/22/21 0435 02/22/21 1748 02/23/21 0625  WBC 4.9 5.1 5.1 1.9* 4.5  NEUTROABS  --   --   --  1.0*  --   HGB 7.6* 7.4* 7.9* 7.5* 7.9*  HCT 24.1* 23.4* 25.3* 23.7* 25.0*  MCV 89.6 88.6 88.2 87.8 88.3  PLT 174 175 169 171 096    Basic Metabolic Panel: Recent Labs  Lab 02/20/21 2022 02/21/21 0830 02/22/21 0435 02/23/21 0625  NA 136 137 137 134*  K 3.7 4.0 3.5 3.4*  CL 102 101 104 99  CO2 24 26 24 26   GLUCOSE 154* 99 87 106*  BUN 14 17 25* 19  CREATININE 4.49* 5.54* 6.99* 5.14*  CALCIUM 7.8* 8.2* 8.0* 7.9*    GFR: Estimated Creatinine Clearance: 15.1 mL/min (A) (by C-G formula based on SCr of 5.14 mg/dL (H)).  Liver Function Tests: Recent Labs  Lab 02/20/21 2022  AST 22  ALT 10  ALKPHOS 36*  BILITOT 0.6  PROT 6.3*  ALBUMIN 2.7*    CBG: Recent Labs  Lab 02/22/21 0808 02/22/21 1211 02/22/21 2230 02/23/21 0840 02/23/21 1214  GLUCAP 106* 106* 92 94 101*     Recent Results (from the past 240 hour(s))  Resp Panel by RT-PCR (Flu A&B, Covid) Nasopharyngeal Swab     Status: Abnormal   Collection Time: 02/20/21  8:22 PM   Specimen: Nasopharyngeal Swab; Nasopharyngeal(NP) swabs in vial transport medium  Result Value Ref Range Status   SARS Coronavirus 2 by RT PCR POSITIVE (A) NEGATIVE Final    Comment: RESULT CALLED TO, READ BACK BY AND VERIFIED WITH: WHITNEY HICKS @2203  ON 02/20/21 (NOTE) SARS-CoV-2 target nucleic acids are DETECTED.  The SARS-CoV-2 RNA is generally detectable in upper respiratory specimens during the acute phase of infection. Positive results are indicative of the presence of the identified virus, but do not rule out bacterial infection  or co-infection with other pathogens not detected by the test. Clinical correlation with patient history and other diagnostic information is necessary to determine patient infection status. The expected result is Negative.  Fact Sheet for Patients: EntrepreneurPulse.com.au  Fact Sheet for Healthcare Providers: IncredibleEmployment.be  This test is not yet approved or cleared by the [place] FDA and  has been authorized for detection and/or diagnosis of SARS-CoV-2 by FDA under an Emergency Use Authorization (EUA).  This EUA will remain in effect (meaning this test can be u sed) for the duration of  the COVID-19 declaration under Section 564(b)(1) of the Act, 21 U.S.C. section 360bbb-3(b)(1), unless the authorization is terminated or revoked sooner.     Influenza A by PCR NEGATIVE NEGATIVE Final   Influenza B by PCR NEGATIVE NEGATIVE Final    Comment: (NOTE) The Xpert Xpress SARS-CoV-2/FLU/RSV plus assay is intended as an aid in the diagnosis of influenza from Nasopharyngeal swab specimens and should not be used as a sole basis  for treatment. Nasal washings and aspirates are unacceptable for Xpert Xpress SARS-CoV-2/FLU/RSV testing.  Fact Sheet for Patients: EntrepreneurPulse.com.au  Fact Sheet for Healthcare Providers: IncredibleEmployment.be  This test is not yet approved or cleared by the [place] FDA and has been authorized for detection and/or diagnosis of SARS-CoV-2 by FDA under an Emergency Use Authorization (EUA). This EUA will remain in effect (meaning this test can be used) for the duration of the COVID-19 declaration under Section 564(b)(1) of the Act, 21 U.S.C. section 360bbb-3(b)(1), unless the authorization is terminated or revoked.  Performed at [facility], [address] [place], [place] [address]          Radiology Studies: No results  found.      Scheduled Meds:  (feeding supplement) PROSource Plus  30 mL Oral TID BM   sodium chloride   Intravenous Once   vitamin C  500 mg Oral BID   carvedilol  6.25 mg Oral BID WC   Chlorhexidine Gluconate Cloth  6 each Topical Q0600   diltiazem  360 mg Oral Daily   diltiazem  10 mg Intravenous Once   feeding supplement  1 Container Oral TID BM   insulin aspart  0-5 Units Subcutaneous QHS   insulin aspart  0-6 Units Subcutaneous TID WC   multivitamin  1 tablet Oral QHS   multivitamin with minerals  1 tablet Oral Daily   polyethylene glycol-electrolytes  4,000 mL Oral Once   sodium chloride flush  10 mL Intracatheter Q12H   Continuous Infusions:  sodium chloride       LOS: 2 days       Phillips Climes, MD [facility]   To contact the attending provider between 7A-7P or the covering provider during after hours 7P-7A, please log into the web site [email or web address] and access using universal [place] password for that web site. If you do not have the password, please call the hospital operator.  02/23/2021, 2:37 PM

## 2021-02-24 ENCOUNTER — Inpatient Hospital Stay: Payer: Medicare Other | Admitting: Anesthesiology

## 2021-02-24 ENCOUNTER — Encounter
Admission: EM | Disposition: A | Discharge status: Home Health | Payer: Self-pay | Source: Home / Self Care | Attending: Internal Medicine

## 2021-02-24 DIAGNOSIS — K635 Polyp of colon: Secondary | ICD-10-CM

## 2021-02-24 DIAGNOSIS — D62 Acute posthemorrhagic anemia: Secondary | ICD-10-CM | POA: Diagnosis not present

## 2021-02-24 DIAGNOSIS — N186 End stage renal disease: Secondary | ICD-10-CM | POA: Diagnosis not present

## 2021-02-24 DIAGNOSIS — Z992 Dependence on renal dialysis: Secondary | ICD-10-CM | POA: Diagnosis not present

## 2021-02-24 DIAGNOSIS — K922 Gastrointestinal hemorrhage, unspecified: Secondary | ICD-10-CM | POA: Diagnosis not present

## 2021-02-24 DIAGNOSIS — K921 Melena: Principal | ICD-10-CM

## 2021-02-24 HISTORY — PX: COLONOSCOPY: SHX5424

## 2021-02-24 HISTORY — PX: ESOPHAGOGASTRODUODENOSCOPY: SHX5428

## 2021-02-24 LAB — CBC
HCT: 23.5 % — ABNORMAL LOW (ref 39.0–52.0)
Hemoglobin: 7.4 g/dL — ABNORMAL LOW (ref 13.0–17.0)
MCH: 27.8 pg (ref 26.0–34.0)
MCHC: 31.5 g/dL (ref 30.0–36.0)
MCV: 88.3 fL (ref 80.0–100.0)
Platelets: 156 10*3/uL (ref 150–400)
RBC: 2.66 MIL/uL — ABNORMAL LOW (ref 4.22–5.81)
RDW: 14.1 % (ref 11.5–15.5)
WBC: 3.7 10*3/uL — ABNORMAL LOW (ref 4.0–10.5)
nRBC: 0 % (ref 0.0–0.2)

## 2021-02-24 LAB — GLUCOSE, CAPILLARY
Glucose-Capillary: 115 mg/dL — ABNORMAL HIGH (ref 70–99)
Glucose-Capillary: 114 mg/dL — ABNORMAL HIGH (ref 70–99)
Glucose-Capillary: 115 mg/dL — ABNORMAL HIGH (ref 70–99)
Glucose-Capillary: 144 mg/dL — ABNORMAL HIGH (ref 70–99)

## 2021-02-24 LAB — PREPARE RBC (CROSSMATCH)

## 2021-02-24 SURGERY — EGD (ESOPHAGOGASTRODUODENOSCOPY)
Anesthesia: General

## 2021-02-24 SURGERY — COLONOSCOPY WITH PROPOFOL
Anesthesia: General

## 2021-02-24 MED ORDER — SODIUM CHLORIDE 0.9% IV SOLUTION: Freq: Once | INTRAVENOUS | Status: AC

## 2021-02-24 MED ORDER — PROPOFOL 500 MG/50ML IV EMUL
INTRAVENOUS | Status: DC | PRN
  Administered 2021-02-24: 150 ug/kg/min via INTRAVENOUS

## 2021-02-24 MED ORDER — PROPOFOL 10 MG/ML IV BOLUS
INTRAVENOUS | Status: DC | PRN
  Administered 2021-02-24: 60 mg via INTRAVENOUS
  Administered 2021-02-24: 40 mg via INTRAVENOUS

## 2021-02-24 MED ORDER — ACETAMINOPHEN 325 MG PO TABS: 650.0000 mg | ORAL_TABLET | Freq: Once | ORAL | Status: DC | PRN

## 2021-02-24 MED ORDER — SODIUM CHLORIDE 0.9 % IV SOLN: INTRAVENOUS | Status: DC

## 2021-02-24 MED ORDER — ACETAMINOPHEN 160 MG/5ML PO SOLN
325.0000 mg | ORAL | Status: DC | PRN
  Filled 2021-02-24: qty 20.3

## 2021-02-24 MED ORDER — ONDANSETRON HCL 4 MG/2ML IJ SOLN: 4.0000 mg | Freq: Once | INTRAMUSCULAR | Status: DC | PRN

## 2021-02-24 MED ORDER — LIDOCAINE 2% (20 MG/ML) 5 ML SYRINGE
INTRAMUSCULAR | Status: DC | PRN
  Administered 2021-02-24: 100 mg via INTRAVENOUS

## 2021-02-24 MED ORDER — PHENYLEPHRINE HCL (PRESSORS) 10 MG/ML IV SOLN
INTRAVENOUS | Status: DC | PRN
  Administered 2021-02-24: 160 ug via INTRAVENOUS
  Administered 2021-02-24: 240 ug via INTRAVENOUS
  Administered 2021-02-24 (×3): 160 ug via INTRAVENOUS
  Administered 2021-02-24: 240 ug via INTRAVENOUS
  Administered 2021-02-24: 160 ug via INTRAVENOUS

## 2021-02-24 NOTE — TOC Transition Note (Signed)
Transition of Care [facility]) - CM/SW Discharge Note   Patient Details  Name: Gregory Dixon MRN: [id number] Date of Birth: Dec 26, 1940  [facility] CM/SW Contact:  Harriet Masson, RN Phone Number:[phone] 02/24/2021, 3:10 PM   Clinical Narrative:    Verified with attending on pt's discharge tomorrow. [facility]) aware and will follow up with the pt/wife accordingly for the initial home visit.  TOC remains available to assist for further d/c needs.   Final next level of care: [place] Barriers to Discharge: Barriers Resolved   Patient Goals and CMS Choice        Discharge Placement                  Name of family member notified: [name] (wife) Patient and family notified of of transfer: 02/23/21  Discharge Plan and Services     Post Acute Care Choice: Durable Medical Equipment, Home Health          DME Arranged: Walker rolling, Wheelchair manual DME Agency: [employer] Date DME Agency Contacted: 02/23/21 Time DME Agency Contacted: [phone] Representative spoke with at DME Agency: Judson Roch HH Arranged: PT, OT [place] Agency: [place] ([place]) Date [place]: 02/23/21 Time [place]: 1257 Representative spoke with at [place]: [place] ([place]) Interventions     Readmission Risk Interventions Readmission Risk Prevention Plan 02/22/2021  Transportation Screening Complete  PCP or Specialist Appt within 5-7 Days Complete  Home Care Screening (No Data)  Medication Review (RN CM) Complete  Some recent data might be hidden

## 2021-02-24 NOTE — Anesthesia Postprocedure Evaluation (Signed)
Anesthesia Post Note  Patient: Gregory Dixon  Procedure(s) Performed: ESOPHAGOGASTRODUODENOSCOPY (EGD) COLONOSCOPY  Patient location during evaluation: PACU Anesthesia Type: General Level of consciousness: awake and alert Pain management: pain level controlled Vital Signs Assessment: post-procedure vital signs reviewed and stable Respiratory status: spontaneous breathing, nonlabored ventilation, respiratory function stable and patient connected to nasal cannula oxygen Cardiovascular status: stable and blood pressure returned to baseline Postop Assessment: no apparent nausea or vomiting Anesthetic complications: no   No notable events documented.   Last Vitals:  Vitals:   02/24/21 1852 02/24/21 2026  BP:  132/61  Pulse: 70 80  Resp: 18 16  Temp:  37 C  SpO2: 100% 97%    Last Pain:  Vitals:   02/24/21 2026  TempSrc: Oral  PainSc:                  [name]

## 2021-02-24 NOTE — Op Note (Signed)
[facility] Gastroenterology Patient Name: Gregory Dixon Procedure Date: 02/24/2021 12:02 PM MRN: [id number] Account #: [id number] Date of Birth: Aug 26, 1940 Admit Type: Inpatient Age: 80 Room: [facility] ENDO ROOM 1 Gender: Male Note Status: Finalized Instrument Name: Upper Endoscope [id number] Procedure:             Upper GI endoscopy Indications:           Iron deficiency anemia secondary to chronic blood loss Providers:             [name] MD, MD Medicines:             Propofol per Anesthesia Complications:         No immediate complications. Procedure:             Pre-Anesthesia Assessment:                        - Prior to the procedure, a History and Physical was                         performed, and patient medications and allergies were                         reviewed. The patient's tolerance of previous                         anesthesia was also reviewed. The risks and benefits                         of the procedure and the sedation options and risks                         were discussed with the patient. All questions were                         answered, and informed consent was obtained. Prior                         Anticoagulants: The patient has taken no previous                         anticoagulant or antiplatelet agents. ASA Grade                         Assessment: II - A patient with mild systemic disease.                         After reviewing the risks and benefits, the patient                         was deemed in satisfactory condition to undergo the                         procedure.                        After obtaining informed consent, the endoscope was                         passed under direct  vision. Throughout the procedure,                         the patient's blood pressure, pulse, and oxygen                         saturations were monitored continuously. The Endoscope                         was introduced through the  mouth, and advanced to the                         second part of duodenum. The upper GI endoscopy was                         accomplished without difficulty. The patient tolerated                         the procedure well. Findings:      A small hiatal hernia was present.      The stomach was normal.      The examined duodenum was normal. Impression:            - Small hiatal hernia.                        - Normal stomach.                        - Normal examined duodenum.                        - No specimens collected. Recommendation:        - Resume previous diet.                        - Continue present medications.                        - Perform a colonoscopy today. Procedure Code(s):     --- Professional ---                        [phone], Esophagogastroduodenoscopy, flexible,                         transoral; diagnostic, including collection of                         specimen(s) by brushing or washing, when performed                         (separate procedure) Diagnosis Code(s):     --- Professional ---                        D50.0, Iron deficiency anemia secondary to blood loss                         (chronic) CPT copyright 2019 American Medical Association. All rights reserved. The codes documented in this report are preliminary and upon coder review may  be revised to meet current compliance requirements. Suman Trivedi  Jumana Paccione MD, MD 02/24/2021 12:49:07 PM This report has been signed electronically. Number of Addenda: 0 Note Initiated On: 02/24/2021 12:02 PM Estimated Blood Loss:  Estimated blood loss: none.      [facility]

## 2021-02-24 NOTE — Progress Notes (Signed)
[facility]  ROUNDING NOTE   Subjective:   Seen prior to patient's endoscopic procedure. EGD with no lesions. Colonoscopy with several polyps removed, lipoma found and internal hemorrhoids.  Objective:  Vital signs in last 24 hours:  Temp:  [96.6 F (35.9 C)-98.2 F (36.8 C)] 97.3 F (36.3 C) (11/20 1403) Pulse Rate:  [70-92] 75 (11/20 1403) Resp:  [14-23] 18 (11/20 1403) BP: (85-117)/(47-77) 102/54 (11/20 1403) SpO2:  [97 %-100 %] 98 % (11/20 1403)  Weight change:  Filed Weights   02/20/21 1949 02/21/21 2104  Weight: 94.3 kg 93.4 kg    Intake/Output: I/O last 3 completed shifts: In: 5 [Other:5] Out: 7989 [Drains:375; Other:1001]   Intake/Output this shift:  Total I/O In: -  Out: 150 [Drains:150]  Physical Exam: General: NAD, laying in bed  Head: Normocephalic, atraumatic. Moist oral mucosal membranes  Eyes: Anicteric, PERRL  Neck: Supple, trachea midline  Lungs:  Clear to auscultation  Heart: Irregular, tachycardia  Abdomen:  Soft, nontender, RUQ drain   Extremities:  no peripheral edema.  Neurologic: Nonfocal, moving all four extremities  Skin: No lesions  Access: RIJ permcath    Basic Metabolic Panel: Recent Labs  Lab 02/20/21 2022 02/21/21 0830 02/22/21 0435 02/23/21 0625  NA 136 137 137 134*  K 3.7 4.0 3.5 3.4*  CL 102 101 104 99  CO2 24 26 24 26   GLUCOSE 154* 99 87 106*  BUN 14 17 25* 19  CREATININE 4.49* 5.54* 6.99* 5.14*  CALCIUM 7.8* 8.2* 8.0* 7.9*     Liver Function Tests: Recent Labs  Lab 02/20/21 2022  AST 22  ALT 10  ALKPHOS 36*  BILITOT 0.6  PROT 6.3*  ALBUMIN 2.7*    No results for input(s): LIPASE, AMYLASE in the last 168 hours. No results for input(s): AMMONIA in the last 168 hours.  CBC: Recent Labs  Lab 02/22/21 0435 02/22/21 1748 02/23/21 0625 02/23/21 1546 02/24/21 0645  WBC 5.1 1.9* 4.5 4.2 3.7*  NEUTROABS  --  1.0*  --   --   --   HGB 7.9* 7.5* 7.9* 8.0* 7.4*  HCT 25.3* 23.7* 25.0* 24.6*  23.5*  MCV 88.2 87.8 88.3 88.8 88.3  PLT 169 171 167 177 156     Cardiac Enzymes: No results for input(s): CKTOTAL, CKMB, CKMBINDEX, TROPONINI in the last 168 hours.  BNP: Invalid input(s): POCBNP  CBG: Recent Labs  Lab 02/23/21 1214 02/23/21 1726 02/23/21 2103 02/24/21 0841 02/24/21 1141  GLUCAP 101* 121* 120* 115* 114*     Microbiology: Results for orders placed or performed during the hospital encounter of 02/20/21  Resp Panel by RT-PCR (Flu A&B, Covid) Nasopharyngeal Swab     Status: Abnormal   Collection Time: 02/20/21  8:22 PM   Specimen: Nasopharyngeal Swab; Nasopharyngeal(NP) swabs in vial transport medium  Result Value Ref Range Status   SARS Coronavirus 2 by RT PCR POSITIVE (A) NEGATIVE Final    Comment: RESULT CALLED TO, READ BACK BY AND VERIFIED WITH: WHITNEY HICKS @2203  ON 02/20/21 (NOTE) SARS-CoV-2 target nucleic acids are DETECTED.  The SARS-CoV-2 RNA is generally detectable in upper respiratory specimens during the acute phase of infection. Positive results are indicative of the presence of the identified virus, but do not rule out bacterial infection or co-infection with other pathogens not detected by the test. Clinical correlation with patient history and other diagnostic information is necessary to determine patient infection status. The expected result is Negative.  Fact Sheet for Patients: EntrepreneurPulse.com.au  Fact Sheet  for Healthcare Providers: IncredibleEmployment.be  This test is not yet approved or cleared by the [place] and  has been authorized for detection and/or diagnosis of SARS-CoV-2 by FDA under an Emergency Use Authorization (EUA).  This EUA will remain in effect (meaning this test can be u sed) for the duration of  the COVID-19 declaration under Section 564(b)(1) of the Act, 21 U.S.C. section 360bbb-3(b)(1), unless the authorization is terminated or revoked sooner.      Influenza A by PCR NEGATIVE NEGATIVE Final   Influenza B by PCR NEGATIVE NEGATIVE Final    Comment: (NOTE) The Xpert Xpress SARS-CoV-2/FLU/RSV plus assay is intended as an aid in the diagnosis of influenza from Nasopharyngeal swab specimens and should not be used as a sole basis for treatment. Nasal washings and aspirates are unacceptable for Xpert Xpress SARS-CoV-2/FLU/RSV testing.  Fact Sheet for Patients: EntrepreneurPulse.com.au  Fact Sheet for Healthcare Providers: IncredibleEmployment.be  This test is not yet approved or cleared by the [place] FDA and has been authorized for detection and/or diagnosis of SARS-CoV-2 by FDA under an Emergency Use Authorization (EUA). This EUA will remain in effect (meaning this test can be used) for the duration of the COVID-19 declaration under Section 564(b)(1) of the Act, 21 U.S.C. section 360bbb-3(b)(1), unless the authorization is terminated or revoked.  Performed at [facility], [address] [place], [place] [address]     Coagulation Studies: No results for input(s): LABPROT, INR in the last 72 hours.  Urinalysis: No results for input(s): COLORURINE, LABSPEC, PHURINE, GLUCOSEU, HGBUR, BILIRUBINUR, KETONESUR, PROTEINUR, UROBILINOGEN, NITRITE, LEUKOCYTESUR in the last 72 hours.  Invalid input(s): APPERANCEUR    Imaging: No results found.   Medications:    sodium chloride      (feeding supplement) PROSource Plus  30 mL Oral TID BM   sodium chloride   Intravenous Once   vitamin C  500 mg Oral BID   carvedilol  6.25 mg Oral BID WC   Chlorhexidine Gluconate Cloth  6 each Topical Q0600   diltiazem  360 mg Oral Daily   diltiazem  10 mg Intravenous Once   feeding supplement  1 Container Oral TID BM   insulin aspart  0-5 Units Subcutaneous QHS   insulin aspart  0-6 Units Subcutaneous TID WC   multivitamin  1 tablet Oral QHS   multivitamin with minerals  1 tablet Oral Daily    sodium chloride flush  10 mL Intracatheter Q12H   ondansetron **OR** ondansetron (ZOFRAN) IV  Assessment/ Plan:  [name] is a 80 y.o. white male with end stage renal disease on hemodialysis, hypertension, hyperlipidemia, atrial fibrillation, diabetes mellitus type II who is admitted to [facility] on 02/20/2021 for Rectal bleeding [K62.5] ESRD on dialysis (Interior) [N18.6, Z99.2] Symptomatic anemia [D64.9] Acute anemia [D64.9]  [facility] RIJ permcath 95.5kg  End Stage Renal Disease: MWF schedule. Next dialysis for Monday. Orders prepared.   Hypertension with chronic kidney disease: with Atrial fibrillation with RVR. Concern for syncopal episode.  - Continue diltiazem and carvedilol.  - Appreciate cardiology input  Anemia with chronic kidney disease: hemoglobin 7.4. Receiving mircera 60 units with outpatient dialysis. Now with lower GI bleed. Outpatient labs consistent with iron deficiency.  - Appreciate GI input.   Secondary Hyperparathyroidism: outpatient labs from 11/14 with Phos 5.1, calcium 8, PTH elevated at 720. Not currently on binders.    LOS: 3 Gregory Dixon 11/20/20222:08 PM

## 2021-02-24 NOTE — Op Note (Signed)
[facility] Gastroenterology Patient Name: Gregory Dixon Procedure Date: 02/24/2021 12:01 PM MRN: [id number] Account #: [id number] Date of Birth: 13-Apr-1940 Admit Type: Inpatient Age: 80 Room: [facility] ENDO ROOM 1 Gender: Male Note Status: Finalized Instrument Name: Jasper Riling [id number] Procedure:             Colonoscopy Indications:           Hematochezia Providers:             [name] MD, MD Medicines:             Propofol per Anesthesia Procedure:             Pre-Anesthesia Assessment:                        - Prior to the procedure, a History and Physical was                         performed, and patient medications and allergies were                         reviewed. The patient's tolerance of previous                         anesthesia was also reviewed. The risks and benefits                         of the procedure and the sedation options and risks                         were discussed with the patient. All questions were                         answered, and informed consent was obtained. Prior                         Anticoagulants: The patient has taken no previous                         anticoagulant or antiplatelet agents. ASA Grade                         Assessment: II - A patient with mild systemic disease.                         After reviewing the risks and benefits, the patient                         was deemed in satisfactory condition to undergo the                         procedure.                        After obtaining informed consent, the colonoscope was                         passed under direct vision. Throughout the procedure,  the patient's blood pressure, pulse, and oxygen                         saturations were monitored continuously. The                         Colonoscope was introduced through the anus and                         advanced to the the cecum, identified by appendiceal                          orifice and ileocecal valve. The colonoscopy was                         performed without difficulty. The patient tolerated                         the procedure well. The quality of the bowel                         preparation was excellent. Findings:      The perianal and digital rectal examinations were normal.      Two sessile polyps were found in the transverse colon. The polyps were 3       to 4 mm in size. These polyps were removed with a cold snare. Resection       and retrieval were complete.      There was a large lipoma, in the transverse colon.      A 4 mm polyp was found in the sigmoid colon. The polyp was sessile. The       polyp was removed with a cold snare. Resection and retrieval were       complete.      Non-bleeding internal hemorrhoids were found during retroflexion. The       hemorrhoids were Grade II (internal hemorrhoids that prolapse but reduce       spontaneously). Impression:            - Two 3 to 4 mm polyps in the transverse colon,                         removed with a cold snare. Resected and retrieved.                        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                        - One 4 mm polyp in the sigmoid colon, removed with a                         cold snare. Resected and retrieved.                        - Non-bleeding internal hemorrhoids. Recommendation:        - Return patient to hospital ward for ongoing care.                        - Resume previous diet.                        -  Continue present medications.                        - Await pathology results. Procedure Code(s):     --- Professional ---                        [id number], Colonoscopy, flexible; with removal of                         tumor(s), polyp(s), or other lesion(s) by snare                         technique Diagnosis Code(s):     --- Professional ---                        K92.1, Melena (includes Hematochezia)                        K63.5, Polyp of  colon CPT copyright 2019 American Medical Association. All rights reserved. The codes documented in this report are preliminary and upon coder review may  be revised to meet current compliance requirements. [name] MD, MD 02/24/2021 1:12:55 PM This report has been signed electronically. Number of Addenda: 0 Note Initiated On: 02/24/2021 12:01 PM Scope Withdrawal Time: 0 hours 10 minutes 19 seconds  Total Procedure Duration: 0 hours 15 minutes 34 seconds  Estimated Blood Loss:  Estimated blood loss: none.      [facility]

## 2021-02-24 NOTE — Progress Notes (Signed)
PROGRESS NOTE    Gregory Dixon  BOF:[id number] DOB: 03/11/1941 DOA: 02/20/2021 PCP: Valera Castle, MD   Chief Complaint  Patient presents with   Fall    Brief Narrative:    Gregory Dixon is a 80 y.o. male with medical history significant for MGUS, iron deficiency anemia, end-stage renal disease on hemodialysis via right upper anterior PermCath, cholecystitis status post IR percutaneous cholecystostomy placement, insulin-dependent diabetes mellitus, hypertension, GERD, atrial fibrillation, on anticoagulation, who presents emergency department for chief concerns of frequent falling, weakness, his work-up significant for anemia with hemoglobin of 7.2, he does endorse bright red blood per rectum, he was admitted for further work-up.   Assessment & Plan:   Principal Problem:   Acute blood loss anemia Active Problems:   Acute lower GI bleeding   Essential hypertension   Benign prostatic hyperplasia   Hyperlipidemia   Anemia associated with chronic renal failure   Acute anemia   MGUS (monoclonal gammopathy of unknown significance)   Weakness   Symptomatic anemia   Protein-calorie malnutrition, severe    Symptomatic anemia/acute blood loss anemia-presumed secondary to lower GI bleed -Hemoglobin at 7.2, so far he is transfused 2 units PRBC, 1 hemoglobin remains on the lower side, it is 7.4 today, he still having evidence of active lower GI bleed. -We will repeat CBC at 3 PM, and transfuse if hemoglobin is trending down. . -GI input greatly appreciated, plan for endoscopy/colonoscopy today, continue to hold Eliquis.   Insulin-dependent diabetes mellitus - insulin SSI with at bedtime coverage ordered - Hemoglobin A1c was 5.6 on 01/25/2021   End-stage renal disease on hemodialysis via permacath - Renal consulted,  to continue Monday Wednesday Friday schedule.     Atrial fibrillation with RVR -Heart rate is better controlled currently now he is back on his home  dose -Continue to hold Eliquis due to GI bleed .   Hypertension -Cardizem and Coreg were resumed for heart rate control.Marland Kitchen   COVID 19 infection - Initially positive on 11/9, he remains positive on admission, patient finished quarantine 11/20.    History of cholecystitis -Status post percutaneous cholecystostomy drain, discussed with general surgery, no plan for surgery this admission. -IR input greatly appreciated, continue with drain flushing/care.  DVT prophylaxis: Eliquis on hold for GI bleed. Code Status: DNR Family Communication: None at bedside Disposition:   Status is: Inpatient  Remains inpatient appropriate because:work-up for GI bleed       Consultants:  Renal GI Gen surgery  Subjective:  No significant events overnight, he denies any complaints, currently bowel movement is clear, with no stools, pink in color.     Objective: Vitals:   02/24/21 0700 02/24/21 0843 02/24/21 0900 02/24/21 0925  BP:  103/77    Pulse:  78    Resp: 17 20 (!) 23 19  Temp:  98.1 F (36.7 C)    TempSrc:      SpO2:  100%    Weight:      Height:        Intake/Output Summary (Last 24 hours) at 02/24/2021 1053 Last data filed at 02/24/2021 0925 Gross per 24 hour  Intake --  Output 250 ml  Net -250 ml   Filed Weights   02/20/21 1949 02/21/21 2104  Weight: 94.3 kg 93.4 kg    Examination:  Awake Alert, Oriented X 3, No new F.N deficits, Normal affect Symmetrical Chest wall movement, Good air movement bilaterally, CTAB No Gallops,Rubs or new Murmurs, No Parasternal Heave +ve B.Sounds, Abd Soft,  No tenderness, No rebound - guarding or rigidity. No Cyanosis, Clubbing or edema, No new Rash or bruise       Data Reviewed: I have personally reviewed following labs and imaging studies  CBC: Recent Labs  Lab 02/22/21 0435 02/22/21 1748 02/23/21 0625 02/23/21 1546 02/24/21 0645  WBC 5.1 1.9* 4.5 4.2 3.7*  NEUTROABS  --  1.0*  --   --   --   HGB 7.9* 7.5* 7.9* 8.0*  7.4*  HCT 25.3* 23.7* 25.0* 24.6* 23.5*  MCV 88.2 87.8 88.3 88.8 88.3  PLT 169 171 167 177 170    Basic Metabolic Panel: Recent Labs  Lab 02/20/21 2022 02/21/21 0830 02/22/21 0435 02/23/21 0625  NA 136 137 137 134*  K 3.7 4.0 3.5 3.4*  CL 102 101 104 99  CO2 24 26 24 26   GLUCOSE 154* 99 87 106*  BUN 14 17 25* 19  CREATININE 4.49* 5.54* 6.99* 5.14*  CALCIUM 7.8* 8.2* 8.0* 7.9*    GFR: Estimated Creatinine Clearance: 15.1 mL/min (A) (by C-G formula based on SCr of 5.14 mg/dL (H)).  Liver Function Tests: Recent Labs  Lab 02/20/21 2022  AST 22  ALT 10  ALKPHOS 36*  BILITOT 0.6  PROT 6.3*  ALBUMIN 2.7*    CBG: Recent Labs  Lab 02/23/21 0840 02/23/21 1214 02/23/21 1726 02/23/21 2103 02/24/21 0841  GLUCAP 94 101* 121* 120* 115*     Recent Results (from the past 240 hour(s))  Resp Panel by RT-PCR (Flu A&B, Covid) Nasopharyngeal Swab     Status: Abnormal   Collection Time: 02/20/21  8:22 PM   Specimen: Nasopharyngeal Swab; Nasopharyngeal(NP) swabs in vial transport medium  Result Value Ref Range Status   SARS Coronavirus 2 by RT PCR POSITIVE (A) NEGATIVE Final    Comment: RESULT CALLED TO, READ BACK BY AND VERIFIED WITH: WHITNEY HICKS @2203  ON 02/20/21 (NOTE) SARS-CoV-2 target nucleic acids are DETECTED.  The SARS-CoV-2 RNA is generally detectable in upper respiratory specimens during the acute phase of infection. Positive results are indicative of the presence of the identified virus, but do not rule out bacterial infection or co-infection with other pathogens not detected by the test. Clinical correlation with patient history and other diagnostic information is necessary to determine patient infection status. The expected result is Negative.  Fact Sheet for Patients: EntrepreneurPulse.com.au  Fact Sheet for Healthcare Providers: IncredibleEmployment.be  This test is not yet approved or cleared by the [place]  FDA and  has been authorized for detection and/or diagnosis of SARS-CoV-2 by FDA under an Emergency Use Authorization (EUA).  This EUA will remain in effect (meaning this test can be u sed) for the duration of  the COVID-19 declaration under Section 564(b)(1) of the Act, 21 U.S.C. section 360bbb-3(b)(1), unless the authorization is terminated or revoked sooner.     Influenza A by PCR NEGATIVE NEGATIVE Final   Influenza B by PCR NEGATIVE NEGATIVE Final    Comment: (NOTE) The Xpert Xpress SARS-CoV-2/FLU/RSV plus assay is intended as an aid in the diagnosis of influenza from Nasopharyngeal swab specimens and should not be used as a sole basis for treatment. Nasal washings and aspirates are unacceptable for Xpert Xpress SARS-CoV-2/FLU/RSV testing.  Fact Sheet for Patients: EntrepreneurPulse.com.au  Fact Sheet for Healthcare Providers: IncredibleEmployment.be  This test is not yet approved or cleared by the [place] FDA and has been authorized for detection and/or diagnosis of SARS-CoV-2 by FDA under an Emergency Use Authorization (EUA). This EUA will remain in  effect (meaning this test can be used) for the duration of the COVID-19 declaration under Section 564(b)(1) of the Act, 21 U.S.C. section 360bbb-3(b)(1), unless the authorization is terminated or revoked.  Performed at [facility], [address], [place], [place] [address]          Radiology Studies: No results found.      Scheduled Meds:  (feeding supplement) PROSource Plus  30 mL Oral TID BM   sodium chloride   Intravenous Once   vitamin C  500 mg Oral BID   carvedilol  6.25 mg Oral BID WC   Chlorhexidine Gluconate Cloth  6 each Topical Q0600   diltiazem  360 mg Oral Daily   diltiazem  10 mg Intravenous Once   feeding supplement  1 Container Oral TID BM   insulin aspart  0-5 Units Subcutaneous QHS   insulin aspart  0-6 Units Subcutaneous TID WC    multivitamin  1 tablet Oral QHS   multivitamin with minerals  1 tablet Oral Daily   sodium chloride flush  10 mL Intracatheter Q12H   Continuous Infusions:  sodium chloride       LOS: 3 days       Phillips Climes, MD [facility]   To contact the attending provider between 7A-7P or the covering provider during after hours 7P-7A, please log into the web site [email or web address] and access using universal [place] password for that web site. If you do not have the password, please call the hospital operator.  02/24/2021, 10:53 AM

## 2021-02-24 NOTE — Transfer of Care (Signed)
Immediate Anesthesia Transfer of Care Note  Patient: Javonte Elenes  Procedure(s) Performed: ESOPHAGOGASTRODUODENOSCOPY (EGD) COLONOSCOPY  Patient Location: PACU  Anesthesia Type:General  Level of Consciousness: awake  Airway & Oxygen Therapy: Patient Spontanous Breathing and Patient connected to nasal cannula oxygen  Post-op Assessment: Report given to RN and Post -op Vital signs reviewed and stable  Post vital signs: Reviewed and stable  Last Vitals:  Vitals Value Taken Time  BP 90/52   Temp 96.56f   Pulse 87 02/24/21 1318  Resp 17 02/24/21 1318  SpO2 96 % 02/24/21 1318  Vitals shown include unvalidated device data.  Last Pain:  Vitals:   02/24/21 1233  TempSrc: Temporal  PainSc: 0-No pain         Complications: No notable events documented.

## 2021-02-25 ENCOUNTER — Encounter: Payer: Self-pay | Admitting: Gastroenterology

## 2021-02-25 DIAGNOSIS — Z992 Dependence on renal dialysis: Secondary | ICD-10-CM | POA: Diagnosis not present

## 2021-02-25 DIAGNOSIS — N186 End stage renal disease: Secondary | ICD-10-CM | POA: Diagnosis not present

## 2021-02-25 DIAGNOSIS — D62 Acute posthemorrhagic anemia: Secondary | ICD-10-CM | POA: Diagnosis not present

## 2021-02-25 DIAGNOSIS — K922 Gastrointestinal hemorrhage, unspecified: Secondary | ICD-10-CM | POA: Diagnosis not present

## 2021-02-25 LAB — BASIC METABOLIC PANEL WITH GFR
Anion gap: 7 (ref 5–15)
BUN: 40 mg/dL — ABNORMAL HIGH (ref 8–23)
CO2: 24 mmol/L (ref 22–32)
Calcium: 7.9 mg/dL — ABNORMAL LOW (ref 8.9–10.3)
Chloride: 103 mmol/L (ref 98–111)
Creatinine, Ser: 7.98 mg/dL — ABNORMAL HIGH (ref 0.61–1.24)
GFR, Estimated: 6 mL/min — ABNORMAL LOW
Glucose, Bld: 119 mg/dL — ABNORMAL HIGH (ref 70–99)
Potassium: 3.5 mmol/L (ref 3.5–5.1)
Sodium: 134 mmol/L — ABNORMAL LOW (ref 135–145)

## 2021-02-25 LAB — BPAM RBC
Blood Product Expiration Date: 202211252359
ISSUE DATE / TIME: 202211201732
Unit Type and Rh: 7300

## 2021-02-25 LAB — TYPE AND SCREEN
ABO/RH(D): AB POS
Antibody Screen: NEGATIVE
Unit division: 0

## 2021-02-25 LAB — CBC
HCT: 24.7 % — ABNORMAL LOW (ref 39.0–52.0)
Hemoglobin: 8 g/dL — ABNORMAL LOW (ref 13.0–17.0)
MCH: 28 pg (ref 26.0–34.0)
MCHC: 32.4 g/dL (ref 30.0–36.0)
MCV: 86.4 fL (ref 80.0–100.0)
Platelets: 153 10*3/uL (ref 150–400)
RBC: 2.86 MIL/uL — ABNORMAL LOW (ref 4.22–5.81)
RDW: 14.1 % (ref 11.5–15.5)
WBC: 4.1 10*3/uL (ref 4.0–10.5)
nRBC: 0 % (ref 0.0–0.2)

## 2021-02-25 LAB — GLUCOSE, CAPILLARY
Glucose-Capillary: 115 mg/dL — ABNORMAL HIGH (ref 70–99)
Glucose-Capillary: 168 mg/dL — ABNORMAL HIGH (ref 70–99)

## 2021-02-25 MED ORDER — HEPARIN SODIUM (PORCINE) 1000 UNIT/ML IJ SOLN
INTRAMUSCULAR | Status: AC
  Filled 2021-02-25: qty 1

## 2021-02-25 MED ORDER — APIXABAN 2.5 MG PO TABS
2.5000 mg | ORAL_TABLET | Freq: Two times a day (BID) | ORAL | 3 refills | Status: AC
Start: 2021-02-26 — End: ?

## 2021-02-25 NOTE — Progress Notes (Signed)
OT Cancellation Note  Patient Details Name: Nels Munn MRN: [id number] DOB: Jun 25, 1940   Cancelled Treatment:    Reason Eval/Treat Not Completed: Patient at procedure or test/ unavailable. Pt noted to be off the floor for procedure, unavailable at this time. Will continue to follow POC at later date/time as pt available.   Dessie Coma, M.S. OTR/L  02/25/21, 11:31 AM  ascom [phone]

## 2021-02-25 NOTE — TOC Transition Note (Addendum)
Transition of Care [facility]) - CM/SW Discharge Note   Patient Details  Name: Gregory Dixon MRN: [id number] Date of Birth: 1940/05/12  Transition of Care [facility]) CM/SW Contact:  Beverly Sessions, RN Phone Number: 02/25/2021, 1:23 PM   Clinical Narrative:     Patient to discharge home today Elvera Bicker dialysis liaison notified of discharge Ramond Marrow with [place] notified of discharge  Reached out to [place] with [employer] to confirm DME has been processed.  Awaiting response  Update confirmed order is being processed and notified Zack for discharge today   Final next level of care: [place] Barriers to Discharge: Barriers Resolved   Patient Goals and CMS Choice        Discharge Placement                  Discharge Plan and Services     Post Acute Care Choice: Durable Medical Equipment, Home Health          DME Arranged: Walker rolling, Wheelchair manual DME Agency: [employer] Date DME Agency Contacted: 02/23/21 Time DME Agency Contacted: [phone] Representative spoke with at DME Agency: [place]: PT, OT [facility] Agency: [place] ([place]) Date [place]: 02/23/21 Time [place]: 1257 Representative spoke with at [place]: [place] ([place]) Interventions     Readmission Risk Interventions Readmission Risk Prevention Plan 02/22/2021  Transportation Screening Complete  PCP or Specialist Appt within 5-7 Days Complete  Home Care Screening (No Data)  Medication Review (RN CM) Complete  Some recent data might be hidden

## 2021-02-25 NOTE — Discharge Summary (Signed)
Physician Discharge Summary  Gregory Dixon OIN:[id number] DOB: 12-14-1940 DOA: 02/20/2021  PCP: Gregory Castle, MD  Admit date: 02/20/2021 Discharge date: 02/25/2021  Admitted From: Home Disposition:  Home   Recommendations for Outpatient Follow-up:  Follow up with PCP in 1-2 weeks Please obtain BMP/CBC in one week   Home Health:YES Equipment/Devices:wheelchair, walker  Discharge Condition:Stable CODE STATUS:DNR Diet recommendation: renal  Brief/Interim Summary:   Gregory Dixon is a 80 y.o. male with medical history significant for MGUS, iron deficiency anemia, end-stage renal disease on hemodialysis via right upper anterior PermCath, cholecystitis status post IR percutaneous cholecystostomy placement, insulin-dependent diabetes mellitus, hypertension, GERD, atrial fibrillation, on anticoagulation, who presents emergency department for chief concerns of frequent falling, weakness, his work-up significant for anemia with hemoglobin of 7.2, he does endorse bright red blood per rectum, he was admitted for further work-up.     Symptomatic anemia/acute blood loss anemia-presumed secondary to lower GI bleed -Hemoglobin at 7.2 on admission, he did require total of 3 units PRBC transfusion during hospital stay, hemoglobin is 8 on discharge.  -Findings thought secondary due to lower GI bleed, has patient had few episodes of recurrent episodes of bright red blood per rectum, he was seen by GI, patient went for endoscopy/colonoscopy, it was done 11/20, there was no evidence of active GI bleed, was only significant for couple polyps which were resected, and grade 2 hemorrhoids. -Have discussed with GI, will need to hold Eliquis for 48 hours from his procedure due to polyp resection, can be resumed tomorrow morning  -Please monitor CBC closely as an outpatient.     Insulin-dependent diabetes mellitus - Hemoglobin A1c was 5.6 on 01/25/2021   End-stage renal disease on hemodialysis via  permacath - Renal consulted,  to continue Monday Wednesday Friday schedule.     Atrial fibrillation with RVR -Heart rate is controlled on current regimen of Cardizem and Coreg -Eliquis has been held during hospital stay due to lower GI bleed, it can be resumed tomorrow, please see discussion above.   Hypertension -Cardizem and Coreg were resumed for heart rate control..  Overall blood pressure is normal to lower range, so Imdur and dezocine were discontinued on discharge.     COVID 19 infection - Initially positive on 11/9, he remains positive on admission, patient finished quarantine 11/20.     History of cholecystitis -Status post percutaneous cholecystostomy drain, discussed with general surgery, no plan for surgery this admission. -IR input greatly appreciated, continue with drain flushing/care.    Discharge Diagnoses:  Principal Problem:   Acute blood loss anemia Active Problems:   Acute lower GI bleeding   Essential hypertension   Benign prostatic hyperplasia   Hyperlipidemia   Anemia associated with chronic renal failure   Acute anemia   MGUS (monoclonal gammopathy of unknown significance)   Weakness   Symptomatic anemia   Protein-calorie malnutrition, severe    Discharge Instructions  Discharge Instructions     Diet - low sodium heart healthy   Complete by: As directed    Discharge instructions   Complete by: As directed    Follow with Primary MD Gregory Castle, MD in 7 days   Get CBC, CMP,  checked  by Primary MD next visit.    Activity: As tolerated with Full fall precautions use walker/cane & assistance as needed   Disposition Home   Diet: Renal   On your next visit with your primary care physician please Get Medicines reviewed and adjusted.   Please request your Prim.MD  to go over all Hospital Tests and Procedure/Radiological results at the follow up, please get all Hospital records sent to your Prim MD by signing hospital release before  you go home.   If you experience worsening of your admission symptoms, develop shortness of breath, life threatening emergency, suicidal or homicidal thoughts you must seek medical attention immediately by calling 911 or calling your MD immediately  if symptoms less severe.  You Must read complete instructions/literature along with all the possible adverse reactions/side effects for all the Medicines you take and that have been prescribed to you. Take any new Medicines after you have completely understood and accpet all the possible adverse reactions/side effects.   Do not drive, operating heavy machinery, perform activities at heights, swimming or participation in water activities or provide baby sitting services if your were admitted for syncope or siezures until you have seen by Primary MD or a Neurologist and advised to do so again.  Do not drive when taking Pain medications.    Do not take more than prescribed Pain, Sleep and Anxiety Medications  Special Instructions: If you have smoked or chewed Tobacco  in the last 2 yrs please stop smoking, stop any regular Alcohol  and or any Recreational drug use.  Wear Seat belts while driving.   Please note  You were cared for by a hospitalist during your hospital stay. If you have any questions about your discharge medications or the care you received while you were in the hospital after you are discharged, you can call the unit and asked to speak with the hospitalist on call if the hospitalist that took care of you is not available. Once you are discharged, your primary care physician will handle any further medical issues. Please note that NO REFILLS for any discharge medications will be authorized once you are discharged, as it is imperative that you return to your primary care physician (or establish a relationship with a primary care physician if you do not have one) for your aftercare needs so that they can reassess your need for medications  and monitor your lab values.   Increase activity slowly   Complete by: As directed    No wound care   Complete by: As directed       Allergies as of 02/25/2021       Reactions   Aspirin Other (See Comments)   GI bleed   Codeine Nausea Only, Nausea And Vomiting        Medication List     STOP taking these medications    isosorbide mononitrate 30 MG 24 hr tablet Commonly known as: IMDUR   terazosin 10 MG capsule Commonly known as: HYTRIN       TAKE these medications    apixaban 2.5 MG Tabs tablet Commonly known as: ELIQUIS Take 1 tablet (2.5 mg total) by mouth 2 (two) times daily. Start taking on: February 26, 2021   ascorbic acid 500 MG tablet Commonly known as: VITAMIN C Take 1 tablet (500 mg total) by mouth daily.   carvedilol 6.25 MG tablet Commonly known as: COREG Take 1 tablet (6.25 mg total) by mouth 2 (two) times daily with a meal.   diltiazem 360 MG 24 hr capsule Commonly known as: CARDIZEM CD Take 1 capsule (360 mg total) by mouth daily.   fenofibrate 160 MG tablet Take 160 mg by mouth at bedtime.   ferrous sulfate 325 (65 FE) MG tablet Take 325 mg by mouth 2 (two) times daily with  a meal.   insulin NPH-regular Human (70-30) 100 UNIT/ML injection Inject 10-30 Units into the skin 2 (two) times daily with a meal.   multivitamin capsule Take 1 capsule by mouth daily.   pantoprazole 40 MG tablet Commonly known as: Protonix Take 1 tablet (40 mg total) by mouth daily.   sodium chloride flush 0.9 % Soln Commonly known as: NS 5 mLs by Intracatheter route every 12 (twelve) hours.   Vitamin D (Ergocalciferol) 1.25 MG (50000 UNIT) Caps capsule Commonly known as: DRISDOL Take 1 capsule (50,000 Units total) by mouth every 7 (seven) days.               Durable Medical Equipment  (From admission, onward)           Start     Ordered   02/24/21 1505  For home use only DME standard manual wheelchair with seat cushion  Once        Comments: Patient suffers from weakness  which impairs their ability to perform daily activities like toileting in the home.  A cane will not resolve issue with performing activities of daily living. A wheelchair will allow patient to safely perform daily activities. Patient can safely propel the wheelchair in the home or has a caregiver who can provide assistance. Length of need 6 months . Accessories: elevating leg rests (ELRs), wheel locks, extensions and anti-tippers.   02/24/21 1505   02/23/21 1740  For home use only DME Walker  Once       Question:  Patient needs a walker to treat with the following condition  Answer:  Weakness   02/23/21 1741            Follow-up Information     Gregory Castle, MD Follow up in 1 week(s).   Specialty: Family Medicine Contact information: [address] [place] [address] [phone]                Allergies  Allergen Reactions   Aspirin Other (See Comments)    GI bleed   Codeine Nausea Only and Nausea And Vomiting    Consultations: GI Renal General surgery   Procedures/Studies: CT ABDOMEN PELVIS WO CONTRAST  Result Date: 02/20/2021 CLINICAL DATA:  Abdominal trauma syncope, fell and pulled cholecystostomy drain EXAM: CT ABDOMEN AND PELVIS WITHOUT CONTRAST TECHNIQUE: Multidetector CT imaging of the abdomen and pelvis was performed following the standard protocol without IV contrast. COMPARISON:  01/23/2021 FINDINGS: Lower chest: No acute abnormality. Hepatobiliary: Cholecystostomy drain remains in place in a decompressed gallbladder. 10 mm gallstone noted. No focal hepatic abnormality. Pancreas: No focal abnormality or ductal dilatation. Spleen: No focal abnormality.  Normal size. Adrenals/Urinary Tract: Bilateral renal cysts, the largest in the upper pole of the left kidney measuring 4 cm. 6 mm nonobstructing left lower pole renal stone. No hydronephrosis. Adrenal glands and urinary bladder unremarkable.  Stomach/Bowel: Normal appendix. Stomach, large and small bowel grossly unremarkable. Vascular/Lymphatic: Aortic atherosclerosis. No evidence of aneurysm or adenopathy. Reproductive: Prostate enlargement Other: No free fluid or free air. Musculoskeletal: No acute bony abnormality. IMPRESSION: No evidence of injury. Cholecystostomy tube remains in decompressed gallbladder. Cholelithiasis. Left nephrolithiasis.  No hydronephrosis. Aortic atherosclerosis. Prostate enlargement. Electronically Signed   By: Rolm Baptise M.D.   On: 02/20/2021 20:44   CT HEAD WO CONTRAST (5MM)  Result Date: 02/20/2021 CLINICAL DATA:  Fall, head injury EXAM: CT HEAD WITHOUT CONTRAST TECHNIQUE: Contiguous axial images were obtained from the base of the skull through the vertex without  intravenous contrast. COMPARISON:  None. FINDINGS: Brain: Small hypodensity in the anterior limb internal capsule on the right likely of chronic ischemic insult. No other areas of infarction. No acute hemorrhage or mass. Ventricle size normal. Vascular: Negative for hyperdense vessel Skull: Negative Sinuses/Orbits: Mucosal edema paranasal sinuses. Bilateral cataract extraction Other: None IMPRESSION: No acute abnormality. Small hypodensity right internal capsule most consistent with chronic ischemia. Electronically Signed   By: Franchot Gallo M.D.   On: 02/20/2021 20:40   PERIPHERAL VASCULAR CATHETERIZATION  Result Date: 01/28/2021 See surgical note for result.  DG Chest Portable 1 View  Result Date: 02/20/2021 CLINICAL DATA:  Syncope EXAM: PORTABLE CHEST 1 VIEW COMPARISON:  01/23/2021 FINDINGS: Interval placement of right jugular central venous catheter with the tip in the lower SVC. No pneumothorax. Mild elevation right hemidiaphragm unchanged. Minimal right lower lobe atelectasis. Left lung clear. Negative for heart failure edema or effusion. IMPRESSION: Elevated right hemidiaphragm with mild right lower lobe atelectasis Central venous catheter  tip in the SVC without pneumothorax. Electronically Signed   By: Franchot Gallo M.D.   On: 02/20/2021 20:36      Subjective:  No significant events overnight, he denies any chest pain or shortness of breath. Discharge Exam: Vitals:   02/25/21 1200 02/25/21 1215  BP: 111/67 106/62  Pulse: 98 88  Resp: (!) 26 17  Temp:    SpO2: 99% 99%   Vitals:   02/25/21 1130 02/25/21 1145 02/25/21 1200 02/25/21 1215  BP: 121/71 116/67 111/67 106/62  Pulse: 92 99 98 88  Resp: (!) 25 17 (!) 26 17  Temp:      TempSrc:      SpO2: 99% 100% 99% 99%  Weight:      Height:        General: Pt is alert, awake, not in acute distress Cardiovascular: RRR, S1/S2 +, no rubs, no gallops Respiratory: CTA bilaterally, no wheezing, no rhonchi Abdominal: Soft, NT, ND, bowel sounds +, right upper quadrant cholecystostomy drain  extremities: no edema, no cyanosis    The results of significant diagnostics from this hospitalization (including imaging, microbiology, ancillary and laboratory) are listed below for reference.     Microbiology: Recent Results (from the past 240 hour(s))  Resp Panel by RT-PCR (Flu A&B, Covid) Nasopharyngeal Swab     Status: Abnormal   Collection Time: 02/20/21  8:22 PM   Specimen: Nasopharyngeal Swab; Nasopharyngeal(NP) swabs in vial transport medium  Result Value Ref Range Status   SARS Coronavirus 2 by RT PCR POSITIVE (A) NEGATIVE Final    Comment: RESULT CALLED TO, READ BACK BY AND VERIFIED WITH: WHITNEY HICKS @2203  ON 02/20/21 (NOTE) SARS-CoV-2 target nucleic acids are DETECTED.  The SARS-CoV-2 RNA is generally detectable in upper respiratory specimens during the acute phase of infection. Positive results are indicative of the presence of the identified virus, but do not rule out bacterial infection or co-infection with other pathogens not detected by the test. Clinical correlation with patient history and other diagnostic information is necessary to determine  patient infection status. The expected result is Negative.  Fact Sheet for Patients: EntrepreneurPulse.com.au  Fact Sheet for Healthcare Providers: IncredibleEmployment.be  This test is not yet approved or cleared by the [place] FDA and  has been authorized for detection and/or diagnosis of SARS-CoV-2 by FDA under an Emergency Use Authorization (EUA).  This EUA will remain in effect (meaning this test can be u sed) for the duration of  the COVID-19 declaration under Section 564(b)(1) of the Act,  21 U.S.C. section 360bbb-3(b)(1), unless the authorization is terminated or revoked sooner.     Influenza A by PCR NEGATIVE NEGATIVE Final   Influenza B by PCR NEGATIVE NEGATIVE Final    Comment: (NOTE) The Xpert Xpress SARS-CoV-2/FLU/RSV plus assay is intended as an aid in the diagnosis of influenza from Nasopharyngeal swab specimens and should not be used as a sole basis for treatment. Nasal washings and aspirates are unacceptable for Xpert Xpress SARS-CoV-2/FLU/RSV testing.  Fact Sheet for Patients: EntrepreneurPulse.com.au  Fact Sheet for Healthcare Providers: IncredibleEmployment.be  This test is not yet approved or cleared by the [place] FDA and has been authorized for detection and/or diagnosis of SARS-CoV-2 by FDA under an Emergency Use Authorization (EUA). This EUA will remain in effect (meaning this test can be used) for the duration of the COVID-19 declaration under Section 564(b)(1) of the Act, 21 U.S.C. section 360bbb-3(b)(1), unless the authorization is terminated or revoked.  Performed at [facility], [address] [place], [place] [address]      Labs: BNP (last 3 results) No results for input(s): BNP in the last 8760 hours. Basic Metabolic Panel: Recent Labs  Lab 02/20/21 2022 02/21/21 0830 02/22/21 0435 02/23/21 0625 02/25/21 0319  NA 136 137 137 134* 134*   K 3.7 4.0 3.5 3.4* 3.5  CL 102 101 104 99 103  CO2 24 26 24 26 24   GLUCOSE 154* 99 87 106* 119*  BUN 14 17 25* 19 40*  CREATININE 4.49* 5.54* 6.99* 5.14* 7.98*  CALCIUM 7.8* 8.2* 8.0* 7.9* 7.9*   Liver Function Tests: Recent Labs  Lab 02/20/21 2022  AST 22  ALT 10  ALKPHOS 36*  BILITOT 0.6  PROT 6.3*  ALBUMIN 2.7*   No results for input(s): LIPASE, AMYLASE in the last 168 hours. No results for input(s): AMMONIA in the last 168 hours. CBC: Recent Labs  Lab 02/22/21 1748 02/23/21 0625 02/23/21 1546 02/24/21 0645 02/25/21 0319  WBC 1.9* 4.5 4.2 3.7* 4.1  NEUTROABS 1.0*  --   --   --   --   HGB 7.5* 7.9* 8.0* 7.4* 8.0*  HCT 23.7* 25.0* 24.6* 23.5* 24.7*  MCV 87.8 88.3 88.8 88.3 86.4  PLT 171 167 177 156 153   Cardiac Enzymes: No results for input(s): CKTOTAL, CKMB, CKMBINDEX, TROPONINI in the last 168 hours. BNP: Invalid input(s): POCBNP CBG: Recent Labs  Lab 02/24/21 0841 02/24/21 1141 02/24/21 1631 02/24/21 2057 02/25/21 0759  GLUCAP 115* 114* 115* 144* 115*   D-Dimer No results for input(s): DDIMER in the last 72 hours. Hgb A1c No results for input(s): HGBA1C in the last 72 hours. Lipid Profile No results for input(s): CHOL, HDL, LDLCALC, TRIG, CHOLHDL, LDLDIRECT in the last 72 hours. Thyroid function studies No results for input(s): TSH, T4TOTAL, T3FREE, THYROIDAB in the last 72 hours.  Invalid input(s): FREET3 Anemia work up No results for input(s): VITAMINB12, FOLATE, FERRITIN, TIBC, IRON, RETICCTPCT in the last 72 hours. Urinalysis    Component Value Date/Time   COLORURINE YELLOW (A) 04/27/2019 2100   APPEARANCEUR Clear 06/28/2020 0900   LABSPEC 1.011 04/27/2019 2100   PHURINE 6.0 04/27/2019 2100   GLUCOSEU Trace (A) 06/28/2020 0900   HGBUR NEGATIVE 04/27/2019 2100   BILIRUBINUR Negative 06/28/2020 0900   KETONESUR NEGATIVE 04/27/2019 2100   PROTEINUR 3+ (A) 06/28/2020 0900   PROTEINUR >=300 (A) 04/27/2019 2100   NITRITE Negative  06/28/2020 0900   NITRITE NEGATIVE 04/27/2019 2100   LEUKOCYTESUR Negative 06/28/2020 0900   LEUKOCYTESUR  NEGATIVE 04/27/2019 2100   Sepsis Labs Invalid input(s): PROCALCITONIN,  WBC,  LACTICIDVEN Microbiology Recent Results (from the past 240 hour(s))  Resp Panel by RT-PCR (Flu A&B, Covid) Nasopharyngeal Swab     Status: Abnormal   Collection Time: 02/20/21  8:22 PM   Specimen: Nasopharyngeal Swab; Nasopharyngeal(NP) swabs in vial transport medium  Result Value Ref Range Status   SARS Coronavirus 2 by RT PCR POSITIVE (A) NEGATIVE Final    Comment: RESULT CALLED TO, READ BACK BY AND VERIFIED WITH: WHITNEY HICKS @2203  ON 02/20/21 (NOTE) SARS-CoV-2 target nucleic acids are DETECTED.  The SARS-CoV-2 RNA is generally detectable in upper respiratory specimens during the acute phase of infection. Positive results are indicative of the presence of the identified virus, but do not rule out bacterial infection or co-infection with other pathogens not detected by the test. Clinical correlation with patient history and other diagnostic information is necessary to determine patient infection status. The expected result is Negative.  Fact Sheet for Patients: EntrepreneurPulse.com.au  Fact Sheet for Healthcare Providers: IncredibleEmployment.be  This test is not yet approved or cleared by the [place] FDA and  has been authorized for detection and/or diagnosis of SARS-CoV-2 by FDA under an Emergency Use Authorization (EUA).  This EUA will remain in effect (meaning this test can be u sed) for the duration of  the COVID-19 declaration under Section 564(b)(1) of the Act, 21 U.S.C. section 360bbb-3(b)(1), unless the authorization is terminated or revoked sooner.     Influenza A by PCR NEGATIVE NEGATIVE Final   Influenza B by PCR NEGATIVE NEGATIVE Final    Comment: (NOTE) The Xpert Xpress SARS-CoV-2/FLU/RSV plus assay is intended as an aid in the  diagnosis of influenza from Nasopharyngeal swab specimens and should not be used as a sole basis for treatment. Nasal washings and aspirates are unacceptable for Xpert Xpress SARS-CoV-2/FLU/RSV testing.  Fact Sheet for Patients: EntrepreneurPulse.com.au  Fact Sheet for Healthcare Providers: IncredibleEmployment.be  This test is not yet approved or cleared by the [place] FDA and has been authorized for detection and/or diagnosis of SARS-CoV-2 by FDA under an Emergency Use Authorization (EUA). This EUA will remain in effect (meaning this test can be used) for the duration of the COVID-19 declaration under Section 564(b)(1) of the Act, 21 U.S.C. section 360bbb-3(b)(1), unless the authorization is terminated or revoked.  Performed at [facility], [address] [place],  [address]      Time coordinating discharge: Over 30 minutes  SIGNED:   Phillips Climes, MD  [facility] 02/25/2021, 1:00 PM Pager   If 7PM-7AM, please contact night-coverage [email or web address] Password [email or web address]

## 2021-02-25 NOTE — Plan of Care (Signed)
VSS, patient alert and oriented.  Dialysis completed patient returned to floor, IV's removed, discharge instructions read and patient discharged to home with wifr

## 2021-02-25 NOTE — Discharge Instructions (Signed)
Follow with Primary MD Gregory Castle, MD in 7 days   Get CBC, CMP,  checked  by Primary MD next visit.    Activity: As tolerated with Full fall precautions use walker/cane & assistance as needed   Disposition Home   Diet: Renal   On your next visit with your primary care physician please Get Medicines reviewed and adjusted.   Please request your Prim.MD to go over all Hospital Tests and Procedure/Radiological results at the follow up, please get all Hospital records sent to your Prim MD by signing hospital release before you go home.   If you experience worsening of your admission symptoms, develop shortness of breath, life threatening emergency, suicidal or homicidal thoughts you must seek medical attention immediately by calling 911 or calling your MD immediately  if symptoms less severe.  You Must read complete instructions/literature along with all the possible adverse reactions/side effects for all the Medicines you take and that have been prescribed to you. Take any new Medicines after you have completely understood and accpet all the possible adverse reactions/side effects.   Do not drive, operating heavy machinery, perform activities at heights, swimming or participation in water activities or provide baby sitting services if your were admitted for syncope or siezures until you have seen by Primary MD or a Neurologist and advised to do so again.  Do not drive when taking Pain medications.    Do not take more than prescribed Pain, Sleep and Anxiety Medications  Special Instructions: If you have smoked or chewed Tobacco  in the last 2 yrs please stop smoking, stop any regular Alcohol  and or any Recreational drug use.  Wear Seat belts while driving.   Please note  You were cared for by a hospitalist during your hospital stay. If you have any questions about your discharge medications or the care you received while you were in the hospital after you are discharged,  you can call the unit and asked to speak with the hospitalist on call if the hospitalist that took care of you is not available. Once you are discharged, your primary care physician will handle any further medical issues. Please note that NO REFILLS for any discharge medications will be authorized once you are discharged, as it is imperative that you return to your primary care physician (or establish a relationship with a primary care physician if you do not have one) for your aftercare needs so that they can reassess your need for medications and monitor your lab values.

## 2021-02-25 NOTE — Progress Notes (Signed)
[facility]  ROUNDING NOTE   Subjective:   EGD with no lesions. Colonoscopy with several polyps removed, lipoma found and internal hemorrhoids. Today, Patient seen during dialysis Tolerating well    HEMODIALYSIS FLOWSHEET:  Blood Flow Rate (mL/min): 400 mL/min Arterial Pressure (mmHg): -120 mmHg Venous Pressure (mmHg): 80 mmHg Transmembrane Pressure (mmHg): 70 mmHg Ultrafiltration Rate (mL/min): 670 mL/min Dialysate Flow Rate (mL/min): 500 ml/min Conductivity: Machine : 13.8 Conductivity: Machine : 13.8   BP dropped during treatment UF discontinued Patient asymptomatic    Objective:  Vital signs in last 24 hours:  Temp:  [96.6 F (35.9 C)-98.6 F (37 C)] 98.4 F (36.9 C) (11/21 0952) Pulse Rate:  [59-98] 84 (11/21 1013) Resp:  [14-25] 19 (11/21 1013) BP: (85-142)/(47-118) 142/118 (11/21 0952) SpO2:  [97 %-100 %] 99 % (11/21 1013)  Weight change:  Filed Weights   02/20/21 1949 02/21/21 2104  Weight: 94.3 kg 93.4 kg    Intake/Output: I/O last 3 completed shifts: In: 357 [I.V.:52; Blood:300; Other:5] Out: 350 [Drains:350]   Intake/Output this shift:  No intake/output data recorded.  Physical Exam: General: NAD, laying in bed  Head: Normocephalic, atraumatic. Moist oral mucosal membranes  Neck: Supple,  Lungs:  Clear to auscultation  Heart: Irregular, tachycardia  Abdomen:  Soft, nontender, RUQ drain   Extremities:  no peripheral edema.  Neurologic: Nonfocal, moving all four extremities  Skin: No lesions  Access: RIJ permcath    Basic Metabolic Panel: Recent Labs  Lab 02/20/21 2022 02/21/21 0830 02/22/21 0435 02/23/21 0625 02/25/21 0319  NA 136 137 137 134* 134*  K 3.7 4.0 3.5 3.4* 3.5  CL 102 101 104 99 103  CO2 24 26 24 26 24   GLUCOSE 154* 99 87 106* 119*  BUN 14 17 25* 19 40*  CREATININE 4.49* 5.54* 6.99* 5.14* 7.98*  CALCIUM 7.8* 8.2* 8.0* 7.9* 7.9*     Liver Function Tests: Recent Labs  Lab 02/20/21 2022  AST 22   ALT 10  ALKPHOS 36*  BILITOT 0.6  PROT 6.3*  ALBUMIN 2.7*    No results for input(s): LIPASE, AMYLASE in the last 168 hours. No results for input(s): AMMONIA in the last 168 hours.  CBC: Recent Labs  Lab 02/22/21 1748 02/23/21 0625 02/23/21 1546 02/24/21 0645 02/25/21 0319  WBC 1.9* 4.5 4.2 3.7* 4.1  NEUTROABS 1.0*  --   --   --   --   HGB 7.5* 7.9* 8.0* 7.4* 8.0*  HCT 23.7* 25.0* 24.6* 23.5* 24.7*  MCV 87.8 88.3 88.8 88.3 86.4  PLT 171 167 177 156 153     Cardiac Enzymes: No results for input(s): CKTOTAL, CKMB, CKMBINDEX, TROPONINI in the last 168 hours.  BNP: Invalid input(s): POCBNP  CBG: Recent Labs  Lab 02/24/21 0841 02/24/21 1141 02/24/21 1631 02/24/21 2057 02/25/21 0759  GLUCAP 115* 114* 115* 144* 115*     Microbiology: Results for orders placed or performed during the hospital encounter of 02/20/21  Resp Panel by RT-PCR (Flu A&B, Covid) Nasopharyngeal Swab     Status: Abnormal   Collection Time: 02/20/21  8:22 PM   Specimen: Nasopharyngeal Swab; Nasopharyngeal(NP) swabs in vial transport medium  Result Value Ref Range Status   SARS Coronavirus 2 by RT PCR POSITIVE (A) NEGATIVE Final    Comment: RESULT CALLED TO, READ BACK BY AND VERIFIED WITH: WHITNEY HICKS @2203  ON 02/20/21 (NOTE) SARS-CoV-2 target nucleic acids are DETECTED.  The SARS-CoV-2 RNA is generally detectable in upper respiratory specimens during the acute phase  of infection. Positive results are indicative of the presence of the identified virus, but do not rule out bacterial infection or co-infection with other pathogens not detected by the test. Clinical correlation with patient history and other diagnostic information is necessary to determine patient infection status. The expected result is Negative.  Fact Sheet for Patients: EntrepreneurPulse.com.au  Fact Sheet for Healthcare Providers: IncredibleEmployment.be  This test is not yet  approved or cleared by the [place] FDA and  has been authorized for detection and/or diagnosis of SARS-CoV-2 by FDA under an Emergency Use Authorization (EUA).  This EUA will remain in effect (meaning this test can be u sed) for the duration of  the COVID-19 declaration under Section 564(b)(1) of the Act, 21 U.S.C. section 360bbb-3(b)(1), unless the authorization is terminated or revoked sooner.     Influenza A by PCR NEGATIVE NEGATIVE Final   Influenza B by PCR NEGATIVE NEGATIVE Final    Comment: (NOTE) The Xpert Xpress SARS-CoV-2/FLU/RSV plus assay is intended as an aid in the diagnosis of influenza from Nasopharyngeal swab specimens and should not be used as a sole basis for treatment. Nasal washings and aspirates are unacceptable for Xpert Xpress SARS-CoV-2/FLU/RSV testing.  Fact Sheet for Patients: EntrepreneurPulse.com.au  Fact Sheet for Healthcare Providers: IncredibleEmployment.be  This test is not yet approved or cleared by the [place] FDA and has been authorized for detection and/or diagnosis of SARS-CoV-2 by FDA under an Emergency Use Authorization (EUA). This EUA will remain in effect (meaning this test can be used) for the duration of the COVID-19 declaration under Section 564(b)(1) of the Act, 21 U.S.C. section 360bbb-3(b)(1), unless the authorization is terminated or revoked.  Performed at [facility], [address] [place], [place] [address]     Coagulation Studies: No results for input(s): LABPROT, INR in the last 72 hours.  Urinalysis: No results for input(s): COLORURINE, LABSPEC, PHURINE, GLUCOSEU, HGBUR, BILIRUBINUR, KETONESUR, PROTEINUR, UROBILINOGEN, NITRITE, LEUKOCYTESUR in the last 72 hours.  Invalid input(s): APPERANCEUR    Imaging: No results found.   Medications:    sodium chloride      (feeding supplement) PROSource Plus  30 mL Oral TID BM   sodium chloride   Intravenous Once    vitamin C  500 mg Oral BID   carvedilol  6.25 mg Oral BID WC   Chlorhexidine Gluconate Cloth  6 each Topical Q0600   diltiazem  360 mg Oral Daily   diltiazem  10 mg Intravenous Once   feeding supplement  1 Container Oral TID BM   insulin aspart  0-5 Units Subcutaneous QHS   insulin aspart  0-6 Units Subcutaneous TID WC   multivitamin  1 tablet Oral QHS   multivitamin with minerals  1 tablet Oral Daily   sodium chloride flush  10 mL Intracatheter Q12H   ondansetron **OR** ondansetron (ZOFRAN) IV  Assessment/ Plan:  Gregory Dixon is a 80 y.o. white male with end stage renal disease on hemodialysis, hypertension, hyperlipidemia, atrial fibrillation, diabetes mellitus type II who is admitted to [facility] on 02/20/2021 for Rectal bleeding [K62.5] ESRD on dialysis ([place]) [N18.6, Z99.2] Symptomatic anemia [D64.9] Acute anemia [D64.9]  [facility] RIJ permcath 95.5kg  End Stage Renal Disease: MWF schedule.  Patient expected to d/c  to home after HD today Tolerating HD well. States he can walk and is able to sit in chair  Hypertension with chronic kidney disease: with Atrial fibrillation with RVR. Concern for syncopal episode.  - Continue diltiazem and carvedilol.  -  Appreciate cardiology input  Anemia with chronic kidney disease: hemoglobin 8. Receiving mircera 60 units with outpatient dialysis. Now with lower GI bleed. Outpatient labs consistent with iron deficiency. Received blood transfusion this admission - Appreciate GI input.   Secondary Hyperparathyroidism: outpatient labs from 11/14 with Phos 5.1, calcium 8, PTH elevated at 720. Not currently on binders.  5. Acute cholecystitis s/p decompression by cholecystostomy tube  F/u with Dr Lisabeth Pick for definitive surgical management   LOS: 4 Dustina Scoggin 11/21/202211:13 AM

## 2021-02-26 LAB — SURGICAL PATHOLOGY

## 2021-02-28 ENCOUNTER — Other Ambulatory Visit: Payer: Self-pay

## 2021-02-28 ENCOUNTER — Inpatient Hospital Stay
Admission: EM | Admit: 2021-02-28 | Discharge: 2021-03-06 | DRG: 347 | Disposition: A | Discharge status: Home / Self Care | Payer: Medicare Other | Attending: Hospitalist | Admitting: Hospitalist

## 2021-02-28 DIAGNOSIS — K219 Gastro-esophageal reflux disease without esophagitis: Secondary | ICD-10-CM | POA: Diagnosis present

## 2021-02-28 DIAGNOSIS — Z87891 Personal history of nicotine dependence: Secondary | ICD-10-CM

## 2021-02-28 DIAGNOSIS — Z66 Do not resuscitate: Secondary | ICD-10-CM | POA: Diagnosis present

## 2021-02-28 DIAGNOSIS — D62 Acute posthemorrhagic anemia: Secondary | ICD-10-CM | POA: Diagnosis present

## 2021-02-28 DIAGNOSIS — Z992 Dependence on renal dialysis: Secondary | ICD-10-CM

## 2021-02-28 DIAGNOSIS — E43 Unspecified severe protein-calorie malnutrition: Secondary | ICD-10-CM | POA: Diagnosis present

## 2021-02-28 DIAGNOSIS — Z8719 Personal history of other diseases of the digestive system: Secondary | ICD-10-CM | POA: Diagnosis not present

## 2021-02-28 DIAGNOSIS — D649 Anemia, unspecified: Secondary | ICD-10-CM | POA: Diagnosis not present

## 2021-02-28 DIAGNOSIS — D631 Anemia in chronic kidney disease: Secondary | ICD-10-CM | POA: Diagnosis present

## 2021-02-28 DIAGNOSIS — I959 Hypotension, unspecified: Secondary | ICD-10-CM | POA: Diagnosis present

## 2021-02-28 DIAGNOSIS — R531 Weakness: Secondary | ICD-10-CM

## 2021-02-28 DIAGNOSIS — Z7901 Long term (current) use of anticoagulants: Secondary | ICD-10-CM

## 2021-02-28 DIAGNOSIS — E8809 Other disorders of plasma-protein metabolism, not elsewhere classified: Secondary | ICD-10-CM | POA: Diagnosis present

## 2021-02-28 DIAGNOSIS — K643 Fourth degree hemorrhoids: Principal | ICD-10-CM | POA: Diagnosis present

## 2021-02-28 DIAGNOSIS — D175 Benign lipomatous neoplasm of intra-abdominal organs: Secondary | ICD-10-CM | POA: Diagnosis present

## 2021-02-28 DIAGNOSIS — N2581 Secondary hyperparathyroidism of renal origin: Secondary | ICD-10-CM | POA: Diagnosis present

## 2021-02-28 DIAGNOSIS — Z794 Long term (current) use of insulin: Secondary | ICD-10-CM | POA: Diagnosis not present

## 2021-02-28 DIAGNOSIS — I10 Essential (primary) hypertension: Secondary | ICD-10-CM | POA: Diagnosis not present

## 2021-02-28 DIAGNOSIS — Z8249 Family history of ischemic heart disease and other diseases of the circulatory system: Secondary | ICD-10-CM | POA: Diagnosis not present

## 2021-02-28 DIAGNOSIS — U071 COVID-19: Secondary | ICD-10-CM | POA: Diagnosis not present

## 2021-02-28 DIAGNOSIS — E876 Hypokalemia: Secondary | ICD-10-CM | POA: Diagnosis present

## 2021-02-28 DIAGNOSIS — E1122 Type 2 diabetes mellitus with diabetic chronic kidney disease: Secondary | ICD-10-CM | POA: Diagnosis present

## 2021-02-28 DIAGNOSIS — Z85828 Personal history of other malignant neoplasm of skin: Secondary | ICD-10-CM

## 2021-02-28 DIAGNOSIS — N186 End stage renal disease: Secondary | ICD-10-CM | POA: Diagnosis present

## 2021-02-28 DIAGNOSIS — K635 Polyp of colon: Secondary | ICD-10-CM | POA: Diagnosis present

## 2021-02-28 DIAGNOSIS — I4891 Unspecified atrial fibrillation: Secondary | ICD-10-CM | POA: Diagnosis present

## 2021-02-28 DIAGNOSIS — E1165 Type 2 diabetes mellitus with hyperglycemia: Secondary | ICD-10-CM | POA: Diagnosis present

## 2021-02-28 DIAGNOSIS — I120 Hypertensive chronic kidney disease with stage 5 chronic kidney disease or end stage renal disease: Secondary | ICD-10-CM | POA: Diagnosis present

## 2021-02-28 DIAGNOSIS — K625 Hemorrhage of anus and rectum: Secondary | ICD-10-CM | POA: Diagnosis not present

## 2021-02-28 DIAGNOSIS — K922 Gastrointestinal hemorrhage, unspecified: Secondary | ICD-10-CM | POA: Diagnosis not present

## 2021-02-28 DIAGNOSIS — Z8616 Personal history of COVID-19: Secondary | ICD-10-CM | POA: Diagnosis not present

## 2021-02-28 LAB — COMPREHENSIVE METABOLIC PANEL
ALT: 18 U/L (ref 0–44)
AST: 29 U/L (ref 15–41)
Albumin: 2.3 g/dL — ABNORMAL LOW (ref 3.5–5.0)
Alkaline Phosphatase: 43 U/L (ref 38–126)
Anion gap: 11 (ref 5–15)
BUN: 28 mg/dL — ABNORMAL HIGH (ref 8–23)
CO2: 20 mmol/L — ABNORMAL LOW (ref 22–32)
Calcium: 7.7 mg/dL — ABNORMAL LOW (ref 8.9–10.3)
Chloride: 106 mmol/L (ref 98–111)
Creatinine, Ser: 5.7 mg/dL — ABNORMAL HIGH (ref 0.61–1.24)
GFR, Estimated: 9 mL/min — ABNORMAL LOW (ref >60)
Glucose, Bld: 134 mg/dL — ABNORMAL HIGH (ref 70–99)
Potassium: 3.4 mmol/L — ABNORMAL LOW (ref 3.5–5.1)
Sodium: 137 mmol/L (ref 135–145)
Total Bilirubin: 0.7 mg/dL (ref 0.3–1.2)
Total Protein: 5.4 g/dL — ABNORMAL LOW (ref 6.5–8.1)

## 2021-02-28 LAB — PREPARE RBC (CROSSMATCH)

## 2021-02-28 LAB — CBC
HCT: 23.3 % — ABNORMAL LOW (ref 39.0–52.0)
Hemoglobin: 7.2 g/dL — ABNORMAL LOW (ref 13.0–17.0)
MCH: 28.9 pg (ref 26.0–34.0)
MCHC: 30.9 g/dL (ref 30.0–36.0)
MCV: 93.6 fL (ref 80.0–100.0)
Platelets: 191 10*3/uL (ref 150–400)
RBC: 2.49 MIL/uL — ABNORMAL LOW (ref 4.22–5.81)
RDW: 14.9 % (ref 11.5–15.5)
WBC: 8.9 10*3/uL (ref 4.0–10.5)
nRBC: 0 % (ref 0.0–0.2)

## 2021-02-28 LAB — RESP PANEL BY RT-PCR (FLU A&B, COVID) ARPGX2
Influenza A by PCR: NEGATIVE
Influenza B by PCR: NEGATIVE
SARS Coronavirus 2 by RT PCR: POSITIVE — AB

## 2021-02-28 LAB — PROTIME-INR
INR: 1.5 — ABNORMAL HIGH (ref 0.8–1.2)
Prothrombin Time: 18.3 seconds — ABNORMAL HIGH (ref 11.4–15.2)

## 2021-02-28 LAB — MAGNESIUM: Magnesium: 2 mg/dL (ref 1.7–2.4)

## 2021-02-28 MED ORDER — SODIUM CHLORIDE 0.9 % IV BOLUS
1000.0000 mL | Freq: Once | INTRAVENOUS | Status: AC
  Administered 2021-02-28: 1000 mL via INTRAVENOUS

## 2021-02-28 MED ORDER — GLUCERNA SHAKE PO LIQD: 237.0000 mL | Freq: Three times a day (TID) | ORAL | Status: DC

## 2021-02-28 MED ORDER — AMIODARONE HCL IN DEXTROSE 360-4.14 MG/200ML-% IV SOLN: 30.0000 mg/h | INTRAVENOUS | Status: DC

## 2021-02-28 MED ORDER — SODIUM CHLORIDE 0.9 % IV SOLN: 10.0000 mL/h | Freq: Once | INTRAVENOUS | Status: AC

## 2021-02-28 MED ORDER — DILTIAZEM HCL 25 MG/5ML IV SOLN
10.0000 mg | Freq: Once | INTRAVENOUS | Status: AC
  Administered 2021-02-28: 10 mg via INTRAVENOUS
  Filled 2021-02-28: qty 5

## 2021-02-28 MED ORDER — AMIODARONE HCL IN DEXTROSE 360-4.14 MG/200ML-% IV SOLN
60.0000 mg/h | INTRAVENOUS | Status: DC
  Administered 2021-02-28: 60 mg/h via INTRAVENOUS
  Filled 2021-02-28: qty 200

## 2021-02-28 MED ORDER — SODIUM CHLORIDE 0.9 % IV BOLUS: 500.0000 mL | Freq: Once | INTRAVENOUS | Status: DC

## 2021-02-28 MED ORDER — DILTIAZEM HCL-DEXTROSE 125-5 MG/125ML-% IV SOLN (PREMIX)
5.0000 mg/h | INTRAVENOUS | Status: DC
  Administered 2021-02-28: 5 mg/h via INTRAVENOUS
  Filled 2021-02-28: qty 125

## 2021-02-28 MED ORDER — POTASSIUM CHLORIDE CRYS ER 20 MEQ PO TBCR
20.0000 meq | EXTENDED_RELEASE_TABLET | Freq: Once | ORAL | Status: AC
  Administered 2021-02-28: 20 meq via ORAL
  Filled 2021-02-28: qty 1

## 2021-02-28 NOTE — ED Notes (Signed)
Md Paduchowski aware of pt's HR approaching 160's. EKG repeated. See orders.

## 2021-02-28 NOTE — Progress Notes (Signed)
RN called due to patient's BP dropping to 88/50 despite changing Cardizem drip to amiodarone drip.  This will be stopped at this time, patient continues to be in A. fib but currently with rate control.  Consider starting home Coreg and Cardizem when BP improves.

## 2021-02-28 NOTE — ED Notes (Signed)
Upon going to start pt's amiodarone, IV found to be not patent. Blood transfusion moved to working line and provider notified at this time that we are unable to start amio drip until another line is established.

## 2021-02-28 NOTE — ED Notes (Signed)
Morgan RN aware of assigned bed 

## 2021-02-28 NOTE — ED Notes (Signed)
Provider messaged about BP at this time

## 2021-02-28 NOTE — ED Provider Notes (Signed)
[facility] Emergency Department Provider Note  Time seen: 11:04 AM  I have reviewed the triage vital signs and the nursing notes.   HISTORY  Chief Complaint GI Bleeding   HPI Gregory Dixon is a 80 y.o. male with a past medical history of diabetes, chronic blood loss/GI bleed, hypertension, ESRD on HD Monday/Wednesday/Friday, currently on Eliquis, presents to the emergency department for weakness and rectal bleeding.  According to the patient over the past 2 days he has been intermittently experiencing rectal bleeding which he describes as fairly significant amount of bright red blood per rectum.  This morning wife states the patient was in the bathroom and appeared to be very weak as if he was going to pass out.  The wife helped the patient down to the floor and noted that the toilet was full of bright red blood.  Patient states a long history of GI bleeding intermittently, states he received a blood transfusion 2 weeks ago, as well as last week.  Patient states he had a colonoscopy on Monday by Dr. Allen Norris with no significant findings.  Currently patient appears pale, tachycardic around 130 bpm.  Denies any abdominal pain.  Denies any nausea or vomiting.   Past Medical History:  Diagnosis Date   Actinic keratosis    Basal cell carcinoma 10/21/2017   left lateral neck, excised 02/09/2018   Basal cell carcinoma (BCC) 05/12/2019   right posterior ear, excised 05/31/2019   Blood transfusion without reported diagnosis    Diabetes mellitus without complication [facility])    Dysrhythmia     Patient Active Problem List   Diagnosis Date Noted   Protein-calorie malnutrition, severe 02/23/2021   Symptomatic anemia 02/21/2021   Cholecystitis 01/24/2021   Weakness 01/23/2021   Elevated LFTs 01/23/2021   Transaminitis 01/23/2021   MGUS (monoclonal gammopathy of unknown significance)    Thrombocytopenia (HCC)    Acute anemia 11/23/2020   Anemia associated with chronic renal  failure 09/09/2019   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   Chronic kidney disease 05/11/2019   Hyperlipidemia 05/11/2019   Hypertension, 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   Anemia due to vitamin B12 deficiency 05/05/2019   Benign prostatic hyperplasia 05/05/2019   Acute lower GI bleeding 04/27/2019   Acute blood loss anemia 04/27/2019   Acute kidney injury superimposed on CKD ([place]) 04/27/2019   Iron deficiency anemia due to chronic blood loss 04/27/2019   Type 2 diabetes mellitus with stage 4 chronic kidney disease, without long-term current use of insulin ([place]) 10/16/2015   Essential hypertension 10/16/2015   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   Atherosclerosis 09/29/2012   Cholelithiasis 09/29/2012   Nephrolithiasis 09/29/2012   Diastasis recti 08/23/2012   Diabetic peripheral neuropathy associated with type 2 diabetes mellitus ([place]) 02/20/2011   Heart murmur, systolic 29/93/7169   Loss of feeling or sensation 02/20/2011   Obesity 02/20/2011    Past Surgical History:  Procedure Laterality Date   COLONOSCOPY N/A 02/24/2021   Procedure: COLONOSCOPY;  Surgeon: [name], MD;  Location: [facility] ENDOSCOPY;  Service: Endoscopy;  Laterality: N/A;   COLONOSCOPY WITH PROPOFOL N/A 04/29/2019   Procedure: COLONOSCOPY WITH PROPOFOL;  Surgeon: Jonathon Bellows, MD;  Location: [facility] ENDOSCOPY;  Service: Gastroenterology;  Laterality: N/A;   DIALYSIS/PERMA CATHETER INSERTION Bilateral 01/28/2021   Procedure: DIALYSIS/PERMA CATHETER INSERTION;  Surgeon: Algernon Huxley, MD;  Location: [facility] CV LAB;  Service: Cardiovascular;  Laterality: Bilateral;   ESOPHAGOGASTRODUODENOSCOPY N/A 02/24/2021   Procedure: ESOPHAGOGASTRODUODENOSCOPY (EGD);  Surgeon: [name], MD;  Location: [facility]  ENDOSCOPY;  Service: Endoscopy;  Laterality: N/A;   ESOPHAGOGASTRODUODENOSCOPY (EGD) WITH PROPOFOL N/A 04/29/2019   Procedure: ESOPHAGOGASTRODUODENOSCOPY (EGD) WITH PROPOFOL;  Surgeon: Jonathon Bellows, MD;  Location: [facility] ENDOSCOPY;   Service: Gastroenterology;  Laterality: N/A;   IR PERC CHOLECYSTOSTOMY  01/25/2021    Prior to Admission medications   Medication Sig Start Date End Date Taking? Authorizing Provider  apixaban (ELIQUIS) 2.5 MG TABS tablet Take 1 tablet (2.5 mg total) by mouth 2 (two) times daily. 02/26/21   Elgergawy, Silver Huguenin, MD  ascorbic acid (VITAMIN C) 500 MG tablet Take 1 tablet (500 mg total) by mouth daily. 02/01/21 03/03/21  Val Riles, MD  carvedilol (COREG) 6.25 MG tablet Take 1 tablet (6.25 mg total) by mouth 2 (two) times daily with a meal. 01/31/21 03/02/21  Val Riles, MD  diltiazem (CARDIZEM CD) 360 MG 24 hr capsule Take 1 capsule (360 mg total) by mouth daily. 02/01/21 03/03/21  Val Riles, MD  fenofibrate 160 MG tablet Take 160 mg by mouth at bedtime.    [provider]  ferrous sulfate 325 (65 FE) MG tablet Take 325 mg by mouth 2 (two) times daily with a meal.    [provider]  insulin NPH-regular Human (70-30) 100 UNIT/ML injection Inject 10-30 Units into the skin 2 (two) times daily with a meal.    [provider]  Multiple Vitamin (MULTIVITAMIN) capsule Take 1 capsule by mouth daily.    [provider]  pantoprazole (PROTONIX) 40 MG tablet Take 1 tablet (40 mg total) by mouth daily. Patient not taking: Reported on 02/13/2021 11/25/20 12/25/20  Loletha Grayer, MD  Vitamin D, Ergocalciferol, (DRISDOL) 1.25 MG (50000 UNIT) CAPS capsule Take 1 capsule (50,000 Units total) by mouth every 7 (seven) days. 02/07/21 05/08/21  Val Riles, MD    Allergies  Allergen Reactions   Aspirin Other (See Comments)    GI bleed   Codeine Nausea Only and Nausea And Vomiting    Family History  Problem Relation Age of Onset   Hypertension Father     Social History Social History   Tobacco Use   Smoking status: Former   Smokeless tobacco: Never  Scientific laboratory technician Use: Never used  Substance Use Topics   Alcohol use: Not Currently    Comment: rare  once a year   Drug use: Never    Review of Systems Constitutional: Negative for fever.  Positive for generalized weakness Cardiovascular: Negative for chest pain. Respiratory: Negative for shortness of breath. Gastrointestinal: Negative for abdominal pain.  Positive for bright red blood per rectum Genitourinary: Negative for urinary compaints Musculoskeletal: Negative for musculoskeletal complaints Neurological: Negative for headache All other ROS negative  ____________________________________________   PHYSICAL EXAM:  VITAL SIGNS: ED Triage Vitals  Enc Vitals Group     BP 02/28/21 1028 (!) 132/53     Pulse Rate 02/28/21 1028 75     Resp 02/28/21 1028 18     Temp 02/28/21 1028 (!) 97.5 F (36.4 C)     Temp Source 02/28/21 1028 Oral     SpO2 02/28/21 1028 93 %     Weight 02/28/21 1025 196 lb (88.9 kg)     Height 02/28/21 1025 6\' 6"  (1.981 m)     Head Circumference --      Peak Flow --      Pain Score 02/28/21 1025 0     Pain Loc --      Pain Edu? --  Excl. in [place]? --    Constitutional: Alert and oriented. Well appearing and in no distress. Eyes: Normal exam ENT      Head: Normocephalic and atraumatic.      Mouth/Throat: Mucous membranes are moist.  Pale mucous membranes. Cardiovascular: Normal rate, regular rhythm.  Respiratory: Normal respiratory effort without tachypnea nor retractions. Breath sounds are clear  Gastrointestinal: Soft and nontender. No distention.  Rectal examination shows brown stool mix of bright red blood strongly guaiac positive.  Nontender. Musculoskeletal: Nontender with normal range of motion in all extremities. No lower extremity tenderness or edema. Neurologic:  Normal speech and language. No gross focal neurologic deficits are appreciated. Skin:  Skin is warm, somewhat pale in appearance. Psychiatric: Mood and affect are normal. Speech and behavior are normal.   ____________________________________________    EKG  EKG viewed and  interpreted by myself shows atrial fibrillation at 153 bpm with a narrow QRS, normal axis, normal intervals, nonspecific ST changes.  ____________________________________________   INITIAL IMPRESSION / ASSESSMENT AND PLAN / ED COURSE  Pertinent labs & imaging results that were available during my care of the patient were reviewed by me and considered in my medical decision making (see chart for details).   Patient presents to the emergency department for generalized weakness fatigue.  Patient noted to be pale with pale mucous membranes, atrial fibrillation with rapid ventricular response around 140 bpm.  Patient is on Eliquis recently had a colonoscopy 4 days ago, possibly could have GI bleed due to polyp removal and being on Eliquis, is not entirely clear.  Rectal examination shows brown stool but also bright red blood.  Blood pressures responded very well currently 148/122.  We will stop the normal saline.  I have ordered 2 units of blood given the patient's hemoglobin of 7.2 with active bleeding.  I spoke to Dr. Candiss Norse of nephrology as the patient may very well require dialysis at some point given the fluids and blood administration.  Currently potassium reassuring 3.4 and no oxygen requirement or shortness of breath.  Patient is in atrial fibrillation with rapid ventricular response around 150 bpm.  We will dose 10 mg of IV diltiazem and reassess.  Heart rate remains around 120 to 130 bpm atrial fibrillation.  We will start on a diltiazem infusion.  Patient is COVID-positive however states he tested +2 weeks ago and was cleared last week from isolation.  I messaged Dr. Allen Norris and he is aware that the patient is in the emergency department and will be admitted.  We will admit to the hospital service.  Karsyn Rochin was evaluated in Emergency Department on 02/28/2021 for the symptoms described in the history of present illness. He was evaluated in the context of the global COVID-19 pandemic, which  necessitated consideration that the patient might be at risk for infection with the SARS-CoV-2 virus that causes COVID-19. Institutional protocols and algorithms that pertain to the evaluation of patients at risk for COVID-19 are in a state of rapid change based on information released by regulatory bodies including the CDC and federal and state organizations. These policies and algorithms were followed during the patient's care in the ED.  CRITICAL CARE Performed by: Harvest Dark   Total critical care time: 45 minutes  Critical care time was exclusive of separately billable procedures and treating other patients.  Critical care was necessary to treat or prevent imminent or life-threatening deterioration.  Critical care was time spent personally by me on the following activities: development of treatment plan  with patient and/or surrogate as well as nursing, discussions with consultants, evaluation of patient's response to treatment, examination of patient, obtaining history from patient or surrogate, ordering and performing treatments and interventions, ordering and review of laboratory studies, ordering and review of radiographic studies, pulse oximetry and re-evaluation of patient's condition.  ____________________________________________   FINAL CLINICAL IMPRESSION(S) / ED DIAGNOSES  Atrial fibrillation with rapid ventricular response. GI bleed Symptomatic anemia   Harvest Dark, MD 02/28/21 1153

## 2021-02-28 NOTE — Consult Note (Signed)
Gregory Lame, MD [facility]  [address] [place], [place] [address] Phone: [phone] Fax : [phone]  Consultation  Referring Provider:     No ref. provider found Primary Care Physician:  Valera Castle, MD Primary Gastroenterologist:  Dr. Vicente Males         Reason for Consultation:     Rectal bleeding  Date of Admission:  02/28/2021 Date of Consultation:  02/28/2021         HPI:   Gregory Dixon is a 80 y.o. male who was in the hospital after having rectal bleeding last week. The patient was seen by Dr. Bonna Gains and she had signed out the patient to have an EGD and colonoscopy by me last weekend.  The EGD and colonoscopy did not show any source of GI bleeding despite the patient reporting bright red blood with every bowel movement.  Entire colon was devoid of any old blood or new blood at that time.  It was thought that the patient may have a hemorrhoidal bleed.  The patient states he went home and continued to bleed and was put back on his anticoagulation.  The patient was then admitted to the ER today with continued bleeding hypotension and anemia.The patient left the hospital with a hemoglobin of 8.0 and came in today at 7.2. He does report some right upper quadrant pain where he has a drain in place for gallbladder drainage.  Past Medical History:  Diagnosis Date   Actinic keratosis    Basal cell carcinoma 10/21/2017   left lateral neck, excised 02/09/2018   Basal cell carcinoma (BCC) 05/12/2019   right posterior ear, excised 05/31/2019   Blood transfusion without reported diagnosis    Diabetes mellitus without complication [facility])    Dysrhythmia     Past Surgical History:  Procedure Laterality Date   COLONOSCOPY N/A 02/24/2021   Procedure: COLONOSCOPY;  Surgeon: Gregory Lame, MD;  Location: [facility] ENDOSCOPY;  Service: Endoscopy;  Laterality: N/A;   COLONOSCOPY WITH PROPOFOL N/A 04/29/2019   Procedure: COLONOSCOPY WITH PROPOFOL;  Surgeon: Jonathon Bellows, MD;  Location:  [facility] ENDOSCOPY;  Service: Gastroenterology;  Laterality: N/A;   DIALYSIS/PERMA CATHETER INSERTION Bilateral 01/28/2021   Procedure: DIALYSIS/PERMA CATHETER INSERTION;  Surgeon: Algernon Huxley, MD;  Location: [facility] LAB;  Service: Cardiovascular;  Laterality: Bilateral;   ESOPHAGOGASTRODUODENOSCOPY N/A 02/24/2021   Procedure: ESOPHAGOGASTRODUODENOSCOPY (EGD);  Surgeon: Gregory Lame, MD;  Location: [facility] ENDOSCOPY;  Service: Endoscopy;  Laterality: N/A;   ESOPHAGOGASTRODUODENOSCOPY (EGD) WITH PROPOFOL N/A 04/29/2019   Procedure: ESOPHAGOGASTRODUODENOSCOPY (EGD) WITH PROPOFOL;  Surgeon: Jonathon Bellows, MD;  Location: [facility] ENDOSCOPY;  Service: Gastroenterology;  Laterality: N/A;   IR PERC CHOLECYSTOSTOMY  01/25/2021    Prior to Admission medications   Medication Sig Start Date End Date Taking? Authorizing Provider  apixaban (ELIQUIS) 2.5 MG TABS tablet Take 1 tablet (2.5 mg total) by mouth 2 (two) times daily. 02/26/21   Elgergawy, Silver Huguenin, MD  ascorbic acid (VITAMIN C) 500 MG tablet Take 1 tablet (500 mg total) by mouth daily. 02/01/21 03/03/21  Val Riles, MD  carvedilol (COREG) 6.25 MG tablet Take 1 tablet (6.25 mg total) by mouth 2 (two) times daily with a meal. 01/31/21 03/02/21  Val Riles, MD  diltiazem (CARDIZEM CD) 360 MG 24 hr capsule Take 1 capsule (360 mg total) by mouth daily. 02/01/21 03/03/21  Val Riles, MD  fenofibrate 160 MG tablet Take 160 mg by mouth at bedtime.    [provider]  ferrous sulfate 325 (  65 FE) MG tablet Take 325 mg by mouth 2 (two) times daily with a meal.    [provider]  insulin NPH-regular Human (70-30) 100 UNIT/ML injection Inject 10-30 Units into the skin 2 (two) times daily with a meal.    [provider]  Multiple Vitamin (MULTIVITAMIN) capsule Take 1 capsule by mouth daily.    [provider]  pantoprazole (PROTONIX) 40 MG tablet Take 1 tablet (40 mg total) by mouth daily. Patient not taking: Reported on  02/13/2021 11/25/20 12/25/20  Loletha Grayer, MD  Vitamin D, Ergocalciferol, (DRISDOL) 1.25 MG (50000 UNIT) CAPS capsule Take 1 capsule (50,000 Units total) by mouth every 7 (seven) days. 02/07/21 05/08/21  Val Riles, MD    Family History  Problem Relation Age of Onset   Hypertension Father      Social History   Tobacco Use   Smoking status: Former   Smokeless tobacco: Never  Vaping Use   Vaping Use: Never used  Substance Use Topics   Alcohol use: Not Currently    Comment: rare once a year   Drug use: Never    Allergies as of 02/28/2021 - Review Complete 02/24/2021  Allergen Reaction Noted   Aspirin Other (See Comments) 10/20/2016   Codeine Nausea Only and Nausea And Vomiting 02/20/2011    Review of Systems:    All systems reviewed and negative except where noted in HPI.   Physical Exam:  Vital signs in last 24 hours: Temp:  [97.5 F (36.4 C)-98.1 F (36.7 C)] 98.1 F (36.7 C) (11/24 1253) Pulse Rate:  [64-130] 130 (11/24 1253) Resp:  [15-24] 16 (11/24 1253) BP: (108-148)/(49-122) 108/55 (11/24 1253) SpO2:  [93 %-100 %] 100 % (11/24 1253) Weight:  [88.9 kg] 88.9 kg (11/24 1025)   General:   Pleasant, cooperative in NAD Head:  Normocephalic and atraumatic. Eyes:   No icterus.   Conjunctiva pale. PERRLA. Ears:  Normal auditory acuity. Neck:  Supple; no masses or thyroidomegaly Lungs: Respirations even and unlabored. Lungs clear to auscultation bilaterally.   No wheezes, crackles, or rhonchi.  Heart:  Regular rate and rhythm;  Without murmur, clicks, rubs or gallops Abdomen:  Soft, nondistended, nontender. Normal bowel sounds. No appreciable masses or hepatomegaly.  No rebound or guarding.  Rectal:  Not performed. Msk:  Symmetrical without gross deformities.    Extremities:  Without edema, cyanosis or clubbing. Neurologic:  Alert and oriented x3;  grossly normal neurologically. Skin:  Intact without significant lesions or rashes. Cervical Nodes:  No significant  cervical adenopathy. Psych:  Alert and cooperative. Normal affect.  LAB RESULTS: Recent Labs    02/28/21 1025  WBC 8.9  HGB 7.2*  HCT 23.3*  PLT 191   BMET Recent Labs    02/28/21 1025  NA 137  K 3.4*  CL 106  CO2 20*  GLUCOSE 134*  BUN 28*  CREATININE 5.70*  CALCIUM 7.7*   LFT Recent Labs    02/28/21 1025  PROT 5.4*  ALBUMIN 2.3*  AST 29  ALT 18  ALKPHOS 43  BILITOT 0.7   PT/INR Recent Labs    02/28/21 1025  LABPROT 18.3*  INR 1.5*    STUDIES: No results found.    Impression / Plan:   Assessment: Principal Problem:   GI bleed Active Problems:   Acute blood loss anemia   Essential hypertension   Symptomatic anemia   Atrial fibrillation with RVR (HCC)   Hypokalemia   COVID-19 virus infection   Hyperglycemia due to  diabetes mellitus ([place])   History of cholecystitis   End-stage renal disease on hemodialysis (HCC)   Reymond Maynez is a 80 y.o. y/o male with who was recently discharged with rectal bleeding with a EGD and colonoscopy not showing a source of his GI bleeding.  The patient had no residual blood in the colon or in his upper GI tract.  The patient states he has had continued bleeding since leaving the hospital and being on his Anticoagulation. He does have some right sided abdominal pain with a drain is.  Patient's liver enzymes are normal  Plan:  The patient has had continued GI bleeding despite any source seen on his EGD and colonoscopy.  I would recommend surgical evaluation for possible treatment of his hemorrhoids versus a GI bleeding study to see if a source of his GI bleeding can be seen with a tag Red cell study.  I do not think repeating an EGD and colonoscopy at this time would be prudent with them being negative 1 week ago for any active bleeding.  I discussed the recommendations with the ER physician.  Dr. Vicente Males will follow up with the patient tomorrow.  Thank you for involving me in the care of this patient.      LOS: 0 days    Gregory Lame, MD, [facility] 02/28/2021, 1:13 PM,  Pager [phone] 7am-5pm  Check AMION for 5pm -7am coverage and on weekends   Note: This dictation was prepared with Dragon dictation along with smaller phrase technology. Any transcriptional errors that result from this process are unintentional.

## 2021-02-28 NOTE — ED Triage Notes (Signed)
Pt here via [facility] with a GI bleed. Pt was bleeding this AM in the toilet, bright red. Pt on eloquis. Hx of a fib. Pt also has a gallbladder drain. Dialysis MWF, left chest permacath.  72/56 110 98% RA 97.6 oral

## 2021-02-28 NOTE — H&P (Signed)
History and Physical  Gregory Dixon OFB:[id number] DOB: Sep 06, 1940 DOA: 02/28/2021  Referring physician: Harvest Dark, MD PCP: Gregory Castle, MD  Patient coming from: Home  Chief Complaint: Rectal bleeding  HPI: Gregory Dixon is a 80 y.o. male with medical history significant for type 2 diabetes mellitus, hypertension, ESRD on HD (MWF), chronic blood loss/GI bleed, MGUS, GERD, cholecystitis s/p IR percutaneous cholecystostomy placement who presents to the emergency department due to weakness and rectal bleeding.  Patient complained of 2-day onset of intermittent rectal bleeding with significant bright red blood per rectum.  Patient was noted to be very weak when he used the restroom this morning  by wife and he required assistance to prevent him from falling. Bright red blood was noted in the commode.  EMS was activated and patient was taken to the ED for further evaluation and management Patient endorsed chronic history of intermittent GI bleed, states that he received blood transfusion 2 weeks ago and also received blood transfusion last week.  Last colonoscopy was on Monday (11/21) by Dr. Allen Norris and there was no significant findings.  He denies fever, chills, chest pain, shortness of breath, nausea or vomiting  ED Course:  In emergency department, temperature was 97.71F, he was tachycardic, BP 148/122, respiration 18/minute and O2 sat was 100% on room air.  Work-up in the ED showed normocytic anemia, H/H 7.2/23.3 (this was 8.0/24.7  on 11/21).,  Hypokalemia, hyperglycemia, BUN/creatinine 28/5.70, calcium 7.7, albumin 3.3, influenza A, B was negative, SARS coronavirus 2 was positive. Type and screen was done and 2 units of blood was recommended for transfusion in the ED due to symptomatic anemia.  Nephrology was consulted and will follow up with patient, gastroenterology was consulted and noted that the rectal bleeding was due to internal hemorrhoids and recommended surgical consultation  per ED physician.  Hospitalist was asked to admit patient for further evaluation and management.  Review of Systems: Constitutional: Positive for generalized weakness.  Negative for chills and fever.  HENT: Negative for ear pain and sore throat.   Eyes: Negative for pain and visual disturbance.  Respiratory: Negative for cough, chest tightness and shortness of breath.   Cardiovascular: Negative for chest pain and palpitations.  Gastrointestinal: Positive for bright red blood per rectum.  Negative for abdominal pain and vomiting.  Endocrine: Negative for polyphagia and polyuria.  Genitourinary: Negative for decreased urine volume, dysuria, enuresis Musculoskeletal: Negative for arthralgias and back pain.  Skin: Negative for color change and rash.  Allergic/Immunologic: Negative for immunocompromised state.  Neurological: Negative for tremors, syncope, speech difficulty, weakness, light-headedness and headaches.  Hematological: Does not bruise/bleed easily.  All other systems reviewed and are negative   Past Medical History:  Diagnosis Date   Actinic keratosis    Basal cell carcinoma 10/21/2017   left lateral neck, excised 02/09/2018   Basal cell carcinoma (BCC) 05/12/2019   right posterior ear, excised 05/31/2019   Blood transfusion without reported diagnosis    Diabetes mellitus without complication [facility])    Dysrhythmia    Past Surgical History:  Procedure Laterality Date   COLONOSCOPY N/A 02/24/2021   Procedure: COLONOSCOPY;  Surgeon: [name], MD;  Location: [facility] ENDOSCOPY;  Service: Endoscopy;  Laterality: N/A;   COLONOSCOPY WITH PROPOFOL N/A 04/29/2019   Procedure: COLONOSCOPY WITH PROPOFOL;  Surgeon: Jonathon Bellows, MD;  Location: [facility] ENDOSCOPY;  Service: Gastroenterology;  Laterality: N/A;   DIALYSIS/PERMA CATHETER INSERTION Bilateral 01/28/2021   Procedure: DIALYSIS/PERMA CATHETER INSERTION;  Surgeon: Algernon Huxley, MD;  Location: [facility]  INVASIVE CV LAB;  Service:  Cardiovascular;  Laterality: Bilateral;   ESOPHAGOGASTRODUODENOSCOPY N/A 02/24/2021   Procedure: ESOPHAGOGASTRODUODENOSCOPY (EGD);  Surgeon: [name], MD;  Location: [facility] ENDOSCOPY;  Service: Endoscopy;  Laterality: N/A;   ESOPHAGOGASTRODUODENOSCOPY (EGD) WITH PROPOFOL N/A 04/29/2019   Procedure: ESOPHAGOGASTRODUODENOSCOPY (EGD) WITH PROPOFOL;  Surgeon: Jonathon Bellows, MD;  Location: [facility] ENDOSCOPY;  Service: Gastroenterology;  Laterality: N/A;   IR PERC CHOLECYSTOSTOMY  01/25/2021    Social History:  reports that he has quit smoking. He has never used smokeless tobacco. He reports that he does not currently use alcohol. He reports that he does not use drugs.   Allergies  Allergen Reactions   Aspirin Other (See Comments)    GI bleed   Codeine Nausea Only and Nausea And Vomiting    Family History  Problem Relation Age of Onset   Hypertension Father      Prior to Admission medications   Medication Sig Start Date End Date Taking? Authorizing Provider  apixaban (ELIQUIS) 2.5 MG TABS tablet Take 1 tablet (2.5 mg total) by mouth 2 (two) times daily. 02/26/21   Elgergawy, Silver Huguenin, MD  ascorbic acid (VITAMIN C) 500 MG tablet Take 1 tablet (500 mg total) by mouth daily. 02/01/21 03/03/21  Val Riles, MD  carvedilol (COREG) 6.25 MG tablet Take 1 tablet (6.25 mg total) by mouth 2 (two) times daily with a meal. 01/31/21 03/02/21  Val Riles, MD  diltiazem (CARDIZEM CD) 360 MG 24 hr capsule Take 1 capsule (360 mg total) by mouth daily. 02/01/21 03/03/21  Val Riles, MD  fenofibrate 160 MG tablet Take 160 mg by mouth at bedtime.    [provider]  ferrous sulfate 325 (65 FE) MG tablet Take 325 mg by mouth 2 (two) times daily with a meal.    [provider]  insulin NPH-regular Human (70-30) 100 UNIT/ML injection Inject 10-30 Units into the skin 2 (two) times daily with a meal.    [provider]  Multiple Vitamin (MULTIVITAMIN) capsule Take 1 capsule by mouth  daily.    [provider]  pantoprazole (PROTONIX) 40 MG tablet Take 1 tablet (40 mg total) by mouth daily. Patient not taking: Reported on 02/13/2021 11/25/20 12/25/20  Loletha Grayer, MD  Vitamin D, Ergocalciferol, (DRISDOL) 1.25 MG (50000 UNIT) CAPS capsule Take 1 capsule (50,000 Units total) by mouth every 7 (seven) days. 02/07/21 05/08/21  Val Riles, MD    Physical Exam: BP (!) 109/55   Pulse (!) 119   Temp (!) 97.5 F (36.4 C) (Oral)   Resp 15   Ht 6\' 6"  (1.981 m)   Wt 88.9 kg   SpO2 96%   BMI 22.65 kg/m   General: 80 y.o. year-old male well developed well nourished in no acute distress.  Alert and oriented x3. HEENT: NCAT, EOMI Neck: Supple, trachea medial Cardiovascular: Regular rate and rhythm with no rubs or gallops.  No thyromegaly or JVD noted.  No lower extremity edema. 2/4 pulses in all 4 extremities. Respiratory: Clear to auscultation with no wheezes or rales. Good inspiratory effort. Abdomen: Soft, noted colostomy bag with brownish fluid.  Nontender and nondistended with normal bowel sounds x4 quadrants. Muskuloskeletal: Right upper anterior PermCath noted.  No cyanosis, clubbing or edema noted bilaterally Neuro: CN II-XII intact, strength 5/5 x 4, sensation, reflexes intact Skin: No ulcerative lesions noted or rashes Psychiatry: Judgement and insight appear normal. Mood is appropriate for condition and setting          Labs on  Admission:  Basic Metabolic Panel: Recent Labs  Lab 02/22/21 0435 02/23/21 0625 02/25/21 0319 02/28/21 1025  NA 137 134* 134* 137  K 3.5 3.4* 3.5 3.4*  CL 104 99 103 106  CO2 24 26 24  20*  GLUCOSE 87 106* 119* 134*  BUN 25* 19 40* 28*  CREATININE 6.99* 5.14* 7.98* 5.70*  CALCIUM 8.0* 7.9* 7.9* 7.7*   Liver Function Tests: Recent Labs  Lab 02/28/21 1025  AST 29  ALT 18  ALKPHOS 43  BILITOT 0.7  PROT 5.4*  ALBUMIN 2.3*   No results for input(s): LIPASE, AMYLASE in the last 168 hours. No results for input(s):  AMMONIA in the last 168 hours. CBC: Recent Labs  Lab 02/22/21 1748 02/23/21 0625 02/23/21 1546 02/24/21 0645 02/25/21 0319 02/28/21 1025  WBC 1.9* 4.5 4.2 3.7* 4.1 8.9  NEUTROABS 1.0*  --   --   --   --   --   HGB 7.5* 7.9* 8.0* 7.4* 8.0* 7.2*  HCT 23.7* 25.0* 24.6* 23.5* 24.7* 23.3*  MCV 87.8 88.3 88.8 88.3 86.4 93.6  PLT 171 167 177 156 153 191   Cardiac Enzymes: No results for input(s): CKTOTAL, CKMB, CKMBINDEX, TROPONINI in the last 168 hours.  BNP (last 3 results) No results for input(s): BNP in the last 8760 hours.  ProBNP (last 3 results) No results for input(s): PROBNP in the last 8760 hours.  CBG: Recent Labs  Lab 02/24/21 1141 02/24/21 1631 02/24/21 2057 02/25/21 0759 02/25/21 1410  GLUCAP 114* 115* 144* 115* 168*    Radiological Exams on Admission: No results found.  EKG: I independently viewed the EKG done and my findings are as followed: A. fib with RVR with prolonged QTc (543 ms)  Assessment/Plan Present on Admission:  GI bleed  Acute blood loss anemia  Symptomatic anemia  Essential hypertension  Principal Problem:   GI bleed Active Problems:   Acute blood loss anemia   Essential hypertension   Symptomatic anemia   Atrial fibrillation with RVR (HCC)   Hypokalemia   COVID-19 virus infection   Hyperglycemia due to diabetes mellitus ([place])   History of cholecystitis   End-stage renal disease on hemodialysis (HCC)   GI bleed Symptomatic anemia secondary to acute blood loss anemia H/H=H/H 7.2/23.3 (this was 8.0/24.7  on 11/21) Type and crossmatch was done 2 units of PRBC was ordered to be transfused in the ED Colonoscopy findings on 02/24/2021:  -Two 3 to 4 mm polyps in the transverse colon, removed with a cold snare. Resected and retrieved. -Large lipoma in the transverse colon. -One 4 mm polyp in the sigmoid colon, removed with a cold snare. Resected and retrieved. Gastroenterologist  was consulted and believed that patient's bleeding  was due to bleeding internal hemorrhoids and recommended surgical consult General surgery will be consulted and we shall await further recommendation  A. fib with RVR Patient with history of A. fib with RVR He was started on IV Cardizem drip in the ED, this was discontinued due to soft BP and patient was started on IV amiodarone drip with plan to transition patient to home Coreg and Cardizem once HR is better controlled Eliquis will be held at this time  Hypokalemia 3.4, this will be replenished  Prolonged QTc (543 ms) Avoid QT prolonging drugs Magnesium level will be checked Repeat EKG in the morning  Hypoalbuminemia secondary to moderate protein calorie malnutrition Albumin 2.3, protein supplement to be provided  Hyperglycemia secondary to type II DM Hemoglobin done on 01/25/2021 was  5.6 Continue diet control  Essential hypertension Home meds will be held at this time due to soft BP  History of cholecystitis s/p percutaneous cholecystostomy drain Continue with drain flushing/care  COVID-19 virus infection This was initially positive on 11/9 and finished quarantine on 11/20  ESRD on HD (MWF) Nephrology will be consulted for maintenance dialysis    DVT prophylaxis: SCDs  Code Status: Full code  Family Communication: None at bedside  Disposition Plan:  Patient is from:                        home Anticipated DC to:                   SNF or family members home Anticipated DC date:               2-3 days Anticipated DC barriers:          Patient requires inpatient management due to symptomatic anemia requiring blood transfusion and pending surgical consult due to bleeding internal hemorrhoids   Consults called: Gastroenterology (by ED physician),Nephrology, general surgery  Admission status: Inpatient    Bernadette Hoit MD [facility]  02/28/2021, 12:50 PM

## 2021-03-01 DIAGNOSIS — I4891 Unspecified atrial fibrillation: Secondary | ICD-10-CM

## 2021-03-01 LAB — COMPREHENSIVE METABOLIC PANEL
ALT: 15 U/L (ref 0–44)
AST: 15 U/L (ref 15–41)
Albumin: 2.2 g/dL — ABNORMAL LOW (ref 3.5–5.0)
Alkaline Phosphatase: 34 U/L — ABNORMAL LOW (ref 38–126)
Anion gap: 6 (ref 5–15)
BUN: 36 mg/dL — ABNORMAL HIGH (ref 8–23)
CO2: 27 mmol/L (ref 22–32)
Calcium: 7.7 mg/dL — ABNORMAL LOW (ref 8.9–10.3)
Chloride: 106 mmol/L (ref 98–111)
Creatinine, Ser: 6.66 mg/dL — ABNORMAL HIGH (ref 0.61–1.24)
GFR, Estimated: 8 mL/min — ABNORMAL LOW (ref >60)
Glucose, Bld: 105 mg/dL — ABNORMAL HIGH (ref 70–99)
Potassium: 3.6 mmol/L (ref 3.5–5.1)
Sodium: 139 mmol/L (ref 135–145)
Total Bilirubin: 0.7 mg/dL (ref 0.3–1.2)
Total Protein: 4.9 g/dL — ABNORMAL LOW (ref 6.5–8.1)

## 2021-03-01 LAB — CBC
HCT: 21.5 % — ABNORMAL LOW (ref 39.0–52.0)
HCT: 23.2 % — ABNORMAL LOW (ref 39.0–52.0)
Hemoglobin: 7 g/dL — ABNORMAL LOW (ref 13.0–17.0)
Hemoglobin: 7.7 g/dL — ABNORMAL LOW (ref 13.0–17.0)
MCH: 28.9 pg (ref 26.0–34.0)
MCH: 29.5 pg (ref 26.0–34.0)
MCHC: 32.6 g/dL (ref 30.0–36.0)
MCHC: 33.2 g/dL (ref 30.0–36.0)
MCV: 88.8 fL (ref 80.0–100.0)
MCV: 88.9 fL (ref 80.0–100.0)
Platelets: 151 10*3/uL (ref 150–400)
Platelets: 151 10*3/uL (ref 150–400)
RBC: 2.42 MIL/uL — ABNORMAL LOW (ref 4.22–5.81)
RBC: 2.61 MIL/uL — ABNORMAL LOW (ref 4.22–5.81)
RDW: 14.7 % (ref 11.5–15.5)
RDW: 14.7 % (ref 11.5–15.5)
WBC: 5.1 10*3/uL (ref 4.0–10.5)
WBC: 4.7 10*3/uL (ref 4.0–10.5)
nRBC: 0 % (ref 0.0–0.2)
nRBC: 0 % (ref 0.0–0.2)

## 2021-03-01 LAB — HEPATITIS B SURFACE ANTIGEN: Hepatitis B Surface Ag: NONREACTIVE

## 2021-03-01 LAB — MAGNESIUM: Magnesium: 1.7 mg/dL (ref 1.7–2.4)

## 2021-03-01 LAB — GLUCOSE, CAPILLARY: Glucose-Capillary: 182 mg/dL — ABNORMAL HIGH (ref 70–99)

## 2021-03-01 LAB — HEPATITIS B SURFACE ANTIBODY,QUALITATIVE: Hep B S Ab: NONREACTIVE

## 2021-03-01 LAB — PHOSPHORUS: Phosphorus: 4.4 mg/dL (ref 2.5–4.6)

## 2021-03-01 MED ORDER — SODIUM CHLORIDE 0.9 % IV SOLN: 100.0000 mL | INTRAVENOUS | Status: DC | PRN

## 2021-03-01 MED ORDER — CHLORHEXIDINE GLUCONATE CLOTH 2 % EX PADS
6.0000 | MEDICATED_PAD | Freq: Every day | CUTANEOUS | Status: DC
  Administered 2021-03-01 – 2021-03-06 (×5): 6 via TOPICAL

## 2021-03-01 MED ORDER — PENTAFLUOROPROP-TETRAFLUOROETH EX AERO: 1.0000 "application " | INHALATION_SPRAY | CUTANEOUS | Status: DC | PRN

## 2021-03-01 MED ORDER — ADULT MULTIVITAMIN W/MINERALS CH
1.0000 | ORAL_TABLET | Freq: Every day | ORAL | Status: DC
  Administered 2021-03-02 – 2021-03-05 (×4): 1 via ORAL
  Filled 2021-03-01 (×4): qty 1

## 2021-03-01 MED ORDER — RENA-VITE PO TABS
1.0000 | ORAL_TABLET | Freq: Every day | ORAL | Status: DC
  Administered 2021-03-01 – 2021-03-05 (×5): 1 via ORAL
  Filled 2021-03-01 (×6): qty 1

## 2021-03-01 MED ORDER — SODIUM CHLORIDE 0.9% IV SOLUTION: Freq: Once | INTRAVENOUS | Status: AC

## 2021-03-01 MED ORDER — BOOST / RESOURCE BREEZE PO LIQD CUSTOM
1.0000 | Freq: Three times a day (TID) | ORAL | Status: DC
  Administered 2021-03-01 (×2): 1 via ORAL

## 2021-03-01 MED ORDER — LIDOCAINE HCL (PF) 1 % IJ SOLN
5.0000 mL | INTRAMUSCULAR | Status: DC | PRN
  Filled 2021-03-01: qty 5

## 2021-03-01 MED ORDER — ASCORBIC ACID 500 MG PO TABS
500.0000 mg | ORAL_TABLET | Freq: Two times a day (BID) | ORAL | Status: DC
  Administered 2021-03-01 – 2021-03-05 (×9): 500 mg via ORAL
  Filled 2021-03-01 (×9): qty 1

## 2021-03-01 MED ORDER — ALTEPLASE 2 MG IJ SOLR: 2.0000 mg | Freq: Once | INTRAMUSCULAR | Status: DC | PRN

## 2021-03-01 MED ORDER — LIDOCAINE-PRILOCAINE 2.5-2.5 % EX CREA: 1.0000 "application " | TOPICAL_CREAM | CUTANEOUS | Status: DC | PRN

## 2021-03-01 MED ORDER — HEPARIN SODIUM (PORCINE) 1000 UNIT/ML DIALYSIS: 1000.0000 [IU] | INTRAMUSCULAR | Status: DC | PRN

## 2021-03-01 MED ORDER — CARVEDILOL 6.25 MG PO TABS
6.2500 mg | ORAL_TABLET | Freq: Two times a day (BID) | ORAL | Status: DC
  Administered 2021-03-01 – 2021-03-05 (×8): 6.25 mg via ORAL
  Filled 2021-03-01 (×8): qty 1

## 2021-03-01 MED ORDER — PROSOURCE TF PO LIQD
45.0000 mL | Freq: Three times a day (TID) | ORAL | Status: DC
  Administered 2021-03-01 (×2): 45 mL
  Filled 2021-03-01 (×11): qty 45

## 2021-03-01 MED ORDER — HEPARIN SODIUM (PORCINE) 1000 UNIT/ML IJ SOLN
INTRAMUSCULAR | Status: AC
  Filled 2021-03-01: qty 10

## 2021-03-01 NOTE — Progress Notes (Signed)
Patient completed 3hours of dialysis with no fluid removal. Patient heartrate would go into 130s, but not sustaining, hospitalist made aware at bedside(per hospitalist cardiac meds would be reordered due to blood pressures being stable at this time. Patient with no complaints, able to stand for weight post treatment.CVC dressing changed.

## 2021-03-01 NOTE — Progress Notes (Signed)
Initial Nutrition Assessment  DOCUMENTATION CODES:   Severe malnutrition in context of chronic illness  INTERVENTION:   Boost Breeze po TID, each supplement provides 250 kcal and 9 grams of protein  Pro-Source 42ml TID via tube, provides 40kcal and 11g of protein per serving   MVI po daily   Rena-vit po daily   Vitamin C 500mg  po BID  Pt at high refeed risk; recommend monitor potassium, magnesium and phosphorus labs daily until stable  NUTRITION DIAGNOSIS:   Severe Malnutrition related to chronic illness (ESRD on HD) as evidenced by severe fat depletion, severe muscle depletion, 20 percent weight loss in months.  GOAL:   Patient will meet greater than or equal to 90% of their needs  MONITOR:   PO intake, Supplement acceptance, Labs, Weight trends, Skin, I & O's  REASON FOR ASSESSMENT:   Malnutrition Screening Tool    ASSESSMENT:   80 y/o male with h/o ESRD on HD, HTN, Afib, DM, MGUS, cholecystitis status post IR percutaneous cholecystostomy placement 10/21 and recent admission for COVID 19 and GIB s/p EGD/colonscopy who is now admitted with recurrent GIB.  Pt s/p EGD/colonoscopy 11/20; noted to have hemorrhoids, polyps, lipoma and hiatal hernia  Unable to see pt as pt in HD at time of RD visit. Pt is known to this RD from his recent previous admit. Pt reports poor appetite and oral intake for several months pta r/t taste changes. Pt reports that food tastes so bad that he can barely swallow it; pt also reports that he has been unable to drink any supplements. Pt reports a 48lb weight loss over the past month. Per chart, pt is down 50lbs(20%) since September; this is severe weight loss. Pt has lost 5lbs(3%) since his last admission. Pt taking ProSource Plus and Boost Breeze during his last admit. RD will add supplements and vitamins to help pt meet his estimated needs and to replace losses from HD. Pt admitted with GI bleed and reports bruising easily; RD will add vitamin C  as pt at increased risk for scurvy. RD will obtain nutrition related history and exam at follow-up.   Medications reviewed and include:   Labs reviewed: K 3.6 wnl, BUN 36(H), creat 6.66(H) Hgb 7.0(L), Hct 21.5(L) AIC 5.6 wnl- 10/21  NUTRITION - FOCUSED PHYSICAL EXAM: from 11/18  Flowsheet Row Most Recent Value  Orbital Region Moderate depletion  Upper Arm Region Severe depletion  Thoracic and Lumbar Region Severe depletion  Buccal Region Moderate depletion  Temple Region Moderate depletion  Clavicle Bone Region Severe depletion  Clavicle and Acromion Bone Region Severe depletion  Scapular Bone Region Moderate depletion  Dorsal Hand Severe depletion  Patellar Region Severe depletion  Anterior Thigh Region Severe depletion  Posterior Calf Region Severe depletion  Edema (RD Assessment) Mild  Hair Reviewed  Eyes Reviewed  Mouth Reviewed  Skin Reviewed  Nails Reviewed   Diet Order:   Diet Order             Diet regular Room service appropriate? Yes; Fluid consistency: Thin  Diet effective now                  EDUCATION NEEDS:   Education needs have been addressed  Skin:  Skin Assessment: Reviewed RN Assessment (ecchymosis)  Last BM:  11/24- TYPE 7  Height:   Ht Readings from Last 1 Encounters:  02/28/21 6\' 6"  (1.981 m)    Weight:   Wt Readings from Last 1 Encounters:  03/01/21 91.1 kg  Ideal Body Weight:  97 kg  BMI:  Body mass index is 23.21 kg/m.  Estimated Nutritional Needs:   Kcal:  2500-2800kcal/day  Protein:  >125g/day  Fluid:  2.3-2.6L/day  Koleen Distance MS, RD, LDN Please refer to [facility] for RD and/or RD on-call/weekend/after hours pager

## 2021-03-01 NOTE — Progress Notes (Signed)
PROGRESS NOTE    Gregory Dixon  XMI:[id number] DOB: Aug 07, 1940 DOA: 02/28/2021 PCP: Valera Castle, MD  [phone]   Assessment & Plan:   Principal Problem:   GI bleed Active Problems:   Acute blood loss anemia   Essential hypertension   Symptomatic anemia   Atrial fibrillation with RVR (HCC)   Hypokalemia   COVID-19 virus infection   Hyperglycemia due to diabetes mellitus ([place])   History of cholecystitis   End-stage renal disease on hemodialysis (HCC)   Gregory Dixon is a 80 y.o. male with medical history significant for type 2 diabetes mellitus, hypertension, ESRD on HD (MWF), chronic blood loss/GI bleed, MGUS, GERD, cholecystitis s/p IR percutaneous cholecystostomy placement who presents to the emergency department due to weakness and rectal bleeding.  Patient complained of 2-day onset of intermittent rectal bleeding with significant bright red blood per rectum.  Patient was noted to be very weak when he used the restroom this morning  by wife and he required assistance to prevent him from falling. Bright red blood was noted in the commode.  EMS was activated and patient was taken to the ED for further evaluation and management Patient endorsed chronic history of intermittent GI bleed, states that he received blood transfusion 2 weeks ago and also received blood transfusion last week.  Last colonoscopy was on Monday (11/21) by Dr. Allen Norris and there was no significant findings.    GI bleed 2/2 internal hemorrhoids in the setting of Eliquis Symptomatic anemia secondary to acute blood loss anemia --Hgb 7.0 this morning after 2u pRBC --fresh blood seen on the wipes and dripping after BM's, but no blood with stool. Plan: --3rd unit of pRBC today --monitor Hgb and transfuse to keep Hgb >7 --hold home Eliquis --No need for further GI scoping, per GI --GenSurg consult for banding of hemorrhoids  A. fib with RVR He was started on IV Cardizem drip in the ED, this was  discontinued due to soft BP.   --resume home coreg --hold home cardizem for now  --hold home Eliquis due to GI bleed  Hypokalemia --monitor and replete PRN   Prolonged QTc (543 ms) Avoid QT prolonging drugs   Hypoalbuminemia  Severe malnutrition in context of chronic illness Albumin 2.3 --supplements per dietician   Hx of type II DM, not currently active --A1c 5.6  Essential hypertension --BP has been low --resume home coreg --hold home cardizem for now   History of cholecystitis s/p percutaneous cholecystostomy drain Continue with drain flushing/care   COVID-19 virus infection This was initially positive on 11/9 and finished quarantine on 11/20  ESRD on HD (MWF) --iHD per nephrology    DVT prophylaxis: SCD/Compression stockings Code Status: DNR  Family Communication:  Level of care: Telemetry Medical Dispo:   The patient is from: home Anticipated d/c is to: home Anticipated d/c date is: 1-2 days Patient currently is not medically ready to d/c due to: pending surgery consult   Subjective and Interval History:  Pt reported 2 episodes of blood per rectum, after having BM, with fresh blood all on the wipes but no with stool.    Hgb up to 7.7 after 3u pRBC.   Objective: Vitals:   03/01/21 1600 03/01/21 1615 03/01/21 1628 03/01/21 1723  BP: 127/67 117/85 135/89 138/62  Pulse: 91 (!) 57 (!) 111 62  Resp: 17 19 20 20   Temp:   98.7 F (37.1 C) 97.6 F (36.4 C)  TempSrc:   Oral Oral  SpO2: 99% 100% 99% 100%  Weight:   90.7 kg   Height:        Intake/Output Summary (Last 24 hours) at 03/01/2021 1950 Last data filed at 03/01/2021 1736 Gross per 24 hour  Intake 890 ml  Output 667 ml  Net 223 ml   Filed Weights   02/28/21 1025 03/01/21 1306 03/01/21 1628  Weight: 88.9 kg 91.1 kg 90.7 kg    Examination:   Constitutional: NAD, AAOx3 HEENT: conjunctivae and lids normal, EOMI CV: No cyanosis.   RESP: normal respiratory effort, on RA Extremities: No  effusions, edema in BLE SKIN: warm, dry Neuro: II - XII grossly intact.   Psych: Normal mood and affect.  Appropriate judgement and reason   Data Reviewed: I have personally reviewed following labs and imaging studies  CBC: Recent Labs  Lab 02/24/21 0645 02/25/21 0319 02/28/21 1025 03/01/21 0439 03/01/21 1410  WBC 3.7* 4.1 8.9 5.1 4.7  HGB 7.4* 8.0* 7.2* 7.0* 7.7*  HCT 23.5* 24.7* 23.3* 21.5* 23.2*  MCV 88.3 86.4 93.6 88.8 88.9  PLT 156 153 191 151 735   Basic Metabolic Panel: Recent Labs  Lab 02/23/21 0625 02/25/21 0319 02/28/21 1025 03/01/21 0439  NA 134* 134* 137 139  K 3.4* 3.5 3.4* 3.6  CL 99 103 106 106  CO2 26 24 20* 27  GLUCOSE 106* 119* 134* 105*  BUN 19 40* 28* 36*  CREATININE 5.14* 7.98* 5.70* 6.66*  CALCIUM 7.9* 7.9* 7.7* 7.7*  MG  --   --  2.0 1.7  PHOS  --   --   --  4.4   GFR: Estimated Creatinine Clearance: 11.5 mL/min (A) (by C-G formula based on SCr of 6.66 mg/dL (H)). Liver Function Tests: Recent Labs  Lab 02/28/21 1025 03/01/21 0439  AST 29 15  ALT 18 15  ALKPHOS 43 34*  BILITOT 0.7 0.7  PROT 5.4* 4.9*  ALBUMIN 2.3* 2.2*   No results for input(s): LIPASE, AMYLASE in the last 168 hours. No results for input(s): AMMONIA in the last 168 hours. Coagulation Profile: Recent Labs  Lab 02/28/21 1025  INR 1.5*   Cardiac Enzymes: No results for input(s): CKTOTAL, CKMB, CKMBINDEX, TROPONINI in the last 168 hours. BNP (last 3 results) No results for input(s): PROBNP in the last 8760 hours. HbA1C: No results for input(s): HGBA1C in the last 72 hours. CBG: Recent Labs  Lab 02/24/21 1141 02/24/21 1631 02/24/21 2057 02/25/21 0759 02/25/21 1410  GLUCAP 114* 115* 144* 115* 168*   Lipid Profile: No results for input(s): CHOL, HDL, LDLCALC, TRIG, CHOLHDL, LDLDIRECT in the last 72 hours. Thyroid Function Tests: No results for input(s): TSH, T4TOTAL, FREET4, T3FREE, THYROIDAB in the last 72 hours. Anemia Panel: No results for input(s):  VITAMINB12, FOLATE, FERRITIN, TIBC, IRON, RETICCTPCT in the last 72 hours. Sepsis Labs: No results for input(s): PROCALCITON, LATICACIDVEN in the last 168 hours.  Recent Results (from the past 240 hour(s))  Resp Panel by RT-PCR (Flu A&B, Covid) Nasopharyngeal Swab     Status: Abnormal   Collection Time: 02/20/21  8:22 PM   Specimen: Nasopharyngeal Swab; Nasopharyngeal(NP) swabs in vial transport medium  Result Value Ref Range Status   SARS Coronavirus 2 by RT PCR POSITIVE (A) NEGATIVE Final    Comment: RESULT CALLED TO, READ BACK BY AND VERIFIED WITH: WHITNEY HICKS @2203  ON 02/20/21 (NOTE) SARS-CoV-2 target nucleic acids are DETECTED.  The SARS-CoV-2 RNA is generally detectable in upper respiratory specimens during the acute phase of infection. Positive results are indicative of the presence of  the identified virus, but do not rule out bacterial infection or co-infection with other pathogens not detected by the test. Clinical correlation with patient history and other diagnostic information is necessary to determine patient infection status. The expected result is Negative.  Fact Sheet for Patients: EntrepreneurPulse.com.au  Fact Sheet for Healthcare Providers: IncredibleEmployment.be  This test is not yet approved or cleared by the [place] FDA and  has been authorized for detection and/or diagnosis of SARS-CoV-2 by FDA under an Emergency Use Authorization (EUA).  This EUA will remain in effect (meaning this test can be u sed) for the duration of  the COVID-19 declaration under Section 564(b)(1) of the Act, 21 U.S.C. section 360bbb-3(b)(1), unless the authorization is terminated or revoked sooner.     Influenza A by PCR NEGATIVE NEGATIVE Final   Influenza B by PCR NEGATIVE NEGATIVE Final    Comment: (NOTE) The Xpert Xpress SARS-CoV-2/FLU/RSV plus assay is intended as an aid in the diagnosis of influenza from Nasopharyngeal swab  specimens and should not be used as a sole basis for treatment. Nasal washings and aspirates are unacceptable for Xpert Xpress SARS-CoV-2/FLU/RSV testing.  Fact Sheet for Patients: EntrepreneurPulse.com.au  Fact Sheet for Healthcare Providers: IncredibleEmployment.be  This test is not yet approved or cleared by the [place] FDA and has been authorized for detection and/or diagnosis of SARS-CoV-2 by FDA under an Emergency Use Authorization (EUA). This EUA will remain in effect (meaning this test can be used) for the duration of the COVID-19 declaration under Section 564(b)(1) of the Act, 21 U.S.C. section 360bbb-3(b)(1), unless the authorization is terminated or revoked.  Performed at [facility], [address] [place], [place] [address]   Resp Panel by RT-PCR (Flu A&B, Covid) Nasopharyngeal Swab     Status: Abnormal   Collection Time: 02/28/21 10:25 AM   Specimen: Nasopharyngeal Swab; Nasopharyngeal(NP) swabs in vial transport medium  Result Value Ref Range Status   SARS Coronavirus 2 by RT PCR POSITIVE (A) NEGATIVE Final    Comment: RESULT CALLED TO, READ BACK BY AND VERIFIED WITH: MORGAN CATES RN @1142  02/28/21 SCS (NOTE) SARS-CoV-2 target nucleic acids are DETECTED.  The SARS-CoV-2 RNA is generally detectable in upper respiratory specimens during the acute phase of infection. Positive results are indicative of the presence of the identified virus, but do not rule out bacterial infection or co-infection with other pathogens not detected by the test. Clinical correlation with patient history and other diagnostic information is necessary to determine patient infection status. The expected result is Negative.  Fact Sheet for Patients: EntrepreneurPulse.com.au  Fact Sheet for Healthcare Providers: IncredibleEmployment.be  This test is not yet approved or cleared by the [place] FDA  and  has been authorized for detection and/or diagnosis of SARS-CoV-2 by FDA under an Emergency Use Authorization (EUA).  This EUA will remain in effect (meaning this test can b e used) for the duration of  the COVID-19 declaration under Section 564(b)(1) of the Act, 21 U.S.C. section 360bbb-3(b)(1), unless the authorization is terminated or revoked sooner.     Influenza A by PCR NEGATIVE NEGATIVE Final   Influenza B by PCR NEGATIVE NEGATIVE Final    Comment: (NOTE) The Xpert Xpress SARS-CoV-2/FLU/RSV plus assay is intended as an aid in the diagnosis of influenza from Nasopharyngeal swab specimens and should not be used as a sole basis for treatment. Nasal washings and aspirates are unacceptable for Xpert Xpress SARS-CoV-2/FLU/RSV testing.  Fact Sheet for Patients: EntrepreneurPulse.com.au  Fact Sheet for Healthcare Providers:  IncredibleEmployment.be  This test is not yet approved or cleared by the [place] and has been authorized for detection and/or diagnosis of SARS-CoV-2 by FDA under an Emergency Use Authorization (EUA). This EUA will remain in effect (meaning this test can be used) for the duration of the COVID-19 declaration under Section 564(b)(1) of the Act, 21 U.S.C. section 360bbb-3(b)(1), unless the authorization is terminated or revoked.  Performed at [facility], [address], [place], [place] [address]       Radiology Studies: No results found.   Scheduled Meds:  heparin sodium (porcine)       vitamin C  500 mg Oral BID   carvedilol  6.25 mg Oral BID WC   Chlorhexidine Gluconate Cloth  6 each Topical Daily   feeding supplement  1 Container Oral TID BM   feeding supplement (PROSource TF)  45 mL Per Tube TID   multivitamin  1 tablet Oral QHS   [START ON 03/02/2021] multivitamin with minerals  1 tablet Oral Daily   Continuous Infusions:  sodium chloride Stopped (02/28/21 1139)   sodium chloride  Stopped (02/28/21 1609)     LOS: 1 day     Enzo Bi, MD [facility] If 7PM-7AM, please contact night-coverage 03/01/2021, 7:50 PM

## 2021-03-01 NOTE — Progress Notes (Signed)
[facility]  ROUNDING NOTE   Subjective:   Gregory Dixon is a 80 year old male with past medical conditions including hypertension, diabetes, MGUS, GERD, GI bleed, cholecystitis with percutaneous cholecystostomy placement, ESRD on dialysis. He presents to the ED with bright red rectal bleeding for 2 days. He has been admitted for Weakness [R53.1] GI bleed [K92.2] Atrial fibrillation with normal ventricular rate (HCC) [I48.91] Symptomatic anemia [D64.9] Gastrointestinal hemorrhage, unspecified gastrointestinal hemorrhage type [K92.2]  Patient is known to our practice for multiple admission and received outpatient dialysis treatments at [facility] on MWF schedule, supervised by Dr Candiss Norse.  Patient has recently been admitted for the same complaint, receiving blood transfusions during that admission.  Patient and spouse report black braided blood in commode morning of admission.  Patient also complained of weakness.  Continues to report weakness.  Colonoscopy completed on 11/21 by Dr. Verl Blalock shows no significant findings.  Patient denies shortness of breath, nausea, and vomiting.  Patient and spouse denies fever and chills.  Patient states he has not missed any dialysis treatments recently.  Last dialysis treatment received on Wednesday.  Hemoglobin on arrival 7.2 with elevated BP of 148/122.  Influenza a and B both negative.  COVID-19 positive, but this has remained positive since initial testing on 02/13/21  We have been consulted to manage dialysis needs during this admission.  Objective:  Vital signs in last 24 hours:  Temp:  [97.6 F (36.4 C)-98.6 F (37 C)] 98.6 F (37 C) (11/25 1140) Pulse Rate:  [65-142] 99 (11/25 1140) Resp:  [14-22] 18 (11/25 1140) BP: (88-127)/(50-73) 117/69 (11/25 1140) SpO2:  [96 %-100 %] 100 % (11/25 1140)  Weight change:  Filed Weights   02/28/21 1025  Weight: 88.9 kg    Intake/Output: I/O last 3 completed shifts: In: 1491.1 [I.V.:99.6;  Blood:391.5; IV Piggyback:1000] Out: 80 [Drains:80]   Intake/Output this shift:  Total I/O In: 650 [P.O.:240; Blood:410] Out: -   Physical Exam: General: NAD, resting in bed  Head: Normocephalic, atraumatic. Moist oral mucosal membranes  Eyes: Anicteric  Lungs:  Clear to auscultation, normal effort  Heart: Regular rate and rhythm  Abdomen:  Soft, nontender,   Extremities: Trace peripheral edema.  Neurologic: Alert, moving all four extremities  Skin: No lesions  Access: Right IJ PermCath    Basic Metabolic Panel: Recent Labs  Lab 02/23/21 0625 02/25/21 0319 02/28/21 1025 03/01/21 0439  NA 134* 134* 137 139  K 3.4* 3.5 3.4* 3.6  CL 99 103 106 106  CO2 26 24 20* 27  GLUCOSE 106* 119* 134* 105*  BUN 19 40* 28* 36*  CREATININE 5.14* 7.98* 5.70* 6.66*  CALCIUM 7.9* 7.9* 7.7* 7.7*  MG  --   --  2.0 1.7  PHOS  --   --   --  4.4    Liver Function Tests: Recent Labs  Lab 02/28/21 1025 03/01/21 0439  AST 29 15  ALT 18 15  ALKPHOS 43 34*  BILITOT 0.7 0.7  PROT 5.4* 4.9*  ALBUMIN 2.3* 2.2*   No results for input(s): LIPASE, AMYLASE in the last 168 hours. No results for input(s): AMMONIA in the last 168 hours.  CBC: Recent Labs  Lab 02/22/21 1748 02/23/21 0625 02/23/21 1546 02/24/21 0645 02/25/21 0319 02/28/21 1025 03/01/21 0439  WBC 1.9*   < > 4.2 3.7* 4.1 8.9 5.1  NEUTROABS 1.0*  --   --   --   --   --   --   HGB 7.5*   < >  8.0* 7.4* 8.0* 7.2* 7.0*  HCT 23.7*   < > 24.6* 23.5* 24.7* 23.3* 21.5*  MCV 87.8   < > 88.8 88.3 86.4 93.6 88.8  PLT 171   < > 177 156 153 191 151   < > = values in this interval not displayed.    Cardiac Enzymes: No results for input(s): CKTOTAL, CKMB, CKMBINDEX, TROPONINI in the last 168 hours.  BNP: Invalid input(s): POCBNP  CBG: Recent Labs  Lab 02/24/21 1141 02/24/21 1631 02/24/21 2057 02/25/21 0759 02/25/21 1410  GLUCAP 114* 115* 144* 115* 168*    Microbiology: Results for orders placed or performed during the  hospital encounter of 02/28/21  Resp Panel by RT-PCR (Flu A&B, Covid) Nasopharyngeal Swab     Status: Abnormal   Collection Time: 02/28/21 10:25 AM   Specimen: Nasopharyngeal Swab; Nasopharyngeal(NP) swabs in vial transport medium  Result Value Ref Range Status   SARS Coronavirus 2 by RT PCR POSITIVE (A) NEGATIVE Final    Comment: RESULT CALLED TO, READ BACK BY AND VERIFIED WITH: MORGAN CATES RN @1142  02/28/21 SCS (NOTE) SARS-CoV-2 target nucleic acids are DETECTED.  The SARS-CoV-2 RNA is generally detectable in upper respiratory specimens during the acute phase of infection. Positive results are indicative of the presence of the identified virus, but do not rule out bacterial infection or co-infection with other pathogens not detected by the test. Clinical correlation with patient history and other diagnostic information is necessary to determine patient infection status. The expected result is Negative.  Fact Sheet for Patients: EntrepreneurPulse.com.au  Fact Sheet for Healthcare Providers: IncredibleEmployment.be  This test is not yet approved or cleared by the [place] FDA and  has been authorized for detection and/or diagnosis of SARS-CoV-2 by FDA under an Emergency Use Authorization (EUA).  This EUA will remain in effect (meaning this test can b e used) for the duration of  the COVID-19 declaration under Section 564(b)(1) of the Act, 21 U.S.C. section 360bbb-3(b)(1), unless the authorization is terminated or revoked sooner.     Influenza A by PCR NEGATIVE NEGATIVE Final   Influenza B by PCR NEGATIVE NEGATIVE Final    Comment: (NOTE) The Xpert Xpress SARS-CoV-2/FLU/RSV plus assay is intended as an aid in the diagnosis of influenza from Nasopharyngeal swab specimens and should not be used as a sole basis for treatment. Nasal washings and aspirates are unacceptable for Xpert Xpress SARS-CoV-2/FLU/RSV testing.  Fact Sheet for  Patients: EntrepreneurPulse.com.au  Fact Sheet for Healthcare Providers: IncredibleEmployment.be  This test is not yet approved or cleared by the [place] FDA and has been authorized for detection and/or diagnosis of SARS-CoV-2 by FDA under an Emergency Use Authorization (EUA). This EUA will remain in effect (meaning this test can be used) for the duration of the COVID-19 declaration under Section 564(b)(1) of the Act, 21 U.S.C. section 360bbb-3(b)(1), unless the authorization is terminated or revoked.  Performed at [facility], [address] [place], [place] [address]     Coagulation Studies: Recent Labs    02/28/21 1025  LABPROT 18.3*  INR 1.5*    Urinalysis: No results for input(s): COLORURINE, LABSPEC, PHURINE, GLUCOSEU, HGBUR, BILIRUBINUR, KETONESUR, PROTEINUR, UROBILINOGEN, NITRITE, LEUKOCYTESUR in the last 72 hours.  Invalid input(s): APPERANCEUR    Imaging: No results found.   Medications:    sodium chloride Stopped (02/28/21 1139)   sodium chloride     sodium chloride     sodium chloride Stopped (02/28/21 1609)    Chlorhexidine Gluconate Cloth  6 each  Topical Daily   feeding supplement (GLUCERNA SHAKE)  237 mL Oral TID BM   sodium chloride, sodium chloride, alteplase, heparin, lidocaine (PF), lidocaine-prilocaine, pentafluoroprop-tetrafluoroeth  Assessment/ Plan:  [name] is a 80 y.o.  male with past medical conditions including hypertension, diabetes, MGUS, GERD, GI bleed, cholecystitis with percutaneous cholecystostomy placement, ESRD on dialysis. He presents to the ED with bright red rectal bleeding for 2 days. He has been admitted for Weakness [R53.1] GI bleed [K92.2] Atrial fibrillation with normal ventricular rate (HCC) [I48.91] Symptomatic anemia [D64.9] Gastrointestinal hemorrhage, unspecified gastrointestinal hemorrhage type [K92.2]   [facility] [place]/MWF/Rt  Permcath  End-stage renal disease requiring hemodialysis.  Will maintain outpatient scheduling if possible.  We will schedule patient to receive dialysis today.  UF goal 0.5 to 1 L.  2. Anemia of chronic kidney disease  Lab Results  Component Value Date   HGB 7.0 (L) 03/01/2021    Hemoglobin below target.  Patient has received 3 units of blood thus far this admission.  Mircera and Venofer are given outpatient. Recent complaints of GI bleed.  GI will defer to surgery for evaluation of hemorrhoids.  3. Secondary Hyperparathyroidism: with outpatient labs: phosphorus 5.1, corrected calcium 8.7 on 02/18/21.   Lab Results  Component Value Date   CALCIUM 7.7 (L) 03/01/2021   PHOS 4.4 03/01/2021    Currently receives vitamin D outpatient.  Phosphorus at goal with calcium slightly decreased.  We will continue to monitor bone minerals during this admission  4. Diabetes mellitus type II with chronic kidney disease: insulin dependent. Home regimen includes NPH. Most recent hemoglobin A1c is 5.6 on 01/25/21.  Maintaining glucose control  5.  Hypertension with chronic kidney disease.  Home regimen includes carvedilol and diltiazem.  Currently not prescribed at this time.  BP stable, will continue to monitor.    LOS: 1 Jesyca Weisenburger 11/25/202212:05 PM

## 2021-03-01 NOTE — Progress Notes (Signed)
Rounding hospitalist bedside, made aware of pt's HR at 130s. Hospitalist stated that his cardiac meds will be added back to his MARs. Pt is asymptomatic. Uf off.

## 2021-03-02 ENCOUNTER — Encounter
Admission: EM | Disposition: A | Discharge status: Home / Self Care | Payer: Self-pay | Source: Home / Self Care | Attending: Hospitalist

## 2021-03-02 ENCOUNTER — Inpatient Hospital Stay: Payer: Medicare Other | Admitting: Certified Registered"

## 2021-03-02 DIAGNOSIS — K643 Fourth degree hemorrhoids: Secondary | ICD-10-CM | POA: Diagnosis not present

## 2021-03-02 DIAGNOSIS — K625 Hemorrhage of anus and rectum: Secondary | ICD-10-CM

## 2021-03-02 HISTORY — PX: HEMORRHOID SURGERY: SHX153

## 2021-03-02 LAB — CBC
HCT: 22.8 % — ABNORMAL LOW (ref 39.0–52.0)
Hemoglobin: 7.6 g/dL — ABNORMAL LOW (ref 13.0–17.0)
MCH: 29.6 pg (ref 26.0–34.0)
MCHC: 33.3 g/dL (ref 30.0–36.0)
MCV: 88.7 fL (ref 80.0–100.0)
Platelets: 141 10*3/uL — ABNORMAL LOW (ref 150–400)
RBC: 2.57 MIL/uL — ABNORMAL LOW (ref 4.22–5.81)
RDW: 14.6 % (ref 11.5–15.5)
WBC: 3.9 10*3/uL — ABNORMAL LOW (ref 4.0–10.5)
nRBC: 0 % (ref 0.0–0.2)

## 2021-03-02 LAB — GLUCOSE, CAPILLARY
Glucose-Capillary: 97 mg/dL (ref 70–99)
Glucose-Capillary: 97 mg/dL (ref 70–99)

## 2021-03-02 LAB — BASIC METABOLIC PANEL
Anion gap: 4 — ABNORMAL LOW (ref 5–15)
BUN: 24 mg/dL — ABNORMAL HIGH (ref 8–23)
CO2: 29 mmol/L (ref 22–32)
Calcium: 7.5 mg/dL — ABNORMAL LOW (ref 8.9–10.3)
Chloride: 103 mmol/L (ref 98–111)
Creatinine, Ser: 4.38 mg/dL — ABNORMAL HIGH (ref 0.61–1.24)
GFR, Estimated: 13 mL/min — ABNORMAL LOW (ref >60)
Glucose, Bld: 116 mg/dL — ABNORMAL HIGH (ref 70–99)
Potassium: 3.5 mmol/L (ref 3.5–5.1)
Sodium: 136 mmol/L (ref 135–145)

## 2021-03-02 LAB — MAGNESIUM: Magnesium: 1.8 mg/dL (ref 1.7–2.4)

## 2021-03-02 LAB — HEPATITIS B SURFACE ANTIBODY, QUANTITATIVE: Hep B S AB Quant (Post): <3.1 m[IU]/mL — ABNORMAL LOW (ref >9.9)

## 2021-03-02 SURGERY — HEMORRHOIDECTOMY
Anesthesia: General | Site: Rectum

## 2021-03-02 MED ORDER — BUPIVACAINE LIPOSOME 1.3 % IJ SUSP
INTRAMUSCULAR | Status: DC | PRN
  Administered 2021-03-02: 20 mL

## 2021-03-02 MED ORDER — 0.9 % SODIUM CHLORIDE (POUR BTL) OPTIME
TOPICAL | Status: DC | PRN
  Administered 2021-03-02: 1000 mL

## 2021-03-02 MED ORDER — OXYCODONE HCL 5 MG PO TABS
5.0000 mg | ORAL_TABLET | ORAL | Status: DC | PRN
  Administered 2021-03-02 – 2021-03-04 (×5): 5 mg via ORAL
  Filled 2021-03-02 (×5): qty 1

## 2021-03-02 MED ORDER — LIDOCAINE HCL (PF) 2 % IJ SOLN
INTRAMUSCULAR | Status: AC
  Filled 2021-03-02: qty 5

## 2021-03-02 MED ORDER — FENTANYL CITRATE (PF) 100 MCG/2ML IJ SOLN
INTRAMUSCULAR | Status: DC | PRN
  Administered 2021-03-02 (×3): 50 ug via INTRAVENOUS

## 2021-03-02 MED ORDER — PHENYLEPHRINE HCL (PRESSORS) 10 MG/ML IV SOLN
INTRAVENOUS | Status: DC | PRN
  Administered 2021-03-02 (×2): 200 ug via INTRAVENOUS
  Administered 2021-03-02: 100 ug via INTRAVENOUS
  Administered 2021-03-02 (×5): 200 ug via INTRAVENOUS

## 2021-03-02 MED ORDER — ONDANSETRON HCL 4 MG/2ML IJ SOLN: 4.0000 mg | Freq: Once | INTRAMUSCULAR | Status: DC | PRN

## 2021-03-02 MED ORDER — KETAMINE HCL 10 MG/ML IJ SOLN
INTRAMUSCULAR | Status: DC | PRN
  Administered 2021-03-02 (×2): 10 mg via INTRAVENOUS

## 2021-03-02 MED ORDER — ONDANSETRON HCL 4 MG/2ML IJ SOLN
INTRAMUSCULAR | Status: AC
  Filled 2021-03-02: qty 2

## 2021-03-02 MED ORDER — FENTANYL CITRATE (PF) 100 MCG/2ML IJ SOLN
INTRAMUSCULAR | Status: AC
  Filled 2021-03-02: qty 2

## 2021-03-02 MED ORDER — AMIODARONE HCL IN DEXTROSE 360-4.14 MG/200ML-% IV SOLN
60.0000 mg/h | INTRAVENOUS | Status: DC
  Administered 2021-03-03 (×2): 60 mg/h via INTRAVENOUS
  Filled 2021-03-02 (×2): qty 200

## 2021-03-02 MED ORDER — MORPHINE SULFATE (PF) 2 MG/ML IV SOLN: 2.0000 mg | INTRAVENOUS | Status: DC | PRN

## 2021-03-02 MED ORDER — KETAMINE HCL 50 MG/5ML IJ SOSY
PREFILLED_SYRINGE | INTRAMUSCULAR | Status: AC
  Filled 2021-03-02: qty 5

## 2021-03-02 MED ORDER — DILTIAZEM HCL 25 MG/5ML IV SOLN
10.0000 mg | Freq: Once | INTRAVENOUS | Status: AC
  Administered 2021-03-02: 10 mg via INTRAVENOUS
  Filled 2021-03-02: qty 5

## 2021-03-02 MED ORDER — ACETAMINOPHEN 500 MG PO TABS
1000.0000 mg | ORAL_TABLET | Freq: Four times a day (QID) | ORAL | Status: DC
  Administered 2021-03-02 – 2021-03-03 (×3): 1000 mg via ORAL
  Filled 2021-03-02 (×3): qty 2

## 2021-03-02 MED ORDER — GELATIN ABSORBABLE 12-7 MM EX MISC
CUTANEOUS | Status: AC
  Filled 2021-03-02: qty 1

## 2021-03-02 MED ORDER — PROPOFOL 10 MG/ML IV BOLUS
INTRAVENOUS | Status: DC | PRN
  Administered 2021-03-02: 110 mg via INTRAVENOUS
  Administered 2021-03-02: 30 mg via INTRAVENOUS

## 2021-03-02 MED ORDER — SODIUM CHLORIDE 0.9 % IV BOLUS
250.0000 mL | Freq: Once | INTRAVENOUS | Status: AC
  Administered 2021-03-02: 250 mL via INTRAVENOUS

## 2021-03-02 MED ORDER — HEMOSTATIC AGENTS (NO CHARGE) OPTIME
TOPICAL | Status: DC | PRN
  Administered 2021-03-02 (×2): 1 via TOPICAL

## 2021-03-02 MED ORDER — PROPOFOL 10 MG/ML IV BOLUS
INTRAVENOUS | Status: AC
  Filled 2021-03-02: qty 20

## 2021-03-02 MED ORDER — AMIODARONE HCL IN DEXTROSE 360-4.14 MG/200ML-% IV SOLN
30.0000 mg/h | INTRAVENOUS | Status: DC
  Administered 2021-03-03 – 2021-03-04 (×2): 30 mg/h via INTRAVENOUS
  Filled 2021-03-02 (×3): qty 200

## 2021-03-02 MED ORDER — FENTANYL CITRATE (PF) 100 MCG/2ML IJ SOLN
25.0000 ug | INTRAMUSCULAR | Status: DC | PRN
  Administered 2021-03-02 (×4): 25 ug via INTRAVENOUS

## 2021-03-02 MED ORDER — ONDANSETRON HCL 4 MG/2ML IJ SOLN
INTRAMUSCULAR | Status: DC | PRN
  Administered 2021-03-02: 4 mg via INTRAVENOUS

## 2021-03-02 MED ORDER — AMIODARONE LOAD VIA INFUSION
150.0000 mg | Freq: Once | INTRAVENOUS | Status: AC
  Administered 2021-03-03: 01:00:00 150 mg via INTRAVENOUS
  Filled 2021-03-02: qty 83.34

## 2021-03-02 SURGICAL SUPPLY — 33 items
BLADE CLIPPER SURG (BLADE) ×3 IMPLANT
BLADE SURG 15 STRL LF DISP TIS (BLADE) ×2 IMPLANT
BLADE SURG 15 STRL SS (BLADE) ×1
BRIEF STRETCH FOR OB PAD XXL (UNDERPADS AND DIAPERS) ×3 IMPLANT
DRAPE 3/4 80X56 (DRAPES) ×3 IMPLANT
DRAPE LAPAROTOMY 77X122 PED (DRAPES) ×3 IMPLANT
DRAPE LEGGINS SURG 28X43 STRL (DRAPES) ×3 IMPLANT
DRAPE UNDER BUTTOCK W/FLU (DRAPES) ×3 IMPLANT
DRSG GAUZE FLUFF 36X18 (GAUZE/BANDAGES/DRESSINGS) ×3 IMPLANT
ELECT CAUTERY BLADE 6.4 (BLADE) ×3 IMPLANT
ELECT REM PT RETURN 9FT ADLT (ELECTROSURGICAL) ×3
ELECTRODE REM PT RTRN 9FT ADLT (ELECTROSURGICAL) ×2 IMPLANT
GAUZE 4X4 16PLY ~~LOC~~+RFID DBL (SPONGE) ×3 IMPLANT
GLOVE SURG ENC MOIS LTX SZ7 (GLOVE) ×3 IMPLANT
GOWN STRL REUS W/ TWL LRG LVL3 (GOWN DISPOSABLE) ×4 IMPLANT
GOWN STRL REUS W/TWL LRG LVL3 (GOWN DISPOSABLE) ×2
HEMOSTAT SURGICEL 2X14 (HEMOSTASIS) ×3 IMPLANT
MANIFOLD NEPTUNE II (INSTRUMENTS) ×3 IMPLANT
NEEDLE HYPO 22GX1.5 SAFETY (NEEDLE) ×3 IMPLANT
NS IRRIG 500ML POUR BTL (IV SOLUTION) ×3 IMPLANT
PACK BASIN MINOR ARMC (MISCELLANEOUS) ×3 IMPLANT
PAD ABD DERMACEA PRESS 5X9 (GAUZE/BANDAGES/DRESSINGS) ×3 IMPLANT
PAD PREP 24X41 OB/GYN DISP (PERSONAL CARE ITEMS) ×3 IMPLANT
SHEARS HARMONIC 9CM CVD (BLADE) ×3 IMPLANT
SOL PREP PVP 2OZ (MISCELLANEOUS) ×3
SOLUTION PREP PVP 2OZ (MISCELLANEOUS) ×2 IMPLANT
SPONGE SURGIFOAM ABS GEL 12-7 (HEMOSTASIS) ×3 IMPLANT
SPONGE T-LAP 18X18 ~~LOC~~+RFID (SPONGE) ×3 IMPLANT
SURGILUBE 2OZ TUBE FLIPTOP (MISCELLANEOUS) ×3 IMPLANT
SUT CHROMIC 2 0 SH (SUTURE) ×3 IMPLANT
SUT CHROMIC 3 0 SH 27 (SUTURE) ×9 IMPLANT
SYR 20ML LL LF (SYRINGE) ×3 IMPLANT
WATER STERILE IRR 500ML POUR (IV SOLUTION) ×3 IMPLANT

## 2021-03-02 NOTE — Transfer of Care (Signed)
Immediate Anesthesia Transfer of Care Note  Patient: Gregory Dixon  Procedure(s) Performed: HEMORRHOIDECTOMY EXAM UNDER ANESTHESIA (Rectum)  Patient Location: PACU  Anesthesia Type:General  Level of Consciousness: awake  Airway & Oxygen Therapy: Patient Spontanous Breathing  Post-op Assessment: Report given to RN  Post vital signs: stable  Last Vitals:  Vitals Value Taken Time  BP 95/63 03/02/21 1845  Temp 36.3 C 03/02/21 1842  Pulse 117 03/02/21 1846  Resp 25 03/02/21 1846  SpO2 98 % 03/02/21 1846  Vitals shown include unvalidated device data.  Last Pain:  Vitals:   03/02/21 1614  TempSrc: Oral  PainSc: 0-No pain         Complications: No notable events documented.

## 2021-03-02 NOTE — Progress Notes (Signed)
Received report from out going PACU RN.  Pt in afib RVR during surgery and in PACU.  HR ranged from 108 to 160s.  Anesthesiologist Dr. Eugenie Birks did not want to treat in PACU.  Stated pt came in that way.  Informed Dr. Billie Ruddy, pt's hospitalist.  Dr. Billie Ruddy wrote order for cardizem.

## 2021-03-02 NOTE — Progress Notes (Addendum)
[facility]  ROUNDING NOTE   Subjective:   Gregory Dixon is a 80 year old male with past medical conditions including hypertension, diabetes, MGUS, GERD, GI bleed, cholecystitis with percutaneous cholecystostomy placement, ESRD on dialysis. He presents to the ED with bright red rectal bleeding for 2 days. He has been admitted for Weakness [R53.1] GI bleed [K92.2] Atrial fibrillation with normal ventricular rate (HCC) [I48.91] Symptomatic anemia [D64.9] Gastrointestinal hemorrhage, unspecified gastrointestinal hemorrhage type [K92.2]  Patient is known to our practice for multiple admission and received outpatient dialysis treatments at [facility] on MWF schedule, supervised by Dr Candiss Norse.  Patient seen sitting at side of bed this morning States he feels well Continues to complain of some fatigue Denies shortness of breath Patient seen later in am stating he will go for surgery this afternoon.  Hemoglobin 7.6  Objective:  Vital signs in last 24 hours:  Temp:  [97.6 F (36.4 C)-98.7 F (37.1 C)] 98 F (36.7 C) (11/26 0759) Pulse Rate:  [55-117] 61 (11/26 0759) Resp:  [14-31] 18 (11/26 0759) BP: (100-138)/(54-89) 100/81 (11/26 0759) SpO2:  [98 %-100 %] 100 % (11/26 0759) Weight:  [90.7 kg-91.1 kg] 90.7 kg (11/25 1628)  Weight change: 2.195 kg Filed Weights   02/28/21 1025 03/01/21 1306 03/01/21 1628  Weight: 88.9 kg 91.1 kg 90.7 kg    Intake/Output: I/O last 3 completed shifts: In: 66 [P.O.:480; Blood:410] Out: 837 [Urine:400; Drains:250; Other:187]   Intake/Output this shift:  Total I/O In: 120 [P.O.:120] Out: -   Physical Exam: General: NAD, resting in bed  Head: Normocephalic, atraumatic. Moist oral mucosal membranes  Eyes: Anicteric  Lungs:  Clear to auscultation, normal effort  Heart: Regular rate and rhythm  Abdomen:  Soft, nontender,   Extremities: Trace peripheral edema.  Neurologic: Alert, moving all four extremities  Skin: No lesions   Access: Right IJ PermCath    Basic Metabolic Panel: Recent Labs  Lab 02/25/21 0319 02/28/21 1025 03/01/21 0439 03/02/21 0525  NA 134* 137 139 136  K 3.5 3.4* 3.6 3.5  CL 103 106 106 103  CO2 24 20* 27 29  GLUCOSE 119* 134* 105* 116*  BUN 40* 28* 36* 24*  CREATININE 7.98* 5.70* 6.66* 4.38*  CALCIUM 7.9* 7.7* 7.7* 7.5*  MG  --  2.0 1.7 1.8  PHOS  --   --  4.4  --      Liver Function Tests: Recent Labs  Lab 02/28/21 1025 03/01/21 0439  AST 29 15  ALT 18 15  ALKPHOS 43 34*  BILITOT 0.7 0.7  PROT 5.4* 4.9*  ALBUMIN 2.3* 2.2*    No results for input(s): LIPASE, AMYLASE in the last 168 hours. No results for input(s): AMMONIA in the last 168 hours.  CBC: Recent Labs  Lab 02/25/21 0319 02/28/21 1025 03/01/21 0439 03/01/21 1410 03/02/21 0525  WBC 4.1 8.9 5.1 4.7 3.9*  HGB 8.0* 7.2* 7.0* 7.7* 7.6*  HCT 24.7* 23.3* 21.5* 23.2* 22.8*  MCV 86.4 93.6 88.8 88.9 88.7  PLT 153 191 151 151 141*     Cardiac Enzymes: No results for input(s): CKTOTAL, CKMB, CKMBINDEX, TROPONINI in the last 168 hours.  BNP: Invalid input(s): POCBNP  CBG: Recent Labs  Lab 02/24/21 1631 02/24/21 2057 02/25/21 0759 02/25/21 1410 03/01/21 2007  GLUCAP 115* 144* 115* 168* 182*     Microbiology: Results for orders placed or performed during the hospital encounter of 02/28/21  Resp Panel by RT-PCR (Flu A&B, Covid) Nasopharyngeal Swab     Status:  Abnormal   Collection Time: 02/28/21 10:25 AM   Specimen: Nasopharyngeal Swab; Nasopharyngeal(NP) swabs in vial transport medium  Result Value Ref Range Status   SARS Coronavirus 2 by RT PCR POSITIVE (A) NEGATIVE Final    Comment: RESULT CALLED TO, READ BACK BY AND VERIFIED WITH: MORGAN CATES RN @1142  02/28/21 [facility]CS (NOTE) SARS-CoV-2 target nucleic acids are DETECTED.  The SARS-CoV-2 RNA is generally detectable in upper respiratory specimens during the acute phase of infection. Positive results are indicative of the presence of the  identified virus, but do not rule out bacterial infection or co-infection with other pathogens not detected by the test. Clinical correlation with patient history and other diagnostic information is necessary to determine patient infection status. The expected result is Negative.  Fact Sheet for Patients: EntrepreneurPulse.com.au  Fact Sheet for Healthcare Providers: IncredibleEmployment.be  This test is not yet approved or cleared by the [place] FDA and  has been authorized for detection and/or diagnosis of SARS-CoV-2 by FDA under an Emergency Use Authorization (EUA).  This EUA will remain in effect (meaning this test can b e used) for the duration of  the COVID-19 declaration under Section 564(b)(1) of the Act, 21 U.S.C. section 360bbb-3(b)(1), unless the authorization is terminated or revoked sooner.     Influenza A by PCR NEGATIVE NEGATIVE Final   Influenza B by PCR NEGATIVE NEGATIVE Final    Comment: (NOTE) The Xpert Xpress SARS-CoV-2/FLU/RSV plus assay is intended as an aid in the diagnosis of influenza from Nasopharyngeal swab specimens and should not be used as a sole basis for treatment. Nasal washings and aspirates are unacceptable for Xpert Xpress SARS-CoV-2/FLU/RSV testing.  Fact Sheet for Patients: EntrepreneurPulse.com.au  Fact Sheet for Healthcare Providers: IncredibleEmployment.be  This test is not yet approved or cleared by the [place] FDA and has been authorized for detection and/or diagnosis of SARS-CoV-2 by FDA under an Emergency Use Authorization (EUA). This EUA will remain in effect (meaning this test can be used) for the duration of the COVID-19 declaration under Section 564(b)(1) of the Act, 21 U.S.C. section 360bbb-3(b)(1), unless the authorization is terminated or revoked.  Performed at [facility], [address] [place], [place] [address]      Coagulation Studies: Recent Labs    02/28/21 1025  LABPROT 18.3*  INR 1.5*     Urinalysis: No results for input(s): COLORURINE, LABSPEC, PHURINE, GLUCOSEU, HGBUR, BILIRUBINUR, KETONESUR, PROTEINUR, UROBILINOGEN, NITRITE, LEUKOCYTESUR in the last 72 hours.  Invalid input(s): APPERANCEUR    Imaging: No results found.   Medications:    sodium chloride Stopped (02/28/21 1139)   sodium chloride Stopped (02/28/21 1609)    vitamin C  500 mg Oral BID   carvedilol  6.25 mg Oral BID WC   Chlorhexidine Gluconate Cloth  6 each Topical Daily   feeding supplement  1 Container Oral TID BM   feeding supplement (PROSource TF)  45 mL Per Tube TID   multivitamin  1 tablet Oral QHS   multivitamin with minerals  1 tablet Oral Daily   REM  Assessment/ Plan:  Mr. Maxi Rodas is a 80 y.o.  male with past medical conditions including hypertension, diabetes, MGUS, GERD, GI bleed, cholecystitis with percutaneous cholecystostomy placement, ESRD on dialysis. He presents to the ED with bright red rectal bleeding for 2 days. He has been admitted for Weakness [R53.1] GI bleed [K92.2] Atrial fibrillation with normal ventricular rate (HCC) [I48.91] Symptomatic anemia [D64.9] Gastrointestinal hemorrhage, unspecified gastrointestinal hemorrhage type [K92.2]   [facility]  [facility]/MWF/Rt Permcath  End-stage renal disease requiring hemodialysis.  Will maintain outpatient scheduling if possible.  Patient received scheduled dialysis treatment yesterday.  Tolerated well.  Next treatment scheduled for Monday.  2. Anemia of chronic kidney disease  Lab Results  Component Value Date   HGB 7.6 (L) 03/02/2021    Patient has received 3 units of blood thus far this admission.  Mircera and Venofer are given outpatient. GI will defer to surgery for evaluation of hemorrhoids.  Hemoglobin remains below target, currently awaiting surgical consult.  3. Secondary Hyperparathyroidism: with outpatient labs:  phosphorus 5.1, corrected calcium 8.7 on 02/18/21.   Lab Results  Component Value Date   CALCIUM 7.5 (L) 03/02/2021   PHOS 4.4 03/01/2021    Currently receives vitamin D outpatient.  Phosphorus at goal. Corrected calcium 8.9  4. Diabetes mellitus type II with chronic kidney disease: insulin dependent. Home regimen includes NPH. Most recent hemoglobin A1c is 5.6 on 01/25/21.  Maintaining glucose control  5.  Hypertension with chronic kidney disease.  Home regimen includes carvedilol and diltiazem.  All held. BP stable    LOS: 2 Taeler Winning 11/26/202211:17 AM

## 2021-03-02 NOTE — Op Note (Signed)
  03/02/2021  6:35 PM  PATIENT:  Gregory Dixon  80 y.o. male  PRE-OPERATIVE DIAGNOSIS: lower GI bleed from internal hemorrhoid  POST-OPERATIVE DIAGNOSIS:  Same  PROCEDURE:   Exam under anesthesia Internal  hemorrhoidectomy two columns Posterior midline and Right posterolateral ( excision extends to the external component)  SURGEON:  Surgeon(s) and Role:    * Monique Hefty F, MD - Primary  FINDINGS: Active bleeding from grade Iv internal hemorrhoid, culprit from posterior midline.  ANESTHESIA: General   DICTATION:  Patient was explained about the procedure in detail. Risks, benefits, possible complications and a consent was obtained. The patient taken to the operating room and placed in the lithotomy position w all pressure points padded..  Exam revealed active bleeding from the posterior midline grade 4 hemorrhoid.  Attention was turned to it and it was lifted with Allis clamps.  I performed a hemorrhoidectomy by lifting the cushion with an Allis.  I used the harmonic device.  Specimen was sent to pathology.  There was persistent bleeding from this question and I had to place multiple interrupted 2 0 chromic sutures in a figure-of-eight fashion.  This control the bleeding.  A right posterior lateral hemorrhoid was also ulcerated and we went ahead and remove it using the harmonic device. I placed Surgicel and Gelfoam as topical hemostatic agent.   Liposomal Marcaine was injected around the perianal area for postoperative analgesia    Needle and laparotomy counts were correct and there were no immediate complications  Jules Husbands, MD

## 2021-03-02 NOTE — Anesthesia Postprocedure Evaluation (Signed)
Anesthesia Post Note  Patient: Gregory Dixon  Procedure(s) Performed: HEMORRHOIDECTOMY EXAM UNDER ANESTHESIA (Rectum)  Patient location during evaluation: PACU Anesthesia Type: General Level of consciousness: awake and awake and alert Pain management: pain level controlled Vital Signs Assessment: post-procedure vital signs reviewed and stable Respiratory status: spontaneous breathing, respiratory function stable and nonlabored ventilation Cardiovascular status: blood pressure returned to baseline Anesthetic complications: no   No notable events documented.   Last Vitals:  Vitals:   03/02/21 1900 03/02/21 1912  BP: 97/71 117/69  Pulse: 77 71  Resp: (!) 26 (!) 22  Temp:    SpO2: 97% 98%    Last Pain:  Vitals:   03/02/21 1912  TempSrc:   PainSc: 0-No pain                 Deno Etienne

## 2021-03-02 NOTE — Consult Note (Signed)
Patient ID: Gregory Dixon, male   DOB: [birth year], 80 y.o.   MRN: [id number]  HPI Gregory Dixon is a 80 y.o. male seen in consultation at the request of Dr. Billie Ruddy for persistent lower GI bleed. He was admitted last week for rectal bleeding and completed an EGD and colonoscopy  that did not show any source of GI bleeding despite the patient reporting bright red blood with every bowel movement.  The patient states he went home and continued to bleed and was put back on his anticoagulation.   He continues to have hematochezia after bowel movement.  Early this morning he already had 2 bowel movements that were bloody.  His hemoglobin has been between 7 and 8 for the last 79 days.  He did get a blood transfusion when his hemoglobin was 7 and the hemoglobin today went up to 7.6, not as good as a response suspected.  BMP today shows a creatinine of 4.3 rest of the labs are normal He does have history of end-stage renal disease on dialysis and also had a recent episode of acute cholecystitis requiring percutaneous cholecystostomy drain.  He was seen by Dr. Christian Mate on Jan 23 2021 for cholecystitis and at that time required cholecystostomy tube He recently had a syncopal episode and trauma and had failed.  At that time he underwent a CT scan of the abdomen pelvis that I personally reviewed failed to show any intra-abdominal injuries.  The cholecystostomy tube has been functioning and was in place. He does have a history of A. fib with RVR and is anticoagulated on Eliquis.  This has been held for the last 2 days or so. Dr. Rochel Brome was his surgeon and apparently he did some hemorrhoidal banding several years ago   HPI  Past Medical History:  Diagnosis Date   Actinic keratosis    Basal cell carcinoma 10/21/2017   left lateral neck, excised 02/09/2018   Basal cell carcinoma (BCC) 05/12/2019   right posterior ear, excised 05/31/2019   Blood transfusion without reported diagnosis    Diabetes mellitus without  complication ([place])    Dysrhythmia     Past Surgical History:  Procedure Laterality Date   COLONOSCOPY N/A 02/24/2021   Procedure: COLONOSCOPY;  Surgeon: [name], MD;  Location[facility] ENDOSCOPY;  Service: Endoscopy;  Laterality: N/A;   COLONOSCOPY WITH PROPOFOL N/A 04/29/2019   Procedure: COLONOSCOPY WITH PROPOFOL;  Surgeon: Jonathon Bellows, MD;  Location: [facility] ENDOSCOPY;  Service: Gastroenterology;  Laterality: N/A;   DIALYSIS/PERMA CATHETER INSERTION Bilateral 01/28/2021   Procedure: DIALYSIS/PERMA CATHETER INSERTION;  Surgeon: Algernon Huxley, MD;  Location: [facility];  Service: Cardiovascular;  Laterality: Bilateral;   ESOPHAGOGASTRODUODENOSCOPY N/A 02/24/2021   Procedure: ESOPHAGOGASTRODUODENOSCOPY (EGD);  Surgeon: [name], MD;  Location: [facility] ENDOSCOPY;  Service: Endoscopy;  Laterality: N/A;   ESOPHAGOGASTRODUODENOSCOPY (EGD) WITH PROPOFOL N/A 04/29/2019   Procedure: ESOPHAGOGASTRODUODENOSCOPY (EGD) WITH PROPOFOL;  Surgeon: Jonathon Bellows, MD;  Location: [facility] ENDOSCOPY;  Service: Gastroenterology;  Laterality: N/A;   IR PERC CHOLECYSTOSTOMY  01/25/2021    Family History  Problem Relation Age of Onset   Hypertension Father     Social History Social History   Tobacco Use   Smoking status: Former   Smokeless tobacco: Never  Scientific laboratory technician Use: Never used  Substance Use Topics   Alcohol use: Not Currently    Comment: rare once a year   Drug use: Never    Allergies  Allergen Reactions   Aspirin Other (See Comments)  GI bleed   Codeine Nausea Only and Nausea And Vomiting    Current Facility-Administered Medications  Medication Dose Route Frequency Provider Last Rate Last Admin   0.9 %  sodium chloride infusion  10 mL/hr Intravenous Once Adefeso, Oladapo, DO   Held at 02/28/21 1139   ascorbic acid (VITAMIN C) tablet 500 mg  500 mg Oral BID Enzo Bi, MD   500 mg at 03/02/21 1033   carvedilol (COREG) tablet 6.25 mg  6.25 mg Oral BID WC Enzo Bi, MD   6.25  mg at 03/01/21 1740   Chlorhexidine Gluconate Cloth 2 % PADS 6 each  6 each Topical Daily Enzo Bi, MD   6 each at 03/02/21 1034   feeding supplement (BOOST / RESOURCE BREEZE) liquid 1 Container  1 Container Oral TID BM Enzo Bi, MD   1 Container at 03/01/21 2205   feeding supplement (PROSource TF) liquid 45 mL  45 mL Per Tube TID Enzo Bi, MD   45 mL at 03/01/21 2205   multivitamin (RENA-VIT) tablet 1 tablet  1 tablet Oral QHS Enzo Bi, MD   1 tablet at 03/01/21 2202   multivitamin with minerals tablet 1 tablet  1 tablet Oral Daily Enzo Bi, MD   1 tablet at 03/02/21 1033   sodium chloride 0.9 % bolus 500 mL  500 mL Intravenous Once Bernadette Hoit, DO   Held at 02/28/21 1609     Review of Systems Full ROS  was asked and was negative except for the information on the HPI  Physical Exam Blood pressure 100/81, pulse 61, temperature 98 F (36.7 C), temperature source Oral, resp. rate 18, height 6\' 6"  (1.981 m), weight 90.7 kg, SpO2 100 %. CONSTITUTIONAL: NAD. EYES: Pupils are equal, round,  Sclera are non-icteric. EARS, NOSE, MOUTH AND THROAT: He is wearing  a mask Hearing is intact to voice. LYMPH NODES:  Lymph nodes in the neck are normal. RESPIRATORY:  Lungs are clear. There is normal respiratory effort, with equal breath sounds bilaterally, and without pathologic use of accessory muscles. CARDIOVASCULAR: Heart is regular without murmurs, gallops, or rubs. GI: The abdomen is  soft, nontender, and nondistended. There are no palpable masses. There is no hepatosplenomegaly. There are normal bowel sounds in all quadrants. Cholecystostomy tube in place draining bile Rectal: there is no blood. No masses. Non tender to palpation   MUSCULOSKELETAL: Normal muscle strength and tone. No cyanosis or edema.   SKIN: Turgor is good and there are no pathologic skin lesions or ulcers. NEUROLOGIC: Motor and sensation is grossly normal. Cranial nerves are grossly intact. PSYCH:  Oriented to person,  place and time. Affect is normal.  Data Reviewed  I have personally reviewed the patient's imaging, laboratory findings and medical records.    Assessment/Plan 80 year old male with persistent lower GI bleed.  Given persistent hematochezia in a patient that needs anticoagulation next that will be examined anesthesia.  Likely will need hemorrhoidectomy at the same time.  Procedure discussed with the patient in detail.  Risks, benefits and possible complications including but not limited to: Bleeding, infection chronic pain.  He understands and wished to proceed.  We will perform this today pending or availability. He continues to bleed may need an bleeding scan to rule out any evidence of a small bowel bleeding.  Caroleen Hamman, MD FACS General Surgeon 03/02/2021, 1:50 PM

## 2021-03-02 NOTE — Progress Notes (Signed)
PROGRESS NOTE    Gregory Dixon  ZMO:[id number] DOB: Apr 16, 1940 DOA: 02/28/2021 PCP: Valera Castle, MD  [phone]   Assessment & Plan:   Principal Problem:   GI bleed Active Problems:   Acute blood loss anemia   Essential hypertension   Symptomatic anemia   Atrial fibrillation with RVR (HCC)   Hypokalemia   COVID-19 virus infection   Hyperglycemia due to diabetes mellitus ([place])   History of cholecystitis   End-stage renal disease on hemodialysis (HCC)   Gregory Dixon is a 80 y.o. male with medical history significant for type 2 diabetes mellitus, hypertension, ESRD on HD (MWF), chronic blood loss/GI bleed, MGUS, GERD, cholecystitis s/p IR percutaneous cholecystostomy placement who presents to the emergency department due to weakness and rectal bleeding.  Patient complained of 2-day onset of intermittent rectal bleeding with significant bright red blood per rectum.  Patient was noted to be very weak when he used the restroom this morning  by wife and he required assistance to prevent him from falling. Bright red blood was noted in the commode.  EMS was activated and patient was taken to the ED for further evaluation and management Patient endorsed chronic history of intermittent GI bleed, states that he received blood transfusion 2 weeks ago and also received blood transfusion last week.  Last colonoscopy was on Monday (11/21) by Dr. Allen Norris and there was no significant findings.    GI bleed 2/2 internal hemorrhoids in the setting of Eliquis Acute on chronic blood loss anemia --fresh blood seen on the wipes and dripping after BM's, but no blood with stool. --s/p 3u pRBC --No need for further GI scoping, per GI Plan: --GenSurg to take pt for HEMORRHOIDECTOMY later today. --monitor Hgb and transfuse to keep Hgb >7 --hold home Eliquis  A. fib with RVR He was started on IV Cardizem drip in the ED, this was discontinued due to soft BP.   --cont home coreg --hold home  cardizem for now  --hold home Eliquis due to GI bleed  Hypokalemia --monitor and replete PRN   Prolonged QTc (543 ms) Avoid QT prolonging drugs   Hypoalbuminemia  Severe malnutrition in context of chronic illness Albumin 2.3 --supplements per dietician   Hx of type II DM, not currently active --A1c 5.6  Hx of Essential hypertension --BP has been low --cont home coreg --hold home cardizem for now   History of cholecystitis s/p percutaneous cholecystostomy drain Continue with drain flushing/care   Recent COVID-19 virus infection This was initially positive on 11/9 and finished quarantine on 11/20  ESRD on HD (MWF) --iHD per nephrology    DVT prophylaxis: SCD/Compression stockings Code Status: DNR  Family Communication:  Level of care: Telemetry Medical Dispo:   The patient is from: home Anticipated d/c is to: home Anticipated d/c date is: 1-2 days Patient currently is not medically ready to d/c due to: pending hemorrhoidectomy    Subjective and Interval History:  Pt reported continued blood per rectum with BM's.  Hgb stable in 7's.    GenSurg to take pt for HEMORRHOIDECTOMY later today.   Objective: Vitals:   03/01/21 2001 03/02/21 0415 03/02/21 0759 03/02/21 1614  BP: 121/71 104/85 100/81 116/61  Pulse: 64 73 61 81  Resp: 18 18 18 18   Temp: 98.1 F (36.7 C) 97.8 F (36.6 C) 98 F (36.7 C) 98.3 F (36.8 C)  TempSrc: Oral Oral Oral Oral  SpO2: 100% 100% 100% 99%  Weight:      Height:  Intake/Output Summary (Last 24 hours) at 03/02/2021 1633 Last data filed at 03/02/2021 1033 Gross per 24 hour  Intake 360 ml  Output 695 ml  Net -335 ml   Filed Weights   02/28/21 1025 03/01/21 1306 03/01/21 1628  Weight: 88.9 kg 91.1 kg 90.7 kg    Examination:   Constitutional: NAD, AAOx3 HEENT: conjunctivae and lids normal, EOMI CV: No cyanosis.   RESP: normal respiratory effort, on RA SKIN: warm, dry Neuro: II - XII grossly intact.   Psych:  Normal mood and affect.  Appropriate judgement and reason   Data Reviewed: I have personally reviewed following labs and imaging studies  CBC: Recent Labs  Lab 02/25/21 0319 02/28/21 1025 03/01/21 0439 03/01/21 1410 03/02/21 0525  WBC 4.1 8.9 5.1 4.7 3.9*  HGB 8.0* 7.2* 7.0* 7.7* 7.6*  HCT 24.7* 23.3* 21.5* 23.2* 22.8*  MCV 86.4 93.6 88.8 88.9 88.7  PLT 153 191 151 151 676*   Basic Metabolic Panel: Recent Labs  Lab 02/25/21 0319 02/28/21 1025 03/01/21 0439 03/02/21 0525  NA 134* 137 139 136  K 3.5 3.4* 3.6 3.5  CL 103 106 106 103  CO2 24 20* 27 29  GLUCOSE 119* 134* 105* 116*  BUN 40* 28* 36* 24*  CREATININE 7.98* 5.70* 6.66* 4.38*  CALCIUM 7.9* 7.7* 7.7* 7.5*  MG  --  2.0 1.7 1.8  PHOS  --   --  4.4  --    GFR: Estimated Creatinine Clearance: 17.5 mL/min (A) (by C-G formula based on SCr of 4.38 mg/dL (H)). Liver Function Tests: Recent Labs  Lab 02/28/21 1025 03/01/21 0439  AST 29 15  ALT 18 15  ALKPHOS 43 34*  BILITOT 0.7 0.7  PROT 5.4* 4.9*  ALBUMIN 2.3* 2.2*   No results for input(s): LIPASE, AMYLASE in the last 168 hours. No results for input(s): AMMONIA in the last 168 hours. Coagulation Profile: Recent Labs  Lab 02/28/21 1025  INR 1.5*   Cardiac Enzymes: No results for input(s): CKTOTAL, CKMB, CKMBINDEX, TROPONINI in the last 168 hours. BNP (last 3 results) No results for input(s): PROBNP in the last 8760 hours. HbA1C: No results for input(s): HGBA1C in the last 72 hours. CBG: Recent Labs  Lab 02/24/21 1631 02/24/21 2057 02/25/21 0759 02/25/21 1410 03/01/21 2007  GLUCAP 115* 144* 115* 168* 182*   Lipid Profile: No results for input(s): CHOL, HDL, LDLCALC, TRIG, CHOLHDL, LDLDIRECT in the last 72 hours. Thyroid Function Tests: No results for input(s): TSH, T4TOTAL, FREET4, T3FREE, THYROIDAB in the last 72 hours. Anemia Panel: No results for input(s): VITAMINB12, FOLATE, FERRITIN, TIBC, IRON, RETICCTPCT in the last 72 hours. Sepsis  Labs: No results for input(s): PROCALCITON, LATICACIDVEN in the last 168 hours.  Recent Results (from the past 240 hour(s))  Resp Panel by RT-PCR (Flu A&B, Covid) Nasopharyngeal Swab     Status: Abnormal   Collection Time: 02/20/21  8:22 PM   Specimen: Nasopharyngeal Swab; Nasopharyngeal(NP) swabs in vial transport medium  Result Value Ref Range Status   SARS Coronavirus 2 by RT PCR POSITIVE (A) NEGATIVE Final    Comment: RESULT CALLED TO, READ BACK BY AND VERIFIED WITH: WHITNEY HICKS @2203  ON 02/20/21 (NOTE) SARS-CoV-2 target nucleic acids are DETECTED.  The SARS-CoV-2 RNA is generally detectable in upper respiratory specimens during the acute phase of infection. Positive results are indicative of the presence of the identified virus, but do not rule out bacterial infection or co-infection with other pathogens not detected by the test. Clinical correlation with  patient history and other diagnostic information is necessary to determine patient infection status. The expected result is Negative.  Fact Sheet for Patients: EntrepreneurPulse.com.au  Fact Sheet for Healthcare Providers: IncredibleEmployment.be  This test is not yet approved or cleared by the [place] FDA and  has been authorized for detection and/or diagnosis of SARS-CoV-2 by FDA under an Emergency Use Authorization (EUA).  This EUA will remain in effect (meaning this test can be u sed) for the duration of  the COVID-19 declaration under Section 564(b)(1) of the Act, 21 U.S.C. section 360bbb-3(b)(1), unless the authorization is terminated or revoked sooner.     Influenza A by PCR NEGATIVE NEGATIVE Final   Influenza B by PCR NEGATIVE NEGATIVE Final    Comment: (NOTE) The Xpert Xpress SARS-CoV-2/FLU/RSV plus assay is intended as an aid in the diagnosis of influenza from Nasopharyngeal swab specimens and should not be used as a sole basis for treatment. Nasal washings  and aspirates are unacceptable for Xpert Xpress SARS-CoV-2/FLU/RSV testing.  Fact Sheet for Patients: EntrepreneurPulse.com.au  Fact Sheet for Healthcare Providers: IncredibleEmployment.be  This test is not yet approved or cleared by the [place] FDA and has been authorized for detection and/or diagnosis of SARS-CoV-2 by FDA under an Emergency Use Authorization (EUA). This EUA will remain in effect (meaning this test can be used) for the duration of the COVID-19 declaration under Section 564(b)(1) of the Act, 21 U.S.C. section 360bbb-3(b)(1), unless the authorization is terminated or revoked.  Performed at [facility], [address] [place], [place] [address]   Resp Panel by RT-PCR (Flu A&B, Covid) Nasopharyngeal Swab     Status: Abnormal   Collection Time: 02/28/21 10:25 AM   Specimen: Nasopharyngeal Swab; Nasopharyngeal(NP) swabs in vial transport medium  Result Value Ref Range Status   SARS Coronavirus 2 by RT PCR POSITIVE (A) NEGATIVE Final    Comment: RESULT CALLED TO, READ BACK BY AND VERIFIED WITH: MORGAN CATES RN @1142  02/28/21 SCS (NOTE) SARS-CoV-2 target nucleic acids are DETECTED.  The SARS-CoV-2 RNA is generally detectable in upper respiratory specimens during the acute phase of infection. Positive results are indicative of the presence of the identified virus, but do not rule out bacterial infection or co-infection with other pathogens not detected by the test. Clinical correlation with patient history and other diagnostic information is necessary to determine patient infection status. The expected result is Negative.  Fact Sheet for Patients: EntrepreneurPulse.com.au  Fact Sheet for Healthcare Providers: IncredibleEmployment.be  This test is not yet approved or cleared by the [place] FDA and  has been authorized for detection and/or diagnosis of SARS-CoV-2 by FDA  under an Emergency Use Authorization (EUA).  This EUA will remain in effect (meaning this test can b e used) for the duration of  the COVID-19 declaration under Section 564(b)(1) of the Act, 21 U.S.C. section 360bbb-3(b)(1), unless the authorization is terminated or revoked sooner.     Influenza A by PCR NEGATIVE NEGATIVE Final   Influenza B by PCR NEGATIVE NEGATIVE Final    Comment: (NOTE) The Xpert Xpress SARS-CoV-2/FLU/RSV plus assay is intended as an aid in the diagnosis of influenza from Nasopharyngeal swab specimens and should not be used as a sole basis for treatment. Nasal washings and aspirates are unacceptable for Xpert Xpress SARS-CoV-2/FLU/RSV testing.  Fact Sheet for Patients: EntrepreneurPulse.com.au  Fact Sheet for Healthcare Providers: IncredibleEmployment.be  This test is not yet approved or cleared by the [place] FDA and has been authorized for detection and/or diagnosis  of SARS-CoV-2 by FDA under an Emergency Use Authorization (EUA). This EUA will remain in effect (meaning this test can be used) for the duration of the COVID-19 declaration under Section 564(b)(1) of the Act, 21 U.S.C. section 360bbb-3(b)(1), unless the authorization is terminated or revoked.  Performed at [facility], [address], [place], [place] [address]       Radiology Studies: No results found.   Scheduled Meds:  vitamin C  500 mg Oral BID   carvedilol  6.25 mg Oral BID WC   Chlorhexidine Gluconate Cloth  6 each Topical Daily   feeding supplement  1 Container Oral TID BM   feeding supplement (PROSource TF)  45 mL Per Tube TID   multivitamin  1 tablet Oral QHS   multivitamin with minerals  1 tablet Oral Daily   Continuous Infusions:  sodium chloride Stopped (02/28/21 1139)   sodium chloride Stopped (02/28/21 1609)     LOS: 2 days     Enzo Bi, MD [facility] If 7PM-7AM, please contact  night-coverage 03/02/2021, 4:33 PM

## 2021-03-02 NOTE — Anesthesia Procedure Notes (Signed)
Procedure Name: LMA Insertion Date/Time: 03/02/2021 5:32 PM Performed by: Garner Nash, CRNA Pre-anesthesia Checklist: Patient identified, Emergency Drugs available, Suction available and Patient being monitored Patient Re-evaluated:Patient Re-evaluated prior to induction Oxygen Delivery Method: Circle system utilized Preoxygenation: Pre-oxygenation with 100% oxygen Induction Type: IV induction Ventilation: Mask ventilation without difficulty LMA: LMA inserted LMA Size: 5.0 Tube type: Oral Number of attempts: 1 Placement Confirmation: positive ETCO2 and breath sounds checked- equal and bilateral Tube secured with: Tape Dental Injury: Teeth and Oropharynx as per pre-operative assessment

## 2021-03-02 NOTE — Anesthesia Preprocedure Evaluation (Addendum)
Anesthesia Evaluation  Patient identified by MRN, date of birth, ID band Patient awake  General Assessment Comment:+snoring  Reviewed: Allergy & Precautions, NPO status , Patient's Chart, lab work & pertinent test results  Airway Mallampati: II  TM Distance: >3 FB Neck ROM: Full    Dental  (+) Upper Dentures, Lower Dentures   Pulmonary former smoker,  Hx COVID-19          Cardiovascular hypertension, + dysrhythmias Atrial Fibrillation + Valvular Problems/Murmurs   ECG 02/21/21:  Atrial fibrillation with rapid ventricular response with premature ventricular or aberrantly conducted complexes Septal infarct , age undetermined   Neuro/Psych  Neuromuscular disease    GI/Hepatic   Endo/Other  diabetes, Insulin Dependent  Renal/GU ESRF and DialysisRenal disease     Musculoskeletal   Abdominal   Peds  Hematology  (+) anemia ,   Anesthesia Other Findings anticoagulation  Reproductive/Obstetrics                           Anesthesia Physical Anesthesia Plan  ASA: 4 and emergent  Anesthesia Plan: General   Post-op Pain Management:    Induction: Intravenous  PONV Risk Score and Plan:   Airway Management Planned: LMA  Additional Equipment:   Intra-op Plan:   Post-operative Plan: Extubation in OR  Informed Consent: I have reviewed the patients History and Physical, chart, labs and discussed the procedure including the risks, benefits and alternatives for the proposed anesthesia with the patient or authorized representative who has indicated his/her understanding and acceptance.     Dental advisory given  Plan Discussed with:   Anesthesia Plan Comments: (OSA score 4)     Anesthesia Quick Evaluation                                  Anesthesia Evaluation  Patient identified by MRN, date of birth, ID band Patient awake    Reviewed: Allergy & Precautions, NPO status , Patient's  Chart, lab work & pertinent test results  Airway Mallampati: II      Comment: Dentures  Dental  (+) Lower Dentures, Upper Dentures   Pulmonary former smoker,  COVID+ 02/13/21   Pulmonary exam normal        Cardiovascular hypertension, + dysrhythmias (a fib on Eliquis, currently held)  Rhythm:Irregular Rate:Abnormal  ECG 02/21/21:  Atrial fibrillation with rapid ventricular response with premature ventricular or aberrantly conducted complexes Septal infarct , age undetermined   Neuro/Psych    GI/Hepatic BRBPR   Endo/Other  diabetes, Type 2, Insulin Dependent  Renal/GU ESRF and DialysisRenal disease (last HD 02/22/21)   BPH    Musculoskeletal   Abdominal   Peds  Hematology  (+) Blood dyscrasia, anemia ,   Anesthesia Other Findings   Reproductive/Obstetrics                           Anesthesia Physical Anesthesia Plan  ASA: 4 and emergent  Anesthesia Plan: General   Post-op Pain Management:    Induction: Intravenous  PONV Risk Score and Plan: 2 and Propofol infusion, TIVA and Treatment may vary due to age or medical condition  Airway Management Planned: Natural Airway  Additional Equipment:   Intra-op Plan:   Post-operative Plan:   Informed Consent: I have reviewed the patients History and Physical, chart, labs and discussed the procedure including the risks, benefits and alternatives  for the proposed anesthesia with the patient or authorized representative who has indicated his/her understanding and acceptance.   Patient has DNR.  Discussed DNR with patient.   Dental advisory given  Plan Discussed with: CRNA  Anesthesia Plan Comments:      Anesthesia Quick Evaluation

## 2021-03-03 ENCOUNTER — Encounter: Payer: Self-pay | Admitting: Surgery

## 2021-03-03 LAB — CBC
HCT: 22.7 % — ABNORMAL LOW (ref 39.0–52.0)
Hemoglobin: 7.4 g/dL — ABNORMAL LOW (ref 13.0–17.0)
MCH: 29.4 pg (ref 26.0–34.0)
MCHC: 32.6 g/dL (ref 30.0–36.0)
MCV: 90.1 fL (ref 80.0–100.0)
Platelets: 147 10*3/uL — ABNORMAL LOW (ref 150–400)
RBC: 2.52 MIL/uL — ABNORMAL LOW (ref 4.22–5.81)
RDW: 14.7 % (ref 11.5–15.5)
WBC: 4.2 10*3/uL (ref 4.0–10.5)
nRBC: 0 % (ref 0.0–0.2)

## 2021-03-03 LAB — BASIC METABOLIC PANEL
Anion gap: 5 (ref 5–15)
BUN: 30 mg/dL — ABNORMAL HIGH (ref 8–23)
CO2: 27 mmol/L (ref 22–32)
Calcium: 7.8 mg/dL — ABNORMAL LOW (ref 8.9–10.3)
Chloride: 107 mmol/L (ref 98–111)
Creatinine, Ser: 5.6 mg/dL — ABNORMAL HIGH (ref 0.61–1.24)
GFR, Estimated: 10 mL/min — ABNORMAL LOW (ref >60)
Glucose, Bld: 109 mg/dL — ABNORMAL HIGH (ref 70–99)
Potassium: 3.6 mmol/L (ref 3.5–5.1)
Sodium: 139 mmol/L (ref 135–145)

## 2021-03-03 LAB — MAGNESIUM: Magnesium: 1.8 mg/dL (ref 1.7–2.4)

## 2021-03-03 MED ORDER — DILTIAZEM HCL 30 MG PO TABS
60.0000 mg | ORAL_TABLET | Freq: Four times a day (QID) | ORAL | Status: DC
  Administered 2021-03-03 – 2021-03-04 (×5): 60 mg via ORAL
  Filled 2021-03-03 (×6): qty 2

## 2021-03-03 MED ORDER — EPOETIN ALFA 10000 UNIT/ML IJ SOLN
10000.0000 [IU] | INTRAMUSCULAR | Status: DC
  Administered 2021-03-04 – 2021-03-06 (×2): 10000 [IU] via INTRAVENOUS
  Filled 2021-03-03 (×2): qty 1

## 2021-03-03 MED ORDER — SODIUM CHLORIDE 0.9 % IV SOLN
250.0000 mg | Freq: Once | INTRAVENOUS | Status: AC
  Administered 2021-03-03: 18:00:00 250 mg via INTRAVENOUS
  Filled 2021-03-03: qty 20

## 2021-03-03 NOTE — Progress Notes (Signed)
Patient in Afib with RVR with HR from 160's to 170's. Hospitalist made aware. Received orders to transfer patient to Telemetry for Amio drip.

## 2021-03-03 NOTE — Progress Notes (Addendum)
[facility] Hospital Day(s): 3.   Post op day(s): 1 Day Post-Op.   Interval History: Patient seen and examined, no acute events or new complaints overnight. Patient reports teaspoon of clot with last bowel movement.  Has reasonable pain control. Constitutional: denies fever, chills  Respiratory: denies any shortness of breath  Cardiovascular: denies chest pain or palpitations  Gastrointestinal: denies abdominal pain, N/V, or diarrhea/and bowel function as per interval history Musculoskeletal: denies pain, decreased motor or sensation Integumentary: denies any other rashes or skin discolorations except  Vital signs in last 24 hours: [min-max] current  Temp:  [97.3 F (36.3 C)-98.5 F (36.9 C)] 98.2 F (36.8 C) (11/27 0808) Pulse Rate:  [63-114] 72 (11/27 0808) Resp:  [11-32] 18 (11/27 0808) BP: (95-139)/(60-89) 114/80 (11/27 0808) SpO2:  [95 %-100 %] 97 % (11/27 0808)     Height: 6\' 6"  (198.1 cm) Weight: 90.7 kg (standing) BMI (Calculated): 23.11   Intake/Output last 2 shifts:  11/26 0701 - 11/27 0700 In: 654.4 [P.O.:180; I.V.:474.4] Out: 375 [Drains:325; Blood:50]   Physical Exam:  Constitutional: alert, cooperative and no distress  Gastrointestinal: soft, non-tender, and non-distended  Labs:  CBC Latest Ref Rng & Units 03/03/2021 03/02/2021 03/01/2021  WBC 4.0 - 10.5 K/uL 4.2 3.9(L) 4.7  Hemoglobin 13.0 - 17.0 g/dL 7.4(L) 7.6(L) 7.7(L)  Hematocrit 39.0 - 52.0 % 22.7(L) 22.8(L) 23.2(L)  Platelets 150 - 400 K/uL 147(L) 141(L) 151   CMP Latest Ref Rng & Units 03/03/2021 03/02/2021 03/01/2021  Glucose 70 - 99 mg/dL 109(H) 116(H) 105(H)  BUN 8 - 23 mg/dL 30(H) 24(H) 36(H)  Creatinine 0.61 - 1.24 mg/dL 5.60(H) 4.38(H) 6.66(H)  Sodium 135 - 145 mmol/L 139 136 139  Potassium 3.5 - 5.1 mmol/L 3.6 3.5 3.6  Chloride 98 - 111 mmol/L 107 103 106  CO2 22 - 32 mmol/L 27 29 27   Calcium 8.9 - 10.3 mg/dL 7.8(L) 7.5(L) 7.7(L)  Total Protein 6.5  - 8.1 g/dL - - 4.9(L)  Total Bilirubin 0.3 - 1.2 mg/dL - - 0.7  Alkaline Phos 38 - 126 U/L - - 34(L)  AST 15 - 41 U/L - - 15  ALT 0 - 44 U/L - - 15     Imaging studies: No new pertinent imaging studies   Assessment/Plan:  80 y.o. male with  1 Day Post-Op s/p hemorrhoidectomy for bleeding, complicated by pertinent comorbidities including:  Patient Active Problem List   Diagnosis Date Noted   GI bleed 02/28/2021   Atrial fibrillation with RVR ([place]) 02/28/2021   Hypokalemia 02/28/2021   COVID-19 virus infection 02/28/2021   Hyperglycemia due to diabetes mellitus ([place]) 02/28/2021   History of cholecystitis 02/28/2021   End-stage renal disease on hemodialysis ([place]) 02/28/2021   Protein-calorie malnutrition, severe 02/23/2021   Symptomatic anemia 02/21/2021   Cholecystitis 01/24/2021   Weakness 01/23/2021   Elevated LFTs 01/23/2021   Transaminitis 01/23/2021   MGUS (monoclonal gammopathy of unknown significance)    Thrombocytopenia (HCC)    Acute anemia 11/23/2020   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   Chronic kidney disease 05/11/2019   Hyperlipidemia 05/11/2019   Hypertension, 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   Anemia due to vitamin B12 deficiency 05/05/2019   Benign prostatic hyperplasia 05/05/2019   Acute lower GI bleeding 04/27/2019   Acute blood loss anemia 04/27/2019   Acute kidney injury superimposed on CKD ([place]) 04/27/2019   Iron deficiency anemia due to chronic blood loss 04/27/2019   Type 2  diabetes mellitus with stage 4 chronic kidney disease, without long-term current use of insulin ([place]) 10/16/2015   Essential hypertension 10/16/2015   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  -Patient complains of limited options in his diet.  He figures his solution to get what he wants to eat will be to be discharged.  -Surgery has no objection to proceed with discharge to home.  -Follow-up with Dr. Dahlia Byes in 2 weeks or as needed.  All of the above findings and recommendations were discussed with the patient, and all of patient's questions were answered to his expressed satisfaction.  -- Ronny Bacon, M.D., [facility] 03/03/2021

## 2021-03-03 NOTE — TOC Initial Note (Addendum)
Transition of Care [facility]) - Initial/Assessment Note    Patient Details  Name: Gregory Dixon MRN: [id number] Date of Birth: [birth year]  Transition of Care [facility]) CM/SW Contact:    Magnus Ivan, LCSW Phone Number: 03/03/2021, 10:03 AM  Clinical Narrative:                Spoke with patient regarding DC planning. Patient lives with his wife who provides transportation. PCP is Dr. Kym Groom. Pharmacy is [facility]. Patient has a RW, w/c, and cane at home.  Patient is active with [facility] and OT services.   Expected Discharge Plan: [place] Barriers to Discharge: Continued Medical Work up   Patient Goals and CMS Choice Patient states their goals for this hospitalization and ongoing recovery are:: to return home CMS Medicare.gov Compare Post Acute Care list provided to:: Patient Choice offered to / list presented to : Patient  Expected Discharge Plan and Services Expected Discharge Plan: [place]       Living arrangements for the past 2 months: Single Family Home                                      Prior Living Arrangements/Services Living arrangements for the past 2 months: Single Family Home Lives with:: Spouse Patient language and need for interpreter reviewed:: Yes Do you feel safe going back to the place where you live?: Yes      Need for Family Participation in Patient Care: Yes (Comment) Care giver support system in place?: Yes (comment) Current home services: DME Criminal Activity/Legal Involvement Pertinent to Current Situation/Hospitalization: No - Comment as needed  Activities of Daily Living Home Assistive Devices/Equipment: Gilford Rile (specify type) ADL Screening (condition at time of admission) Patient's cognitive ability adequate to safely complete daily activities?: Yes Is the patient deaf or have difficulty hearing?: No Does the patient have difficulty seeing, even when wearing glasses/contacts?:  No Does the patient have difficulty concentrating, remembering, or making decisions?: No Patient able to express need for assistance with ADLs?: Yes Does the patient have difficulty dressing or bathing?: No Independently performs ADLs?: Yes (appropriate for developmental age) Does the patient have difficulty walking or climbing stairs?: Yes Weakness of Legs: Both Weakness of Arms/Hands: None  Permission Sought/Granted Permission sought to share information with : Facility Art therapist granted to share information with : Yes, Verbal Permission Granted     Permission granted to share info w AGENCY: [place], DME agencies        Emotional Assessment       Orientation: : Oriented to Self, Oriented to Place, Oriented to  Time, Oriented to Situation Alcohol / Substance Use: Not Applicable Psych Involvement: No (comment)  Admission diagnosis:  Weakness [R53.1] GI bleed [K92.2] Atrial fibrillation with normal ventricular rate (HCC) [I48.91] Symptomatic anemia [D64.9] Gastrointestinal hemorrhage, unspecified gastrointestinal hemorrhage type [K92.2] Patient Active Problem List   Diagnosis Date Noted   GI bleed 02/28/2021   Atrial fibrillation with RVR ([place]) 02/28/2021   Hypokalemia 02/28/2021   COVID-19 virus infection 02/28/2021   Hyperglycemia due to diabetes mellitus ([place]) 02/28/2021   History of cholecystitis 02/28/2021   End-stage renal disease on hemodialysis ([place]) 02/28/2021   Protein-calorie malnutrition, severe 02/23/2021   Symptomatic anemia 02/21/2021   Cholecystitis 01/24/2021   Weakness 01/23/2021   Elevated LFTs 01/23/2021   Transaminitis 01/23/2021   MGUS (monoclonal gammopathy of  unknown significance)    Thrombocytopenia (HCC)    Acute anemia 11/23/2020   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   Chronic kidney disease 05/11/2019   Hyperlipidemia 05/11/2019   Hypertension, 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    Anemia due to vitamin B12 deficiency 05/05/2019   Benign prostatic hyperplasia 05/05/2019   Acute lower GI bleeding 04/27/2019   Acute blood loss anemia 04/27/2019   Acute kidney injury superimposed on CKD ([place]) 04/27/2019   Iron deficiency anemia due to chronic blood loss 04/27/2019   Type 2 diabetes mellitus with stage 4 chronic kidney disease, without long-term current use of insulin ([place]) 10/16/2015   Essential hypertension 10/16/2015   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 [place] [address] Phone: [phone] Fax: [phone]     Social Determinants of Health ([place]) Interventions    Readmission Risk Interventions Readmission Risk Prevention Plan 03/03/2021 02/22/2021  Transportation Screening Complete Complete  PCP or Specialist Appt within 5-7 Days - Complete  Home Care Screening - (No Data)  Medication Review (RN CM) - Complete  Medication Review (RN Care Manager) Complete -  PCP or Specialist appointment within 3-5 days of discharge Complete -  [place] or Home Care Consult Complete -  SW Recovery Care/Counseling Consult Complete -  Palliative Care Screening Not Applicable -  [place] Not Applicable -  Some recent data might be hidden

## 2021-03-03 NOTE — Progress Notes (Signed)
Patient transfer from [facility], refusing bed alarm.   Bridgette Habermann DNP RN

## 2021-03-03 NOTE — Progress Notes (Signed)
PROGRESS NOTE    Gregory Dixon  HCW:[id number] DOB: July 10, 1940 DOA: 02/28/2021 PCP: Valera Castle, MD  [id number]   Assessment & Plan:   Principal Problem:   GI bleed Active Problems:   Acute blood loss anemia   Essential hypertension   Symptomatic anemia   Atrial fibrillation with RVR (HCC)   Hypokalemia   COVID-19 virus infection   Hyperglycemia due to diabetes mellitus ([place])   History of cholecystitis   End-stage renal disease on hemodialysis (HCC)   Gregory Dixon is a 80 y.o. male with medical history significant for type 2 diabetes mellitus, hypertension, ESRD on HD (MWF), chronic blood loss/GI bleed, MGUS, GERD, cholecystitis s/p IR percutaneous cholecystostomy placement who presents to the emergency department due to weakness and rectal bleeding.  Patient complained of 2-day onset of intermittent rectal bleeding with significant bright red blood per rectum.  Patient was noted to be very weak when he used the restroom this morning  by wife and he required assistance to prevent him from falling. Bright red blood was noted in the commode.  EMS was activated and patient was taken to the ED for further evaluation and management Patient endorsed chronic history of intermittent GI bleed, states that he received blood transfusion 2 weeks ago and also received blood transfusion last week.  Last colonoscopy was on Monday (11/21) by Dr. Allen Norris and there was no significant findings.    GI bleed 2/2 internal hemorrhoids in the setting of Eliquis S/p hemorrhoidectomy on 03/02/21 --fresh blood seen on the wipes and dripping after BM's, but no blood with stool. --No need for further GI scoping, per GI Plan: --monitor for bleeding --Follow-up with Dr. Dahlia Byes in 2 weeks or as needed.  Acute on chronic blood loss anemia Iron def --from rectal bleeding.  s/p 3u pRBC --anemia workup showed iron def Plan: --IV iron x1 today  A. fib with RVR --occurred in the ED and then after  hemorrhoidectomy. --started on amiodarone gtt due to soft BP Plan: --cont amiodarone gtt --cont home coreg --resume home cardizem as 60 q6h --hold home Eliquis due to GI bleed  Hypokalemia --monitor and replete PRN   Prolonged QTc (543 ms) Avoid QT prolonging drugs   Hypoalbuminemia  Severe malnutrition in context of chronic illness Albumin 2.3 --supplements per dietician   Hx of type II DM --A1c 5.6.  either DM2 is no longer active, or home 70/30 regimen was too aggressive. --BG with morning labs have been wnl --no need for fingersticks and SSI  Hx of Essential hypertension --BP has been soft --cont home coreg --resume home cardizem as 60 q6h  History of cholecystitis s/p percutaneous cholecystostomy drain Continue with drain flushing/care   Recent COVID-19 virus infection This was initially positive on 11/9 and finished quarantine on 11/20  ESRD on HD (MWF) --iHD per nephrology    DVT prophylaxis: SCD/Compression stockings Code Status: DNR  Family Communication:  Level of care: Progressive Dispo:   The patient is from: home Anticipated d/c is to: home Anticipated d/c date is: 1-2 days Patient currently is not medically ready to d/c due to: Afib RVR on amio gtt   Subjective and Interval History:  Pt went into Afib RVR in PACU after hemorrhoidectomy.  Was transferred to 2A for amiodarone gtt.  Pt reported rectal pain only with movement.  Pass some gas with 1 drop of blood coming out.     Objective: Vitals:   03/03/21 0103 03/03/21 0418 03/03/21 0808 03/03/21 1407  BP: 122/60 118/66  114/80 120/72  Pulse: 73 95 72 (!) 105  Resp: 16 19 18 18   Temp: 98.2 F (36.8 C) 98.5 F (36.9 C) 98.2 F (36.8 C) 97.7 F (36.5 C)  TempSrc: Oral  Oral   SpO2: 98% 100% 97% 99%  Weight:      Height:        Intake/Output Summary (Last 24 hours) at 03/03/2021 1544 Last data filed at 03/03/2021 1500 Gross per 24 hour  Intake 901.24 ml  Output 400 ml  Net 501.24  ml   Filed Weights   02/28/21 1025 03/01/21 1306 03/01/21 1628  Weight: 88.9 kg 91.1 kg 90.7 kg    Examination:   Constitutional: NAD, AAOx3, sitting up side of bed HEENT: conjunctivae and lids normal, EOMI CV: No cyanosis.   RESP: normal respiratory effort, on RA SKIN: warm, dry Neuro: II - XII grossly intact.     Data Reviewed: I have personally reviewed following labs and imaging studies  CBC: Recent Labs  Lab 02/28/21 1025 03/01/21 0439 03/01/21 1410 03/02/21 0525 03/03/21 0325  WBC 8.9 5.1 4.7 3.9* 4.2  HGB 7.2* 7.0* 7.7* 7.6* 7.4*  HCT 23.3* 21.5* 23.2* 22.8* 22.7*  MCV 93.6 88.8 88.9 88.7 90.1  PLT 191 151 151 141* 545*   Basic Metabolic Panel: Recent Labs  Lab 02/25/21 0319 02/28/21 1025 03/01/21 0439 03/02/21 0525 03/03/21 0325  NA 134* 137 139 136 139  K 3.5 3.4* 3.6 3.5 3.6  CL 103 106 106 103 107  CO2 24 20* 27 29 27   GLUCOSE 119* 134* 105* 116* 109*  BUN 40* 28* 36* 24* 30*  CREATININE 7.98* 5.70* 6.66* 4.38* 5.60*  CALCIUM 7.9* 7.7* 7.7* 7.5* 7.8*  MG  --  2.0 1.7 1.8 1.8  PHOS  --   --  4.4  --   --    GFR: Estimated Creatinine Clearance: 13.7 mL/min (A) (by C-G formula based on SCr of 5.6 mg/dL (H)). Liver Function Tests: Recent Labs  Lab 02/28/21 1025 03/01/21 0439  AST 29 15  ALT 18 15  ALKPHOS 43 34*  BILITOT 0.7 0.7  PROT 5.4* 4.9*  ALBUMIN 2.3* 2.2*   No results for input(s): LIPASE, AMYLASE in the last 168 hours. No results for input(s): AMMONIA in the last 168 hours. Coagulation Profile: Recent Labs  Lab 02/28/21 1025  INR 1.5*   Cardiac Enzymes: No results for input(s): CKTOTAL, CKMB, CKMBINDEX, TROPONINI in the last 168 hours. BNP (last 3 results) No results for input(s): PROBNP in the last 8760 hours. HbA1C: No results for input(s): HGBA1C in the last 72 hours. CBG: Recent Labs  Lab 02/25/21 0759 02/25/21 1410 03/01/21 2007 03/02/21 1632 03/02/21 1901  GLUCAP 115* 168* 182* 97 97   Lipid Profile: No  results for input(s): CHOL, HDL, LDLCALC, TRIG, CHOLHDL, LDLDIRECT in the last 72 hours. Thyroid Function Tests: No results for input(s): TSH, T4TOTAL, FREET4, T3FREE, THYROIDAB in the last 72 hours. Anemia Panel: No results for input(s): VITAMINB12, FOLATE, FERRITIN, TIBC, IRON, RETICCTPCT in the last 72 hours. Sepsis Labs: No results for input(s): PROCALCITON, LATICACIDVEN in the last 168 hours.  Recent Results (from the past 240 hour(s))  Resp Panel by RT-PCR (Flu A&B, Covid) Nasopharyngeal Swab     Status: Abnormal   Collection Time: 02/28/21 10:25 AM   Specimen: Nasopharyngeal Swab; Nasopharyngeal(NP) swabs in vial transport medium  Result Value Ref Range Status   SARS Coronavirus 2 by RT PCR POSITIVE (A) NEGATIVE Final    Comment: RESULT  CALLED TO, READ BACK BY AND VERIFIED WITH: MORGAN CATES RN @1142  02/28/21 SCS (NOTE) SARS-CoV-2 target nucleic acids are DETECTED.  The SARS-CoV-2 RNA is generally detectable in upper respiratory specimens during the acute phase of infection. Positive results are indicative of the presence of the identified virus, but do not rule out bacterial infection or co-infection with other pathogens not detected by the test. Clinical correlation with patient history and other diagnostic information is necessary to determine patient infection status. The expected result is Negative.  Fact Sheet for Patients: EntrepreneurPulse.com.au  Fact Sheet for Healthcare Providers: IncredibleEmployment.be  This test is not yet approved or cleared by the [place] FDA and  has been authorized for detection and/or diagnosis of SARS-CoV-2 by FDA under an Emergency Use Authorization (EUA).  This EUA will remain in effect (meaning this test can b e used) for the duration of  the COVID-19 declaration under Section 564(b)(1) of the Act, 21 U.S.C. section 360bbb-3(b)(1), unless the authorization is terminated or revoked  sooner.     Influenza A by PCR NEGATIVE NEGATIVE Final   Influenza B by PCR NEGATIVE NEGATIVE Final    Comment: (NOTE) The Xpert Xpress SARS-CoV-2/FLU/RSV plus assay is intended as an aid in the diagnosis of influenza from Nasopharyngeal swab specimens and should not be used as a sole basis for treatment. Nasal washings and aspirates are unacceptable for Xpert Xpress SARS-CoV-2/FLU/RSV testing.  Fact Sheet for Patients: EntrepreneurPulse.com.au  Fact Sheet for Healthcare Providers: IncredibleEmployment.be  This test is not yet approved or cleared by the [place] FDA and has been authorized for detection and/or diagnosis of SARS-CoV-2 by FDA under an Emergency Use Authorization (EUA). This EUA will remain in effect (meaning this test can be used) for the duration of the COVID-19 declaration under Section 564(b)(1) of the Act, 21 U.S.C. section 360bbb-3(b)(1), unless the authorization is terminated or revoked.  Performed at [facility], [address], [place], [place] [address]       Radiology Studies: No results found.   Scheduled Meds:  vitamin C  500 mg Oral BID   carvedilol  6.25 mg Oral BID WC   Chlorhexidine Gluconate Cloth  6 each Topical Daily   diltiazem  60 mg Oral Q6H   [START ON 03/04/2021] epoetin (EPOGEN/PROCRIT) injection  10,000 Units Intravenous Q M,W,F-HD   feeding supplement  1 Container Oral TID BM   feeding supplement (PROSource TF)  45 mL Per Tube TID   multivitamin  1 tablet Oral QHS   multivitamin with minerals  1 tablet Oral Daily   Continuous Infusions:  amiodarone 30 mg/hr (03/03/21 1414)   sodium chloride Stopped (02/28/21 1609)     LOS: 3 days     Enzo Bi, MD [facility] If 7PM-7AM, please contact night-coverage 03/03/2021, 3:44 PM

## 2021-03-03 NOTE — Progress Notes (Signed)
[facility]  ROUNDING NOTE   Subjective:   Gregory Dixon is a 80 year old male with past medical conditions including hypertension, diabetes, MGUS, GERD, GI bleed, cholecystitis with percutaneous cholecystostomy placement, ESRD on dialysis. He presents to the ED with bright red rectal bleeding for 2 days. He has been admitted for Weakness [R53.1] GI bleed [K92.2] Atrial fibrillation with normal ventricular rate (HCC) [I48.91] Symptomatic anemia [D64.9] Gastrointestinal hemorrhage, unspecified gastrointestinal hemorrhage type [K92.2]  Patient is known to our practice for multiple admission and received outpatient dialysis treatments at [facility] on MWF schedule, supervised by Dr Candiss Norse.  Patient seen resting comfortably in bed Tolerating meals, though complains about diet prescribed Denies pain and discomfort  Objective:  Vital signs in last 24 hours:  Temp:  [97.3 F (36.3 C)-98.5 F (36.9 C)] 97.7 F (36.5 C) (11/27 1407) Pulse Rate:  [63-114] 105 (11/27 1407) Resp:  [11-32] 18 (11/27 1407) BP: (95-139)/(60-89) 120/72 (11/27 1407) SpO2:  [95 %-100 %] 99 % (11/27 1407)  Weight change:  Filed Weights   02/28/21 1025 03/01/21 1306 03/01/21 1628  Weight: 88.9 kg 91.1 kg 90.7 kg    Intake/Output: I/O last 3 completed shifts: In: 654.4 [P.O.:180; I.V.:474.4] Out: 545 [Drains:495; Blood:50]   Intake/Output this shift:  Total I/O In: 0  Out: 150 [Drains:150]  Physical Exam: General: NAD, resting in bed  Head: Normocephalic, atraumatic. Moist oral mucosal membranes  Eyes: Anicteric  Lungs:  Clear to auscultation, normal effort  Heart: Regular rate and rhythm  Abdomen:  Soft, nontender,   Extremities: Trace peripheral edema.  Neurologic: Alert, moving all four extremities  Skin: No lesions  Access: Right IJ PermCath    Basic Metabolic Panel: Recent Labs  Lab 02/25/21 0319 02/28/21 1025 03/01/21 0439 03/02/21 0525 03/03/21 0325  NA 134* 137  139 136 139  K 3.5 3.4* 3.6 3.5 3.6  CL 103 106 106 103 107  CO2 24 20* 27 29 27   GLUCOSE 119* 134* 105* 116* 109*  BUN 40* 28* 36* 24* 30*  CREATININE 7.98* 5.70* 6.66* 4.38* 5.60*  CALCIUM 7.9* 7.7* 7.7* 7.5* 7.8*  MG  --  2.0 1.7 1.8 1.8  PHOS  --   --  4.4  --   --      Liver Function Tests: Recent Labs  Lab 02/28/21 1025 03/01/21 0439  AST 29 15  ALT 18 15  ALKPHOS 43 34*  BILITOT 0.7 0.7  PROT 5.4* 4.9*  ALBUMIN 2.3* 2.2*    No results for input(s): LIPASE, AMYLASE in the last 168 hours. No results for input(s): AMMONIA in the last 168 hours.  CBC: Recent Labs  Lab 02/28/21 1025 03/01/21 0439 03/01/21 1410 03/02/21 0525 03/03/21 0325  WBC 8.9 5.1 4.7 3.9* 4.2  HGB 7.2* 7.0* 7.7* 7.6* 7.4*  HCT 23.3* 21.5* 23.2* 22.8* 22.7*  MCV 93.6 88.8 88.9 88.7 90.1  PLT 191 151 151 141* 147*     Cardiac Enzymes: No results for input(s): CKTOTAL, CKMB, CKMBINDEX, TROPONINI in the last 168 hours.  BNP: Invalid input(s): POCBNP  CBG: Recent Labs  Lab 02/25/21 0759 02/25/21 1410 03/01/21 2007 03/02/21 1632 03/02/21 1901  GLUCAP 115* 168* 182* 97 97     Microbiology: Results for orders placed or performed during the hospital encounter of 02/28/21  Resp Panel by RT-PCR (Flu A&B, Covid) Nasopharyngeal Swab     Status: Abnormal   Collection Time: 02/28/21 10:25 AM   Specimen: Nasopharyngeal Swab; Nasopharyngeal(NP) swabs in vial transport medium  Result Value Ref Range Status   SARS Coronavirus 2 by RT PCR POSITIVE (A) NEGATIVE Final    Comment: RESULT CALLED TO, READ BACK BY AND VERIFIED WITH: MORGAN CATES RN @1142  02/28/21 SCS (NOTE) SARS-CoV-2 target nucleic acids are DETECTED.  The SARS-CoV-2 RNA is generally detectable in upper respiratory specimens during the acute phase of infection. Positive results are indicative of the presence of the identified virus, but do not rule out bacterial infection or co-infection with other pathogens not detected by  the test. Clinical correlation with patient history and other diagnostic information is necessary to determine patient infection status. The expected result is Negative.  Fact Sheet for Patients: EntrepreneurPulse.com.au  Fact Sheet for Healthcare Providers: IncredibleEmployment.be  This test is not yet approved or cleared by the [place] FDA and  has been authorized for detection and/or diagnosis of SARS-CoV-2 by FDA under an Emergency Use Authorization (EUA).  This EUA will remain in effect (meaning this test can b e used) for the duration of  the COVID-19 declaration under Section 564(b)(1) of the Act, 21 U.S.C. section 360bbb-3(b)(1), unless the authorization is terminated or revoked sooner.     Influenza A by PCR NEGATIVE NEGATIVE Final   Influenza B by PCR NEGATIVE NEGATIVE Final    Comment: (NOTE) The Xpert Xpress SARS-CoV-2/FLU/RSV plus assay is intended as an aid in the diagnosis of influenza from Nasopharyngeal swab specimens and should not be used as a sole basis for treatment. Nasal washings and aspirates are unacceptable for Xpert Xpress SARS-CoV-2/FLU/RSV testing.  Fact Sheet for Patients: EntrepreneurPulse.com.au  Fact Sheet for Healthcare Providers: IncredibleEmployment.be  This test is not yet approved or cleared by the [place] FDA and has been authorized for detection and/or diagnosis of SARS-CoV-2 by FDA under an Emergency Use Authorization (EUA). This EUA will remain in effect (meaning this test can be used) for the duration of the COVID-19 declaration under Section 564(b)(1) of the Act, 21 U.S.C. section 360bbb-3(b)(1), unless the authorization is terminated or revoked.  Performed at [facility], [address] [place], [place] [address]     Coagulation Studies: No results for input(s): LABPROT, INR in the last 72 hours.   Urinalysis: No results for  input(s): COLORURINE, LABSPEC, PHURINE, GLUCOSEU, HGBUR, BILIRUBINUR, KETONESUR, PROTEINUR, UROBILINOGEN, NITRITE, LEUKOCYTESUR in the last 72 hours.  Invalid input(s): APPERANCEUR    Imaging: No results found.   Medications:    amiodarone 30 mg/hr (03/03/21 1414)   sodium chloride Stopped (02/28/21 1609)    vitamin C  500 mg Oral BID   carvedilol  6.25 mg Oral BID WC   Chlorhexidine Gluconate Cloth  6 each Topical Daily   diltiazem  60 mg Oral Q6H   feeding supplement  1 Container Oral TID BM   feeding supplement (PROSource TF)  45 mL Per Tube TID   multivitamin  1 tablet Oral QHS   multivitamin with minerals  1 tablet Oral Daily   morphine injection, oxyCODONE  Assessment/ Plan:  Gregory Dixon is a 80 y.o.  male with past medical conditions including hypertension, diabetes, MGUS, GERD, GI bleed, cholecystitis with percutaneous cholecystostomy placement, ESRD on dialysis. He presents to the ED with bright red rectal bleeding for 2 days. He has been admitted for Weakness [R53.1] GI bleed [K92.2] Atrial fibrillation with normal ventricular rate (HCC) [I48.91] Symptomatic anemia [D64.9] Gastrointestinal hemorrhage, unspecified gastrointestinal hemorrhage type [K92.2]   [facility] [place]/MWF/Rt Permcath  End-stage renal disease requiring hemodialysis.  Will maintain outpatient scheduling if  possible. Next treatment scheduled for Monday.  2. Anemia of chronic kidney disease  Lab Results  Component Value Date   HGB 7.4 (L) 03/03/2021    Patient has received 3 units of blood this admission.  Mircera and Venofer are given outpatient. Surgery performed hemorrhoidectomy on 03/02/21. Will monitor hgb and give EPO during treatment.    3. Secondary Hyperparathyroidism: with outpatient labs: phosphorus 5.1, corrected calcium 8.7 on 02/18/21.   Lab Results  Component Value Date   CALCIUM 7.8 (L) 03/03/2021   PHOS 4.4 03/01/2021    Will continue to monitor one minerals  during this admission  4. Diabetes mellitus type II with chronic kidney disease: insulin dependent. Home regimen includes NPH. Most recent hemoglobin A1c is 5.6 on 01/25/21.  Primary team managing SSI  5.  Hypertension with chronic kidney disease.  Home regimen includes carvedilol and diltiazem.  All held. BP remains stable  6. Afib with RVR Amiodarone drip in place   LOS: 3 Gregory Dixon 11/27/20222:35 PM

## 2021-03-04 ENCOUNTER — Encounter: Payer: Self-pay | Admitting: Gastroenterology

## 2021-03-04 LAB — BPAM RBC
Blood Product Expiration Date: 202211302359
Blood Product Expiration Date: 202212212359
Blood Product Expiration Date: 202212212359
Blood Product Expiration Date: 202212282359
ISSUE DATE / TIME: 202211241247
ISSUE DATE / TIME: 202211241558
ISSUE DATE / TIME: 202211250841
Unit Type and Rh: 6200
Unit Type and Rh: 6200
Unit Type and Rh: 6200
Unit Type and Rh: 6200

## 2021-03-04 LAB — BASIC METABOLIC PANEL
Anion gap: 6 (ref 5–15)
BUN: 36 mg/dL — ABNORMAL HIGH (ref 8–23)
CO2: 26 mmol/L (ref 22–32)
Calcium: 7.8 mg/dL — ABNORMAL LOW (ref 8.9–10.3)
Chloride: 108 mmol/L (ref 98–111)
Creatinine, Ser: 6.71 mg/dL — ABNORMAL HIGH (ref 0.61–1.24)
GFR, Estimated: 8 mL/min — ABNORMAL LOW (ref >60)
Glucose, Bld: 118 mg/dL — ABNORMAL HIGH (ref 70–99)
Potassium: 4 mmol/L (ref 3.5–5.1)
Sodium: 140 mmol/L (ref 135–145)

## 2021-03-04 LAB — TYPE AND SCREEN
ABO/RH(D): AB POS
Antibody Screen: NEGATIVE
Unit division: 0
Unit division: 0
Unit division: 0
Unit division: 0

## 2021-03-04 LAB — CBC
HCT: 21.7 % — ABNORMAL LOW (ref 39.0–52.0)
Hemoglobin: 7 g/dL — ABNORMAL LOW (ref 13.0–17.0)
MCH: 29.5 pg (ref 26.0–34.0)
MCHC: 32.3 g/dL (ref 30.0–36.0)
MCV: 91.6 fL (ref 80.0–100.0)
Platelets: 145 10*3/uL — ABNORMAL LOW (ref 150–400)
RBC: 2.37 MIL/uL — ABNORMAL LOW (ref 4.22–5.81)
RDW: 15.1 % (ref 11.5–15.5)
WBC: 6.4 10*3/uL (ref 4.0–10.5)
nRBC: 0 % (ref 0.0–0.2)

## 2021-03-04 LAB — PREPARE RBC (CROSSMATCH)

## 2021-03-04 LAB — MAGNESIUM: Magnesium: 2 mg/dL (ref 1.7–2.4)

## 2021-03-04 MED ORDER — AMIODARONE HCL IN DEXTROSE 360-4.14 MG/200ML-% IV SOLN
30.0000 mg/h | INTRAVENOUS | Status: DC
  Administered 2021-03-04 – 2021-03-05 (×2): 30 mg/h via INTRAVENOUS
  Filled 2021-03-04: qty 200

## 2021-03-04 MED ORDER — AMIODARONE HCL IN DEXTROSE 360-4.14 MG/200ML-% IV SOLN
INTRAVENOUS | Status: AC
  Filled 2021-03-04: qty 200

## 2021-03-04 MED ORDER — DILTIAZEM HCL 30 MG PO TABS
90.0000 mg | ORAL_TABLET | Freq: Four times a day (QID) | ORAL | Status: DC
  Administered 2021-03-04 – 2021-03-06 (×6): 90 mg via ORAL
  Filled 2021-03-04 (×6): qty 3

## 2021-03-04 MED ORDER — EPOETIN ALFA 4000 UNIT/ML IJ SOLN
INTRAMUSCULAR | Status: AC
  Filled 2021-03-04: qty 3

## 2021-03-04 MED ORDER — PROSOURCE PLUS PO LIQD
30.0000 mL | Freq: Three times a day (TID) | ORAL | Status: DC
  Administered 2021-03-04: 22:00:00 30 mL via ORAL
  Filled 2021-03-04 (×7): qty 30

## 2021-03-04 MED ORDER — SODIUM CHLORIDE 0.9% IV SOLUTION: Freq: Once | INTRAVENOUS | Status: AC

## 2021-03-04 MED ORDER — HEPARIN SODIUM (PORCINE) 1000 UNIT/ML IJ SOLN
INTRAMUSCULAR | Status: AC
  Filled 2021-03-04: qty 10

## 2021-03-04 NOTE — Progress Notes (Addendum)
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  Ongoing  GOAL:   Patient will meet greater than or equal to 90% of their needs  Progressing   MONITOR:   PO intake, Supplement acceptance, Labs, Weight trends, Skin, I & O's  REASON FOR ASSESSMENT:   Malnutrition Screening Tool    ASSESSMENT:   80 y/o male with h/o ESRD on HD, HTN, Afib, DM, MGUS, cholecystitis status post IR percutaneous cholecystostomy placement 10/21 and recent admission for COVID 19 and GIB s/p EGD/colonscopy who is now admitted with recurrent GIB.  11/26- s/p Internal  hemorrhoidectomy two columns Posterior midline and Right posterolateral ( excision extends to the external component)  Reviewed I/O's: +587 ml x 24 hours and +2.4 L since admission  UOP: 0 ml x 24 hours  Drain output: 150 ml x 24 hours   Spoke with pt at bedside, who reports poor appetite secondary to poor taste in his mouth. Pt shares he was only able to eat a few bites of hamburger today because of this. Pt unable to tolerate oral nutrition supplements (Ensure, Nepro, Boost Breeze) due to poor taste in his mouth. He has had inadequate oral intake over the past 4-6 weeks due to this.   Pt endorses a 50# wt loss over the past few months. Reviewed wt hx; pt has  experienced a 11.7% wt loss over the past 3 months, which is significant for time frame.   Case discussed with MD regarding possibility about placing NGT for enteral nutrition support. Per MD, no plans to pursue this at this time, as pt is able to take PO's.   Labs reviewed: CBGS: 97-182.   NUTRITION - FOCUSED PHYSICAL EXAM:  Flowsheet Row Most Recent Value  Orbital Region Severe depletion  Upper Arm Region Severe depletion  Thoracic and Lumbar Region Moderate depletion  Buccal Region Severe depletion  Temple Region Severe depletion  Clavicle Bone Region Severe depletion  Clavicle and Acromion Bone Region Severe depletion  Scapular Bone Region Severe depletion  Dorsal Hand Mild depletion  Patellar Region Moderate depletion  Anterior Thigh Region Moderate depletion  Posterior Calf Region Moderate depletion  Edema (RD Assessment) None  Hair Reviewed  Eyes Reviewed  Mouth Reviewed  Skin Reviewed  Nails Reviewed       Diet Order:   Diet Order             Diet regular Room service appropriate? Yes; Fluid consistency: Thin  Diet effective now                   EDUCATION NEEDS:   Education needs have been addressed  Skin:  Skin Assessment: Skin Integrity Issues: Skin Integrity Issues:: Incisions Incisions: closed rectum  Last BM:  03/02/21  Height:   Ht Readings from Last 1 Encounters:  02/28/21 6\' 6"  (1.981 m)  Weight:   Wt Readings from Last 1 Encounters:  03/04/21 98 kg    Ideal Body Weight:  97 kg  BMI:  Body mass index is 24.96 kg/m.  Estimated Nutritional Needs:   Kcal:  3335-4562  Protein:  145-160 grams  Fluid:  1000 ml + UOP    Loistine Chance, RD, LDN, [place] Registered Dietitian II Certified Diabetes Care and Education Specialist Please refer to [facility] for RD and/or RD on-call/weekend/after hours pager

## 2021-03-04 NOTE — Care Management Important Message (Signed)
Important Message  Patient Details  Name: Gregory Dixon MRN: [id number] Date of Birth: 26-May-1940   Medicare Important Message Given:  Yes     Dannette Barbara 03/04/2021, 11:34 AM

## 2021-03-04 NOTE — Progress Notes (Signed)
PROGRESS NOTE    Gregory Dixon  FIE:[id number] DOB: [birth year] DOA: 02/28/2021 PCP: Valera Castle, MD  [id number]   Assessment & Plan:   Principal Problem:   GI bleed Active Problems:   Acute blood loss anemia   Essential hypertension   Symptomatic anemia   Atrial fibrillation with RVR (HCC)   Hypokalemia   COVID-19 virus infection   Hyperglycemia due to diabetes mellitus ([place])   History of cholecystitis   End-stage renal disease on hemodialysis (HCC)   Leyton Magoon is a 80 y.o. male with medical history significant for type 2 diabetes mellitus, hypertension, ESRD on HD (MWF), chronic blood loss/GI bleed, MGUS, GERD, cholecystitis s/p IR percutaneous cholecystostomy placement who presents to the emergency department due to weakness and rectal bleeding.  Patient complained of 2-day onset of intermittent rectal bleeding with significant bright red blood per rectum.  Patient was noted to be very weak when he used the restroom this morning  by wife and he required assistance to prevent him from falling. Bright red blood was noted in the commode.  EMS was activated and patient was taken to the ED for further evaluation and management Patient endorsed chronic history of intermittent GI bleed, states that he received blood transfusion 2 weeks ago and also received blood transfusion last week.  Last colonoscopy was on Monday (11/21) by Dr. Allen Norris and there was no significant findings.    GI bleed 2/2 internal hemorrhoids in the setting of Eliquis S/p hemorrhoidectomy on 03/02/21 --fresh blood seen on the wipes and dripping after BM's, but no blood with stool. --No need for further GI scoping, per GI Plan: --monitor for bleeding and Hgb --Follow-up with Dr. Dahlia Byes in 2 weeks or as needed.  Acute on chronic blood loss anemia Iron def --from rectal bleeding.  s/p 3u pRBC --anemia workup showed iron def, s/p IV iron x1 Plan: --1u pRBC today due to Hgb 7.0  A. fib with  RVR --occurred in the ED and then after hemorrhoidectomy. --started on amiodarone gtt due to soft BP Plan: --cont amiodarone gtt --cont home coreg --increase dilt to 90 q6h --hold home Eliquis due to GI bleed --Echo when rate more controlled  Hypokalemia --monitor and replete PRN   Prolonged QTc (543 ms) Avoid QT prolonging drugs   Hypoalbuminemia  Severe malnutrition in context of chronic illness Albumin 2.3 --pt has been refusing supplements.   --No indication for tube feed since pt has full capacity to eat and drink but chooses not to.  Hx of type II DM --A1c 5.6.  either DM2 is no longer active, or home 70/30 regimen was too aggressive. --BG with morning labs have been wnl --no need for fingersticks and SSI  Hx of Essential hypertension --BP has been soft --cont home coreg --dilt increased to 90 q6h for heart rate control  History of cholecystitis s/p percutaneous cholecystostomy drain Continue with drain flushing/care   Recent COVID-19 virus infection This was initially positive on 11/9 and finished quarantine on 11/20  ESRD on HD (MWF) --iHD per nephro    DVT prophylaxis: SCD/Compression stockings Code Status: DNR  Family Communication:  Level of care: Progressive Dispo:   The patient is from: home Anticipated d/c is to: home Anticipated d/c date is: 2-3 days Patient currently is not medically ready to d/c due to: Afib RVR on amio gtt   Subjective and Interval History:  Pt reported soreness in his rectal area.  No abdominal pain.    Still intermittently going into Afib  RVR.   Objective: Vitals:   03/04/21 1502 03/04/21 1504 03/04/21 1515 03/04/21 1517  BP:  134/60  105/65  Pulse:      Resp: 18 17 17 13   Temp:      TempSrc:      SpO2:  99%  100%  Weight:      Height:        Intake/Output Summary (Last 24 hours) at 03/04/2021 1526 Last data filed at 03/04/2021 1402 Gross per 24 hour  Intake 850.15 ml  Output --  Net 850.15 ml   Filed  Weights   03/01/21 1306 03/01/21 1628 03/04/21 1429  Weight: 91.1 kg 90.7 kg 98 kg    Examination:   Constitutional: NAD, AAOx3 HEENT: conjunctivae and lids normal, EOMI CV: No cyanosis.   RESP: normal respiratory effort, on RA GI: bili drain outputting brown fluid Extremities: No effusions, edema in BLE SKIN: warm, dry Neuro: II - XII grossly intact.   Psych: depressed mood and affect.  Appropriate judgement and reason   Data Reviewed: I have personally reviewed following labs and imaging studies  CBC: Recent Labs  Lab 03/01/21 0439 03/01/21 1410 03/02/21 0525 03/03/21 0325 03/04/21 0440  WBC 5.1 4.7 3.9* 4.2 6.4  HGB 7.0* 7.7* 7.6* 7.4* 7.0*  HCT 21.5* 23.2* 22.8* 22.7* 21.7*  MCV 88.8 88.9 88.7 90.1 91.6  PLT 151 151 141* 147* 979*   Basic Metabolic Panel: Recent Labs  Lab 02/28/21 1025 03/01/21 0439 03/02/21 0525 03/03/21 0325 03/04/21 0440  NA 137 139 136 139 140  K 3.4* 3.6 3.5 3.6 4.0  CL 106 106 103 107 108  CO2 20* 27 29 27 26   GLUCOSE 134* 105* 116* 109* 118*  BUN 28* 36* 24* 30* 36*  CREATININE 5.70* 6.66* 4.38* 5.60* 6.71*  CALCIUM 7.7* 7.7* 7.5* 7.8* 7.8*  MG 2.0 1.7 1.8 1.8 2.0  PHOS  --  4.4  --   --   --    GFR: Estimated Creatinine Clearance: 11.5 mL/min (A) (by C-G formula based on SCr of 6.71 mg/dL (H)). Liver Function Tests: Recent Labs  Lab 02/28/21 1025 03/01/21 0439  AST 29 15  ALT 18 15  ALKPHOS 43 34*  BILITOT 0.7 0.7  PROT 5.4* 4.9*  ALBUMIN 2.3* 2.2*   No results for input(s): LIPASE, AMYLASE in the last 168 hours. No results for input(s): AMMONIA in the last 168 hours. Coagulation Profile: Recent Labs  Lab 02/28/21 1025  INR 1.5*   Cardiac Enzymes: No results for input(s): CKTOTAL, CKMB, CKMBINDEX, TROPONINI in the last 168 hours. BNP (last 3 results) No results for input(s): PROBNP in the last 8760 hours. HbA1C: No results for input(s): HGBA1C in the last 72 hours. CBG: Recent Labs  Lab 03/01/21 2007  03/02/21 1632 03/02/21 1901  GLUCAP 182* 97 97   Lipid Profile: No results for input(s): CHOL, HDL, LDLCALC, TRIG, CHOLHDL, LDLDIRECT in the last 72 hours. Thyroid Function Tests: No results for input(s): TSH, T4TOTAL, FREET4, T3FREE, THYROIDAB in the last 72 hours. Anemia Panel: No results for input(s): VITAMINB12, FOLATE, FERRITIN, TIBC, IRON, RETICCTPCT in the last 72 hours. Sepsis Labs: No results for input(s): PROCALCITON, LATICACIDVEN in the last 168 hours.  Recent Results (from the past 240 hour(s))  Resp Panel by RT-PCR (Flu A&B, Covid) Nasopharyngeal Swab     Status: Abnormal   Collection Time: 02/28/21 10:25 AM   Specimen: Nasopharyngeal Swab; Nasopharyngeal(NP) swabs in vial transport medium  Result Value Ref Range Status   SARS  Coronavirus 2 by RT PCR POSITIVE (A) NEGATIVE Final    Comment: RESULT CALLED TO, READ BACK BY AND VERIFIED WITH: MORGAN CATES RN @1142  02/28/21 SCS (NOTE) SARS-CoV-2 target nucleic acids are DETECTED.  The SARS-CoV-2 RNA is generally detectable in upper respiratory specimens during the acute phase of infection. Positive results are indicative of the presence of the identified virus, but do not rule out bacterial infection or co-infection with other pathogens not detected by the test. Clinical correlation with patient history and other diagnostic information is necessary to determine patient infection status. The expected result is Negative.  Fact Sheet for Patients: EntrepreneurPulse.com.au  Fact Sheet for Healthcare Providers: IncredibleEmployment.be  This test is not yet approved or cleared by the [place] FDA and  has been authorized for detection and/or diagnosis of SARS-CoV-2 by FDA under an Emergency Use Authorization (EUA).  This EUA will remain in effect (meaning this test can b e used) for the duration of  the COVID-19 declaration under Section 564(b)(1) of the Act, 21 U.S.C. section  360bbb-3(b)(1), unless the authorization is terminated or revoked sooner.     Influenza A by PCR NEGATIVE NEGATIVE Final   Influenza B by PCR NEGATIVE NEGATIVE Final    Comment: (NOTE) The Xpert Xpress SARS-CoV-2/FLU/RSV plus assay is intended as an aid in the diagnosis of influenza from Nasopharyngeal swab specimens and should not be used as a sole basis for treatment. Nasal washings and aspirates are unacceptable for Xpert Xpress SARS-CoV-2/FLU/RSV testing.  Fact Sheet for Patients: EntrepreneurPulse.com.au  Fact Sheet for Healthcare Providers: IncredibleEmployment.be  This test is not yet approved or cleared by the [place] FDA and has been authorized for detection and/or diagnosis of SARS-CoV-2 by FDA under an Emergency Use Authorization (EUA). This EUA will remain in effect (meaning this test can be used) for the duration of the COVID-19 declaration under Section 564(b)(1) of the Act, 21 U.S.C. section 360bbb-3(b)(1), unless the authorization is terminated or revoked.  Performed at [facility], [address] [place], [place] [address]       Radiology Studies: No results found.   Scheduled Meds:  sodium chloride   Intravenous Once   vitamin C  500 mg Oral BID   carvedilol  6.25 mg Oral BID WC   Chlorhexidine Gluconate Cloth  6 each Topical Daily   diltiazem  60 mg Oral Q6H   epoetin alfa       epoetin (EPOGEN/PROCRIT) injection  10,000 Units Intravenous Q M,W,F-HD   feeding supplement  1 Container Oral TID BM   heparin sodium (porcine)       multivitamin  1 tablet Oral QHS   multivitamin with minerals  1 tablet Oral Daily   Continuous Infusions:  amiodarone 30 mg/hr (03/04/21 1321)     LOS: 4 days     Enzo Bi, MD [facility] If 7PM-7AM, please contact night-coverage 03/04/2021, 3:26 PM

## 2021-03-04 NOTE — Progress Notes (Signed)
[facility]  ROUNDING NOTE   Subjective:   Gregory Dixon is a 80 year old male with past medical conditions including hypertension, diabetes, MGUS, GERD, GI bleed, cholecystitis with percutaneous cholecystostomy placement, ESRD on dialysis. He presents to the ED with bright red rectal bleeding for 2 days. He has been admitted for Weakness [R53.1] GI bleed [K92.2] Atrial fibrillation with normal ventricular rate (HCC) [I48.91] Symptomatic anemia [D64.9] Gastrointestinal hemorrhage, unspecified gastrointestinal hemorrhage type [K92.2]  Patient is known to our practice for multiple admission and received outpatient dialysis treatments at [facility] on MWF schedule, supervised by Dr Candiss Norse.  Patient seen resting in bed, alert and oriented Reports no further bleeding since procedure Currently on amiodarone drip for heart rate control  Objective:  Vital signs in last 24 hours:  Temp:  [97.7 F (36.5 C)-98.6 F (37 C)] 98.6 F (37 C) (11/28 1131) Pulse Rate:  [68-105] 80 (11/28 1131) Resp:  [14-19] 17 (11/28 1131) BP: (97-131)/(59-83) 124/61 (11/28 1131) SpO2:  [94 %-99 %] 98 % (11/28 1131)  Weight change:  Filed Weights   02/28/21 1025 03/01/21 1306 03/01/21 1628  Weight: 88.9 kg 91.1 kg 90.7 kg    Intake/Output: I/O last 3 completed shifts: In: 971.4 [P.O.:60; I.V.:911.4] Out: 350 [Drains:350]   Intake/Output this shift:  Total I/O In: 240 [P.O.:240] Out: -   Physical Exam: General: NAD, resting in bed  Head: Normocephalic, atraumatic. Moist oral mucosal membranes  Eyes: Anicteric  Lungs:  Clear to auscultation, normal effort  Heart: Regular rate and rhythm  Abdomen:  Soft, nontender,   Extremities: No peripheral edema.  Neurologic: Alert, moving all four extremities  Skin: No lesions  Access: Right IJ PermCath    Basic Metabolic Panel: Recent Labs  Lab 02/28/21 1025 03/01/21 0439 03/02/21 0525 03/03/21 0325 03/04/21 0440  NA 137 139 136  139 140  K 3.4* 3.6 3.5 3.6 4.0  CL 106 106 103 107 108  CO2 20* 27 29 27 26   GLUCOSE 134* 105* 116* 109* 118*  BUN 28* 36* 24* 30* 36*  CREATININE 5.70* 6.66* 4.38* 5.60* 6.71*  CALCIUM 7.7* 7.7* 7.5* 7.8* 7.8*  MG 2.0 1.7 1.8 1.8 2.0  PHOS  --  4.4  --   --   --      Liver Function Tests: Recent Labs  Lab 02/28/21 1025 03/01/21 0439  AST 29 15  ALT 18 15  ALKPHOS 43 34*  BILITOT 0.7 0.7  PROT 5.4* 4.9*  ALBUMIN 2.3* 2.2*    No results for input(s): LIPASE, AMYLASE in the last 168 hours. No results for input(s): AMMONIA in the last 168 hours.  CBC: Recent Labs  Lab 03/01/21 0439 03/01/21 1410 03/02/21 0525 03/03/21 0325 03/04/21 0440  WBC 5.1 4.7 3.9* 4.2 6.4  HGB 7.0* 7.7* 7.6* 7.4* 7.0*  HCT 21.5* 23.2* 22.8* 22.7* 21.7*  MCV 88.8 88.9 88.7 90.1 91.6  PLT 151 151 141* 147* 145*     Cardiac Enzymes: No results for input(s): CKTOTAL, CKMB, CKMBINDEX, TROPONINI in the last 168 hours.  BNP: Invalid input(s): POCBNP  CBG: Recent Labs  Lab 02/25/21 1410 03/01/21 2007 03/02/21 1632 03/02/21 1901  GLUCAP 168* 182* 97 97     Microbiology: Results for orders placed or performed during the hospital encounter of 02/28/21  Resp Panel by RT-PCR (Flu A&B, Covid) Nasopharyngeal Swab     Status: Abnormal   Collection Time: 02/28/21 10:25 AM   Specimen: Nasopharyngeal Swab; Nasopharyngeal(NP) swabs in vial transport medium  Result Value Ref Range Status   SARS Coronavirus 2 by RT PCR POSITIVE (A) NEGATIVE Final    Comment: RESULT CALLED TO, READ BACK BY AND VERIFIED WITH: MORGAN CATES RN @1142  02/28/21 SCS (NOTE) SARS-CoV-2 target nucleic acids are DETECTED.  The SARS-CoV-2 RNA is generally detectable in upper respiratory specimens during the acute phase of infection. Positive results are indicative of the presence of the identified virus, but do not rule out bacterial infection or co-infection with other pathogens not detected by the test. Clinical  correlation with patient history and other diagnostic information is necessary to determine patient infection status. The expected result is Negative.  Fact Sheet for Patients: EntrepreneurPulse.com.au  Fact Sheet for Healthcare Providers: IncredibleEmployment.be  This test is not yet approved or cleared by the [place] FDA and  has been authorized for detection and/or diagnosis of SARS-CoV-2 by FDA under an Emergency Use Authorization (EUA).  This EUA will remain in effect (meaning this test can b e used) for the duration of  the COVID-19 declaration under Section 564(b)(1) of the Act, 21 U.S.C. section 360bbb-3(b)(1), unless the authorization is terminated or revoked sooner.     Influenza A by PCR NEGATIVE NEGATIVE Final   Influenza B by PCR NEGATIVE NEGATIVE Final    Comment: (NOTE) The Xpert Xpress SARS-CoV-2/FLU/RSV plus assay is intended as an aid in the diagnosis of influenza from Nasopharyngeal swab specimens and should not be used as a sole basis for treatment. Nasal washings and aspirates are unacceptable for Xpert Xpress SARS-CoV-2/FLU/RSV testing.  Fact Sheet for Patients: EntrepreneurPulse.com.au  Fact Sheet for Healthcare Providers: IncredibleEmployment.be  This test is not yet approved or cleared by the [place] FDA and has been authorized for detection and/or diagnosis of SARS-CoV-2 by FDA under an Emergency Use Authorization (EUA). This EUA will remain in effect (meaning this test can be used) for the duration of the COVID-19 declaration under Section 564(b)(1) of the Act, 21 U.S.C. section 360bbb-3(b)(1), unless the authorization is terminated or revoked.  Performed at [facility], [address] [place],  [address]     Coagulation Studies: No results for input(s): LABPROT, INR in the last 72 hours.   Urinalysis: No results for input(s): COLORURINE,  LABSPEC, PHURINE, GLUCOSEU, HGBUR, BILIRUBINUR, KETONESUR, PROTEINUR, UROBILINOGEN, NITRITE, LEUKOCYTESUR in the last 72 hours.  Invalid input(s): APPERANCEUR    Imaging: No results found.   Medications:    amiodarone      sodium chloride   Intravenous Once   vitamin C  500 mg Oral BID   carvedilol  6.25 mg Oral BID WC   Chlorhexidine Gluconate Cloth  6 each Topical Daily   diltiazem  60 mg Oral Q6H   epoetin alfa       epoetin (EPOGEN/PROCRIT) injection  10,000 Units Intravenous Q M,W,F-HD   feeding supplement  1 Container Oral TID BM   feeding supplement (PROSource TF)  45 mL Per Tube TID   heparin sodium (porcine)       multivitamin  1 tablet Oral QHS   multivitamin with minerals  1 tablet Oral Daily   morphine injection, oxyCODONE  Assessment/ Plan:  [name] is a 80 y.o.  male with past medical conditions including hypertension, diabetes, MGUS, GERD, GI bleed, cholecystitis with percutaneous cholecystostomy placement, ESRD on dialysis. He presents to the ED with bright red rectal bleeding for 2 days. He has been admitted for Weakness [R53.1] GI bleed [K92.2] Atrial fibrillation with normal ventricular rate (HCC) [I48.91] Symptomatic  anemia [D64.9] Gastrointestinal hemorrhage, unspecified gastrointestinal hemorrhage type [K92.2]   [facility] [place]/MWF/Rt Permcath  End-stage renal disease requiring hemodialysis.  Will maintain outpatient scheduling if possible.  Patient scheduled to receive dialysis this afternoon.  Due to amiodarone drip, dialysis will be completed at the bedside.  2. Anemia of chronic kidney disease  Lab Results  Component Value Date   HGB 7.0 (L) 03/04/2021    Patient has received 3 units of blood this admission.  Mircera and Venofer are given outpatient. Surgery performed hemorrhoidectomy on 03/02/21.  Hemoglobin continues to decrease since procedure.  Will defer need for transfusion to primary team.  We will continue EPO with  treatments   3. Secondary Hyperparathyroidism: with outpatient labs: phosphorus 5.1, corrected calcium 8.7 on 02/18/21.   Lab Results  Component Value Date   CALCIUM 7.8 (L) 03/04/2021   PHOS 4.4 03/01/2021    Phosphorus at goal.  Corrected calcium at 9.2.  We will continue to monitor bone minerals during this admission.  4. Diabetes mellitus type II with chronic kidney disease: insulin dependent. Home regimen includes NPH. Most recent hemoglobin A1c is 5.6 on 01/25/21.  Glucose stable  5.  Hypertension with chronic kidney disease.  Home regimen includes carvedilol and diltiazem.  Currently prescribed carvedilol and diltiazem.  BP and heart rate within acceptable range.  6. Afib with RVR Amiodarone drip remains in place.  Drip was stopped for a short time until rate and rhythm began to increase and become irregular.   LOS: [place] 11/28/20221:00 PM

## 2021-03-04 NOTE — Progress Notes (Signed)
Epic is stating blood transfusion flowsheet needs to be charted on but blood admin paperwork is missing from chart and this nurse is unable to document previous shifts blood administration.

## 2021-03-05 LAB — TYPE AND SCREEN
ABO/RH(D): AB POS
Antibody Screen: NEGATIVE
Unit division: 0

## 2021-03-05 LAB — BASIC METABOLIC PANEL
Anion gap: 7 (ref 5–15)
BUN: 21 mg/dL (ref 8–23)
CO2: 26 mmol/L (ref 22–32)
Calcium: 7.6 mg/dL — ABNORMAL LOW (ref 8.9–10.3)
Chloride: 100 mmol/L (ref 98–111)
Creatinine, Ser: 4.47 mg/dL — ABNORMAL HIGH (ref 0.61–1.24)
GFR, Estimated: 13 mL/min — ABNORMAL LOW (ref >60)
Glucose, Bld: 105 mg/dL — ABNORMAL HIGH (ref 70–99)
Potassium: 3.3 mmol/L — ABNORMAL LOW (ref 3.5–5.1)
Sodium: 133 mmol/L — ABNORMAL LOW (ref 135–145)

## 2021-03-05 LAB — CBC
HCT: 24 % — ABNORMAL LOW (ref 39.0–52.0)
Hemoglobin: 7.9 g/dL — ABNORMAL LOW (ref 13.0–17.0)
MCH: 29.3 pg (ref 26.0–34.0)
MCHC: 32.9 g/dL (ref 30.0–36.0)
MCV: 88.9 fL (ref 80.0–100.0)
Platelets: 146 10*3/uL — ABNORMAL LOW (ref 150–400)
RBC: 2.7 MIL/uL — ABNORMAL LOW (ref 4.22–5.81)
RDW: 16.8 % — ABNORMAL HIGH (ref 11.5–15.5)
WBC: 7.1 10*3/uL (ref 4.0–10.5)
nRBC: 0 % (ref 0.0–0.2)

## 2021-03-05 LAB — BPAM RBC
Blood Product Expiration Date: 202212082359
ISSUE DATE / TIME: 202211281519
Unit Type and Rh: 7300

## 2021-03-05 LAB — MAGNESIUM: Magnesium: 1.7 mg/dL (ref 1.7–2.4)

## 2021-03-05 LAB — SURGICAL PATHOLOGY

## 2021-03-05 NOTE — Progress Notes (Addendum)
[facility]  ROUNDING NOTE   Subjective:   Gregory Dixon is a 80 year old male with past medical conditions including hypertension, diabetes, MGUS, GERD, GI bleed, cholecystitis with percutaneous cholecystostomy placement, ESRD on dialysis. He presents to the ED with bright red rectal bleeding for 2 days. He has been admitted for Weakness [R53.1] GI bleed [K92.2] Atrial fibrillation with normal ventricular rate ([place]) [I48.91] Symptomatic anemia [D64.9] Gastrointestinal hemorrhage, unspecified gastrointestinal hemorrhage type [K92.2]  Patient seen resting No complaints at this time Denies shortness of breath or stomach upset  Objective:  Vital signs in last 24 hours:  Temp:  [97.9 F (36.6 C)-100.2 F (37.9 C)] 98.1 F (36.7 C) (11/29 0727) Pulse Rate:  [61-121] 68 (11/29 0727) Resp:  [13-33] 18 (11/29 0727) BP: (88-139)/(52-83) 112/59 (11/29 0727) SpO2:  [95 %-100 %] 96 % (11/29 0727) Weight:  [95.8 kg-98 kg] 96.5 kg (11/29 0057)  Weight change:  Filed Weights   03/04/21 1429 03/04/21 1812 03/05/21 0057  Weight: 98 kg 95.8 kg 96.5 kg    Intake/Output: I/O last 3 completed shifts: In: 1573.6 [P.O.:480; I.V.:673.6; Blood:420] Out: 1150 [Drains:150; Other:1000]   Intake/Output this shift:  Total I/O In: 307.4 [P.O.:240; I.V.:67.4] Out: 100 [Drains:100]  Physical Exam: General: NAD, resting in bed  Head: Normocephalic, atraumatic. Moist oral mucosal membranes  Eyes: Anicteric  Lungs:  Clear to auscultation, normal effort  Heart: Regular rate and rhythm  Abdomen:  Soft, nontender  Extremities: trace peripheral edema.  Neurologic: Alert, moving all four extremities  Skin: No lesions  Access: Right IJ PermCath  Right nephrostomy tube  Basic Metabolic Panel: Recent Labs  Lab 03/01/21 0439 03/02/21 0525 03/03/21 0325 03/04/21 0440 03/05/21 0305  NA 139 136 139 140 133*  K 3.6 3.5 3.6 4.0 3.3*  CL 106 103 107 108 100  CO2 27 29 27 26 26    GLUCOSE 105* 116* 109* 118* 105*  BUN 36* 24* 30* 36* 21  CREATININE 6.66* 4.38* 5.60* 6.71* 4.47*  CALCIUM 7.7* 7.5* 7.8* 7.8* 7.6*  MG 1.7 1.8 1.8 2.0 1.7  PHOS 4.4  --   --   --   --      Liver Function Tests: Recent Labs  Lab 02/28/21 1025 03/01/21 0439  AST 29 15  ALT 18 15  ALKPHOS 43 34*  BILITOT 0.7 0.7  PROT 5.4* 4.9*  ALBUMIN 2.3* 2.2*    No results for input(s): LIPASE, AMYLASE in the last 168 hours. No results for input(s): AMMONIA in the last 168 hours.  CBC: Recent Labs  Lab 03/01/21 1410 03/02/21 0525 03/03/21 0325 03/04/21 0440 03/05/21 0305  WBC 4.7 3.9* 4.2 6.4 7.1  HGB 7.7* 7.6* 7.4* 7.0* 7.9*  HCT 23.2* 22.8* 22.7* 21.7* 24.0*  MCV 88.9 88.7 90.1 91.6 88.9  PLT 151 141* 147* 145* 146*     Cardiac Enzymes: No results for input(s): CKTOTAL, CKMB, CKMBINDEX, TROPONINI in the last 168 hours.  BNP: Invalid input(s): POCBNP  CBG: Recent Labs  Lab 03/01/21 2007 03/02/21 1632 03/02/21 1901  GLUCAP 182* 97 97     Microbiology: Results for orders placed or performed during the hospital encounter of 02/28/21  Resp Panel by RT-PCR (Flu A&B, Covid) Nasopharyngeal Swab     Status: Abnormal   Collection Time: 02/28/21 10:25 AM   Specimen: Nasopharyngeal Swab; Nasopharyngeal(NP) swabs in vial transport medium  Result Value Ref Range Status   SARS Coronavirus 2 by RT PCR POSITIVE (A) NEGATIVE Final    Comment: RESULT  CALLED TO, READ BACK BY AND VERIFIED WITH: MORGAN CATES RN @1142  02/28/21 SCS (NOTE) SARS-CoV-2 target nucleic acids are DETECTED.  The SARS-CoV-2 RNA is generally detectable in upper respiratory specimens during the acute phase of infection. Positive results are indicative of the presence of the identified virus, but do not rule out bacterial infection or co-infection with other pathogens not detected by the test. Clinical correlation with patient history and other diagnostic information is necessary to determine  patient infection status. The expected result is Negative.  Fact Sheet for Patients: EntrepreneurPulse.com.au  Fact Sheet for Healthcare Providers: IncredibleEmployment.be  This test is not yet approved or cleared by the [place] FDA and  has been authorized for detection and/or diagnosis of SARS-CoV-2 by FDA under an Emergency Use Authorization (EUA).  This EUA will remain in effect (meaning this test can b e used) for the duration of  the COVID-19 declaration under Section 564(b)(1) of the Act, 21 U.S.C. section 360bbb-3(b)(1), unless the authorization is terminated or revoked sooner.     Influenza A by PCR NEGATIVE NEGATIVE Final   Influenza B by PCR NEGATIVE NEGATIVE Final    Comment: (NOTE) The Xpert Xpress SARS-CoV-2/FLU/RSV plus assay is intended as an aid in the diagnosis of influenza from Nasopharyngeal swab specimens and should not be used as a sole basis for treatment. Nasal washings and aspirates are unacceptable for Xpert Xpress SARS-CoV-2/FLU/RSV testing.  Fact Sheet for Patients: EntrepreneurPulse.com.au  Fact Sheet for Healthcare Providers: IncredibleEmployment.be  This test is not yet approved or cleared by the [place] FDA and has been authorized for detection and/or diagnosis of SARS-CoV-2 by FDA under an Emergency Use Authorization (EUA). This EUA will remain in effect (meaning this test can be used) for the duration of the COVID-19 declaration under Section 564(b)(1) of the Act, 21 U.S.C. section 360bbb-3(b)(1), unless the authorization is terminated or revoked.  Performed at [facility], [address] [place], [place] [address]     Coagulation Studies: No results for input(s): LABPROT, INR in the last 72 hours.   Urinalysis: No results for input(s): COLORURINE, LABSPEC, PHURINE, GLUCOSEU, HGBUR, BILIRUBINUR, KETONESUR, PROTEINUR, UROBILINOGEN, NITRITE,  LEUKOCYTESUR in the last 72 hours.  Invalid input(s): APPERANCEUR    Imaging: No results found.   Medications:    amiodarone 30 mg/hr (03/05/21 0149)    (feeding supplement) PROSource Plus  30 mL Oral TID BM   vitamin C  500 mg Oral BID   carvedilol  6.25 mg Oral BID WC   Chlorhexidine Gluconate Cloth  6 each Topical Daily   diltiazem  90 mg Oral Q6H   epoetin (EPOGEN/PROCRIT) injection  10,000 Units Intravenous Q M,W,F-HD   multivitamin  1 tablet Oral QHS   multivitamin with minerals  1 tablet Oral Daily   morphine injection, oxyCODONE  Assessment/ Plan:  [name] is a 80 y.o.  male with past medical conditions including hypertension, diabetes, MGUS, GERD, GI bleed, cholecystitis with percutaneous cholecystostomy placement, ESRD on dialysis. He presents to the ED with bright red rectal bleeding for 2 days. He has been admitted for Weakness [R53.1] GI bleed [K92.2] Atrial fibrillation with normal ventricular rate ([facility]) [I48.91] Symptomatic anemia [D64.9] Gastrointestinal hemorrhage, unspecified gastrointestinal hemorrhage type [K92.2]   [facility] [place]/MWF/Rt Permcath  End-stage renal disease requiring hemodialysis.  Will maintain outpatient scheduling if possible.  Patient received dialysis yesterday at bedside.  UF goal 1 L achieved.  Next treatment scheduled for Wednesday.  2. Anemia of chronic kidney disease  Lab Results  Component Value Date   HGB 7.9 (L) 03/05/2021    Patient has received 3 units of blood this admission.  Mircera and Venofer are given outpatient. Surgery performed hemorrhoidectomy on 03/02/21.    Hemoglobin remains low but improved.  Patient received 1 unit blood transfusion yesterday.  EPO ordered with dialysis treatments.  3. Secondary Hyperparathyroidism: with outpatient labs: phosphorus 5.1, corrected calcium 8.7 on 02/18/21.   Lab Results  Component Value Date   CALCIUM 7.6 (L) 03/05/2021   PHOS 4.4 03/01/2021    We will  continue to monitor bone minerals during this admission.  4. Diabetes mellitus type II with chronic kidney disease: insulin dependent. Home regimen includes NPH. Most recent hemoglobin A1c is 5.6 on 01/25/21.    5.  Hypertension with chronic kidney disease.  Home regimen includes carvedilol and diltiazem.  Currently prescribed carvedilol and diltiazem.  BP and heart rate within acceptable range.  6. Afib with RVR Amiodarone drip in place.     LOS: 5 Scarlett Portlock 11/29/202211:11 AM

## 2021-03-05 NOTE — Progress Notes (Signed)
PROGRESS NOTE    Gregory Dixon  MBW:[id number] DOB: 1940-12-03 DOA: 02/28/2021 PCP: Valera Castle, MD  [id number]   Assessment & Plan:   Principal Problem:   GI bleed Active Problems:   Acute blood loss anemia   Essential hypertension   Symptomatic anemia   Atrial fibrillation with RVR (HCC)   Hypokalemia   COVID-19 virus infection   Hyperglycemia due to diabetes mellitus ([place])   History of cholecystitis   End-stage renal disease on hemodialysis (HCC)   Gregory Dixon is a 80 y.o. male with medical history significant for type 2 diabetes mellitus, hypertension, ESRD on HD (MWF), chronic blood loss/GI bleed, MGUS, GERD, cholecystitis s/p IR percutaneous cholecystostomy placement who presents to the emergency department due to weakness and rectal bleeding.  Patient complained of 2-day onset of intermittent rectal bleeding with significant bright red blood per rectum.  Patient was noted to be very weak when he used the restroom this morning  by wife and he required assistance to prevent him from falling. Bright red blood was noted in the commode.  EMS was activated and patient was taken to the ED for further evaluation and management Patient endorsed chronic history of intermittent GI bleed, states that he received blood transfusion 2 weeks ago and also received blood transfusion last week.  Last colonoscopy was on Monday (11/21) by Dr. Allen Norris and there was no significant findings.    GI bleed 2/2 internal hemorrhoids in the setting of Eliquis S/p hemorrhoidectomy on 03/02/21 --fresh blood seen on the wipes and dripping after BM's, but no blood with stool. --No need for further GI scoping, per GI Plan: --monitor for bleeding and Hgb --Follow-up with Dr. Dahlia Byes in 2 weeks or as needed. --resume Eliquis after 2 more days, per Dr. Dahlia Byes  Acute on chronic blood loss anemia Iron def --from rectal bleeding.  s/p 4u pRBC total --anemia workup showed iron def, s/p IV iron  x1 Plan: --monitor Hgb  A. fib with RVR --occurred in the ED and then after hemorrhoidectomy. --started on amiodarone gtt due to soft BP Plan: --d/c amio gtt  --cont home coreg --cont dilt to 90 q6h today, transition to home 360 daily tomorrow --hold home Eliquis due to GI bleed, can resume after 2 more days --Echo after HR more controlled  Hypokalemia --monitor and replete PRN   Prolonged QTc (543 ms) Avoid QT prolonging drugs   Hypoalbuminemia  Severe malnutrition in context of chronic illness Albumin 2.3 --pt has been refusing supplements.   --No indication for tube feed since pt has full capacity to eat and drink but chooses not to.  Hx of type II DM --A1c 5.6.  either DM2 is no longer active, or home 70/30 regimen was too aggressive. --BG with morning labs have been wnl --no need for fingersticks and SSI  Hx of Essential hypertension --BP has been soft --cont home coreg --cont dilt 90 q6h today, resume home 360 mg daily tomorrow  History of cholecystitis s/p percutaneous cholecystostomy drain Continue with drain flushing/care   Recent COVID-19 virus infection This was initially positive on 11/9 and finished quarantine on 11/20  ESRD on HD (MWF) --iHD per nephro    DVT prophylaxis: SCD/Compression stockings Code Status: DNR  Family Communication:  Level of care: Progressive Dispo:   The patient is from: home Anticipated d/c is to: home Anticipated d/c date is: likely tomorrow Patient currently is not medically ready to d/c due to: monitor HR off of amio gtt   Subjective and  Interval History:  HR had been controlled since overnight.  Had 1 small BM with no gross blood.  Pt reported rectal pain improved.  Ate a little of each meal.  Reported everything tastes bad.   Objective: Vitals:   03/05/21 0727 03/05/21 1122 03/05/21 1540 03/05/21 1541  BP: (!) 112/59 110/70 (!) 124/46 120/67  Pulse: 68 68 74   Resp: 18 18 18    Temp: 98.1 F (36.7 C) 98.1  F (36.7 C) 98.2 F (36.8 C)   TempSrc:   Oral   SpO2: 96% 98% 99%   Weight:      Height:        Intake/Output Summary (Last 24 hours) at 03/05/2021 1955 Last data filed at 03/05/2021 1800 Gross per 24 hour  Intake 825.3 ml  Output 150 ml  Net 675.3 ml   Filed Weights   03/04/21 1429 03/04/21 1812 03/05/21 0057  Weight: 98 kg 95.8 kg 96.5 kg    Examination:   Constitutional: NAD, AAOx3 HEENT: conjunctivae and lids normal, EOMI CV: No cyanosis.   RESP: normal respiratory effort, on RA Extremities: No effusions, edema in BLE SKIN: warm, dry Neuro: II - XII grossly intact.   Psych: Normal mood and affect.  Appropriate judgement and reason   Data Reviewed: I have personally reviewed following labs and imaging studies  CBC: Recent Labs  Lab 03/01/21 1410 03/02/21 0525 03/03/21 0325 03/04/21 0440 03/05/21 0305  WBC 4.7 3.9* 4.2 6.4 7.1  HGB 7.7* 7.6* 7.4* 7.0* 7.9*  HCT 23.2* 22.8* 22.7* 21.7* 24.0*  MCV 88.9 88.7 90.1 91.6 88.9  PLT 151 141* 147* 145* 211*   Basic Metabolic Panel: Recent Labs  Lab 03/01/21 0439 03/02/21 0525 03/03/21 0325 03/04/21 0440 03/05/21 0305  NA 139 136 139 140 133*  K 3.6 3.5 3.6 4.0 3.3*  CL 106 103 107 108 100  CO2 27 29 27 26 26   GLUCOSE 105* 116* 109* 118* 105*  BUN 36* 24* 30* 36* 21  CREATININE 6.66* 4.38* 5.60* 6.71* 4.47*  CALCIUM 7.7* 7.5* 7.8* 7.8* 7.6*  MG 1.7 1.8 1.8 2.0 1.7  PHOS 4.4  --   --   --   --    GFR: Estimated Creatinine Clearance: 17.3 mL/min (A) (by C-G formula based on SCr of 4.47 mg/dL (H)). Liver Function Tests: Recent Labs  Lab 02/28/21 1025 03/01/21 0439  AST 29 15  ALT 18 15  ALKPHOS 43 34*  BILITOT 0.7 0.7  PROT 5.4* 4.9*  ALBUMIN 2.3* 2.2*   No results for input(s): LIPASE, AMYLASE in the last 168 hours. No results for input(s): AMMONIA in the last 168 hours. Coagulation Profile: Recent Labs  Lab 02/28/21 1025  INR 1.5*   Cardiac Enzymes: No results for input(s): CKTOTAL,  CKMB, CKMBINDEX, TROPONINI in the last 168 hours. BNP (last 3 results) No results for input(s): PROBNP in the last 8760 hours. HbA1C: No results for input(s): HGBA1C in the last 72 hours. CBG: Recent Labs  Lab 03/01/21 2007 03/02/21 1632 03/02/21 1901  GLUCAP 182* 97 97   Lipid Profile: No results for input(s): CHOL, HDL, LDLCALC, TRIG, CHOLHDL, LDLDIRECT in the last 72 hours. Thyroid Function Tests: No results for input(s): TSH, T4TOTAL, FREET4, T3FREE, THYROIDAB in the last 72 hours. Anemia Panel: No results for input(s): VITAMINB12, FOLATE, FERRITIN, TIBC, IRON, RETICCTPCT in the last 72 hours. Sepsis Labs: No results for input(s): PROCALCITON, LATICACIDVEN in the last 168 hours.  Recent Results (from the past 240 hour(s))  Resp  Panel by RT-PCR (Flu A&B, Covid) Nasopharyngeal Swab     Status: Abnormal   Collection Time: 02/28/21 10:25 AM   Specimen: Nasopharyngeal Swab; Nasopharyngeal(NP) swabs in vial transport medium  Result Value Ref Range Status   SARS Coronavirus 2 by RT PCR POSITIVE (A) NEGATIVE Final    Comment: RESULT CALLED TO, READ BACK BY AND VERIFIED WITH: MORGAN CATES RN @1142  02/28/21 SCS (NOTE) SARS-CoV-2 target nucleic acids are DETECTED.  The SARS-CoV-2 RNA is generally detectable in upper respiratory specimens during the acute phase of infection. Positive results are indicative of the presence of the identified virus, but do not rule out bacterial infection or co-infection with other pathogens not detected by the test. Clinical correlation with patient history and other diagnostic information is necessary to determine patient infection status. The expected result is Negative.  Fact Sheet for Patients: EntrepreneurPulse.com.au  Fact Sheet for Healthcare Providers: IncredibleEmployment.be  This test is not yet approved or cleared by the [place] FDA and  has been authorized for detection and/or diagnosis of  SARS-CoV-2 by FDA under an Emergency Use Authorization (EUA).  This EUA will remain in effect (meaning this test can b e used) for the duration of  the COVID-19 declaration under Section 564(b)(1) of the Act, 21 U.S.C. section 360bbb-3(b)(1), unless the authorization is terminated or revoked sooner.     Influenza A by PCR NEGATIVE NEGATIVE Final   Influenza B by PCR NEGATIVE NEGATIVE Final    Comment: (NOTE) The Xpert Xpress SARS-CoV-2/FLU/RSV plus assay is intended as an aid in the diagnosis of influenza from Nasopharyngeal swab specimens and should not be used as a sole basis for treatment. Nasal washings and aspirates are unacceptable for Xpert Xpress SARS-CoV-2/FLU/RSV testing.  Fact Sheet for Patients: EntrepreneurPulse.com.au  Fact Sheet for Healthcare Providers: IncredibleEmployment.be  This test is not yet approved or cleared by the [place] FDA and has been authorized for detection and/or diagnosis of SARS-CoV-2 by FDA under an Emergency Use Authorization (EUA). This EUA will remain in effect (meaning this test can be used) for the duration of the COVID-19 declaration under Section 564(b)(1) of the Act, 21 U.S.C. section 360bbb-3(b)(1), unless the authorization is terminated or revoked.  Performed at [facility], [address], [place], [place] [address]       Radiology Studies: No results found.   Scheduled Meds:  (feeding supplement) PROSource Plus  30 mL Oral TID BM   vitamin C  500 mg Oral BID   carvedilol  6.25 mg Oral BID WC   Chlorhexidine Gluconate Cloth  6 each Topical Daily   diltiazem  90 mg Oral Q6H   epoetin (EPOGEN/PROCRIT) injection  10,000 Units Intravenous Q M,W,F-HD   multivitamin  1 tablet Oral QHS   multivitamin with minerals  1 tablet Oral Daily   Continuous Infusions:  amiodarone Stopped (03/05/21 1311)     LOS: 5 days     Enzo Bi, MD [facility] If 7PM-7AM, please  contact night-coverage 03/05/2021, 7:55 PM

## 2021-03-06 ENCOUNTER — Inpatient Hospital Stay
Admit: 2021-03-06 | Discharge: 2021-03-06 | Disposition: A | Discharge status: Home / Self Care | Payer: Medicare Other | Attending: Hospitalist | Admitting: Hospitalist

## 2021-03-06 LAB — CBC
HCT: 24.7 % — ABNORMAL LOW (ref 39.0–52.0)
Hemoglobin: 8.2 g/dL — ABNORMAL LOW (ref 13.0–17.0)
MCH: 29.3 pg (ref 26.0–34.0)
MCHC: 33.2 g/dL (ref 30.0–36.0)
MCV: 88.2 fL (ref 80.0–100.0)
Platelets: 160 10*3/uL (ref 150–400)
RBC: 2.8 MIL/uL — ABNORMAL LOW (ref 4.22–5.81)
RDW: 16.2 % — ABNORMAL HIGH (ref 11.5–15.5)
WBC: 6.1 10*3/uL (ref 4.0–10.5)
nRBC: 0 % (ref 0.0–0.2)

## 2021-03-06 LAB — BASIC METABOLIC PANEL
Anion gap: 8 (ref 5–15)
BUN: 33 mg/dL — ABNORMAL HIGH (ref 8–23)
CO2: 25 mmol/L (ref 22–32)
Calcium: 7.9 mg/dL — ABNORMAL LOW (ref 8.9–10.3)
Chloride: 99 mmol/L (ref 98–111)
Creatinine, Ser: 6.2 mg/dL — ABNORMAL HIGH (ref 0.61–1.24)
GFR, Estimated: 9 mL/min — ABNORMAL LOW (ref >60)
Glucose, Bld: 111 mg/dL — ABNORMAL HIGH (ref 70–99)
Potassium: 3.2 mmol/L — ABNORMAL LOW (ref 3.5–5.1)
Sodium: 132 mmol/L — ABNORMAL LOW (ref 135–145)

## 2021-03-06 LAB — MAGNESIUM: Magnesium: 1.9 mg/dL (ref 1.7–2.4)

## 2021-03-06 MED ORDER — DILTIAZEM HCL ER COATED BEADS 180 MG PO CP24
360.0000 mg | ORAL_CAPSULE | Freq: Every day | ORAL | Status: DC
  Administered 2021-03-06: 360 mg via ORAL
  Filled 2021-03-06: qty 2

## 2021-03-06 MED ORDER — INSULIN NPH ISOPHANE & REGULAR (70-30) 100 UNIT/ML ~~LOC~~ SUSP: SUBCUTANEOUS | Status: AC

## 2021-03-06 MED ORDER — DILTIAZEM HCL ER COATED BEADS 360 MG PO CP24: 360.0000 mg | ORAL_CAPSULE | Freq: Every day | ORAL | 2 refills | Status: AC

## 2021-03-06 MED ORDER — HEPARIN SODIUM (PORCINE) 1000 UNIT/ML IJ SOLN
INTRAMUSCULAR | Status: AC
  Filled 2021-03-06: qty 10

## 2021-03-06 MED ORDER — CARVEDILOL 6.25 MG PO TABS: 6.2500 mg | ORAL_TABLET | Freq: Two times a day (BID) | ORAL | 2 refills | Status: AC

## 2021-03-06 MED ORDER — EPOETIN ALFA 10000 UNIT/ML IJ SOLN: 10000.0000 [IU] | INTRAMUSCULAR | Status: AC

## 2021-03-06 NOTE — Discharge Summary (Signed)
Physician Discharge Summary   Gregory Dixon  male DOB: [birth year]  UMP:[id number]  PCP: Valera Castle, MD  Admit date: 02/28/2021 Discharge date: 03/06/2021  Admitted From: home Disposition:  home Home Health: Yes CODE STATUS: DNR  Discharge Instructions     Discharge instructions   Complete by: As directed    You have had surgery to have your hemorrhoids removed.  Dr. Dahlia Byes said you can resume Eliquis on 03/07/21.  Your A1c is 5.6, and your blood sugars had been within normal limits during your hospitalization.  Please hold your insulin until outpatient followup with your PCP, as you may not need insulin anymore.  You went into Afib with high heart rate during this hospitalization, which may be due to your acute illness.  Rate controlled after amiodarone infusion.  I am discharging you back on your home coreg and Cardizem.  Please continue outpatient followup with cardiology.   Dr. Enzo Bi - -   No wound care   Complete by: As directed         Hospital Course:  For full details, please see H&P, progress notes, consult notes and ancillary notes.  Briefly,  Gregory Dixon is a 80 y.o. male with medical history significant for type 2 diabetes mellitus, hypertension, ESRD on HD (MWF), chronic blood loss/GI bleed, MGUS, cholecystitis s/p IR percutaneous cholecystostomy placement who presented to the emergency department due to weakness and rectal bleeding.    Patient endorsed chronic history of intermittent GI bleed, states that he received blood transfusion 2 weeks ago and also received blood transfusion last week.  Last colonoscopy was on Monday (11/21) by Dr. Allen Norris and there was no significant findings except for internal hemorrhoids.     GI bleed 2/2 internal hemorrhoids in the setting of Eliquis S/p hemorrhoidectomy on 03/02/21 --fresh blood seen on the wipes and dripping after BM's, but no blood with stool. --No need for further GI scoping, per GI --Pt  underwent hemorrhoidectomy with Dr. Dahlia Byes.  Post-op, pt had no more rectal bleeding.   --Follow-up with Dr. Dahlia Byes in 2 weeks or as needed. --ok to resume Eliquis on 03/07/21, per Dr. 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  s/p 4u pRBC total --anemia workup showed iron def, s/p IV iron x1 --Hgb stable ~8's after hemorrhoidectomy.   A. fib with RVR --occurred in the ED and then after hemorrhoidectomy, 2/2 missing doses of home rate control agents and stresses due to acute illness.   --started on amiodarone gtt on 11/27 due to soft BP, while continuing home coreg and dilt as short-acting.   --rate controlled and amio gtt stopped around noon on 11/29.   --pt discharged on home coreg 6.25 mg BID and cardizem 360 mg daily. --ok to resume Eliquis on 03/07/21, per Dr. Dahlia Byes  Hypokalemia --monitored and repleted PRN   Prolonged QTc (543 ms) Avoid QT prolonging drugs   Hypoalbuminemia  Severe malnutrition in context of chronic illness Albumin 2.3 --pt has been refusing supplements.   --No indication for tube feed since pt has full capacity to eat and drink but chooses not to.   Hx of type II DM --A1c 5.6.  either DM2 is no longer active, or home 70/30 regimen was too aggressive (pt said he takes insulin PRN, but based on BG after eating).   --BG with morning labs have been wnl --no need for fingersticks and SSI --Pt explained that BG checks need to be before meals.  Insulin held after discharge.  Hx of Essential hypertension --BP has been soft --cont home coreg and dilt.  History of cholecystitis s/p percutaneous cholecystostomy drain --Continue with drain flushing/care, and outpatient surgery f/u.   Recent COVID-19 virus infection, not currently active This was initially positive on 11/9 and finished quarantine on 11/20  ESRD on HD (MWF) --iHD per nephro   Discharge Diagnoses:  Principal Problem:   GI bleed Active Problems:   Acute blood  loss anemia   Essential hypertension   Symptomatic anemia   Atrial fibrillation with RVR (HCC)   Hypokalemia   COVID-19 virus infection   Hyperglycemia due to diabetes mellitus ([place])   History of cholecystitis   End-stage renal disease on hemodialysis ([place])   30 Day Unplanned Readmission Risk Score    Flowsheet Row ED to Hosp-Admission (Current) from 02/28/2021 in [facility] [facility]U  30 Day Unplanned Readmission Risk Score (%) 28.4 Filed at 03/06/2021 0801       This score is the patient's risk of an unplanned readmission within 30 days of being discharged (0 -100%). The score is based on dignosis, age, lab data, medications, orders, and past utilization.   Low:  0-14.9   Medium: 15-21.9   High: 22-29.9   Extreme: 30 and above         Discharge Instructions:  Allergies as of 03/06/2021       Reactions   Aspirin Other (See Comments)   GI bleed   Codeine Nausea Only, Nausea And Vomiting        Medication List     STOP taking these medications    ascorbic acid 500 MG tablet Commonly known as: VITAMIN C   pantoprazole 40 MG tablet Commonly known as: Protonix       TAKE these medications    apixaban 2.5 MG Tabs tablet Commonly known as: ELIQUIS Take 1 tablet (2.5 mg total) by mouth 2 (two) times daily.   carvedilol 6.25 MG tablet Commonly known as: COREG Take 1 tablet (6.25 mg total) by mouth 2 (two) times daily with a meal.   diltiazem 360 MG 24 hr capsule Commonly known as: CARDIZEM CD Take 1 capsule (360 mg total) by mouth daily.   epoetin alfa 10000 UNIT/ML injection Commonly known as: EPOGEN Inject 1 mL (10,000 Units total) into the vein every Monday, Wednesday, and Friday with hemodialysis.   fenofibrate 160 MG tablet Take 160 mg by mouth at bedtime.   ferrous sulfate 325 (65 FE) MG tablet Take 325 mg by mouth 2 (two) times daily with a meal.   insulin NPH-regular Human (70-30) 100 UNIT/ML injection Hold until followup with  PCP, as I don't think you need insulin anymore. What changed:  how much to take how to take this when to take this additional instructions   multivitamin capsule Take 1 capsule by mouth daily.   Vitamin D (Ergocalciferol) 1.25 MG (50000 UNIT) Caps capsule Commonly known as: DRISDOL Take 1 capsule (50,000 Units total) by mouth every 7 (seven) days.         Follow-up Information     Kym Groom Guy Begin, MD Follow up in 1 week(s).   Specialty: Family Medicine Contact information: [address] [place] [address] [phone]                 Allergies  Allergen Reactions   Aspirin Other (See Comments)    GI bleed   Codeine Nausea Only and Nausea And Vomiting  The results of significant diagnostics from this hospitalization (including imaging, microbiology, ancillary and laboratory) are listed below for reference.   Consultations:   Procedures/Studies: CT ABDOMEN PELVIS WO CONTRAST  Result Date: 02/20/2021 CLINICAL DATA:  Abdominal trauma syncope, fell and pulled cholecystostomy drain EXAM: CT ABDOMEN AND PELVIS WITHOUT CONTRAST TECHNIQUE: Multidetector CT imaging of the abdomen and pelvis was performed following the standard protocol without IV contrast. COMPARISON:  01/23/2021 FINDINGS: Lower chest: No acute abnormality. Hepatobiliary: Cholecystostomy drain remains in place in a decompressed gallbladder. 10 mm gallstone noted. No focal hepatic abnormality. Pancreas: No focal abnormality or ductal dilatation. Spleen: No focal abnormality.  Normal size. Adrenals/Urinary Tract: Bilateral renal cysts, the largest in the upper pole of the left kidney measuring 4 cm. 6 mm nonobstructing left lower pole renal stone. No hydronephrosis. Adrenal glands and urinary bladder unremarkable. Stomach/Bowel: Normal appendix. Stomach, large and small bowel grossly unremarkable. Vascular/Lymphatic: Aortic atherosclerosis. No evidence of aneurysm or adenopathy. Reproductive:  Prostate enlargement Other: No free fluid or free air. Musculoskeletal: No acute bony abnormality. IMPRESSION: No evidence of injury. Cholecystostomy tube remains in decompressed gallbladder. Cholelithiasis. Left nephrolithiasis.  No hydronephrosis. Aortic atherosclerosis. Prostate enlargement. Electronically Signed   By: Rolm Baptise M.D.   On: 02/20/2021 20:44   CT HEAD WO CONTRAST (5MM)  Result Date: 02/20/2021 CLINICAL DATA:  Fall, head injury EXAM: CT HEAD WITHOUT CONTRAST TECHNIQUE: Contiguous axial images were obtained from the base of the skull through the vertex without intravenous contrast. COMPARISON:  None. FINDINGS: Brain: Small hypodensity in the anterior limb internal capsule on the right likely of chronic ischemic insult. No other areas of infarction. No acute hemorrhage or mass. Ventricle size normal. Vascular: Negative for hyperdense vessel Skull: Negative Sinuses/Orbits: Mucosal edema paranasal sinuses. Bilateral cataract extraction Other: None IMPRESSION: No acute abnormality. Small hypodensity right internal capsule most consistent with chronic ischemia. Electronically Signed   By: Franchot Gallo M.D.   On: 02/20/2021 20:40   DG Chest Portable 1 View  Result Date: 02/20/2021 CLINICAL DATA:  Syncope EXAM: PORTABLE CHEST 1 VIEW COMPARISON:  01/23/2021 FINDINGS: Interval placement of right jugular central venous catheter with the tip in the lower SVC. No pneumothorax. Mild elevation right hemidiaphragm unchanged. Minimal right lower lobe atelectasis. Left lung clear. Negative for heart failure edema or effusion. IMPRESSION: Elevated right hemidiaphragm with mild right lower lobe atelectasis Central venous catheter tip in the SVC without pneumothorax. Electronically Signed   By: Franchot Gallo M.D.   On: 02/20/2021 20:36      Labs: BNP (last 3 results) No results for input(s): BNP in the last 8760 hours. Basic Metabolic Panel: Recent Labs  Lab 03/01/21 0439 03/02/21 0525  03/03/21 0325 03/04/21 0440 03/05/21 0305 03/06/21 0428  NA 139 136 139 140 133* 132*  K 3.6 3.5 3.6 4.0 3.3* 3.2*  CL 106 103 107 108 100 99  CO2 27 29 27 26 26 25   GLUCOSE 105* 116* 109* 118* 105* 111*  BUN 36* 24* 30* 36* 21 33*  CREATININE 6.66* 4.38* 5.60* 6.71* 4.47* 6.20*  CALCIUM 7.7* 7.5* 7.8* 7.8* 7.6* 7.9*  MG 1.7 1.8 1.8 2.0 1.7 1.9  PHOS 4.4  --   --   --   --   --    Liver Function Tests: Recent Labs  Lab 02/28/21 1025 03/01/21 0439  AST 29 15  ALT 18 15  ALKPHOS 43 34*  BILITOT 0.7 0.7  PROT 5.4* 4.9*  ALBUMIN 2.3* 2.2*   No results for  input(s): LIPASE, AMYLASE in the last 168 hours. No results for input(s): AMMONIA in the last 168 hours. CBC: Recent Labs  Lab 03/02/21 0525 03/03/21 0325 03/04/21 0440 03/05/21 0305 03/06/21 0428  WBC 3.9* 4.2 6.4 7.1 6.1  HGB 7.6* 7.4* 7.0* 7.9* 8.2*  HCT 22.8* 22.7* 21.7* 24.0* 24.7*  MCV 88.7 90.1 91.6 88.9 88.2  PLT 141* 147* 145* 146* 160   Cardiac Enzymes: No results for input(s): CKTOTAL, CKMB, CKMBINDEX, TROPONINI in the last 168 hours. BNP: Invalid input(s): POCBNP CBG: Recent Labs  Lab 03/01/21 2007 03/02/21 1632 03/02/21 1901  GLUCAP 182* 97 97   D-Dimer No results for input(s): DDIMER in the last 72 hours. Hgb A1c No results for input(s): HGBA1C in the last 72 hours. Lipid Profile No results for input(s): CHOL, HDL, LDLCALC, TRIG, CHOLHDL, LDLDIRECT in the last 72 hours. Thyroid function studies No results for input(s): TSH, T4TOTAL, T3FREE, THYROIDAB in the last 72 hours.  Invalid input(s): FREET3 Anemia work up No results for input(s): VITAMINB12, FOLATE, FERRITIN, TIBC, IRON, RETICCTPCT in the last 72 hours. Urinalysis    Component Value Date/Time   COLORURINE YELLOW (A) 04/27/2019 2100   APPEARANCEUR Clear 06/28/2020 0900   LABSPEC 1.011 04/27/2019 2100   PHURINE 6.0 04/27/2019 2100   GLUCOSEU Trace (A) 06/28/2020 0900   HGBUR NEGATIVE 04/27/2019 2100   BILIRUBINUR Negative  06/28/2020 0900   KETONESUR NEGATIVE 04/27/2019 2100   PROTEINUR 3+ (A) 06/28/2020 0900   PROTEINUR >=300 (A) 04/27/2019 2100   NITRITE Negative 06/28/2020 0900   NITRITE NEGATIVE 04/27/2019 2100   LEUKOCYTESUR Negative 06/28/2020 0900   LEUKOCYTESUR NEGATIVE 04/27/2019 2100   Sepsis Labs Invalid input(s): PROCALCITONIN,  WBC,  LACTICIDVEN Microbiology Recent Results (from the past 240 hour(s))  Resp Panel by RT-PCR (Flu A&B, Covid) Nasopharyngeal Swab     Status: Abnormal   Collection Time: 02/28/21 10:25 AM   Specimen: Nasopharyngeal Swab; Nasopharyngeal(NP) swabs in vial transport medium  Result Value Ref Range Status   SARS Coronavirus 2 by RT PCR POSITIVE (A) NEGATIVE Final    Comment: RESULT CALLED TO, READ BACK BY AND VERIFIED WITH: MORGAN CATES RN @1142  02/28/21 SCS (NOTE) SARS-CoV-2 target nucleic acids are DETECTED.  The SARS-CoV-2 RNA is generally detectable in upper respiratory specimens during the acute phase of infection. Positive results are indicative of the presence of the identified virus, but do not rule out bacterial infection or co-infection with other pathogens not detected by the test. Clinical correlation with patient history and other diagnostic information is necessary to determine patient infection status. The expected result is Negative.  Fact Sheet for Patients: EntrepreneurPulse.com.au  Fact Sheet for Healthcare Providers: IncredibleEmployment.be  This test is not yet approved or cleared by the [place] FDA and  has been authorized for detection and/or diagnosis of SARS-CoV-2 by FDA under an Emergency Use Authorization (EUA).  This EUA will remain in effect (meaning this test can b e used) for the duration of  the COVID-19 declaration under Section 564(b)(1) of the Act, 21 U.S.C. section 360bbb-3(b)(1), unless the authorization is terminated or revoked sooner.     Influenza A by PCR NEGATIVE NEGATIVE  Final   Influenza B by PCR NEGATIVE NEGATIVE Final    Comment: (NOTE) The Xpert Xpress SARS-CoV-2/FLU/RSV plus assay is intended as an aid in the diagnosis of influenza from Nasopharyngeal swab specimens and should not be used as a sole basis for treatment. Nasal washings and aspirates are unacceptable for Xpert Xpress SARS-CoV-2/FLU/RSV testing.  Fact  Sheet for Patients: EntrepreneurPulse.com.au  Fact Sheet for Healthcare Providers: IncredibleEmployment.be  This test is not yet approved or cleared by the [place] FDA and has been authorized for detection and/or diagnosis of SARS-CoV-2 by FDA under an Emergency Use Authorization (EUA). This EUA will remain in effect (meaning this test can be used) for the duration of the COVID-19 declaration under Section 564(b)(1) of the Act, 21 U.S.C. section 360bbb-3(b)(1), unless the authorization is terminated or revoked.  Performed at [facility], [address] [place], [place] [address]      Total time spend on discharging this patient, including the last patient exam, discussing the hospital stay, instructions for ongoing care as it relates to all pertinent caregivers, as well as preparing the medical discharge records, prescriptions, and/or referrals as applicable, is 40 minutes.    Enzo Bi, MD  [facility] 03/06/2021, 9:26 AM

## 2021-03-06 NOTE — Progress Notes (Signed)
[facility]  ROUNDING NOTE   Subjective:   Gregory Dixon is a 80 year old male with past medical conditions including hypertension, diabetes, MGUS, GERD, GI bleed, cholecystitis with percutaneous cholecystostomy placement, ESRD on dialysis. He presents to the ED with bright red rectal bleeding for 2 days. He has been admitted for Weakness [R53.1] GI bleed [K92.2] Atrial fibrillation with normal ventricular rate (HCC) [I48.91] Symptomatic anemia [D64.9] Gastrointestinal hemorrhage, unspecified gastrointestinal hemorrhage type [K92.2]  Patient seen and evaluated during dialysis   HEMODIALYSIS FLOWSHEET:  Blood Flow Rate (mL/min): 400 mL/min Arterial Pressure (mmHg): -160 mmHg Venous Pressure (mmHg): 130 mmHg Transmembrane Pressure (mmHg): 60 mmHg Ultrafiltration Rate (mL/min): 500 mL/min Dialysate Flow Rate (mL/min): 500 ml/min Conductivity: Machine : 14 Conductivity: Machine : 14 Dialysis Fluid Bolus: Normal Saline Bolus Amount (mL): 250 mL Dialysate Change:  (bath changed from 4k to 3k)  Tolerating treatment well No complaints at this time  Objective:  Vital signs in last 24 hours:  Temp:  [98.2 F (36.8 C)-99.5 F (37.5 C)] 99.2 F (37.3 C) (11/30 1042) Pulse Rate:  [50-107] 70 (11/30 1315) Resp:  [15-21] 17 (11/30 1315) BP: (90-133)/(46-78) 114/54 (11/30 1315) SpO2:  [96 %-100 %] 100 % (11/30 1315) Weight:  [92.8 kg-93.7 kg] 92.8 kg (11/30 1042)  Weight change: -4.309 kg Filed Weights   03/05/21 0057 03/06/21 0506 03/06/21 1042  Weight: 96.5 kg 93.7 kg 92.8 kg    Intake/Output: I/O last 3 completed shifts: In: 825.3 [P.O.:480; I.V.:345.3] Out: 150 [Drains:150]   Intake/Output this shift:  Total I/O In: 240 [P.O.:240] Out: 200 [Drains:200]  Physical Exam: General: NAD, resting in bed  Head: Normocephalic, atraumatic. Moist oral mucosal membranes  Eyes: Anicteric  Lungs:  Clear to auscultation, normal effort  Heart: Regular rate and rhythm   Abdomen:  Soft, nontender  Extremities: trace peripheral edema.  Neurologic: Alert, moving all four extremities  Skin: No lesions  Access: Right IJ PermCath  Right nephrostomy tube  Basic Metabolic Panel: Recent Labs  Lab 03/01/21 0439 03/02/21 0525 03/03/21 0325 03/04/21 0440 03/05/21 0305 03/06/21 0428  NA 139 136 139 140 133* 132*  K 3.6 3.5 3.6 4.0 3.3* 3.2*  CL 106 103 107 108 100 99  CO2 27 29 27 26 26 25   GLUCOSE 105* 116* 109* 118* 105* 111*  BUN 36* 24* 30* 36* 21 33*  CREATININE 6.66* 4.38* 5.60* 6.71* 4.47* 6.20*  CALCIUM 7.7* 7.5* 7.8* 7.8* 7.6* 7.9*  MG 1.7 1.8 1.8 2.0 1.7 1.9  PHOS 4.4  --   --   --   --   --      Liver Function Tests: Recent Labs  Lab 02/28/21 1025 03/01/21 0439  AST 29 15  ALT 18 15  ALKPHOS 43 34*  BILITOT 0.7 0.7  PROT 5.4* 4.9*  ALBUMIN 2.3* 2.2*    No results for input(s): LIPASE, AMYLASE in the last 168 hours. No results for input(s): AMMONIA in the last 168 hours.  CBC: Recent Labs  Lab 03/02/21 0525 03/03/21 0325 03/04/21 0440 03/05/21 0305 03/06/21 0428  WBC 3.9* 4.2 6.4 7.1 6.1  HGB 7.6* 7.4* 7.0* 7.9* 8.2*  HCT 22.8* 22.7* 21.7* 24.0* 24.7*  MCV 88.7 90.1 91.6 88.9 88.2  PLT 141* 147* 145* 146* 160     Cardiac Enzymes: No results for input(s): CKTOTAL, CKMB, CKMBINDEX, TROPONINI in the last 168 hours.  BNP: Invalid input(s): POCBNP  CBG: Recent Labs  Lab 03/01/21 2007 03/02/21 1632 03/02/21 1901  GLUCAP 182*  20 97     Microbiology: Results for orders placed or performed during the hospital encounter of 02/28/21  Resp Panel by RT-PCR (Flu A&B, Covid) Nasopharyngeal Swab     Status: Abnormal   Collection Time: 02/28/21 10:25 AM   Specimen: Nasopharyngeal Swab; Nasopharyngeal(NP) swabs in vial transport medium  Result Value Ref Range Status   SARS Coronavirus 2 by RT PCR POSITIVE (A) NEGATIVE Final    Comment: RESULT CALLED TO, READ BACK BY AND VERIFIED WITH: MORGAN CATES RN @1142  02/28/21  SCS (NOTE) SARS-CoV-2 target nucleic acids are DETECTED.  The SARS-CoV-2 RNA is generally detectable in upper respiratory specimens during the acute phase of infection. Positive results are indicative of the presence of the identified virus, but do not rule out bacterial infection or co-infection with other pathogens not detected by the test. Clinical correlation with patient history and other diagnostic information is necessary to determine patient infection status. The expected result is Negative.  Fact Sheet for Patients: EntrepreneurPulse.com.au  Fact Sheet for Healthcare Providers: IncredibleEmployment.be  This test is not yet approved or cleared by the [place] FDA and  has been authorized for detection and/or diagnosis of SARS-CoV-2 by FDA under an Emergency Use Authorization (EUA).  This EUA will remain in effect (meaning this test can b e used) for the duration of  the COVID-19 declaration under Section 564(b)(1) of the Act, 21 U.S.C. section 360bbb-3(b)(1), unless the authorization is terminated or revoked sooner.     Influenza A by PCR NEGATIVE NEGATIVE Final   Influenza B by PCR NEGATIVE NEGATIVE Final    Comment: (NOTE) The Xpert Xpress SARS-CoV-2/FLU/RSV plus assay is intended as an aid in the diagnosis of influenza from Nasopharyngeal swab specimens and should not be used as a sole basis for treatment. Nasal washings and aspirates are unacceptable for Xpert Xpress SARS-CoV-2/FLU/RSV testing.  Fact Sheet for Patients: EntrepreneurPulse.com.au  Fact Sheet for Healthcare Providers: IncredibleEmployment.be  This test is not yet approved or cleared by the [place] FDA and has been authorized for detection and/or diagnosis of SARS-CoV-2 by FDA under an Emergency Use Authorization (EUA). This EUA will remain in effect (meaning this test can be used) for the duration of the COVID-19  declaration under Section 564(b)(1) of the Act, 21 U.S.C. section 360bbb-3(b)(1), unless the authorization is terminated or revoked.  Performed at [facility], [address] [place], [place] [address]     Coagulation Studies: No results for input(s): LABPROT, INR in the last 72 hours.   Urinalysis: No results for input(s): COLORURINE, LABSPEC, PHURINE, GLUCOSEU, HGBUR, BILIRUBINUR, KETONESUR, PROTEINUR, UROBILINOGEN, NITRITE, LEUKOCYTESUR in the last 72 hours.  Invalid input(s): APPERANCEUR    Imaging: No results found.   Medications:    amiodarone Stopped (03/05/21 1311)    (feeding supplement) PROSource Plus  30 mL Oral TID BM   vitamin C  500 mg Oral BID   carvedilol  6.25 mg Oral BID WC   Chlorhexidine Gluconate Cloth  6 each Topical Daily   diltiazem  360 mg Oral Daily   epoetin (EPOGEN/PROCRIT) injection  10,000 Units Intravenous Q M,W,F-HD   multivitamin  1 tablet Oral QHS   multivitamin with minerals  1 tablet Oral Daily   morphine injection, oxyCODONE  Assessment/ Plan:  [name] is a 80 y.o.  male with past medical conditions including hypertension, diabetes, MGUS, GERD, GI bleed, cholecystitis with percutaneous cholecystostomy placement, ESRD on dialysis. He presents to the ED with bright red rectal bleeding for 2  days. He has been admitted for Weakness [R53.1] GI bleed [K92.2] Atrial fibrillation with normal ventricular rate (HCC) [I48.91] Symptomatic anemia [D64.9] Gastrointestinal hemorrhage, unspecified gastrointestinal hemorrhage type [K92.2]   [facility]/MWF/Rt Permcath  End-stage renal disease requiring hemodialysis.  Will maintain outpatient scheduling if possible.  Receiving dialysis at this time. Tolerating well. UF goal 1L as tolerated. Next treatment scheduled for Friday.  2. Anemia of chronic kidney disease  Lab Results  Component Value Date   HGB 8.2 (L) 03/06/2021    Patient has received 3 units of blood  this admission.  Mircera and Venofer are given outpatient. Surgery performed hemorrhoidectomy on 03/02/21.    Hgb slowly improving. EPO 10000 units ordered with dialysis   3. Secondary Hyperparathyroidism: with outpatient labs: phosphorus 5.1, corrected calcium 8.7 on 02/18/21.   Lab Results  Component Value Date   CALCIUM 7.9 (L) 03/06/2021   PHOS 4.4 03/01/2021    Phosphorus and corrected calcium of 9.2 within acceptable range.  Binders not required at this time  4. Diabetes mellitus type II with chronic kidney disease: insulin dependent. Home regimen includes NPH. Most recent hemoglobin A1c is 5.6 on 01/25/21.  Glucose stable  5.  Hypertension with chronic kidney disease.  Home regimen includes carvedilol and diltiazem.  Currently prescribed carvedilol and diltiazem.  Heart rate between 1 teens to 130s during dialysis.  AM. magnesium 1.9, ordered additional magnesium to ensure adequate level.  If less than 1.5, will supplement with 2 g of magnesium IV.  6. Afib with RVR Amiodarone drip discontinued yesterday.  Current management with p.o. Cardizem.   LOS: [place] 11/30/20221:47 PM

## 2021-03-08 ENCOUNTER — Encounter: Payer: Self-pay | Admitting: Surgery

## 2021-03-11 ENCOUNTER — Encounter: Payer: Self-pay | Admitting: Surgery

## 2021-03-11 ENCOUNTER — Ambulatory Visit
Admission: RE | Admit: 2021-03-11 | Discharge: 2021-03-11 | Disposition: A | Discharge status: Home / Self Care | Payer: Medicare Other | Source: Ambulatory Visit | Attending: Interventional Radiology | Admitting: Interventional Radiology

## 2021-03-11 ENCOUNTER — Other Ambulatory Visit: Payer: Self-pay

## 2021-03-11 ENCOUNTER — Telehealth: Payer: Self-pay

## 2021-03-11 ENCOUNTER — Other Ambulatory Visit: Payer: Self-pay | Admitting: Surgery

## 2021-03-11 ENCOUNTER — Other Ambulatory Visit: Payer: Self-pay | Admitting: Interventional Radiology

## 2021-03-11 ENCOUNTER — Ambulatory Visit (INDEPENDENT_AMBULATORY_CARE_PROVIDER_SITE_OTHER): Payer: Medicare Other | Admitting: Surgery

## 2021-03-11 ENCOUNTER — Other Ambulatory Visit: Payer: Self-pay | Admitting: Student

## 2021-03-11 ENCOUNTER — Ambulatory Visit
Admission: RE | Admit: 2021-03-11 | Discharge: 2021-03-11 | Disposition: A | Discharge status: Home / Self Care | Payer: Medicare Other | Source: Ambulatory Visit | Attending: Surgery | Admitting: Surgery

## 2021-03-11 VITALS — BP 96/56 | HR 75 | Temp 97.9°F | Ht 78.0 in | Wt 206.0 lb

## 2021-03-11 DIAGNOSIS — K8010 Calculus of gallbladder with chronic cholecystitis without obstruction: Secondary | ICD-10-CM | POA: Diagnosis not present

## 2021-03-11 DIAGNOSIS — Y849 Medical procedure, unspecified as the cause of abnormal reaction of the patient, or of later complication, without mention of misadventure at the time of the procedure: Secondary | ICD-10-CM | POA: Diagnosis not present

## 2021-03-11 DIAGNOSIS — T85628A Displacement of other specified internal prosthetic devices, implants and grafts, initial encounter: Secondary | ICD-10-CM | POA: Diagnosis not present

## 2021-03-11 DIAGNOSIS — Y828 Other medical devices associated with adverse incidents: Secondary | ICD-10-CM

## 2021-03-11 DIAGNOSIS — K819 Cholecystitis, unspecified: Secondary | ICD-10-CM

## 2021-03-11 DIAGNOSIS — T85520A Displacement of bile duct prosthesis, initial encounter: Secondary | ICD-10-CM | POA: Diagnosis not present

## 2021-03-11 DIAGNOSIS — K810 Acute cholecystitis: Secondary | ICD-10-CM

## 2021-03-11 DIAGNOSIS — R109 Unspecified abdominal pain: Secondary | ICD-10-CM | POA: Diagnosis present

## 2021-03-11 HISTORY — PX: IR PERC CHOLECYSTOSTOMY: IMG2326

## 2021-03-11 LAB — GLUCOSE, CAPILLARY: Glucose-Capillary: 139 mg/dL — ABNORMAL HIGH (ref 70–99)

## 2021-03-11 IMAGING — XA IR CHOLECYSTOSTOMY
1 series · 12 of 12 positions shown · non-contrast
Comparison: CT AP, [DATE].

INDICATION: Acute cholecystitis

EXAM:
1. ATTEMPTED FLUOROSCOPIC REPLACEMENT OF EXISTING CHOLECYSTOSTOMY
TUBE
2. ULTRASOUND AND FLUOROSCOPIC-GUIDED NEW CHOLECYSTOSTOMY TUBE
PLACEMENT

[Series 2: interv standard · 9 acquisitions, 12 frames shown]
[im 1/9]
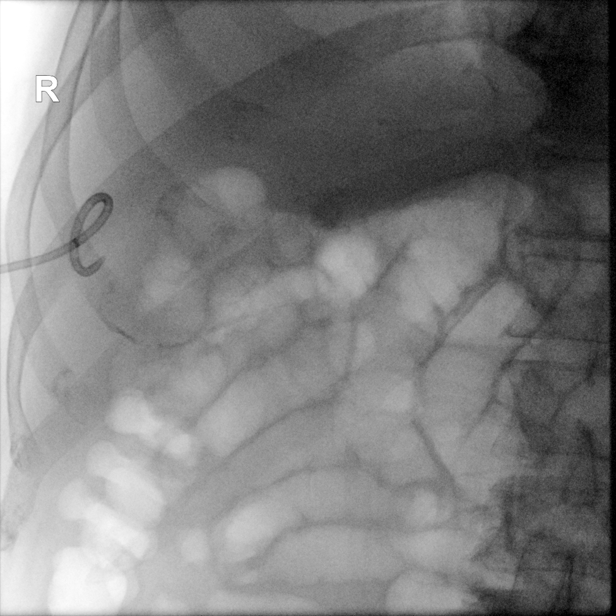
[im 2/9]
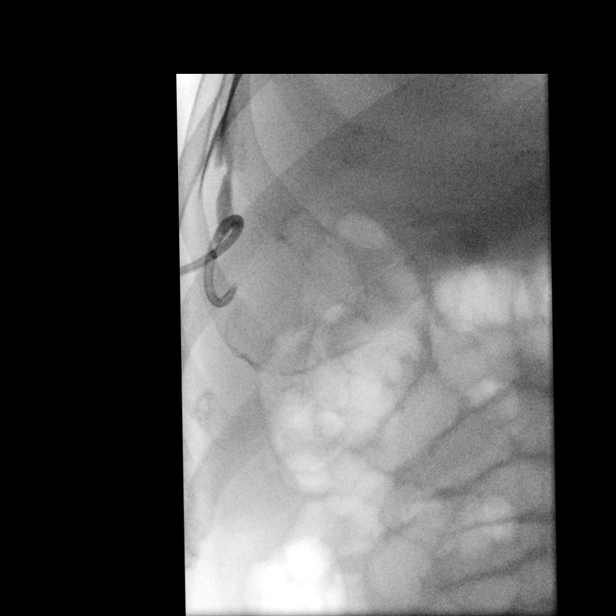
[im 3/9]
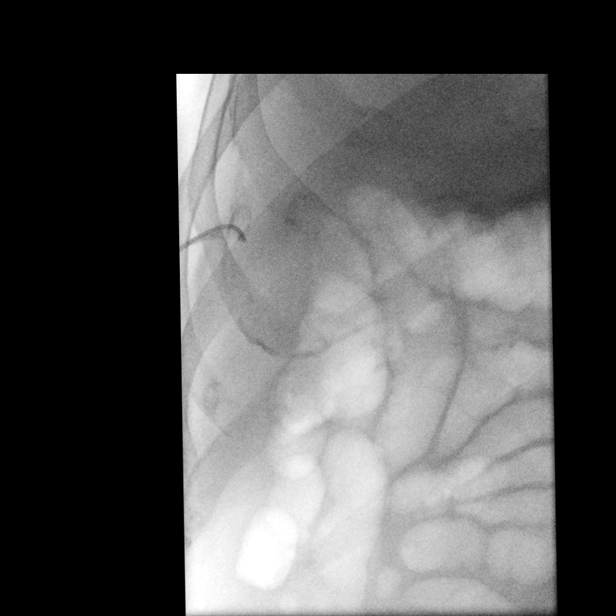
[im 4/9]
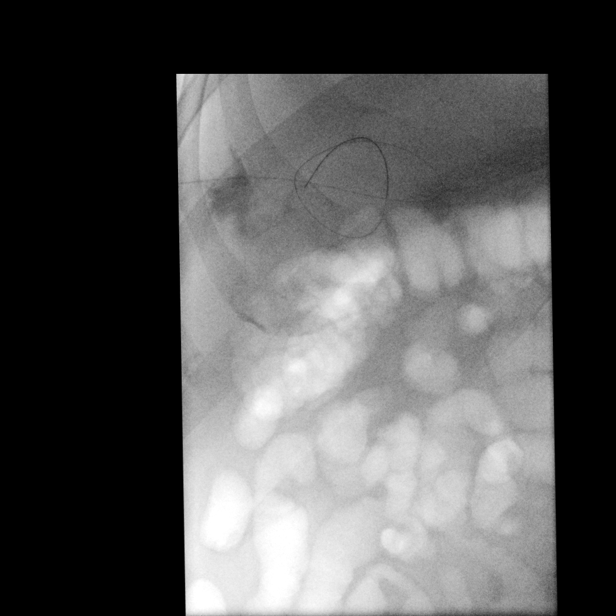
[im 5/9]
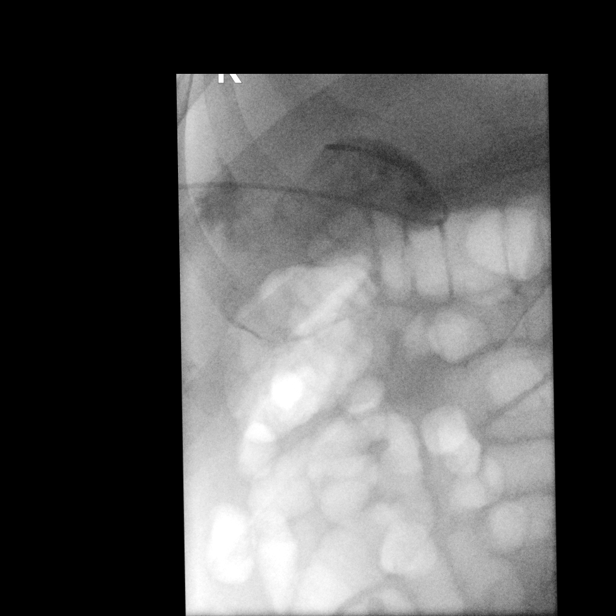
[im 6/9]
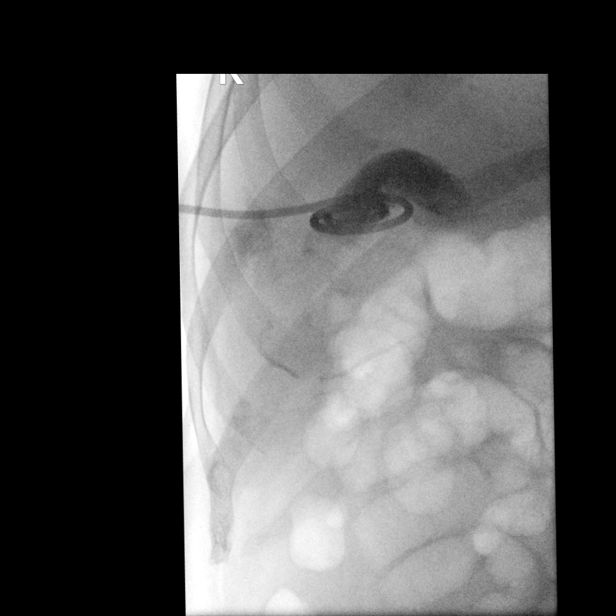
[im 7/9]
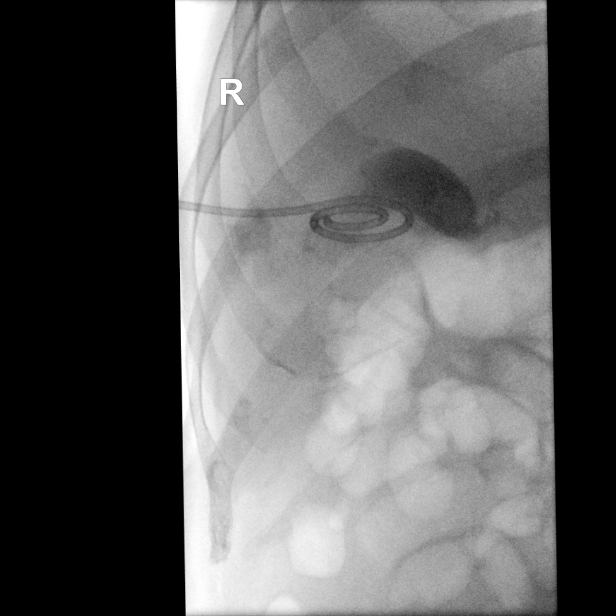
[im 8/9]
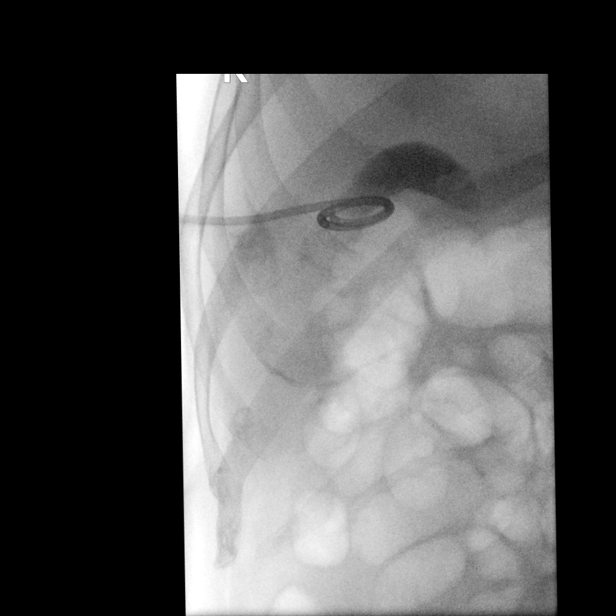
[im 9/9]
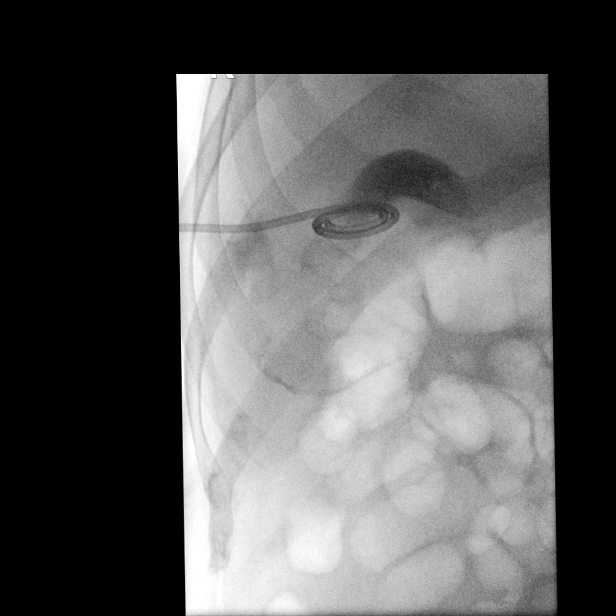
[im 9/9]
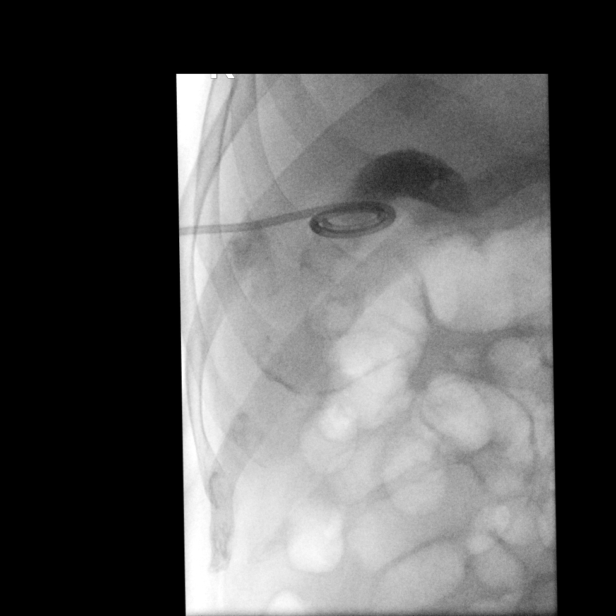
[im 9/9]
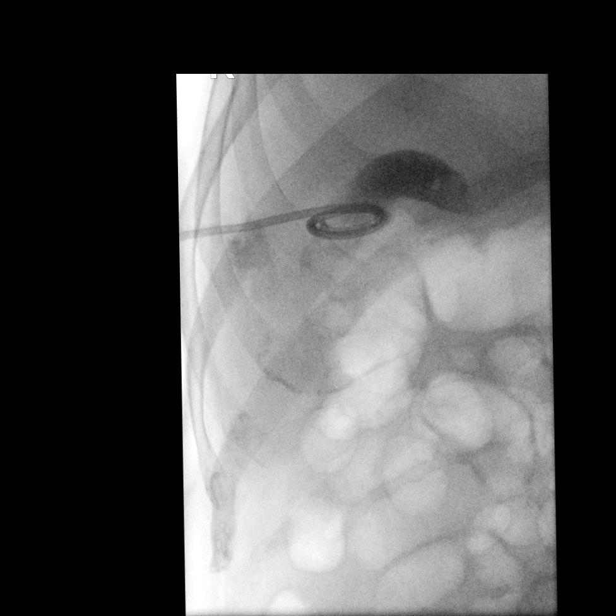
[im 9/9]
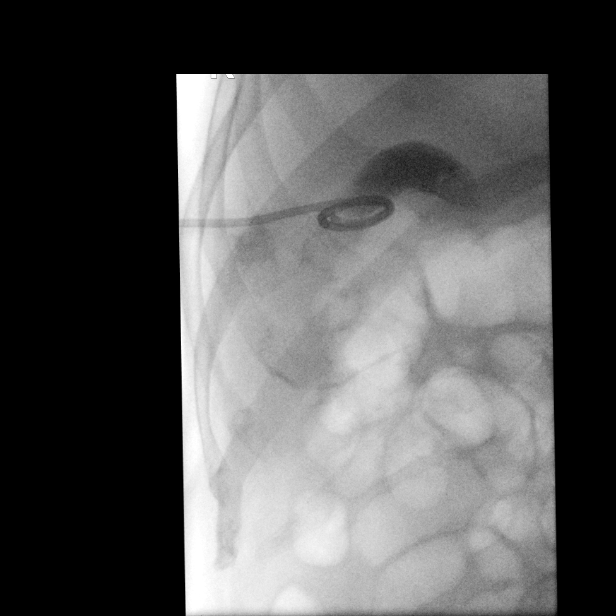

[12 of 12 positions shown; findings below may reference images not displayed]

IR fluoroscopy, [DATE].

MEDICATIONS:
None.

ANESTHESIA/SEDATION:
Local anesthetic was provided for the procedure.

CONTRAST:  30mL OMNIPAQUE IOHEXOL 350 MG/ML SOLN - administered into
the gallbladder fossa.

FLUOROSCOPY TIME:  3 minutes 24 seconds (26.8 mGy)

COMPLICATIONS:
None immediate.

PROCEDURE:
Informed written consent was obtained from the patient after a
discussion of the risks, benefits and alternatives to treatment.
Questions regarding the procedure were encouraged and answered. A
timeout was performed prior to the initiation of the procedure.

The right upper abdominal quadrant was prepped and draped in the
usual sterile fashion, and a sterile drape was applied covering the
operative field. Maximum barrier sterile technique with sterile
gowns and gloves were used for the procedure. A timeout was
performed prior to the initiation of the procedure. Local anesthesia
was provided with 1% lidocaine with epinephrine.

Initial contrast injection through the pre-existing catheter
demonstrating free spillage of contrast into the RIGHT upper
quadrant. The catheter was removed over wire, and using a Kumpe
catheter the prior tract was attempted to be cannulated. Multiple
attempts were unsuccessful. The decision was made for new catheter
insertion.

Ultrasound scanning of the right upper quadrant demonstrates a
markedly dilated gallbladder. Utilizing a transhepatic approach, a
22 gauge needle was advanced into the gallbladder under direct
ultrasound guidance. An ultrasound image was saved for documentation
purposes. Appropriate intraluminal puncture was confirmed with the
efflux of bile and advancement of an 0.018 wire into the gallbladder
lumen. The needle was exchanged for an Accustick set. A small amount
of contrast was injected to confirm appropriate intraluminal
positioning. Over a Benson wire, a 10.2-TARLA cholecystomy
tube was advanced into the gallbladder fossa, coiled and locked.
Bile was aspirated and a small amount of contrast was injected as
several post procedural spot radiographic images were obtained in
various obliquities. The catheter was secured to the skin with
suture, connected to a drainage bag and a dressing was placed. The
patient tolerated the procedure well without immediate post
procedural complication.
IMPRESSION: Attempted fluoroscopic-guided cholecystostomy tube replacement. This
was unsuccessful.

Successful ultrasound and fluoroscopic guided placement of a
French new cholecystostomy tube.

PLAN:
Cholecystostomy drain maintenance with 10 mm via the flushes.

He will be sent home with P.O. antibiotics given his acute
presentation.

The patient is to [REDACTED] for catheter
check in 6 weeks.

He is closely followed by his general surgeon (Dr. TARLA) for
evaluation for interval cholecystectomy.

## 2021-03-11 IMAGING — DX DG CHEST 1V
1 series · 1 of 1 positions shown · non-contrast
Comparison: Chest XR, [DATE].

CLINICAL DATA: Chest pain.

EXAM:
CHEST  1 VIEW

[chest ap]
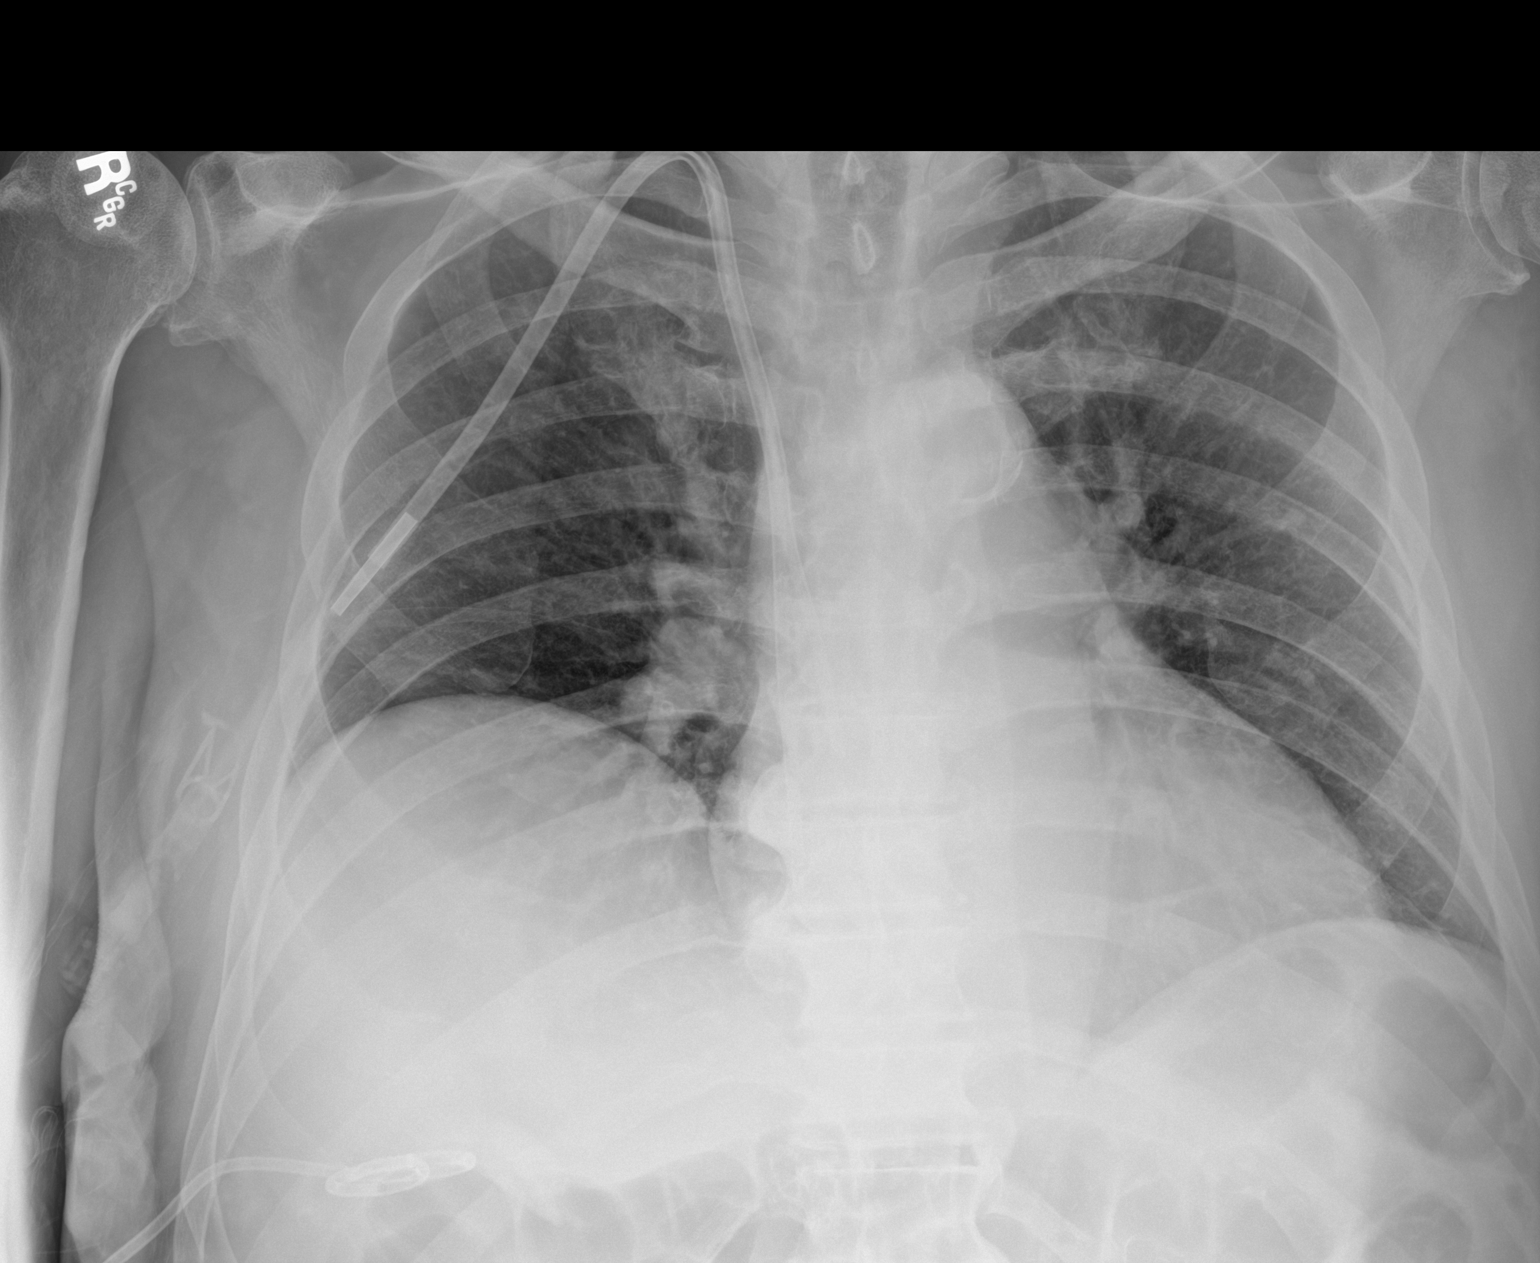

[1 of 1 positions shown; findings below may reference images not displayed]

FINDINGS: Support lines: RIGHT IJ tunneled dialysis catheter. RIGHT upper
quadrant cholecystostomy tube.

Cardiomediastinal silhouette is within normal limits. Aortic arch
calcifications. Hypoinflation. No focal consolidation or mass. No
pleural effusion or pneumothorax. No acute displaced fracture.
IMPRESSION: Hypoinflation without acute superimposed cardiopulmonary process.

## 2021-03-11 IMAGING — US IR CHOLECYSTOSTOMY
1 series · 2 of 2 positions shown · non-contrast
Comparison: CT AP, [DATE].

INDICATION: Acute cholecystitis

EXAM:
1. ATTEMPTED FLUOROSCOPIC REPLACEMENT OF EXISTING CHOLECYSTOSTOMY
TUBE
2. ULTRASOUND AND FLUOROSCOPIC-GUIDED NEW CHOLECYSTOSTOMY TUBE
PLACEMENT

[Series 1: ir cholecystostomy · 0.17mm/px · 2 of 2 slices shown]
[im 1/2]
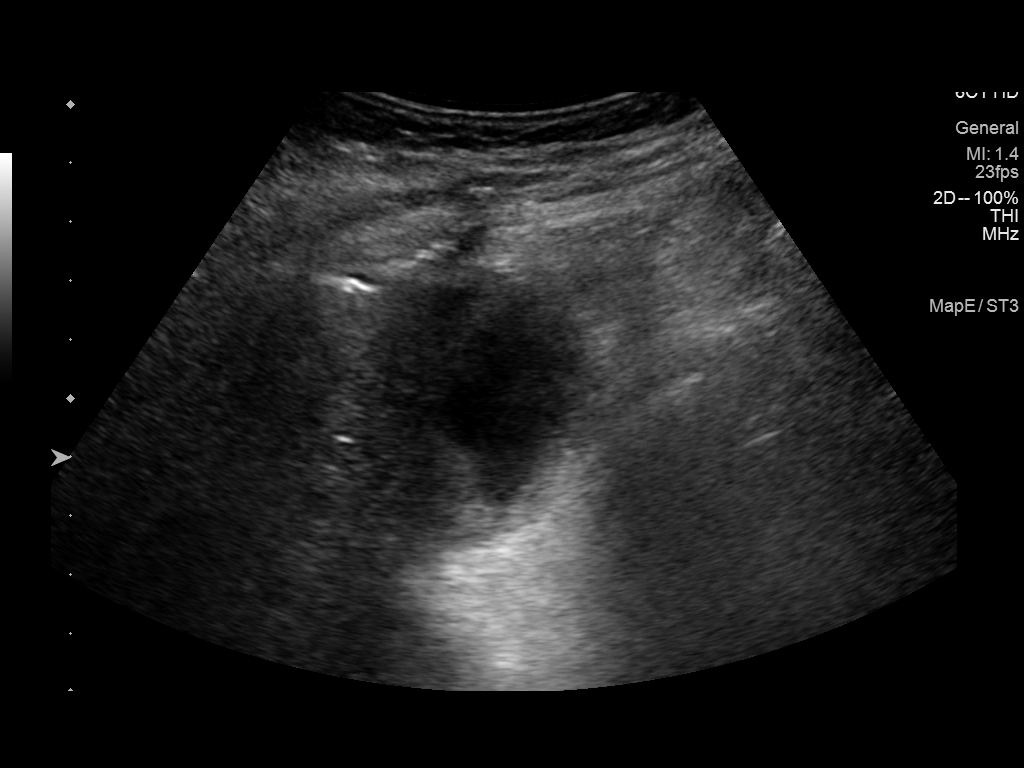
[im 2/2]
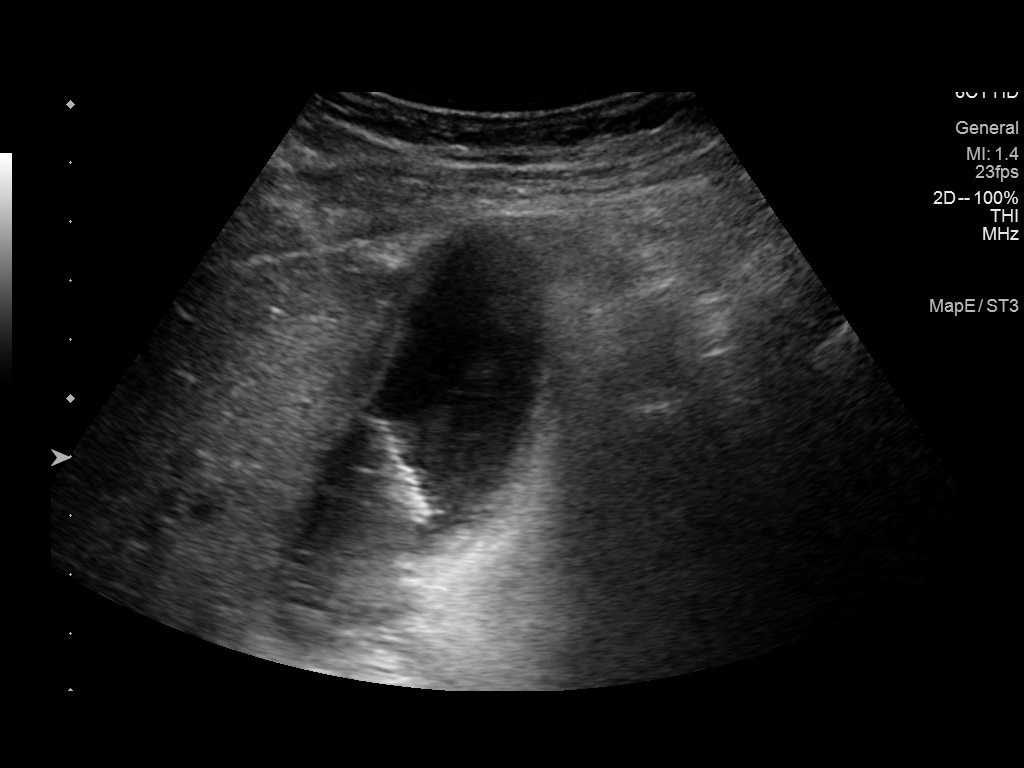

[2 of 2 positions shown; findings below may reference images not displayed]

IR fluoroscopy, [DATE].

MEDICATIONS:
None.

ANESTHESIA/SEDATION:
Local anesthetic was provided for the procedure.

CONTRAST:  30mL OMNIPAQUE IOHEXOL 350 MG/ML SOLN - administered into
the gallbladder fossa.

FLUOROSCOPY TIME:  3 minutes 24 seconds (26.8 mGy)

COMPLICATIONS:
None immediate.

PROCEDURE:
Informed written consent was obtained from the patient after a
discussion of the risks, benefits and alternatives to treatment.
Questions regarding the procedure were encouraged and answered. A
timeout was performed prior to the initiation of the procedure.

The right upper abdominal quadrant was prepped and draped in the
usual sterile fashion, and a sterile drape was applied covering the
operative field. Maximum barrier sterile technique with sterile
gowns and gloves were used for the procedure. A timeout was
performed prior to the initiation of the procedure. Local anesthesia
was provided with 1% lidocaine with epinephrine.

Initial contrast injection through the pre-existing catheter
demonstrating free spillage of contrast into the RIGHT upper
quadrant. The catheter was removed over wire, and using a Kumpe
catheter the prior tract was attempted to be cannulated. Multiple
attempts were unsuccessful. The decision was made for new catheter
insertion.

Ultrasound scanning of the right upper quadrant demonstrates a
markedly dilated gallbladder. Utilizing a transhepatic approach, a
22 gauge needle was advanced into the gallbladder under direct
ultrasound guidance. An ultrasound image was saved for documentation
purposes. Appropriate intraluminal puncture was confirmed with the
efflux of bile and advancement of an 0.018 wire into the gallbladder
lumen. The needle was exchanged for an Accustick set. A small amount
of contrast was injected to confirm appropriate intraluminal
positioning. Over a Benson wire, a 10.2-TARLA cholecystomy
tube was advanced into the gallbladder fossa, coiled and locked.
Bile was aspirated and a small amount of contrast was injected as
several post procedural spot radiographic images were obtained in
various obliquities. The catheter was secured to the skin with
suture, connected to a drainage bag and a dressing was placed. The
patient tolerated the procedure well without immediate post
procedural complication.
IMPRESSION: Attempted fluoroscopic-guided cholecystostomy tube replacement. This
was unsuccessful.

Successful ultrasound and fluoroscopic guided placement of a
French new cholecystostomy tube.

PLAN:
Cholecystostomy drain maintenance with 10 mm via the flushes.

He will be sent home with P.O. antibiotics given his acute
presentation.

The patient is to [REDACTED] for catheter
check in 6 weeks.

He is closely followed by his general surgeon (Dr. TARLA) for
evaluation for interval cholecystectomy.

## 2021-03-11 MED ORDER — SODIUM CHLORIDE 0.9% FLUSH
10.0000 mL | Freq: Two times a day (BID) | INTRAVENOUS | Status: DC
  Filled 2021-03-11: qty 10

## 2021-03-11 MED ORDER — AMOXICILLIN-POT CLAVULANATE 875-125 MG PO TABS: 1.0000 | ORAL_TABLET | Freq: Two times a day (BID) | ORAL | 0 refills | Status: AC

## 2021-03-11 MED ORDER — ACETAMINOPHEN 325 MG PO TABS
650.0000 mg | ORAL_TABLET | Freq: Four times a day (QID) | ORAL | Status: DC | PRN
  Administered 2021-03-11: 650 mg via ORAL
  Filled 2021-03-11 (×2): qty 2

## 2021-03-11 MED ORDER — ACETAMINOPHEN 325 MG PO TABS
ORAL_TABLET | ORAL | Status: AC
  Filled 2021-03-11: qty 2

## 2021-03-11 MED ORDER — IOHEXOL 350 MG/ML SOLN
30.0000 mL | Freq: Once | INTRAVENOUS | Status: AC | PRN
  Administered 2021-03-11: 30 mL
  Filled 2021-03-11: qty 30

## 2021-03-11 MED ORDER — LIDOCAINE HCL 1 % IJ SOLN
INTRAMUSCULAR | Status: AC
  Administered 2021-03-11: 20 mL
  Filled 2021-03-11: qty 20

## 2021-03-11 NOTE — Patient Instructions (Addendum)
Stat Cholangiogram . Please go directly to [place] entrance of [facility] and check in. Dr.Pabon will call patient with results.

## 2021-03-11 NOTE — Progress Notes (Signed)
Outpatient Surgical Follow Up  03/11/2021  Gregory Dixon is an 80 y.o. male.   Chief Complaint  Patient presents with   Routine Post Op    Hemorrhoidectomy     HPI: Gregory Dixon is a 80 year old male that I did hemorrhoidectomy for persistent lower GI bleed 9 days ago.  He states that his pain is not that bad in the anorectal area.  He comes in specifically for right upper quadrant pain.  He did have a prior history of cholecystostomy tube placement after he developed cholecystitis about 6 weeks ago.  He did have a catheter exchanged schedule in 3 days.  He reports that couple of days ago the catheter got wrapped between the wheelchair and has been partially dislodged.  He started hurting yesterday within the right upper quadrant pain is intermittent moderate intensity worsening when he takes a deep breath.  No fevers no chills.  He had hemodialysis earlier this morning. Last bloody bowel movement was 3 days ago. Please note that this visit was focused on cholecystitis. He did have a CT scan of the abdomen pelvis a few weeks ago that I personally reviewed showing rapid placement of cholecystostomy tube with cholelithiasis. Please note that this visit was not a routine postoperative revisit but rather management of a recurrent acute issue  Past Medical History:  Diagnosis Date   Actinic keratosis    Basal cell carcinoma 10/21/2017   left lateral neck, excised 02/09/2018   Basal cell carcinoma (BCC) 05/12/2019   right posterior ear, excised 05/31/2019   Blood transfusion without reported diagnosis    Diabetes mellitus without complication ([place])    Dysrhythmia     Past Surgical History:  Procedure Laterality Date   COLONOSCOPY N/A 02/24/2021   Procedure: COLONOSCOPY;  Surgeon: [name], MD;  Location: [facility] ENDOSCOPY;  Service: Endoscopy;  Laterality: N/A;   COLONOSCOPY WITH PROPOFOL N/A 04/29/2019   Procedure: COLONOSCOPY WITH PROPOFOL;  Surgeon: Jonathon Bellows, MD;  Location: [facility]  ENDOSCOPY;  Service: Gastroenterology;  Laterality: N/A;   DIALYSIS/PERMA CATHETER INSERTION Bilateral 01/28/2021   Procedure: DIALYSIS/PERMA CATHETER INSERTION;  Surgeon: Algernon Huxley, MD;  Location: [place] CV LAB;  Service: Cardiovascular;  Laterality: Bilateral;   ESOPHAGOGASTRODUODENOSCOPY N/A 02/24/2021   Procedure: ESOPHAGOGASTRODUODENOSCOPY (EGD);  Surgeon: [name], MD;  Location: [facility] ENDOSCOPY;  Service: Endoscopy;  Laterality: N/A;   ESOPHAGOGASTRODUODENOSCOPY (EGD) WITH PROPOFOL N/A 04/29/2019   Procedure: ESOPHAGOGASTRODUODENOSCOPY (EGD) WITH PROPOFOL;  Surgeon: Jonathon Bellows, MD;  Location: [facility] ENDOSCOPY;  Service: Gastroenterology;  Laterality: N/A;   HEMORRHOID SURGERY N/A 03/02/2021   Procedure: HEMORRHOIDECTOMY;  Surgeon: Jules Husbands, MD;  Location: [facility] ORS;  Service: General;  Laterality: N/A;   IR PERC CHOLECYSTOSTOMY  01/25/2021    Family History  Problem Relation Age of Onset   Hypertension Father     Social History:  reports that he has quit smoking. He has never used smokeless tobacco. He reports that he does not currently use alcohol. He reports that he does not use drugs.  Allergies:  Allergies  Allergen Reactions   Aspirin Other (See Comments)    GI bleed   Codeine Nausea Only and Nausea And Vomiting    Medications reviewed.    ROS Full ROS performed and is otherwise negative other than what is stated in HPI   BP (!) 96/56   Pulse 75   Temp 97.9 F (36.6 C) (Oral)   Ht 6\' 6"  (1.981 m)   Wt 206 lb (93.4 kg)   SpO2  92%   BMI 23.81 kg/m   Physical Exam Vitals and nursing note reviewed. Exam conducted with a chaperone present.  Constitutional:      General: He is not in acute distress.    Appearance: He is normal weight.     Comments: Chronically ill  Eyes:     General: No scleral icterus.       Right eye: No discharge.        Left eye: No discharge.  Cardiovascular:     Rate and Rhythm: Normal rate and regular rhythm.   Pulmonary:     Effort: Pulmonary effort is normal. No respiratory distress.     Breath sounds: Normal breath sounds. No stridor.     Comments: Permacath in place on the right side. Abdominal:     General: Abdomen is flat. There is no distension.     Palpations: Abdomen is soft. There is no mass.     Tenderness: There is abdominal tenderness. There is no right CVA tenderness, left CVA tenderness, guarding or rebound.     Hernia: No hernia is present.     Comments: Cholecystostomy tube partially dislodged.  There is some bile within the back  Musculoskeletal:     Cervical back: Normal range of motion and neck supple. No rigidity or tenderness.  Skin:    General: Skin is warm and dry.     Capillary Refill: Capillary refill takes less than 2 seconds.     Coloration: Skin is not jaundiced or pale.     Findings: No bruising or erythema.  Neurological:     General: No focal deficit present.     Mental Status: He is alert and oriented to person, place, and time.  Psychiatric:        Mood and Affect: Mood normal.        Behavior: Behavior normal.        Thought Content: Thought content normal.        Judgment: Judgment normal.      Assessment/Plan: 80 year old male with history of cholecystitis now with catheter dislodgment in need for revision and exchange of cholecystostomy tube.  We will order an urgent catheter replacement hopefully today.,  Hemorrhoidal perspective he has improved and currently there is no active bleeding that will mandate rate intervention. We will follow him up after he completes his cholecystostomy tube placement. 's note I spent greater than 40 minutes in this encounter including coordinating his care, counseling the patient and performing appropriate documentation.   Caroleen Hamman, MD [facility] General Surgeon

## 2021-03-11 NOTE — Telephone Encounter (Signed)
Placed order for Augmentin 875 for 10 days. Left message for patient to return call to office to let him know to pick medication up at the pharmacy.

## 2021-03-11 NOTE — Procedures (Signed)
Vascular and Interventional Radiology Procedure Note  Patient: Gregory Dixon DOB: 1940/09/10 Medical Record Number: [id number] Note Date/Time: 03/11/21 3:09 PM   Performing Physician: Michaelle Birks, MD Assistant(s): None  Diagnosis: Dislodged chole tube. RUQ pain.  Procedure:  ATTEMPTED DRAIN REPLACEMENT CHOLECYSTOSTOMY TUBE PLACEMENT (New)   Anesthesia: Local Anesthetic Complications: None Estimated Blood Loss:  0 mL Specimens:  None  Findings:  Successful placement of 58F cholecystostomy tube.  Plan: Flush tube w 10 mL sterile NS and record drain output qShift. Follow up for routine tube evaluation in 6 week(s).  Pt follows with Dr. Caroleen Hamman ([facility]).  PO Abx written per referring MD.  See detailed procedure note with images in PACS. The patient tolerated the procedure well without incident or complication and was returned to Recovery in stable condition.    Michaelle Birks, MD [facility]   Pager. [place]

## 2021-03-13 ENCOUNTER — Telehealth: Payer: Self-pay | Admitting: *Deleted

## 2021-03-13 NOTE — Telephone Encounter (Signed)
Patient called back and has already picked up his medication at the pharmacy

## 2021-03-14 ENCOUNTER — Ambulatory Visit: Admission: RE | Admit: 2021-03-14 | Payer: Medicare Other | Source: Ambulatory Visit | Admitting: Radiology

## 2021-03-18 ENCOUNTER — Encounter: Payer: Medicare Other | Admitting: Surgery

## 2021-03-18 ENCOUNTER — Telehealth: Payer: Self-pay | Admitting: Surgery

## 2021-03-18 NOTE — Telephone Encounter (Signed)
Patient is calling and has some questions about her husbands drain and the tube. Please call patient and advise.

## 2021-03-18 NOTE — Telephone Encounter (Signed)
Spoke with the patient's wife and she was wanting more drain supplies in case the patient needed a replacement tube or bag. I let her know she would need to call interventional radiology as we do not carry any of those supplies.

## 2021-03-26 ENCOUNTER — Other Ambulatory Visit: Payer: Self-pay

## 2021-03-26 ENCOUNTER — Encounter: Payer: Self-pay | Admitting: Physician Assistant

## 2021-03-26 ENCOUNTER — Ambulatory Visit (INDEPENDENT_AMBULATORY_CARE_PROVIDER_SITE_OTHER): Payer: Medicare Other | Admitting: Physician Assistant

## 2021-03-26 VITALS — BP 102/61 | HR 61 | Temp 97.9°F | Ht 78.0 in | Wt 206.4 lb

## 2021-03-26 DIAGNOSIS — K8010 Calculus of gallbladder with chronic cholecystitis without obstruction: Secondary | ICD-10-CM

## 2021-03-26 DIAGNOSIS — Z09 Encounter for follow-up examination after completed treatment for conditions other than malignant neoplasm: Secondary | ICD-10-CM

## 2021-03-26 NOTE — Progress Notes (Signed)
[facility] POST-OP OFFICE VISIT  03/26/2021  HPI: Gregory Dixon is a 80 y.o. male 24 days s/p hemorrhoidectomy with Dr Dahlia Byes. This was his focused post-operative visit. He has been doing well at home with this. Minor sporadic blood when wiping but no gross blood with bowel movements. No significant pain.   Additionally, he does have a history of cholecystitis on 10/19 and underwent percutaneous cholecystostomy tube placement due to severe deconditioning. Unfortunately, he presented on 12/05 for follow up after his tube became partially  dislodged at home. This was replaced by IR on 12/05. He has some discomfort at the site with deep inspiration. This continues to drain bilious output. He is frustrated and ready to have surgery.   Vital signs: BP 102/61    Pulse 61    Temp 97.9 F (36.6 C) (Oral)    Ht 6\' 6"  (1.981 m)    Wt 206 lb 6.4 oz (93.6 kg)    SpO2 93%    BMI 23.85 kg/m    Physical Exam: Constitutional: Well appearing male, NAD Abdomen: Soft, sore around RUQ percutaneous drain, non-distneded, no rebound. Output from cholecystostomy tube is bilious in nature GU: Chaperone present, hemorrhoidectomy well healed, no signs of bleeding  Assessment/Plan: This is a 80 y.o. male 24 days s/p hemorrhoidectomy, also with cholecystitis s/p percutaneous drain placement   - From a hemorrhoid perspective he has healed well; nothing further to add  - Continue percutaneous cholecystostomy tube; monitor output; I will have him return to clinic in ~2 weeks to discuss potential surgery with Dr Christian Mate.   -- Edison Simon, PA-C [facility] 03/26/2021, 2:38 PM [phone] M-F: 7am - 4pm

## 2021-03-26 NOTE — Patient Instructions (Addendum)
If you have any concerns or questions, please feel free to call our office. See follow up appointment below.   Surgical Procedures for Hemorrhoids, Care After This sheet gives you information about how to care for yourself after your procedure. Your health care provider may also give you more specific instructions. If you have problems or questions, contact your health care provider. What can I expect after the procedure? After the procedure, it is common to have: Rectal pain. Pain when you are having a bowel movement. Slight rectal bleeding. This is more likely to happen with the first bowel movement after surgery. Follow these instructions at home: Medicines Take over-the-counter and prescription medicines only as told by your health care provider. If you were prescribed an antibiotic medicine, use it as told by your health care provider. Do not stop using the antibiotic even if your condition improves. Ask your health care provider if the medicine prescribed to you requires you to avoid driving or using heavy machinery. Use a stool softener or a bulk laxative as told by your health care provider. Eating and drinking Follow instructions from your health care provider about what to eat or drink after your procedure. You may need to take actions to prevent or treat constipation, such as: Drink enough fluid to keep your urine pale yellow. Take over-the-counter or prescription medicines. Eat foods that are high in fiber, such as beans, whole grains, and fresh fruits and vegetables. Limit foods that are high in fat and processed sugars, such as fried or sweet foods. Activity  Rest as told by your health care provider. Avoid sitting for a long time without moving. Get up to take short walks every 1-2 hours. This is important to improve blood flow and breathing. Ask for help if you feel weak or unsteady. Return to your normal activities as told by your health care provider. Ask your health care  provider what activities are safe for you. Do not lift anything that is heavier than 10 lb (4.5 kg), or the limit that you are told, until your health care provider says that it is safe. Do not strain to have a bowel movement. Do not spend a long time sitting on the toilet. General instructions  Take warm sitz baths for 15-20 minutes, 2-3 times a day to relieve soreness or itching and to keep the rectal area clean. Apply ice packs to the area to reduce swelling and pain. Do not drive for 24 hours if you were given a sedative during your procedure. Keep all follow-up visits as told by your health care provider. This is important. Contact a health care provider if: Your pain medicine is not helping. You have a fever or chills. You have bad smelling drainage. You have a lot of swelling. You become constipated. You have trouble passing urine. Get help right away if: You have very bad rectal pain. You have heavy bleeding from your rectum. Summary After the procedure, it is common to have pain and slight rectal bleeding. Take warm sitz baths for 15-20 minutes, 2-3 times a day to relieve soreness or itching and to keep the rectal area clean. Avoid straining when having a bowel movement. Eat foods that are high in fiber, such as beans, whole grains, and fresh fruits and vegetables. Take over-the-counter and prescription medicines only as told by your health care provider. This information is not intended to replace advice given to you by your health care provider. Make sure you discuss any questions you have with  your health care provider. Document Revised: 10/03/2020 Document Reviewed: 10/03/2020 Elsevier Patient Education  [place].

## 2021-03-27 ENCOUNTER — Other Ambulatory Visit: Payer: Self-pay

## 2021-03-27 MED ORDER — SODIUM CHLORIDE 0.9% FLUSH
5.0000 mL | Freq: Two times a day (BID) | INTRAVENOUS | 0 refills | Status: DC
Start: 2021-03-27 — End: 2021-04-07

## 2021-04-03 ENCOUNTER — Other Ambulatory Visit: Payer: Self-pay | Admitting: Surgery

## 2021-04-09 ENCOUNTER — Ambulatory Visit (INDEPENDENT_AMBULATORY_CARE_PROVIDER_SITE_OTHER): Payer: Medicare Other | Admitting: Surgery

## 2021-04-09 ENCOUNTER — Other Ambulatory Visit: Payer: Self-pay

## 2021-04-09 ENCOUNTER — Encounter: Payer: Self-pay | Admitting: Surgery

## 2021-04-09 VITALS — BP 99/47 | HR 75 | Ht 78.0 in | Wt 209.0 lb

## 2021-04-09 DIAGNOSIS — K8010 Calculus of gallbladder with chronic cholecystitis without obstruction: Secondary | ICD-10-CM | POA: Diagnosis not present

## 2021-04-09 NOTE — H&P (View-Only) (Signed)
Patient ID: Gregory Dixon, male   DOB: 1940/05/25, 81 y.o.   MRN: [id number]  Chief Complaint: Cholecystostomy  History of Present Illness Gregory Dixon is a 81 y.o. male with a cholecystostomy present, just over 4 weeks.  Following replacement, leading to his intercostal, and causing the patient a significant amount of discomfort.  Is preventing him from staying asleep during the night with any changes in positioning, and causing some pain with inspiration.  He responded quite readily to medical management and currently has clear bile draining from his bile bag.  He recently underwent general anesthetic for his hemorrhoidectomy.  He wants very much to proceed with cholecystectomy, excepting any risks involved with proceeding with surgery in this interval.   Past Medical History Past Medical History:  Diagnosis Date   Actinic keratosis    Basal cell carcinoma 10/21/2017   left lateral neck, excised 02/09/2018   Basal cell carcinoma (BCC) 05/12/2019   right posterior ear, excised 05/31/2019   Blood transfusion without reported diagnosis    Diabetes mellitus without complication ([place])    Dysrhythmia       Past Surgical History:  Procedure Laterality Date   COLONOSCOPY N/A 02/24/2021   Procedure: COLONOSCOPY;  Surgeon: [name], MD;  Location: [facility] ENDOSCOPY;  Service: Endoscopy;  Laterality: N/A;   COLONOSCOPY WITH PROPOFOL N/A 04/29/2019   Procedure: COLONOSCOPY WITH PROPOFOL;  Surgeon: Jonathon Bellows, MD;  Location: [facility] ENDOSCOPY;  Service: Gastroenterology;  Laterality: N/A;   DIALYSIS/PERMA CATHETER INSERTION Bilateral 01/28/2021   Procedure: DIALYSIS/PERMA CATHETER INSERTION;  Surgeon: Algernon Huxley, MD;  Location: [place] CV LAB;  Service: Cardiovascular;  Laterality: Bilateral;   ESOPHAGOGASTRODUODENOSCOPY N/A 02/24/2021   Procedure: ESOPHAGOGASTRODUODENOSCOPY (EGD);  Surgeon: [name], MD;  Location: [facility] ENDOSCOPY;  Service: Endoscopy;  Laterality: N/A;    ESOPHAGOGASTRODUODENOSCOPY (EGD) WITH PROPOFOL N/A 04/29/2019   Procedure: ESOPHAGOGASTRODUODENOSCOPY (EGD) WITH PROPOFOL;  Surgeon: Jonathon Bellows, MD;  Location: [facility] ENDOSCOPY;  Service: Gastroenterology;  Laterality: N/A;   HEMORRHOID SURGERY N/A 03/02/2021   Procedure: HEMORRHOIDECTOMY;  Surgeon: Jules Husbands, MD;  Location: [facility] ORS;  Service: General;  Laterality: N/A;   IR PERC CHOLECYSTOSTOMY  01/25/2021   IR PERC CHOLECYSTOSTOMY  03/11/2021    Allergies  Allergen Reactions   Aspirin Other (See Comments)    GI bleed   Codeine Nausea Only and Nausea And Vomiting    Current Outpatient Medications  Medication Sig Dispense Refill   apixaban (ELIQUIS) 2.5 MG TABS tablet Take 1 tablet (2.5 mg total) by mouth 2 (two) times daily. 60 tablet 3   BD POSIFLUSH 0.9 % SOLN injection FLUSH WITH 5ML TWICE DAILY 300 mL 0   carvedilol (COREG) 6.25 MG tablet Take 1 tablet (6.25 mg total) by mouth 2 (two) times daily with a meal. 60 tablet 2   diltiazem (CARDIZEM CD) 360 MG 24 hr capsule Take 1 capsule (360 mg total) by mouth daily. 30 capsule 2   epoetin alfa (EPOGEN) 10000 UNIT/ML injection Inject 1 mL (10,000 Units total) into the vein every Monday, Wednesday, and Friday with hemodialysis. 1 mL    fenofibrate 160 MG tablet Take 160 mg by mouth at bedtime.     ferrous sulfate 325 (65 FE) MG tablet Take 325 mg by mouth 2 (two) times daily with a meal.     insulin NPH-regular Human (70-30) 100 UNIT/ML injection Hold until followup with PCP, as I don't think you need insulin anymore.     Multiple Vitamin (MULTIVITAMIN) capsule Take 1 capsule by  mouth daily.     Vitamin D, Ergocalciferol, (DRISDOL) 1.25 MG (50000 UNIT) CAPS capsule Take 1 capsule (50,000 Units total) by mouth every 7 (seven) days. 12 capsule 0   No current facility-administered medications for this visit.    Family History Family History  Problem Relation Age of Onset   Hypertension Father       Social History Social History    Tobacco Use   Smoking status: Former   Smokeless tobacco: Never  Scientific laboratory technician Use: Never used  Substance Use Topics   Alcohol use: Not Currently    Comment: rare once a year   Drug use: Never        Review of Systems  Constitutional: Negative.   HENT: Negative.    Eyes: Negative.   Respiratory: Negative.    Cardiovascular: Negative.   Gastrointestinal: Negative.   Genitourinary: Negative.   Skin: Negative.   Neurological: Negative.   Psychiatric/Behavioral: Negative.       Physical Exam There were no vitals taken for this visit.   CONSTITUTIONAL: Well developed, and nourished, frail-appearing gentleman, however appropriately responsive and aware without distress.   EYES: Sclera non-icteric.   EARS, NOSE, MOUTH AND THROAT: Mask worn.   Hearing is intact to voice.  NECK: Trachea is midline, and there is no jugular venous distension.  LYMPH NODES:  Lymph nodes in the neck are not enlarged. RESPIRATORY:  Lungs are clear, and breath sounds are equal bilaterally. Normal respiratory effort without pathologic use of accessory muscles. CARDIOVASCULAR: Heart is regular in rate and rhythm. GI: There is an intercostal right sided drain which is well secured and draining clear bilious fluid.  The abdomen is soft, nontender, and nondistended. There were no palpable masses.  MUSCULOSKELETAL:  Symmetrical muscle tone appreciated in all four extremities.    SKIN: Skin turgor is normal. No pathologic skin lesions appreciated.  NEUROLOGIC:  Motor and sensation appear grossly normal.  Cranial nerves are grossly without defect. PSYCH:  Alert and oriented to person, place and time. Affect is appropriate for situation.  Data Reviewed I have personally reviewed what is currently available of the patient's imaging, recent labs and medical records.   Labs:  CBC Latest Ref Rng & Units 03/06/2021 03/05/2021 03/04/2021  WBC 4.0 - 10.5 K/uL 6.1 7.1 6.4  Hemoglobin 13.0 - 17.0 g/dL 8.2(L)  7.9(L) 7.0(L)  Hematocrit 39.0 - 52.0 % 24.7(L) 24.0(L) 21.7(L)  Platelets 150 - 400 K/uL 160 146(L) 145(L)   CMP Latest Ref Rng & Units 03/06/2021 03/05/2021 03/04/2021  Glucose 70 - 99 mg/dL 111(H) 105(H) 118(H)  BUN 8 - 23 mg/dL 33(H) 21 36(H)  Creatinine 0.61 - 1.24 mg/dL 6.20(H) 4.47(H) 6.71(H)  Sodium 135 - 145 mmol/L 132(L) 133(L) 140  Potassium 3.5 - 5.1 mmol/L 3.2(L) 3.3(L) 4.0  Chloride 98 - 111 mmol/L 99 100 108  CO2 22 - 32 mmol/L 25 26 26   Calcium 8.9 - 10.3 mg/dL 7.9(L) 7.6(L) 7.8(L)  Total Protein 6.5 - 8.1 g/dL - - -  Total Bilirubin 0.3 - 1.2 mg/dL - - -  Alkaline Phos 38 - 126 U/L - - -  AST 15 - 41 U/L - - -  ALT 0 - 44 U/L - - -      Imaging:  Within last 24 hrs: No results found.  Assessment    Cholecystostomy status, status post acute cholecystitis. 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[place]) 02/28/2021   Protein-calorie malnutrition, severe 02/23/2021   Symptomatic anemia 02/21/2021   Cholecystitis 01/24/2021   Weakness 01/23/2021   Elevated LFTs 01/23/2021   Transaminitis 01/23/2021   MGUS (monoclonal gammopathy of unknown significance)    Thrombocytopenia (HCC)    Acute anemia 11/23/2020   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   Chronic kidney disease 05/11/2019   Hyperlipidemia 05/11/2019   Hypertension, 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   Anemia due to vitamin B12 deficiency 05/05/2019   Benign prostatic hyperplasia 05/05/2019   Acute lower GI bleeding 04/27/2019   Acute blood loss anemia 04/27/2019   Acute kidney injury superimposed on CKD ([place]) 04/27/2019   Iron deficiency anemia due to chronic blood loss 04/27/2019    Type 2 diabetes mellitus with stage 4 chronic kidney disease, without long-term current use of insulin ([place]) 10/16/2015   Essential hypertension 10/16/2015   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 This patient was seen and examined, and I concur with the H&P associated with this note.  This was discussed thoroughly.  Optimal plan is for robotic cholecystectomy.  Risks and benefits have been discussed with the patient which include but are not limited to anesthesia, bleeding, infection, biliary ductal injury or stenosis, other associated unanticipated injuries affiliated with laparoscopic surgery.   We indicated there may be a possibility he may have a drain postop however it would be located in a different location likely temporary in nature.   I believe there is the desire to proceed, interpreter utilized as needed.  Questions elicited and answered to satisfaction.  No guarantees ever expressed or implied.   Note: Biggest challenges determining whether he should be admitted preop for hemodialysis, or held postop as operating day will fall on his hemodialysis today. He has elected to schedule surgery for the 18th, and will hold Eliquis beginning the 16th.  And anticipate hemodialysis postop.  Face-to-face time spent with the patient and accompanying care providers(if present) was 35 minutes, with more than 50% of the time spent counseling, educating, and coordinating care of the patient.    These notes generated with voice recognition software. I apologize for typographical errors.  Ronny Bacon M.D.,  FACS 04/09/2021, 1:21 PM

## 2021-04-09 NOTE — Progress Notes (Signed)
Patient ID: Gregory Dixon, male   DOB: Aug 26, 1940, 81 y.o.   MRN: [id number]  Chief Complaint: Cholecystostomy  History of Present Illness Gregory Dixon is a 81 y.o. male with a cholecystostomy present, just over 4 weeks.  Following replacement, leading to his intercostal, and causing the patient a significant amount of discomfort.  Is preventing him from staying asleep during the night with any changes in positioning, and causing some pain with inspiration.  He responded quite readily to medical management and currently has clear bile draining from his bile bag.  He recently underwent general anesthetic for his hemorrhoidectomy.  He wants very much to proceed with cholecystectomy, excepting any risks involved with proceeding with surgery in this interval.   Past Medical History Past Medical History:  Diagnosis Date   Actinic keratosis    Basal cell carcinoma 10/21/2017   left lateral neck, excised 02/09/2018   Basal cell carcinoma (BCC) 05/12/2019   right posterior ear, excised 05/31/2019   Blood transfusion without reported diagnosis    Diabetes mellitus without complication ([place])    Dysrhythmia       Past Surgical History:  Procedure Laterality Date   COLONOSCOPY N/A 02/24/2021   Procedure: COLONOSCOPY;  Surgeon: [name], MD;  Location: [facility] ENDOSCOPY;  Service: Endoscopy;  Laterality: N/A;   COLONOSCOPY WITH PROPOFOL N/A 04/29/2019   Procedure: COLONOSCOPY WITH PROPOFOL;  Surgeon: Jonathon Bellows, MD;  Location: [facility] ENDOSCOPY;  Service: Gastroenterology;  Laterality: N/A;   DIALYSIS/PERMA CATHETER INSERTION Bilateral 01/28/2021   Procedure: DIALYSIS/PERMA CATHETER INSERTION;  Surgeon: Algernon Huxley, MD;  Location: [place] CV LAB;  Service: Cardiovascular;  Laterality: Bilateral;   ESOPHAGOGASTRODUODENOSCOPY N/A 02/24/2021   Procedure: ESOPHAGOGASTRODUODENOSCOPY (EGD);  Surgeon: [name], MD;  Location: [facility] ENDOSCOPY;  Service: Endoscopy;  Laterality: N/A;    ESOPHAGOGASTRODUODENOSCOPY (EGD) WITH PROPOFOL N/A 04/29/2019   Procedure: ESOPHAGOGASTRODUODENOSCOPY (EGD) WITH PROPOFOL;  Surgeon: Jonathon Bellows, MD;  Location: [facility] ENDOSCOPY;  Service: Gastroenterology;  Laterality: N/A;   HEMORRHOID SURGERY N/A 03/02/2021   Procedure: HEMORRHOIDECTOMY;  Surgeon: Jules Husbands, MD;  Location: [facility] ORS;  Service: General;  Laterality: N/A;   IR PERC CHOLECYSTOSTOMY  01/25/2021   IR PERC CHOLECYSTOSTOMY  03/11/2021    Allergies  Allergen Reactions   Aspirin Other (See Comments)    GI bleed   Codeine Nausea Only and Nausea And Vomiting    Current Outpatient Medications  Medication Sig Dispense Refill   apixaban (ELIQUIS) 2.5 MG TABS tablet Take 1 tablet (2.5 mg total) by mouth 2 (two) times daily. 60 tablet 3   BD POSIFLUSH 0.9 % SOLN injection FLUSH WITH 5ML TWICE DAILY 300 mL 0   carvedilol (COREG) 6.25 MG tablet Take 1 tablet (6.25 mg total) by mouth 2 (two) times daily with a meal. 60 tablet 2   diltiazem (CARDIZEM CD) 360 MG 24 hr capsule Take 1 capsule (360 mg total) by mouth daily. 30 capsule 2   epoetin alfa (EPOGEN) 10000 UNIT/ML injection Inject 1 mL (10,000 Units total) into the vein every Monday, Wednesday, and Friday with hemodialysis. 1 mL    fenofibrate 160 MG tablet Take 160 mg by mouth at bedtime.     ferrous sulfate 325 (65 FE) MG tablet Take 325 mg by mouth 2 (two) times daily with a meal.     insulin NPH-regular Human (70-30) 100 UNIT/ML injection Hold until followup with PCP, as I don't think you need insulin anymore.     Multiple Vitamin (MULTIVITAMIN) capsule Take 1 capsule by  mouth daily.     Vitamin D, Ergocalciferol, (DRISDOL) 1.25 MG (50000 UNIT) CAPS capsule Take 1 capsule (50,000 Units total) by mouth every 7 (seven) days. 12 capsule 0   No current facility-administered medications for this visit.    Family History Family History  Problem Relation Age of Onset   Hypertension Father       Social History Social History    Tobacco Use   Smoking status: Former   Smokeless tobacco: Never  Scientific laboratory technician Use: Never used  Substance Use Topics   Alcohol use: Not Currently    Comment: rare once a year   Drug use: Never        Review of Systems  Constitutional: Negative.   HENT: Negative.    Eyes: Negative.   Respiratory: Negative.    Cardiovascular: Negative.   Gastrointestinal: Negative.   Genitourinary: Negative.   Skin: Negative.   Neurological: Negative.   Psychiatric/Behavioral: Negative.       Physical Exam There were no vitals taken for this visit.   CONSTITUTIONAL: Well developed, and nourished, frail-appearing gentleman, however appropriately responsive and aware without distress.   EYES: Sclera non-icteric.   EARS, NOSE, MOUTH AND THROAT: Mask worn.   Hearing is intact to voice.  NECK: Trachea is midline, and there is no jugular venous distension.  LYMPH NODES:  Lymph nodes in the neck are not enlarged. RESPIRATORY:  Lungs are clear, and breath sounds are equal bilaterally. Normal respiratory effort without pathologic use of accessory muscles. CARDIOVASCULAR: Heart is regular in rate and rhythm. GI: There is an intercostal right sided drain which is well secured and draining clear bilious fluid.  The abdomen is soft, nontender, and nondistended. There were no palpable masses.  MUSCULOSKELETAL:  Symmetrical muscle tone appreciated in all four extremities.    SKIN: Skin turgor is normal. No pathologic skin lesions appreciated.  NEUROLOGIC:  Motor and sensation appear grossly normal.  Cranial nerves are grossly without defect. PSYCH:  Alert and oriented to person, place and time. Affect is appropriate for situation.  Data Reviewed I have personally reviewed what is currently available of the patient's imaging, recent labs and medical records.   Labs:  CBC Latest Ref Rng & Units 03/06/2021 03/05/2021 03/04/2021  WBC 4.0 - 10.5 K/uL 6.1 7.1 6.4  Hemoglobin 13.0 - 17.0 g/dL 8.2(L)  7.9(L) 7.0(L)  Hematocrit 39.0 - 52.0 % 24.7(L) 24.0(L) 21.7(L)  Platelets 150 - 400 K/uL 160 146(L) 145(L)   CMP Latest Ref Rng & Units 03/06/2021 03/05/2021 03/04/2021  Glucose 70 - 99 mg/dL 111(H) 105(H) 118(H)  BUN 8 - 23 mg/dL 33(H) 21 36(H)  Creatinine 0.61 - 1.24 mg/dL 6.20(H) 4.47(H) 6.71(H)  Sodium 135 - 145 mmol/L 132(L) 133(L) 140  Potassium 3.5 - 5.1 mmol/L 3.2(L) 3.3(L) 4.0  Chloride 98 - 111 mmol/L 99 100 108  CO2 22 - 32 mmol/L 25 26 26   Calcium 8.9 - 10.3 mg/dL 7.9(L) 7.6(L) 7.8(L)  Total Protein 6.5 - 8.1 g/dL - - -  Total Bilirubin 0.3 - 1.2 mg/dL - - -  Alkaline Phos 38 - 126 U/L - - -  AST 15 - 41 U/L - - -  ALT 0 - 44 U/L - - -      Imaging:  Within last 24 hrs: No results found.  Assessment    Cholecystostomy status, status post acute cholecystitis. 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[place]) 02/28/2021   Protein-calorie malnutrition, severe 02/23/2021   Symptomatic anemia 02/21/2021   Cholecystitis 01/24/2021   Weakness 01/23/2021   Elevated LFTs 01/23/2021   Transaminitis 01/23/2021   MGUS (monoclonal gammopathy of unknown significance)    Thrombocytopenia (HCC)    Acute anemia 11/23/2020   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   Chronic kidney disease 05/11/2019   Hyperlipidemia 05/11/2019   Hypertension, 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   Anemia due to vitamin B12 deficiency 05/05/2019   Benign prostatic hyperplasia 05/05/2019   Acute lower GI bleeding 04/27/2019   Acute blood loss anemia 04/27/2019   Acute kidney injury superimposed on CKD ([place]) 04/27/2019   Iron deficiency anemia due to chronic blood loss 04/27/2019    Type 2 diabetes mellitus with stage 4 chronic kidney disease, without long-term current use of insulin ([place]) 10/16/2015   Essential hypertension 10/16/2015   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 This patient was seen and examined, and I concur with the H&P associated with this note.  This was discussed thoroughly.  Optimal plan is for robotic cholecystectomy.  Risks and benefits have been discussed with the patient which include but are not limited to anesthesia, bleeding, infection, biliary ductal injury or stenosis, other associated unanticipated injuries affiliated with laparoscopic surgery.   We indicated there may be a possibility he may have a drain postop however it would be located in a different location likely temporary in nature.   I believe there is the desire to proceed, interpreter utilized as needed.  Questions elicited and answered to satisfaction.  No guarantees ever expressed or implied.   Note: Biggest challenges determining whether he should be admitted preop for hemodialysis, or held postop as operating day will fall on his hemodialysis today. He has elected to schedule surgery for the 18th, and will hold Eliquis beginning the 16th.  And anticipate hemodialysis postop.  Face-to-face time spent with the patient and accompanying care providers(if present) was 35 minutes, with more than 50% of the time spent counseling, educating, and coordinating care of the patient.    These notes generated with voice recognition software. I apologize for typographical errors.  Ronny Bacon M.D.,  FACS 04/09/2021, 1:21 PM

## 2021-04-09 NOTE — Patient Instructions (Addendum)
You will need to stop your Eliquis 48 hours(2 days) prior to surgery.   You have requested to have your gallbladder removed. This will be done at [facility] with Dr. Christian Mate.  You will most likely be out of work 1-2 weeks for this surgery. You will return after your post-op appointment with a lifting restriction for approximately 4 more weeks.  You will be able to eat anything you would like to following surgery. But, start by eating a bland diet and advance this as tolerated. The Gallbladder diet is below, please go as closely by this diet as possible prior to surgery to avoid any further attacks.  Please see the (blue)pre-care form that you have been given today. If you have any questions, please call our office.   Laparoscopic Cholecystectomy Laparoscopic cholecystectomy is surgery to remove the gallbladder. The gallbladder is located in the upper right part of the abdomen, behind the liver. It is a storage sac for bile, which is produced in the liver. Bile aids in the digestion and absorption of fats. Cholecystectomy is often done for inflammation of the gallbladder (cholecystitis). This condition is usually caused by a buildup of gallstones (cholelithiasis) in the gallbladder. Gallstones can block the flow of bile, and that can result in inflammation and pain. In severe cases, emergency surgery may be required. If emergency surgery is not required, you will have time to prepare for the procedure. Laparoscopic surgery is an alternative to open surgery. Laparoscopic surgery has a shorter recovery time. Your common bile duct may also need to be examined during the procedure. If stones are found in the common bile duct, they may be removed. LET [facility] The CARE PROVIDER KNOW ABOUT: Any allergies you have. All medicines you are taking, including vitamins, herbs, eye drops, creams, and over-the-counter medicines. Previous problems you or members of your family have had with the use of  anesthetics. Any blood disorders you have. Previous surgeries you have had.  Any medical conditions you have. RISKS AND COMPLICATIONS Generally, this is a safe procedure. However, problems may occur, including: Infection. Bleeding. Allergic reactions to medicines. Damage to other structures or organs. A stone remaining in the common bile duct. A bile leak from the cyst duct that is clipped when your gallbladder is removed. The need to convert to open surgery, which requires a larger incision in the abdomen. This may be necessary if your surgeon thinks that it is not safe to continue with a laparoscopic procedure. BEFORE THE PROCEDURE Ask your health care provider about: Changing or stopping your regular medicines. This is especially important if you are taking diabetes medicines or blood thinners. Taking medicines such as aspirin and ibuprofen. These medicines can thin your blood. Do not take these medicines before your procedure if your health care provider instructs you not to. Follow instructions from your health care provider about eating or drinking restrictions. Let your health care provider know if you develop a cold or an infection before surgery. Plan to have someone take you home after the procedure. Ask your health care provider how your surgical site will be marked or identified. You may be given antibiotic medicine to help prevent infection. PROCEDURE To reduce your risk of infection: Your health care team will wash or sanitize their hands. Your skin will be washed with soap. An IV tube may be inserted into one of your veins. You will be given a medicine to make you fall asleep (general anesthetic). A breathing tube will be placed in your  mouth. The surgeon will make several small cuts (incisions) in your abdomen. A thin, lighted tube (laparoscope) that has a tiny camera on the end will be inserted through one of the small incisions. The camera on the laparoscope will send  a picture to a TV screen (monitor) in the operating room. This will give the surgeon a good view inside your abdomen. A gas will be pumped into your abdomen. This will expand your abdomen to give the surgeon more room to perform the surgery. Other tools that are needed for the procedure will be inserted through the other incisions. The gallbladder will be removed through one of the incisions. After your gallbladder has been removed, the incisions will be closed with stitches (sutures), staples, or skin glue. Your incisions may be covered with a bandage (dressing). The procedure may vary among health care providers and hospitals. AFTER THE PROCEDURE Your blood pressure, heart rate, breathing rate, and blood oxygen level will be monitored often until the medicines you were given have worn off. You will be given medicines as needed to control your pain.   This information is not intended to replace advice given to you by your health care provider. Make sure you discuss any questions you have with your health care provider.   Document Released: 03/24/2005 Document Revised: 12/13/2014 Document Reviewed: 11/03/2012 Elsevier Interactive Patient Education 2016 [place] Diet for Gallbladder Conditions A low-fat diet can be helpful if you have pancreatitis or a gallbladder condition. With these conditions, your pancreas and gallbladder have trouble digesting fats. A healthy eating plan with less fat will help rest your pancreas and gallbladder and reduce your symptoms. WHAT DO I NEED TO KNOW ABOUT THIS DIET? Eat a low-fat diet. Reduce your fat intake to less than 20-30% of your total daily calories. This is less than 50-60 g of fat per day. Remember that you need some fat in your diet. Ask your dietician what your daily goal should be. Choose nonfat and low-fat healthy foods. Look for the words "nonfat," "low fat," or "fat free." As a guide, look on the label and choose foods with less  than 3 g of fat per serving. Eat only one serving. Avoid alcohol. Do not smoke. If you need help quitting, talk with your health care provider. Eat small frequent meals instead of three large heavy meals. WHAT FOODS CAN I EAT? Grains Include healthy grains and starches such as potatoes, wheat bread, fiber-rich cereal, and brown rice. Choose whole grain options whenever possible. In adults, whole grains should account for 45-65% of your daily calories.  Fruits and Vegetables Eat plenty of fruits and vegetables. Fresh fruits and vegetables add fiber to your diet. Meats and Other Protein Sources Eat lean meat such as chicken and pork. Trim any fat off of meat before cooking it. Eggs, fish, and beans are other sources of protein. In adults, these foods should account for 10-35% of your daily calories. Dairy Choose low-fat milk and dairy options. Dairy includes fat and protein, as well as calcium.  Fats and Oils Limit high-fat foods such as fried foods, sweets, baked goods, sugary drinks.  Other Creamy sauces and condiments, such as mayonnaise, can add extra fat. Think about whether or not you need to use them, or use smaller amounts or low fat options. WHAT FOODS ARE NOT RECOMMENDED? High fat foods, such as: [employer]. Ice cream. [place] toast. Sweet rolls. Pizza. Cheese bread. Foods covered with batter, butter, creamy sauces, or cheese.  Fried foods. Sugary drinks and desserts. Foods that cause gas or bloating   This information is not intended to replace advice given to you by your health care provider. Make sure you discuss any questions you have with your health care provider.   Document Released: 03/29/2013 Document Reviewed: 03/29/2013 Elsevier Interactive Patient Education [employer].

## 2021-04-10 ENCOUNTER — Telehealth: Payer: Self-pay

## 2021-04-10 NOTE — Telephone Encounter (Signed)
PA submitted via CMM for BD PosiFlush 0.9% solution. PA was denied because:  Flushes do not meet the definition of a Medicare Part D drug because they are not used for the treatment of a medically-accepted indication. Rather, flushes are 1) used to clear intravenous (IV) lines, such as a saline flush OR 2) to dissolve possible blood clots around an intravenous (IV) line, such a a heparin flush. In these instances, the flush is being used to maintain the functionality of the medical equipment. This decision does not address the medical necessity or appropriateness of the requested drug. It only establishes that payment cannot be made under the Medicare Part D benefit. For more information, talk to your prescriber or call 1-800-MEDICARE.

## 2021-04-11 ENCOUNTER — Telehealth: Payer: Self-pay | Admitting: Surgery

## 2021-04-11 NOTE — Telephone Encounter (Signed)
Spoke with patient and his wife, they have been advised of Pre-Admission date/time, COVID Testing date and Surgery date.  Surgery Date: 04/24/21 Preadmission Testing Date: 04/16/21 (phone 1p-5p) Covid Testing Date: Not needed.     Patient has been made aware to call [phone], between 1-3:00pm the day before surgery, to find out what time to arrive for surgery.

## 2021-04-15 ENCOUNTER — Ambulatory Visit: Admit: 2021-04-15 | Payer: Medicare Other | Admitting: Surgery

## 2021-04-15 SURGERY — CHOLECYSTECTOMY, ROBOT-ASSISTED, LAPAROSCOPIC
Anesthesia: General

## 2021-04-16 ENCOUNTER — Ambulatory Visit: Payer: Self-pay | Admitting: Surgery

## 2021-04-16 ENCOUNTER — Other Ambulatory Visit: Payer: Self-pay

## 2021-04-16 ENCOUNTER — Other Ambulatory Visit
Admission: RE | Admit: 2021-04-16 | Discharge: 2021-04-16 | Disposition: A | Discharge status: Home / Self Care | Payer: Medicare Other | Source: Ambulatory Visit | Attending: Surgery | Admitting: Surgery

## 2021-04-16 DIAGNOSIS — K8010 Calculus of gallbladder with chronic cholecystitis without obstruction: Secondary | ICD-10-CM

## 2021-04-16 HISTORY — DX: Chronic kidney disease, unspecified: N18.9

## 2021-04-16 HISTORY — DX: Anemia, unspecified: D64.9

## 2021-04-16 HISTORY — DX: Personal history of urinary calculi: Z87.442

## 2021-04-16 HISTORY — DX: Essential (primary) hypertension: I10

## 2021-04-16 NOTE — Patient Instructions (Addendum)
Your procedure is scheduled on: 04/24/21 - Wednesday Report to the Registration Desk on the 1st floor of the [facility]. To find out your arrival time, please call [phone] between 1PM - 3PM on: 04/23/21 Tuesday.  REMEMBER: Instructions that are not followed completely may result in serious medical risk, up to and including death; or upon the discretion of your surgeon and anesthesiologist your surgery may need to be rescheduled.  Do not eat food or drink any fluids after midnight the night before surgery.  No gum chewing, lozengers or hard candies.  TAKE THESE MEDICATIONS THE MORNING OF SURGERY WITH A SIP OF WATER:  - carvedilol (COREG) 3.125 MG tablet - diltiazem (CARDIZEM CD) 360 MG 24 hr capsule   Follow recommendations from Cardiologist, Pulmonologist or PCP regarding stopping Aspirin, Coumadin, Plavix, Eliquis, Pradaxa, or Pletal. Stop taking apixaban (ELIQUIS) 2.5 MG TABS tablet beginning 04/22/21.  One week prior to surgery: Stop Anti-inflammatories (NSAIDS) such as Advil, Aleve, Ibuprofen, Motrin, Naproxen, Naprosyn and Aspirin based products such as Excedrin, Goodys Powder, BC Powder.  Stop ANY OVER THE COUNTER supplements until after surgery.  You may take Tylenol if needed for pain up until the day of surgery.  No Alcohol for 24 hours before or after surgery.  No Smoking including e-cigarettes for 24 hours prior to surgery.  No chewable tobacco products for at least 6 hours prior to surgery.  No nicotine patches on the day of surgery.  Do not use any "recreational" drugs for at least a week prior to your surgery.  Please be advised that the combination of cocaine and anesthesia may have negative outcomes, up to and including death. If you test positive for cocaine, your surgery will be cancelled.  On the morning of surgery brush your teeth with toothpaste and water, you may rinse your mouth with mouthwash if you wish. Do not swallow any toothpaste or  mouthwash.  Do not wear jewelry, make-up, hairpins, clips or nail polish.  Do not wear lotions, powders, or perfumes.   Do not shave body from the neck down 48 hours prior to surgery just in case you cut yourself which could leave a site for infection.  Also, freshly shaved skin may become irritated if using the CHG soap.  Contact lenses, hearing aids and dentures may not be worn into surgery.  Do not bring valuables to the hospital. [facility] is not responsible for any missing/lost belongings or valuables.   Notify your doctor if there is any change in your medical condition (cold, fever, infection).  Wear comfortable clothing (specific to your surgery type) to the hospital.  After surgery, you can help prevent lung complications by doing breathing exercises.  Take deep breaths and cough every 1-2 hours. Your doctor may order a device called an Incentive Spirometer to help you take deep breaths. When coughing or sneezing, hold a pillow firmly against your incision with both hands. This is called splinting. Doing this helps protect your incision. It also decreases belly discomfort.  If you are being admitted to the hospital overnight, leave your suitcase in the car. After surgery it may be brought to your room.  If you are being discharged the day of surgery, you will not be allowed to drive home. You will need a responsible adult (18 years or older) to drive you home and stay with you that night.   If you are taking public transportation, you will need to have a responsible adult (18 years or older) with you.  Please confirm with your physician that it is acceptable to use public transportation.   Please call the [facility] at [phone] if you have any questions about these instructions.  Surgery Visitation Policy:  Patients undergoing a surgery or procedure may have one family member or support person with them as long as that person is not COVID-19  positive or experiencing its symptoms.  That person may remain in the waiting area during the procedure and may rotate out with other people.  Inpatient Visitation:    Visiting hours are 7 a.m. to 8 p.m. Up to two visitors ages 16+ are allowed at one time in a patient room. The visitors may rotate out with other people during the day. Visitors must check out when they leave, or other visitors will not be allowed. One designated support person may remain overnight. The visitor must pass COVID-19 screenings, use hand sanitizer when entering and exiting the patients room and wear a mask at all times, including in the patients room. Patients must also wear a mask when staff or their visitor are in the room. Masking is required regardless of vaccination status.

## 2021-04-22 ENCOUNTER — Other Ambulatory Visit: Payer: Medicare Other

## 2021-04-23 MED ORDER — CHLORHEXIDINE GLUCONATE 0.12 % MT SOLN: 15.0000 mL | Freq: Once | OROMUCOSAL | Status: AC

## 2021-04-23 MED ORDER — CHLORHEXIDINE GLUCONATE CLOTH 2 % EX PADS
6.0000 | MEDICATED_PAD | Freq: Once | CUTANEOUS | Status: AC
  Administered 2021-04-24: 6 via TOPICAL

## 2021-04-23 MED ORDER — INDOCYANINE GREEN 25 MG IV SOLR
2.5000 mg | Freq: Once | INTRAVENOUS | Status: AC
  Administered 2021-04-24: 2.5 mg via INTRAVENOUS
  Filled 2021-04-23: qty 1

## 2021-04-23 MED ORDER — FAMOTIDINE 20 MG PO TABS: 20.0000 mg | ORAL_TABLET | Freq: Once | ORAL | Status: AC

## 2021-04-23 MED ORDER — CEFAZOLIN SODIUM-DEXTROSE 2-4 GM/100ML-% IV SOLN
2.0000 g | INTRAVENOUS | Status: AC
  Administered 2021-04-24: 2 g via INTRAVENOUS

## 2021-04-23 MED ORDER — ORAL CARE MOUTH RINSE: 15.0000 mL | Freq: Once | OROMUCOSAL | Status: AC

## 2021-04-23 MED ORDER — GABAPENTIN 300 MG PO CAPS: 300.0000 mg | ORAL_CAPSULE | ORAL | Status: AC

## 2021-04-23 MED ORDER — ACETAMINOPHEN 500 MG PO TABS: 1000.0000 mg | ORAL_TABLET | ORAL | Status: AC

## 2021-04-23 MED ORDER — BUPIVACAINE LIPOSOME 1.3 % IJ SUSP: 20.0000 mL | Freq: Once | INTRAMUSCULAR | Status: DC

## 2021-04-24 ENCOUNTER — Other Ambulatory Visit: Payer: Self-pay | Admitting: Radiology

## 2021-04-24 ENCOUNTER — Encounter: Payer: Self-pay | Admitting: Surgery

## 2021-04-24 ENCOUNTER — Encounter
Admission: RE | Disposition: A | Discharge status: Home / Self Care | Payer: Self-pay | Source: Home / Self Care | Attending: Surgery

## 2021-04-24 ENCOUNTER — Ambulatory Visit: Payer: Medicare Other | Admitting: Anesthesiology

## 2021-04-24 ENCOUNTER — Ambulatory Visit
Admission: RE | Admit: 2021-04-24 | Discharge: 2021-04-24 | Disposition: A | Discharge status: Home / Self Care | Payer: Medicare Other | Attending: Surgery | Admitting: Surgery

## 2021-04-24 ENCOUNTER — Other Ambulatory Visit: Payer: Self-pay

## 2021-04-24 DIAGNOSIS — I120 Hypertensive chronic kidney disease with stage 5 chronic kidney disease or end stage renal disease: Secondary | ICD-10-CM | POA: Diagnosis not present

## 2021-04-24 DIAGNOSIS — Z87891 Personal history of nicotine dependence: Secondary | ICD-10-CM | POA: Diagnosis not present

## 2021-04-24 DIAGNOSIS — E1122 Type 2 diabetes mellitus with diabetic chronic kidney disease: Secondary | ICD-10-CM | POA: Insufficient documentation

## 2021-04-24 DIAGNOSIS — Z79899 Other long term (current) drug therapy: Secondary | ICD-10-CM | POA: Insufficient documentation

## 2021-04-24 DIAGNOSIS — I4891 Unspecified atrial fibrillation: Secondary | ICD-10-CM | POA: Diagnosis not present

## 2021-04-24 DIAGNOSIS — K811 Chronic cholecystitis: Secondary | ICD-10-CM | POA: Diagnosis present

## 2021-04-24 DIAGNOSIS — Z794 Long term (current) use of insulin: Secondary | ICD-10-CM | POA: Diagnosis not present

## 2021-04-24 DIAGNOSIS — K8010 Calculus of gallbladder with chronic cholecystitis without obstruction: Secondary | ICD-10-CM

## 2021-04-24 DIAGNOSIS — Z7901 Long term (current) use of anticoagulants: Secondary | ICD-10-CM | POA: Diagnosis not present

## 2021-04-24 DIAGNOSIS — K219 Gastro-esophageal reflux disease without esophagitis: Secondary | ICD-10-CM | POA: Insufficient documentation

## 2021-04-24 LAB — COMPREHENSIVE METABOLIC PANEL
ALT: 9 U/L (ref 0–44)
AST: 29 U/L (ref 15–41)
Albumin: 2.2 g/dL — ABNORMAL LOW (ref 3.5–5.0)
Alkaline Phosphatase: 37 U/L — ABNORMAL LOW (ref 38–126)
Anion gap: 8 (ref 5–15)
BUN: 18 mg/dL (ref 8–23)
CO2: 23 mmol/L (ref 22–32)
Calcium: 7.8 mg/dL — ABNORMAL LOW (ref 8.9–10.3)
Chloride: 102 mmol/L (ref 98–111)
Creatinine, Ser: 3.86 mg/dL — ABNORMAL HIGH (ref 0.61–1.24)
GFR, Estimated: 15 mL/min — ABNORMAL LOW (ref >60)
Glucose, Bld: 129 mg/dL — ABNORMAL HIGH (ref 70–99)
Potassium: 3.1 mmol/L — ABNORMAL LOW (ref 3.5–5.1)
Sodium: 133 mmol/L — ABNORMAL LOW (ref 135–145)
Total Bilirubin: 0.6 mg/dL (ref 0.3–1.2)
Total Protein: 5.7 g/dL — ABNORMAL LOW (ref 6.5–8.1)

## 2021-04-24 LAB — CBC WITH DIFFERENTIAL/PLATELET
Abs Immature Granulocytes: 0.07 10*3/uL (ref 0.00–0.07)
Basophils Absolute: 0 10*3/uL (ref 0.0–0.1)
Basophils Relative: 0 %
Eosinophils Absolute: 0 10*3/uL (ref 0.0–0.5)
Eosinophils Relative: 0 %
HCT: 30.4 % — ABNORMAL LOW (ref 39.0–52.0)
Hemoglobin: 9.4 g/dL — ABNORMAL LOW (ref 13.0–17.0)
Immature Granulocytes: 1 %
Lymphocytes Relative: 19 %
Lymphs Abs: 1.3 10*3/uL (ref 0.7–4.0)
MCH: 28.7 pg (ref 26.0–34.0)
MCHC: 30.9 g/dL (ref 30.0–36.0)
MCV: 92.7 fL (ref 80.0–100.0)
Monocytes Absolute: 0.3 10*3/uL (ref 0.1–1.0)
Monocytes Relative: 5 %
Neutro Abs: 5.2 10*3/uL (ref 1.7–7.7)
Neutrophils Relative %: 75 %
Platelets: 159 10*3/uL (ref 150–400)
RBC: 3.28 MIL/uL — ABNORMAL LOW (ref 4.22–5.81)
RDW: 16.4 % — ABNORMAL HIGH (ref 11.5–15.5)
WBC: 6.9 10*3/uL (ref 4.0–10.5)
nRBC: 0 % (ref 0.0–0.2)

## 2021-04-24 LAB — GLUCOSE, CAPILLARY: Glucose-Capillary: 128 mg/dL — ABNORMAL HIGH (ref 70–99)

## 2021-04-24 SURGERY — CHOLECYSTECTOMY, ROBOT-ASSISTED, LAPAROSCOPIC
Anesthesia: General

## 2021-04-24 MED ORDER — FENTANYL CITRATE (PF) 100 MCG/2ML IJ SOLN
25.0000 ug | INTRAMUSCULAR | Status: DC | PRN
  Administered 2021-04-24 (×2): 25 ug via INTRAVENOUS

## 2021-04-24 MED ORDER — HYDROCODONE-ACETAMINOPHEN 5-325 MG PO TABS: 1.0000 | ORAL_TABLET | Freq: Four times a day (QID) | ORAL | 0 refills | Status: AC | PRN

## 2021-04-24 MED ORDER — BUPIVACAINE-EPINEPHRINE (PF) 0.25% -1:200000 IJ SOLN
INTRAMUSCULAR | Status: DC | PRN
  Administered 2021-04-24: 30 mL

## 2021-04-24 MED ORDER — LIDOCAINE HCL (CARDIAC) PF 100 MG/5ML IV SOSY
PREFILLED_SYRINGE | INTRAVENOUS | Status: DC | PRN
  Administered 2021-04-24: 100 mg via INTRAVENOUS

## 2021-04-24 MED ORDER — SODIUM CHLORIDE 0.9 % IR SOLN
Status: DC | PRN
  Administered 2021-04-24: 3000 mL

## 2021-04-24 MED ORDER — PROPOFOL 10 MG/ML IV BOLUS
INTRAVENOUS | Status: DC | PRN
  Administered 2021-04-24: 100 mg via INTRAVENOUS

## 2021-04-24 MED ORDER — SUGAMMADEX SODIUM 500 MG/5ML IV SOLN
INTRAVENOUS | Status: DC | PRN
  Administered 2021-04-24: 200 mg via INTRAVENOUS

## 2021-04-24 MED ORDER — ONDANSETRON HCL 4 MG/2ML IJ SOLN
INTRAMUSCULAR | Status: DC | PRN
Start: 2021-04-24 — End: 2021-04-24
  Administered 2021-04-24: 4 mg via INTRAVENOUS

## 2021-04-24 MED ORDER — FAMOTIDINE 20 MG PO TABS
ORAL_TABLET | ORAL | Status: AC
  Administered 2021-04-24: 20 mg via ORAL
  Filled 2021-04-24: qty 1

## 2021-04-24 MED ORDER — FENTANYL CITRATE (PF) 100 MCG/2ML IJ SOLN
INTRAMUSCULAR | Status: AC
  Filled 2021-04-24: qty 2

## 2021-04-24 MED ORDER — PROPOFOL 500 MG/50ML IV EMUL
INTRAVENOUS | Status: AC
  Filled 2021-04-24: qty 50

## 2021-04-24 MED ORDER — FENTANYL CITRATE (PF) 100 MCG/2ML IJ SOLN
INTRAMUSCULAR | Status: DC | PRN
  Administered 2021-04-24 (×2): 50 ug via INTRAVENOUS

## 2021-04-24 MED ORDER — LACTATED RINGERS IV SOLN: INTRAVENOUS | Status: DC | PRN

## 2021-04-24 MED ORDER — ROCURONIUM BROMIDE 100 MG/10ML IV SOLN
INTRAVENOUS | Status: DC | PRN
  Administered 2021-04-24: 50 mg via INTRAVENOUS

## 2021-04-24 MED ORDER — CHLORHEXIDINE GLUCONATE 0.12 % MT SOLN
OROMUCOSAL | Status: AC
  Administered 2021-04-24: 15 mL via OROMUCOSAL
  Filled 2021-04-24: qty 15

## 2021-04-24 MED ORDER — ACETAMINOPHEN 500 MG PO TABS
ORAL_TABLET | ORAL | Status: AC
  Filled 2021-04-24: qty 1

## 2021-04-24 MED ORDER — SUCCINYLCHOLINE CHLORIDE 200 MG/10ML IV SOSY
PREFILLED_SYRINGE | INTRAVENOUS | Status: DC | PRN
  Administered 2021-04-24: 100 mg via INTRAVENOUS

## 2021-04-24 MED ORDER — CEFAZOLIN SODIUM-DEXTROSE 2-4 GM/100ML-% IV SOLN
INTRAVENOUS | Status: AC
  Filled 2021-04-24: qty 100

## 2021-04-24 MED ORDER — GABAPENTIN 300 MG PO CAPS
ORAL_CAPSULE | ORAL | Status: AC
  Administered 2021-04-24: 300 mg via ORAL
  Filled 2021-04-24: qty 1

## 2021-04-24 MED ORDER — ACETAMINOPHEN 500 MG PO TABS
ORAL_TABLET | ORAL | Status: AC
  Administered 2021-04-24: 1000 mg via ORAL
  Filled 2021-04-24: qty 2

## 2021-04-24 MED ORDER — PROPOFOL 10 MG/ML IV BOLUS
INTRAVENOUS | Status: AC
  Filled 2021-04-24: qty 20

## 2021-04-24 MED ORDER — METOPROLOL TARTRATE 5 MG/5ML IV SOLN
INTRAVENOUS | Status: DC | PRN
Start: 2021-04-24 — End: 2021-04-24
  Administered 2021-04-24: 5 mg via INTRAVENOUS

## 2021-04-24 MED ORDER — PHENYLEPHRINE HCL-NACL 20-0.9 MG/250ML-% IV SOLN
INTRAVENOUS | Status: DC | PRN
Start: 2021-04-24 — End: 2021-04-24
  Administered 2021-04-24: 40 ug/min via INTRAVENOUS

## 2021-04-24 MED ORDER — FENTANYL CITRATE (PF) 100 MCG/2ML IJ SOLN
INTRAMUSCULAR | Status: AC
  Administered 2021-04-24: 25 ug via INTRAVENOUS
  Filled 2021-04-24: qty 2

## 2021-04-24 MED ORDER — BUPIVACAINE-EPINEPHRINE (PF) 0.25% -1:200000 IJ SOLN
INTRAMUSCULAR | Status: AC
  Filled 2021-04-24: qty 30

## 2021-04-24 SURGICAL SUPPLY — 47 items
ADH SKN CLS APL DERMABOND .7 (GAUZE/BANDAGES/DRESSINGS) ×1
BAG SPEC RTRVL LRG 6X4 10 (ENDOMECHANICALS) ×1
CANNULA CAP OBTURATR AIRSEAL 8 (CAP) ×2 IMPLANT
CLIP LIGATING HEM O LOK PURPLE (MISCELLANEOUS) ×3 IMPLANT
COVER TIP SHEARS 8 DVNC (MISCELLANEOUS) ×1 IMPLANT
COVER TIP SHEARS 8MM DA VINCI (MISCELLANEOUS) ×1
DECANTER SPIKE VIAL GLASS SM (MISCELLANEOUS) ×2 IMPLANT
DERMABOND ADVANCED (GAUZE/BANDAGES/DRESSINGS) ×1
DERMABOND ADVANCED .7 DNX12 (GAUZE/BANDAGES/DRESSINGS) ×1 IMPLANT
DRAPE ARM DVNC X/XI (DISPOSABLE) ×4 IMPLANT
DRAPE COLUMN DVNC XI (DISPOSABLE) ×1 IMPLANT
DRAPE DA VINCI XI ARM (DISPOSABLE) ×4
DRAPE DA VINCI XI COLUMN (DISPOSABLE) ×1
ELECT CAUTERY BLADE 6.4 (BLADE) ×2 IMPLANT
GAUZE 4X4 16PLY ~~LOC~~+RFID DBL (SPONGE) ×2 IMPLANT
GLOVE SURG ORTHO LTX SZ7.5 (GLOVE) ×9 IMPLANT
GOWN STRL REUS W/ TWL LRG LVL3 (GOWN DISPOSABLE) ×4 IMPLANT
GOWN STRL REUS W/TWL LRG LVL3 (GOWN DISPOSABLE) ×8
GRASPER SUT TROCAR 14GX15 (MISCELLANEOUS) IMPLANT
INFUSOR MANOMETER BAG 3000ML (MISCELLANEOUS) IMPLANT
IRRIGATION STRYKERFLOW (MISCELLANEOUS) IMPLANT
IRRIGATOR STRYKERFLOW (MISCELLANEOUS) ×2
IRRIGATOR SUCT 8 DISP DVNC XI (IRRIGATION / IRRIGATOR) IMPLANT
IRRIGATOR SUCTION 8MM XI DISP (IRRIGATION / IRRIGATOR)
IV NS IRRIG 3000ML ARTHROMATIC (IV SOLUTION) ×1 IMPLANT
KIT PINK PAD W/HEAD ARE REST (MISCELLANEOUS) ×2
KIT PINK PAD W/HEAD ARM REST (MISCELLANEOUS) ×1 IMPLANT
KIT TURNOVER KIT A (KITS) ×2 IMPLANT
LABEL OR SOLS (LABEL) ×2 IMPLANT
MANIFOLD NEPTUNE II (INSTRUMENTS) ×2 IMPLANT
NDL INSUFFLATION 14GA 120MM (NEEDLE) IMPLANT
NEEDLE HYPO 22GX1.5 SAFETY (NEEDLE) ×2 IMPLANT
NEEDLE INSUFFLATION 14GA 120MM (NEEDLE) IMPLANT
NS IRRIG 500ML POUR BTL (IV SOLUTION) ×2 IMPLANT
PACK LAP CHOLECYSTECTOMY (MISCELLANEOUS) ×2 IMPLANT
PENCIL ELECTRO HAND CTR (MISCELLANEOUS) ×2 IMPLANT
POUCH SPECIMEN RETRIEVAL 10MM (ENDOMECHANICALS) ×2 IMPLANT
SEAL CANN UNIV 5-8 DVNC XI (MISCELLANEOUS) ×3 IMPLANT
SEAL XI 5MM-8MM UNIVERSAL (MISCELLANEOUS) ×3
SET TUBE FILTERED XL AIRSEAL (SET/KITS/TRAYS/PACK) ×2 IMPLANT
SOLUTION ELECTROLUBE (MISCELLANEOUS) ×2 IMPLANT
SUT MNCRL 4-0 (SUTURE) ×2
SUT MNCRL 4-0 27XMFL (SUTURE) ×1
SUT VICRYL 0 AB UR-6 (SUTURE) ×2 IMPLANT
SUTURE MNCRL 4-0 27XMF (SUTURE) ×1 IMPLANT
TROCAR Z-THREAD FIOS 11X100 BL (TROCAR) IMPLANT
WATER STERILE IRR 500ML POUR (IV SOLUTION) ×1 IMPLANT

## 2021-04-24 NOTE — Discharge Instructions (Signed)

## 2021-04-24 NOTE — Op Note (Signed)
Robotic cholecystectomy with Indocyamine Green Ductal Imaging.   Pre-operative Diagnosis: History of acute calculus cholecystitis, secondary sepsis, and status post cholecystostomy drain.  Post-operative Diagnosis:  Same.  Procedure: Robotic assisted laparoscopic cholecystectomy with Indocyamine Green Ductal Imaging.   Surgeon: Ronny Bacon, M.D., FACS  Anesthesia: General. with endotracheal tube  Findings: As expected, intercostal drain, transhepatic.  Extensive scarring of the infundibulum with a large stone in that location.  Friable thin gallbladder wall without acute active inflammation.  Estimated Blood Loss: 25 mL         Drains: None         Specimens: Gallbladder           Complications: none  Procedure Details  The patient was seen again in the Holding Room.  2.5 mg dose of ICG was administered intravenously.   The benefits, complications, treatment options, risks and expected outcomes were again reviewed with the patient. The likelihood of improving the patient's symptoms with return to their baseline status is good.  The patient and/or family concurred with the proposed plan, giving informed consent, again alternatives reviewed.  The patient was taken to Operating Room, identified, and the procedure verified as robotic assisted laparoscopic cholecystectomy.  Prior to the induction of general anesthesia, antibiotic prophylaxis was administered. VTE prophylaxis was in place. General endotracheal anesthesia was then administered and tolerated well. The patient was positioned in the supine position.  After the induction, the abdomen was prepped with Chloraprep and draped in the sterile fashion.  A Time Out was held and the above information confirmed.  After local infiltration of quarter percent Marcaine with epinephrine, stab incision was made left upper quadrant.  Just below the costal margin at Palmer's point, approximately midclavicular line the Veres needle is passed with  sensation of the layers to penetrate the abdominal wall and into the peritoneum.  Saline drop test is confirmed peritoneal placement.  Insufflation is initiated with carbon dioxide to pressures of 15 mmHg.  Right infra-umbilical local infiltration with quarter percent Marcaine with epinephrine is utilized.  Made a 12 mm incision on the right periumbilical site, I advanced an optical 18mm port under direct visualization into the peritoneal cavity.  Once the peritoneum was penetrated, insufflation was initiated.  The trocar was then advanced into the abdominal cavity under direct visualization. Pneumoperitoneum was then continued with Air seal utilizing CO2 at 15 mmHg or less and tolerated well without any adverse changes in the patient's vital signs.  Two 8.5-mm ports were placed in the left lower quadrant and laterally, and one to the right lower quadrant, all under direct vision. All skin incisions  were infiltrated with a local anesthetic agent before making the incision and placing the trocars.  The patient was positioned  in reverse Trendelenburg, tilted the patient's left side down.  Da Vinci XI robot was then positioned on to the patient's left side, and docked.  The gallbladder was identified, the fundus was suspended by the retracted gallbladder secondary to the cholecystostomy tube.  Adhesions were lysed with scissors and cautery.   I elected to dissected out the cholecystostomy tube and divided at the chest wall internally to allow for better mobilization of the right lobe of the liver with improved retraction of the gallbladder.  Extensive dissection was necessary due to all the adhesions and the changes in anatomy secondary to scarring.  The infundibulum was eventually identified grasped and retracted laterally, exposing the peritoneum overlying the triangle of Calot. This was then opened and dissected using  cautery & scissors. An extended critical view of the cystic duct and cystic artery was  obtained, aided by the ICG via FireFly which enabled ready visualization of the ductal anatomy.    The cystic duct was clearly identified and dissected to isolation.   Artery well isolated and clipped, and the cystic duct was triple clipped and divided with scissors, as close to the gallbladder neck as feasible, thus leaving two on the remaining stumps.  The specimen side of the artery is sealed with bipolar and divided with monopolar scissors.   The gallbladder was taken from the gallbladder fossa in both top down from earlier dissection, and now in retrograde fashion with the electrocautery. The gallbladder was removed and placed in an Endocatch bag.  The liver bed is inspected. Hemostasis was confirmed.  The robot was undocked and moved away from the operative field. The gallbladder and Endocatch sac were then removed through the infraumbilical port site.   Inspection of the right upper quadrant was performed. No bleeding, bile duct injury or leak, or bowel injury was noted. The infra-umbilical port site fascia was closed with interrumpted 0 Vicryl sutures using PMI/cone under direct visualization. 3 L of normal saline solution irrigation was utilized and was aspirated clear.   Pneumoperitoneum was released and ports removed.  4-0 subcuticular Monocryl was used to close the skin. Dermabond was  applied.  The patient was then extubated and brought to the recovery room in stable condition. Sponge, lap, and needle counts were correct at closure and at the conclusion of the case.               Ronny Bacon, M.D., [facility] 04/24/2021 1:24 PM

## 2021-04-24 NOTE — Anesthesia Preprocedure Evaluation (Signed)
Anesthesia Evaluation  Patient identified by MRN, date of birth, ID band Patient awake    Reviewed: Allergy & Precautions, NPO status , Patient's Chart, lab work & pertinent test results  History of Anesthesia Complications Negative for: history of anesthetic complications  Airway Mallampati: III  TM Distance: >3 FB Neck ROM: full    Dental  (+) Missing   Pulmonary neg shortness of breath, former smoker,    Pulmonary exam normal        Cardiovascular hypertension, (-) angina+ dysrhythmias Atrial Fibrillation  Rhythm:irregular Rate:Tachycardia     Neuro/Psych  Neuromuscular disease negative psych ROS   GI/Hepatic negative GI ROS, Neg liver ROS, neg GERD  ,  Endo/Other  diabetes, Type 2  Renal/GU DialysisRenal disease     Musculoskeletal   Abdominal   Peds  Hematology negative hematology ROS (+)   Anesthesia Other Findings Past Medical History: No date: Actinic keratosis No date: Anemia 10/21/2017: Basal cell carcinoma     Comment:  left lateral neck, excised 02/09/2018 05/12/2019: Basal cell carcinoma (BCC)     Comment:  right posterior ear, excised 05/31/2019 No date: Blood transfusion without reported diagnosis No date: Chronic kidney disease No date: Diabetes mellitus without complication (HCC) No date: Dysrhythmia No date: History of kidney stones No date: Hypertension  Past Surgical History: 02/24/2021: COLONOSCOPY; N/A     Comment:  Procedure: COLONOSCOPY;  Surgeon: [name], MD;                Location: [facility] ENDOSCOPY;  Service: Endoscopy;                Laterality: N/A; 04/29/2019: COLONOSCOPY WITH PROPOFOL; N/A     Comment:  Procedure: COLONOSCOPY WITH PROPOFOL;  Surgeon: Jonathon Bellows, MD;  Location: [facility] ENDOSCOPY;  Service:               Gastroenterology;  Laterality: N/A; 01/28/2021: DIALYSIS/PERMA CATHETER INSERTION; Bilateral     Comment:  Procedure: DIALYSIS/PERMA CATHETER  INSERTION;  Surgeon:               Algernon Huxley, MD;  Location: [facility] LAB;                Service: Cardiovascular;  Laterality: Bilateral; 02/24/2021: ESOPHAGOGASTRODUODENOSCOPY; N/A     Comment:  Procedure: ESOPHAGOGASTRODUODENOSCOPY (EGD);  Surgeon:               [name], MD;  Location: [facility] ENDOSCOPY;  Service:               Endoscopy;  Laterality: N/A; 04/29/2019: ESOPHAGOGASTRODUODENOSCOPY (EGD) WITH PROPOFOL; N/A     Comment:  Procedure: ESOPHAGOGASTRODUODENOSCOPY (EGD) WITH               PROPOFOL;  Surgeon: Jonathon Bellows, MD;  Location: [facility]               ENDOSCOPY;  Service: Gastroenterology;  Laterality: N/A; 03/02/2021: HEMORRHOID SURGERY; N/A     Comment:  Procedure: HEMORRHOIDECTOMY;  Surgeon: Jules Husbands,               MD;  Location: [facility] ORS;  Service: General;  Laterality:               N/A; 01/25/2021: IR PERC CHOLECYSTOSTOMY 03/11/2021: IR PERC CHOLECYSTOSTOMY No date: TONSILLECTOMY     Reproductive/Obstetrics negative OB ROS  Anesthesia Physical Anesthesia Plan  ASA: 4  Anesthesia Plan: General ETT   Post-op Pain Management:    Induction: Intravenous  PONV Risk Score and Plan: Ondansetron, Dexamethasone, Midazolam and Treatment may vary due to age or medical condition  Airway Management Planned: Oral ETT  Additional Equipment:   Intra-op Plan:   Post-operative Plan: Extubation in OR  Informed Consent: I have reviewed the patients History and Physical, chart, labs and discussed the procedure including the risks, benefits and alternatives for the proposed anesthesia with the patient or authorized representative who has indicated his/her understanding and acceptance.     Dental Advisory Given  Plan Discussed with: Anesthesiologist, CRNA and Surgeon  Anesthesia Plan Comments: (Patient consented for risks of anesthesia including but not limited to:  - adverse reactions to  medications - damage to eyes, teeth, lips or other oral mucosa - nerve damage due to positioning  - sore throat or hoarseness - Damage to heart, brain, nerves, lungs, other parts of body or loss of life  Patient voiced understanding.)        Anesthesia Quick Evaluation

## 2021-04-24 NOTE — Interval H&P Note (Signed)
History and Physical Interval Note:  04/24/2021 10:27 AM  Gregory Dixon  has presented today for surgery, with the diagnosis of cholecystitis, acute.  The various methods of treatment have been discussed with the patient and family. After consideration of risks, benefits and other options for treatment, the patient has consented to  Procedure(s): XI ROBOTIC ASSISTED LAPAROSCOPIC CHOLECYSTECTOMY (N/A) [place] (ICG) (N/A) as a surgical intervention.  The patient's history has been reviewed, patient examined, no change in status, stable for surgery.  I have reviewed the patient's chart and labs.  Questions were answered to the patient's satisfaction.     Ronny Bacon

## 2021-04-24 NOTE — Anesthesia Procedure Notes (Signed)
Procedure Name: Intubation Date/Time: 04/24/2021 11:26 AM Performed by: Lesle Reek, CRNA Pre-anesthesia Checklist: Patient identified, Patient being monitored, Timeout performed, Emergency Drugs available and Suction available Patient Re-evaluated:Patient Re-evaluated prior to induction Oxygen Delivery Method: Circle system utilized Preoxygenation: Pre-oxygenation with 100% oxygen Induction Type: IV induction Ventilation: Mask ventilation without difficulty Laryngoscope Size: Mac and 3 Grade View: Grade I Tube type: Oral Tube size: 7.5 mm Number of attempts: 1 Airway Equipment and Method: Stylet Placement Confirmation: ETT inserted through vocal cords under direct vision, positive ETCO2 and breath sounds checked- equal and bilateral Secured at: 22 cm Tube secured with: Tape Dental Injury: Teeth and Oropharynx as per pre-operative assessment

## 2021-04-24 NOTE — Transfer of Care (Signed)
Immediate Anesthesia Transfer of Care Note  Patient: Gregory Dixon  Procedure(s) Performed: XI ROBOTIC ASSISTED LAPAROSCOPIC CHOLECYSTECTOMY INDOCYANINE GREEN FLUORESCENCE IMAGING (ICG)  Patient Location: PACU  Anesthesia Type:General  Level of Consciousness: awake and alert   Airway & Oxygen Therapy: Patient Spontanous Breathing  Post-op Assessment: Report given to RN  Post vital signs: Reviewed and stable  Last Vitals:  Vitals Value Taken Time  BP    Temp    Pulse    Resp    SpO2      Last Pain:  Vitals:   04/24/21 1033  PainSc: 3          Complications: No notable events documented.

## 2021-04-24 NOTE — Anesthesia Postprocedure Evaluation (Signed)
Anesthesia Post Note  Patient: Gregory Dixon  Procedure(s) Performed: XI ROBOTIC ASSISTED LAPAROSCOPIC CHOLECYSTECTOMY INDOCYANINE GREEN FLUORESCENCE IMAGING (ICG)  Patient location during evaluation: PACU Anesthesia Type: General Level of consciousness: awake and alert Pain management: pain level controlled Vital Signs Assessment: post-procedure vital signs reviewed and stable Respiratory status: spontaneous breathing, nonlabored ventilation, respiratory function stable and patient connected to nasal cannula oxygen Cardiovascular status: blood pressure returned to baseline and stable Postop Assessment: no apparent nausea or vomiting Anesthetic complications: no   No notable events documented.   Last Vitals:  Vitals:   04/24/21 1415 04/24/21 1430  BP: (!) 101/44 (!) 103/59  Pulse: 87 97  Resp: 19 18  Temp:    SpO2: 96% 97%    Last Pain:  Vitals:   04/24/21 1415  PainSc: 6                  Precious Haws Izell Labat

## 2021-04-25 ENCOUNTER — Ambulatory Visit: Admission: RE | Admit: 2021-04-25 | Payer: Medicare Other | Source: Ambulatory Visit | Admitting: Radiology

## 2021-04-25 LAB — SURGICAL PATHOLOGY

## 2021-05-05 ENCOUNTER — Emergency Department: Payer: Medicare Other

## 2021-05-05 ENCOUNTER — Inpatient Hospital Stay: Payer: Medicare Other

## 2021-05-05 ENCOUNTER — Other Ambulatory Visit: Payer: Self-pay

## 2021-05-05 ENCOUNTER — Inpatient Hospital Stay
Admission: RE | Admit: 2021-05-05 | Discharge: 2021-05-08 | DRG: 064 | Disposition: E | Payer: Medicare Other | Attending: Internal Medicine | Admitting: Internal Medicine

## 2021-05-05 ENCOUNTER — Encounter: Payer: Self-pay | Admitting: Emergency Medicine

## 2021-05-05 DIAGNOSIS — Z20822 Contact with and (suspected) exposure to covid-19: Secondary | ICD-10-CM | POA: Diagnosis present

## 2021-05-05 DIAGNOSIS — K922 Gastrointestinal hemorrhage, unspecified: Secondary | ICD-10-CM | POA: Diagnosis present

## 2021-05-05 DIAGNOSIS — N186 End stage renal disease: Secondary | ICD-10-CM | POA: Diagnosis present

## 2021-05-05 DIAGNOSIS — I120 Hypertensive chronic kidney disease with stage 5 chronic kidney disease or end stage renal disease: Secondary | ICD-10-CM | POA: Diagnosis present

## 2021-05-05 DIAGNOSIS — E876 Hypokalemia: Secondary | ICD-10-CM | POA: Diagnosis present

## 2021-05-05 DIAGNOSIS — I10 Essential (primary) hypertension: Secondary | ICD-10-CM

## 2021-05-05 DIAGNOSIS — I330 Acute and subacute infective endocarditis: Secondary | ICD-10-CM | POA: Diagnosis present

## 2021-05-05 DIAGNOSIS — D472 Monoclonal gammopathy: Secondary | ICD-10-CM | POA: Diagnosis present

## 2021-05-05 DIAGNOSIS — Z992 Dependence on renal dialysis: Secondary | ICD-10-CM

## 2021-05-05 DIAGNOSIS — H5461 Unqualified visual loss, right eye, normal vision left eye: Secondary | ICD-10-CM | POA: Diagnosis present

## 2021-05-05 DIAGNOSIS — Z66 Do not resuscitate: Secondary | ICD-10-CM | POA: Diagnosis present

## 2021-05-05 DIAGNOSIS — Z794 Long term (current) use of insulin: Secondary | ICD-10-CM

## 2021-05-05 DIAGNOSIS — R778 Other specified abnormalities of plasma proteins: Secondary | ICD-10-CM | POA: Diagnosis present

## 2021-05-05 DIAGNOSIS — G8194 Hemiplegia, unspecified affecting left nondominant side: Secondary | ICD-10-CM | POA: Diagnosis present

## 2021-05-05 DIAGNOSIS — E1122 Type 2 diabetes mellitus with diabetic chronic kidney disease: Secondary | ICD-10-CM | POA: Diagnosis present

## 2021-05-05 DIAGNOSIS — Z885 Allergy status to narcotic agent status: Secondary | ICD-10-CM

## 2021-05-05 DIAGNOSIS — R531 Weakness: Secondary | ICD-10-CM

## 2021-05-05 DIAGNOSIS — K921 Melena: Secondary | ICD-10-CM | POA: Diagnosis present

## 2021-05-05 DIAGNOSIS — Z6823 Body mass index (BMI) 23.0-23.9, adult: Secondary | ICD-10-CM

## 2021-05-05 DIAGNOSIS — L89312 Pressure ulcer of right buttock, stage 2: Secondary | ICD-10-CM | POA: Diagnosis present

## 2021-05-05 DIAGNOSIS — I4891 Unspecified atrial fibrillation: Secondary | ICD-10-CM | POA: Diagnosis present

## 2021-05-05 DIAGNOSIS — R079 Chest pain, unspecified: Secondary | ICD-10-CM | POA: Diagnosis present

## 2021-05-05 DIAGNOSIS — I6340 Cerebral infarction due to embolism of unspecified cerebral artery: Secondary | ICD-10-CM | POA: Diagnosis present

## 2021-05-05 DIAGNOSIS — L89322 Pressure ulcer of left buttock, stage 2: Secondary | ICD-10-CM | POA: Diagnosis present

## 2021-05-05 DIAGNOSIS — D631 Anemia in chronic kidney disease: Secondary | ICD-10-CM | POA: Diagnosis present

## 2021-05-05 DIAGNOSIS — N400 Enlarged prostate without lower urinary tract symptoms: Secondary | ICD-10-CM | POA: Diagnosis present

## 2021-05-05 DIAGNOSIS — L89152 Pressure ulcer of sacral region, stage 2: Secondary | ICD-10-CM | POA: Diagnosis present

## 2021-05-05 DIAGNOSIS — R195 Other fecal abnormalities: Secondary | ICD-10-CM

## 2021-05-05 DIAGNOSIS — I351 Nonrheumatic aortic (valve) insufficiency: Secondary | ICD-10-CM | POA: Diagnosis present

## 2021-05-05 DIAGNOSIS — D696 Thrombocytopenia, unspecified: Secondary | ICD-10-CM | POA: Diagnosis present

## 2021-05-05 DIAGNOSIS — Z886 Allergy status to analgesic agent status: Secondary | ICD-10-CM

## 2021-05-05 DIAGNOSIS — N2581 Secondary hyperparathyroidism of renal origin: Secondary | ICD-10-CM | POA: Diagnosis present

## 2021-05-05 DIAGNOSIS — R7881 Bacteremia: Secondary | ICD-10-CM | POA: Diagnosis present

## 2021-05-05 DIAGNOSIS — R7989 Other specified abnormal findings of blood chemistry: Secondary | ICD-10-CM | POA: Diagnosis present

## 2021-05-05 DIAGNOSIS — R262 Difficulty in walking, not elsewhere classified: Secondary | ICD-10-CM | POA: Diagnosis not present

## 2021-05-05 DIAGNOSIS — Z79899 Other long term (current) drug therapy: Secondary | ICD-10-CM

## 2021-05-05 DIAGNOSIS — Z8616 Personal history of COVID-19: Secondary | ICD-10-CM

## 2021-05-05 DIAGNOSIS — E785 Hyperlipidemia, unspecified: Secondary | ICD-10-CM | POA: Diagnosis present

## 2021-05-05 DIAGNOSIS — Z7189 Other specified counseling: Secondary | ICD-10-CM

## 2021-05-05 DIAGNOSIS — Z515 Encounter for palliative care: Secondary | ICD-10-CM

## 2021-05-05 DIAGNOSIS — I639 Cerebral infarction, unspecified: Secondary | ICD-10-CM | POA: Diagnosis present

## 2021-05-05 DIAGNOSIS — Z7901 Long term (current) use of anticoagulants: Secondary | ICD-10-CM

## 2021-05-05 DIAGNOSIS — E1129 Type 2 diabetes mellitus with other diabetic kidney complication: Secondary | ICD-10-CM | POA: Diagnosis present

## 2021-05-05 DIAGNOSIS — Z8249 Family history of ischemic heart disease and other diseases of the circulatory system: Secondary | ICD-10-CM

## 2021-05-05 DIAGNOSIS — R627 Adult failure to thrive: Secondary | ICD-10-CM | POA: Diagnosis present

## 2021-05-05 DIAGNOSIS — Z85828 Personal history of other malignant neoplasm of skin: Secondary | ICD-10-CM

## 2021-05-05 DIAGNOSIS — L8990 Pressure ulcer of unspecified site, unspecified stage: Secondary | ICD-10-CM | POA: Insufficient documentation

## 2021-05-05 DIAGNOSIS — R29701 NIHSS score 1: Secondary | ICD-10-CM | POA: Diagnosis present

## 2021-05-05 DIAGNOSIS — Z87891 Personal history of nicotine dependence: Secondary | ICD-10-CM

## 2021-05-05 LAB — COMPREHENSIVE METABOLIC PANEL
ALT: 7 U/L (ref 0–44)
AST: 26 U/L (ref 15–41)
Albumin: 2 g/dL — ABNORMAL LOW (ref 3.5–5.0)
Alkaline Phosphatase: 47 U/L (ref 38–126)
Anion gap: 11 (ref 5–15)
BUN: 39 mg/dL — ABNORMAL HIGH (ref 8–23)
CO2: 23 mmol/L (ref 22–32)
Calcium: 7.9 mg/dL — ABNORMAL LOW (ref 8.9–10.3)
Chloride: 101 mmol/L (ref 98–111)
Creatinine, Ser: 5.07 mg/dL — ABNORMAL HIGH (ref 0.61–1.24)
GFR, Estimated: 11 mL/min — ABNORMAL LOW (ref >60)
Glucose, Bld: 160 mg/dL — ABNORMAL HIGH (ref 70–99)
Potassium: 3.3 mmol/L — ABNORMAL LOW (ref 3.5–5.1)
Sodium: 135 mmol/L (ref 135–145)
Total Bilirubin: 0.8 mg/dL (ref 0.3–1.2)
Total Protein: 5.7 g/dL — ABNORMAL LOW (ref 6.5–8.1)

## 2021-05-05 LAB — CBC WITH DIFFERENTIAL/PLATELET
Abs Immature Granulocytes: 0.17 10*3/uL — ABNORMAL HIGH (ref 0.00–0.07)
Basophils Absolute: 0 10*3/uL (ref 0.0–0.1)
Basophils Relative: 0 %
Eosinophils Absolute: 0 10*3/uL (ref 0.0–0.5)
Eosinophils Relative: 0 %
HCT: 30.5 % — ABNORMAL LOW (ref 39.0–52.0)
Hemoglobin: 9.3 g/dL — ABNORMAL LOW (ref 13.0–17.0)
Immature Granulocytes: 2 %
Lymphocytes Relative: 7 %
Lymphs Abs: 0.6 10*3/uL — ABNORMAL LOW (ref 0.7–4.0)
MCH: 28.5 pg (ref 26.0–34.0)
MCHC: 30.5 g/dL (ref 30.0–36.0)
MCV: 93.6 fL (ref 80.0–100.0)
Monocytes Absolute: 0.4 10*3/uL (ref 0.1–1.0)
Monocytes Relative: 5 %
Neutro Abs: 6.4 10*3/uL (ref 1.7–7.7)
Neutrophils Relative %: 86 %
Platelets: 101 10*3/uL — ABNORMAL LOW (ref 150–400)
RBC: 3.26 MIL/uL — ABNORMAL LOW (ref 4.22–5.81)
RDW: 16.7 % — ABNORMAL HIGH (ref 11.5–15.5)
WBC: 7.6 10*3/uL (ref 4.0–10.5)
nRBC: 0 % (ref 0.0–0.2)

## 2021-05-05 LAB — TYPE AND SCREEN
ABO/RH(D): AB POS
Antibody Screen: NEGATIVE

## 2021-05-05 LAB — CBC
HCT: 28 % — ABNORMAL LOW (ref 39.0–52.0)
Hemoglobin: 8.3 g/dL — ABNORMAL LOW (ref 13.0–17.0)
MCH: 28.1 pg (ref 26.0–34.0)
MCHC: 29.6 g/dL — ABNORMAL LOW (ref 30.0–36.0)
MCV: 94.9 fL (ref 80.0–100.0)
Platelets: 89 10*3/uL — ABNORMAL LOW (ref 150–400)
RBC: 2.95 MIL/uL — ABNORMAL LOW (ref 4.22–5.81)
RDW: 16.7 % — ABNORMAL HIGH (ref 11.5–15.5)
WBC: 5.8 10*3/uL (ref 4.0–10.5)
nRBC: 0 % (ref 0.0–0.2)

## 2021-05-05 LAB — TROPONIN I (HIGH SENSITIVITY)
Troponin I (High Sensitivity): 721 ng/L (ref <18)
Troponin I (High Sensitivity): 698 ng/L (ref <18)
Troponin I (High Sensitivity): 700 ng/L (ref <18)

## 2021-05-05 LAB — CBG MONITORING, ED
Glucose-Capillary: 128 mg/dL — ABNORMAL HIGH (ref 70–99)
Glucose-Capillary: 140 mg/dL — ABNORMAL HIGH (ref 70–99)
Glucose-Capillary: 128 mg/dL — ABNORMAL HIGH (ref 70–99)

## 2021-05-05 LAB — RESP PANEL BY RT-PCR (FLU A&B, COVID) ARPGX2
Influenza A by PCR: NEGATIVE
Influenza B by PCR: NEGATIVE
SARS Coronavirus 2 by RT PCR: NEGATIVE

## 2021-05-05 LAB — PROCALCITONIN: Procalcitonin: 0.8 ng/mL

## 2021-05-05 IMAGING — MR MR HEAD W/O CM
11 series · 48 of 48 positions shown · non-contrast
Comparison: Same-day noncontrast CT head

CLINICAL DATA: Left arm weakness

EXAM:
MRI HEAD WITHOUT CONTRAST
MRA HEAD WITHOUT CONTRAST
TECHNIQUE: Multiplanar, multi-echo pulse sequences of the brain and surrounding
structures were acquired without intravenous contrast. Angiographic
images of the Circle of Willis were acquired using MRA technique
without intravenous contrast.

[Series 5: ax dwi_tracew · axial · 3.0mm · 0.71mm/px · z∈[-84,+80]mm · 5 of 56 slices shown]
[im 1/56]
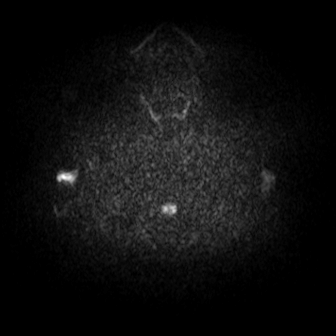
[im 14/56]
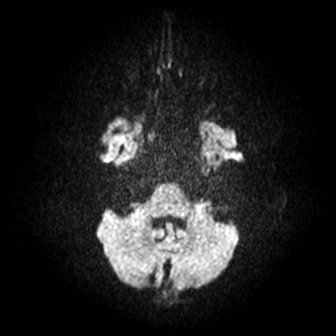
[im 28/56]
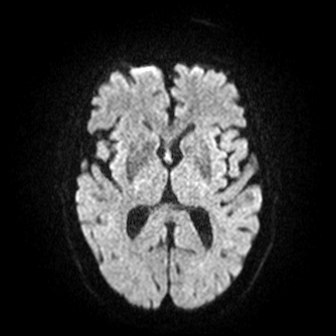
[im 42/56]
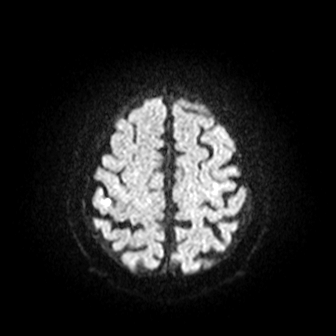
[im 56/56]
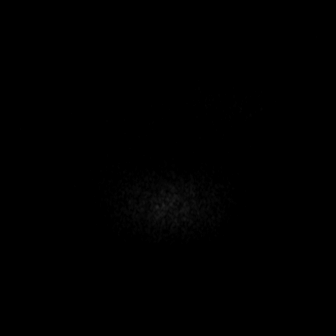

[Series 6: ax dwi_adc · axial · 3.0mm · 0.71mm/px · z∈[-84,+80]mm · 5 of 56 slices shown]
[im 1/56]
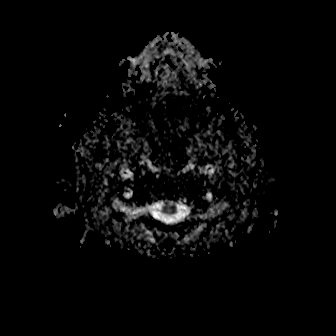
[im 14/56]
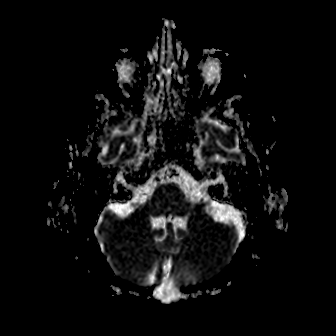
[im 28/56]
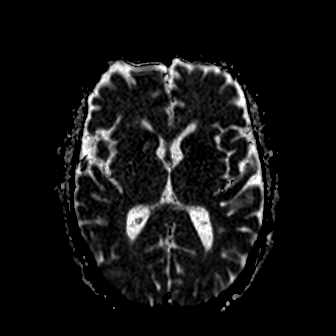
[im 42/56]
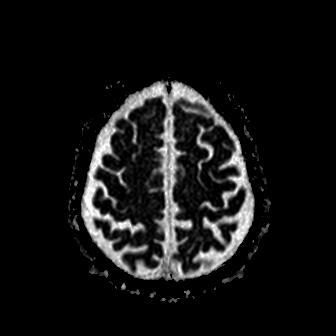
[im 56/56]
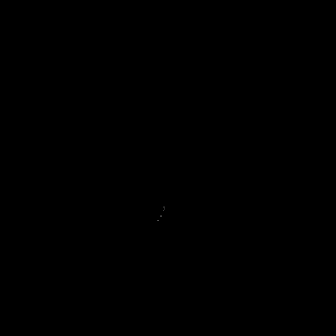

[Series 7: cor dwi_tracew · coronal · 5.0mm · 0.68mm/px · 3 of 40 slices shown]
[im 1/40]
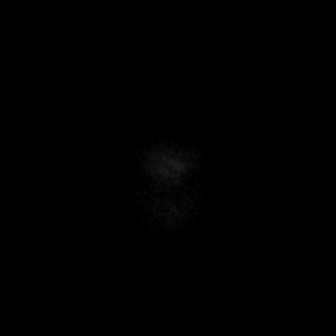
[im 20/40]
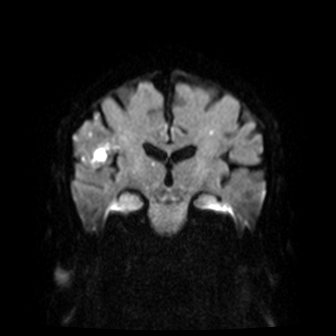
[im 40/40]
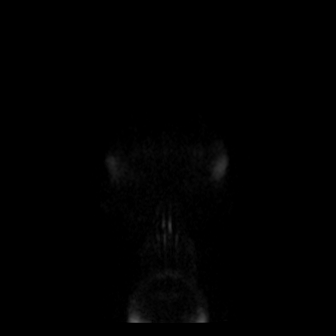

[Series 8: cor dwi_adc · coronal · 5.0mm · 0.68mm/px · 3 of 40 slices shown]
[im 1/40]
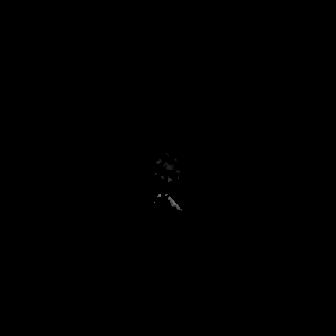
[im 20/40]
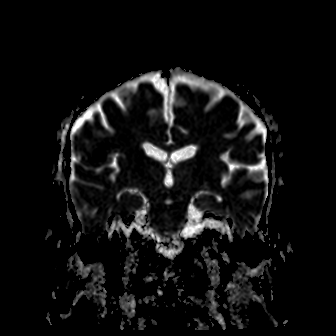
[im 40/40]
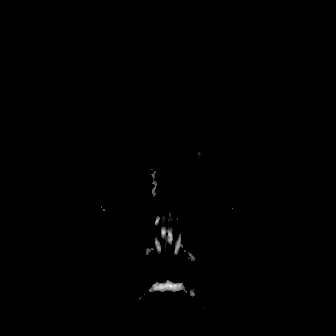

[Series 9: T1 · sagittal · 5.0mm · 0.47mm/px · 2 of 23 slices shown (1 of 2)]
[im 1/23]
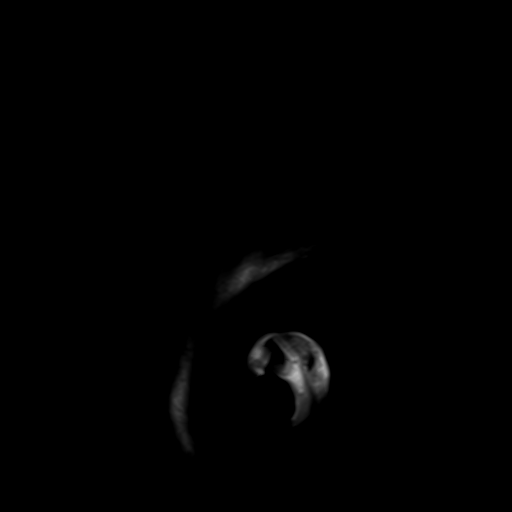
[im 23/23]
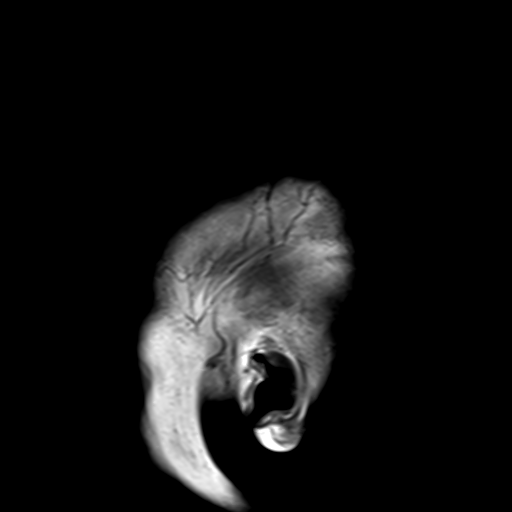

[Series 10: T2 · axial · 5.0mm · 0.86mm/px · z∈[-71,+73]mm · 2 of 25 slices shown (1 of 2)]
[im 1/25]
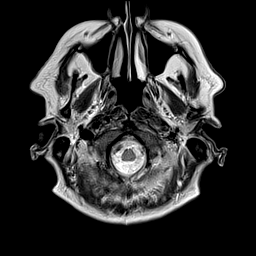
[im 25/25]
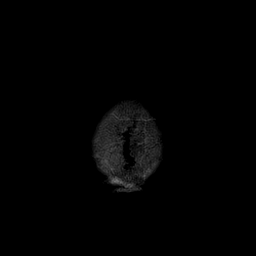

[Series 12: pha_images · axial · 3.0mm · 0.90mm/px · z∈[-75,+77]mm · 4 of 51 slices shown]
[im 1/51]
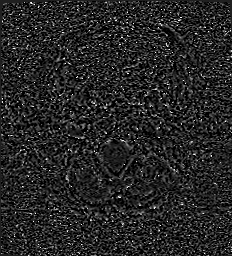
[im 17/51]
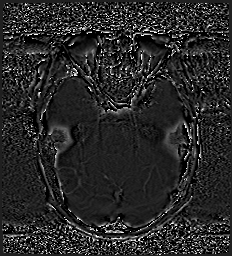
[im 34/51]
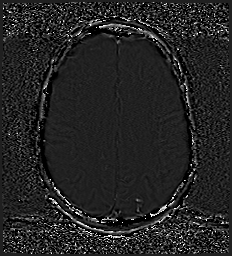
[im 51/51]
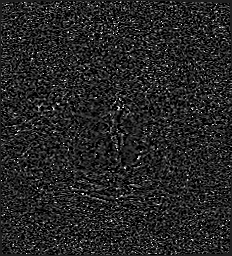

[Series 13: swi_images · axial · 3.0mm · 0.90mm/px · z∈[-75,+77]mm · 4 of 52 slices shown]
[im 1/52]
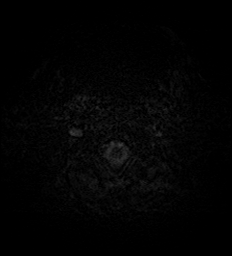
[im 18/52]
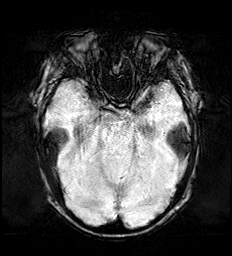
[im 35/52]
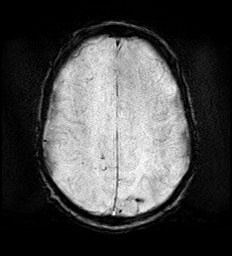
[im 52/52]
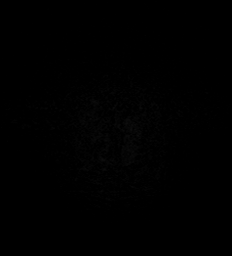

[Series 15: FLAIR · axial · 3.0mm · 0.69mm/px · z∈[-80,+82]mm · 4 of 55 slices shown]
[im 1/55]
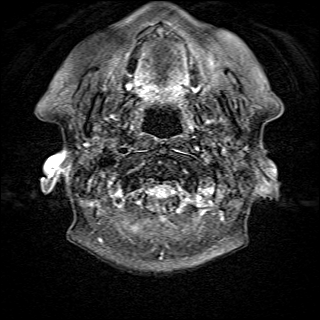
[im 19/55]
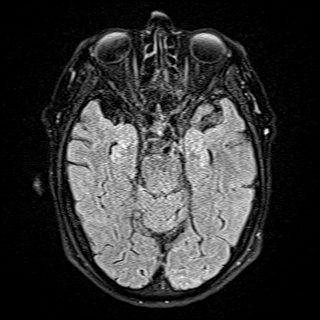
[im 37/55]
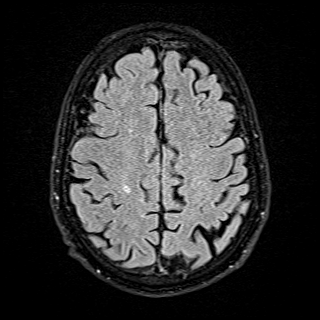
[im 55/55]
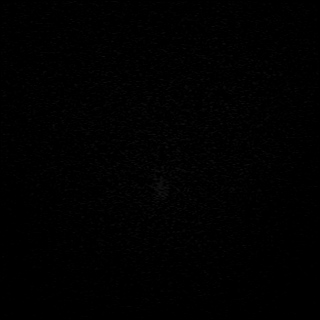

[Series 16: T1 · axial · 1.0mm · 0.98mm/px · z∈[-85,+89]mm · 14 of 176 slices shown (2 of 2)]
[im 1/176]
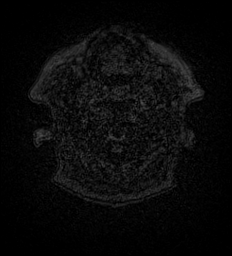
[im 14/176]
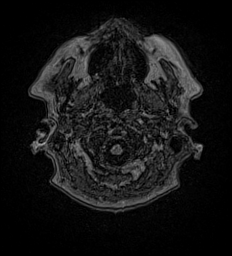
[im 27/176]
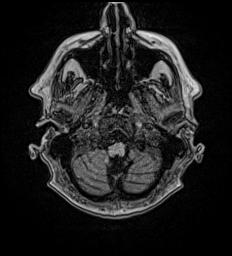
[im 41/176]
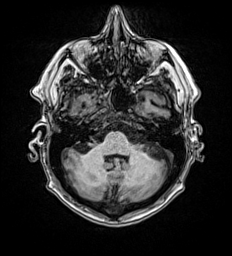
[im 54/176]
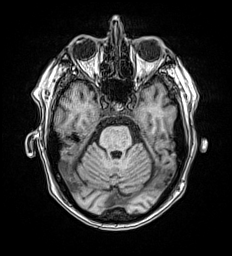
[im 68/176]
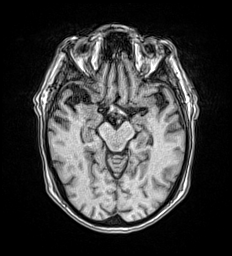
[im 81/176]
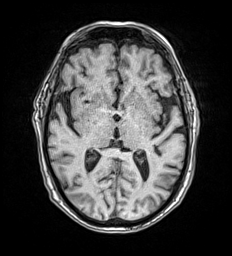
[im 95/176]
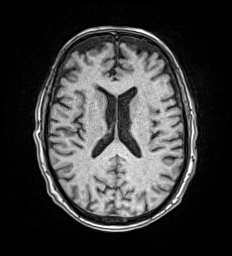
[im 108/176]
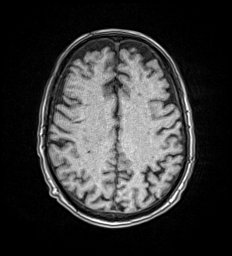
[im 122/176]
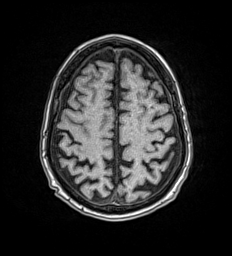
[im 135/176]
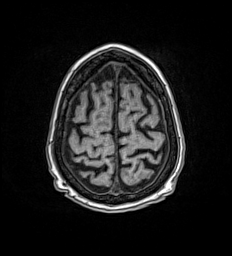
[im 149/176]
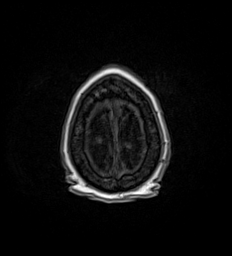
[im 162/176]
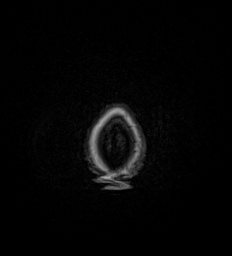
[im 176/176]
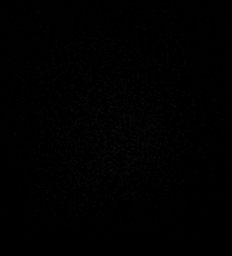

[Series 17: T2 · coronal · 5.0mm · 0.86mm/px · 2 of 30 slices shown (2 of 2)]
[im 1/30]
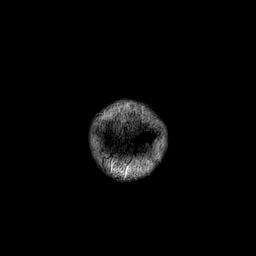
[im 30/30]
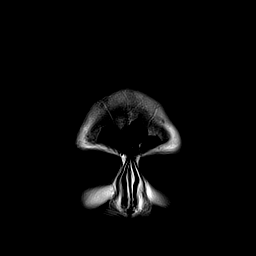

[48 of 48 positions shown; findings below may reference images not displayed]

FINDINGS: MRI HEAD FINDINGS

Brain: There are numerous foci of acute to early subacute infarct in
the bilateral cerebral and cerebellar hemispheres, most notably in
the right posteroinferior frontal lobe. There is no associated
hemorrhage or mass effect.

Background parenchymal volume is normal. The ventricles are normal
in size. Scattered additional foci of FLAIR signal abnormality in
the subcortical and periventricular white matter likely reflects
sequela of mild chronic white matter microangiopathy.

There are scattered curvilinear foci of SWI signal dropout overlying
both cerebral hemispheres consistent with old blood products. There
is no evidence of acute intracranial hemorrhage or extra-axial fluid
collection.

There is no mass lesion.  There is no midline shift.

Vascular: Normal flow voids.

Skull and upper cervical spine: Normal marrow signal.

Sinuses/Orbits: The paranasal sinuses are clear. Bilateral lens
implants are in place. The globes and orbits are otherwise
unremarkable.

Other: None.

MRA HEAD FINDINGS

Anterior circulation: The intracranial ICAs are patent.

The bilateral MCAs are patent.

The bilateral ACAs are patent. The anterior communicating artery is
not definitely seen.

There is no aneurysm or AVM.

Posterior circulation: The bilateral V4 segments are patent. PICA is
identified bilaterally. The basilar artery is patent.

The bilateral PCAs are patent. The posterior communicating artery is
identified on the right

There is no aneurysm or AVM.

Anatomic variants: None.
IMPRESSION: 1. Scattered acute to early subacute infarcts in the bilateral
cerebral and cerebellar hemispheres, most notably in the right
posteroinferior frontal lobe, likely embolic in etiology.
2. Patent intracranial vasculature.
3. Scattered areas of remote blood products overlying the bilateral
cerebral hemispheres.

## 2021-05-05 IMAGING — DX DG CHEST 1V PORT
1 series · 1 of 1 positions shown · non-contrast
Comparison: [DATE]

CLINICAL DATA: Chest pain

EXAM:
PORTABLE CHEST 1 VIEW

[chest ap]
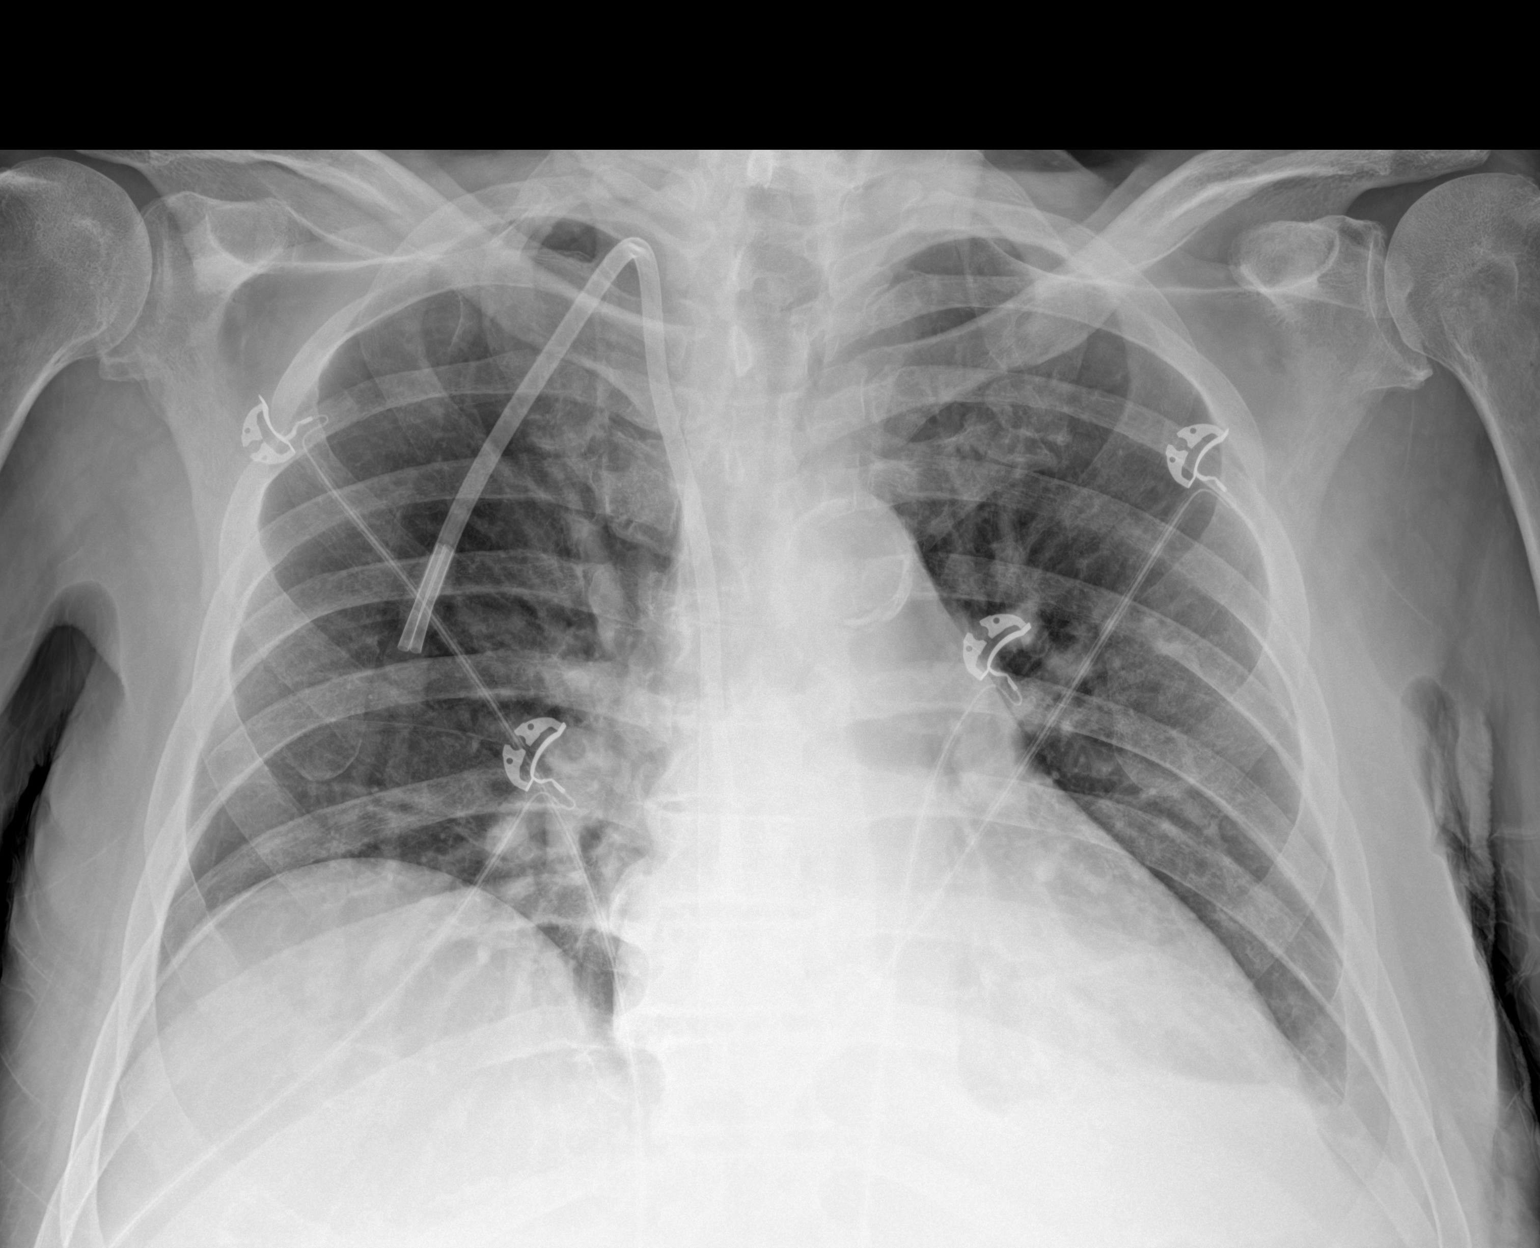

[1 of 1 positions shown; findings below may reference images not displayed]

FINDINGS: Indistinct retrocardiac density with blunting at the lateral left
costophrenic sulcus. Chronic cardiomegaly. Dialysis catheter with
tips at the right atrium. No edema pneumothorax.
IMPRESSION: Retrocardiac infiltrate or atelectasis with small pleural effusion.

## 2021-05-05 IMAGING — CT CT HEAD W/O CM
4 series · 16 of 47 positions shown, 18 images · non-contrast
Comparison: Head CT [DATE].

CLINICAL DATA: 80-year-old male with history of generalized
weakness. Decreased appetite. Evaluate for potential stroke.



[Series 2: head wo · axial · 0.48mm/px · z∈[-153,-33]mm · 7 of 33 slices shown, 9 images]
[im 5/33  brain]
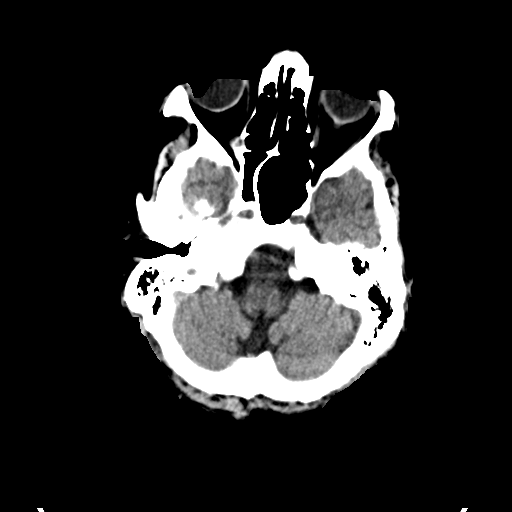
[im 5/33  bone]
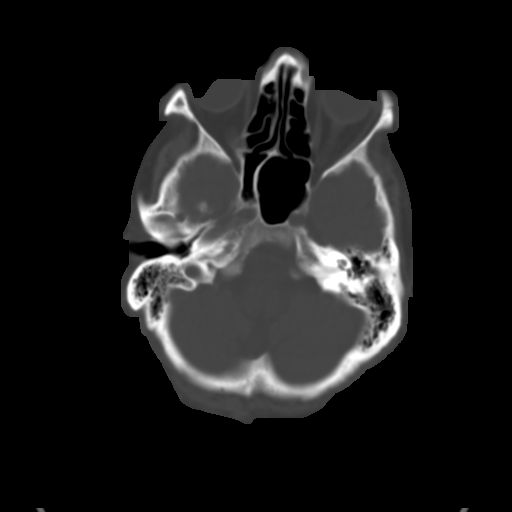
[im 9/33  brain]
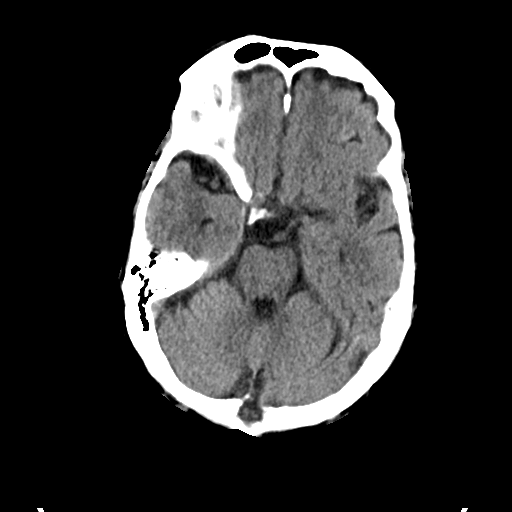
[im 13/33  brain]
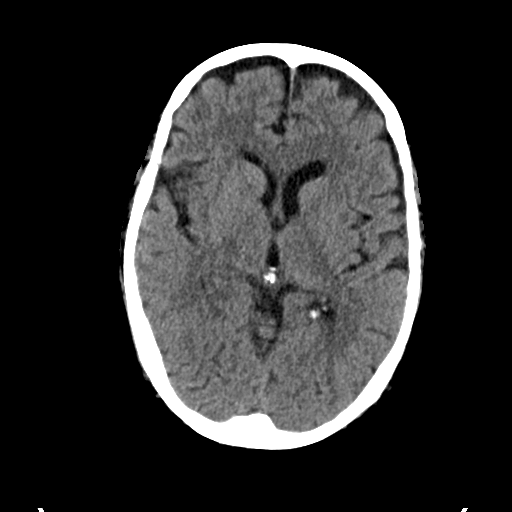
[im 17/33  brain]
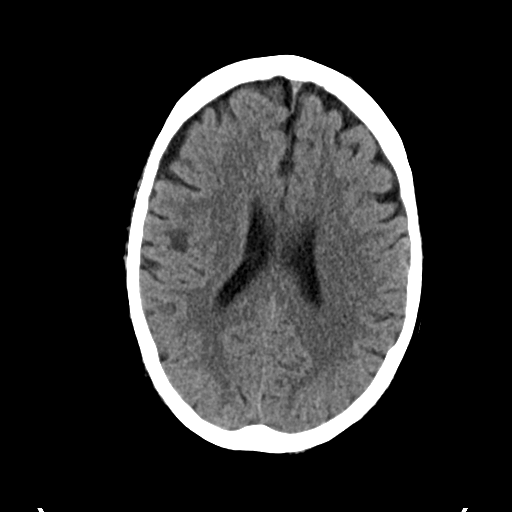
[im 21/33  brain]
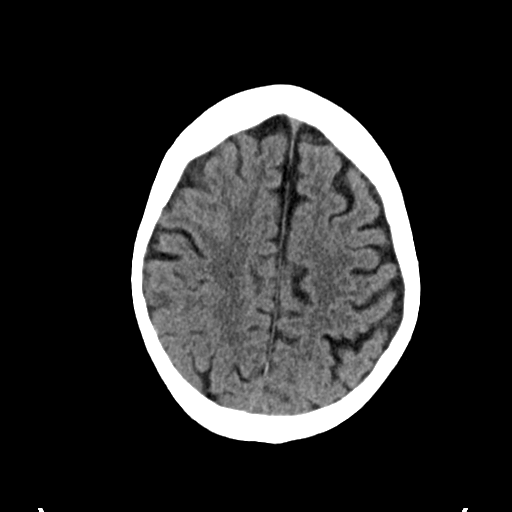
[im 21/33  bone]
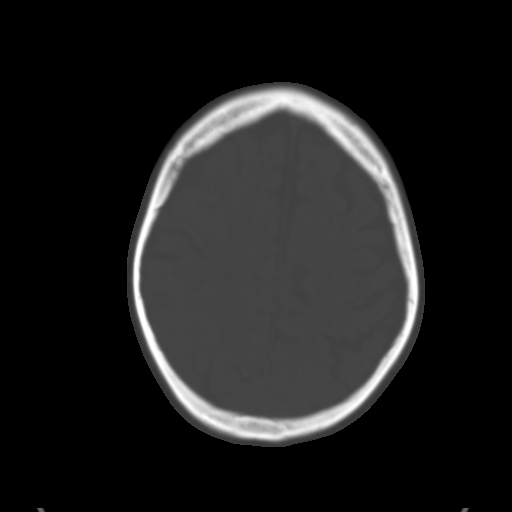
[im 25/33  brain]
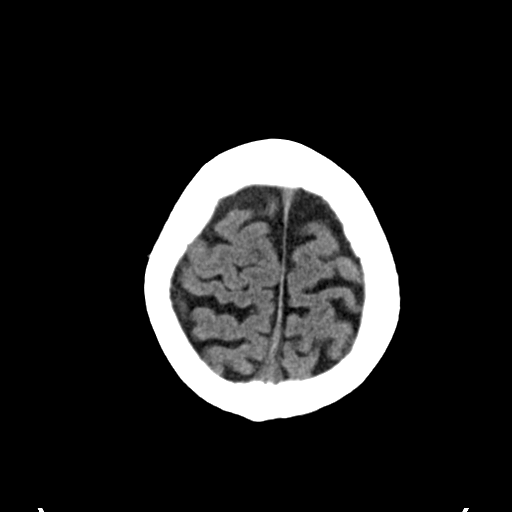
[im 29/33  brain]
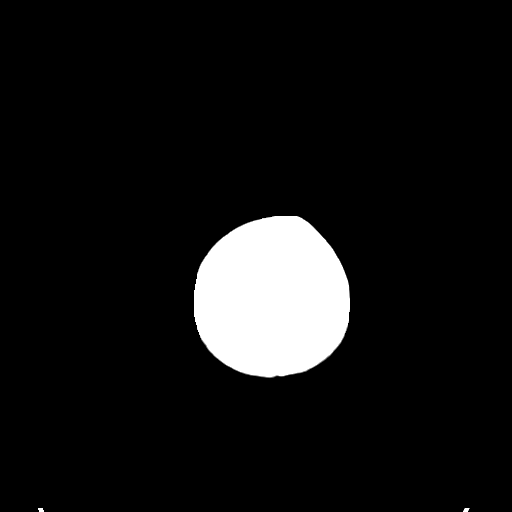

[Series 3: head bone · axial · 0.48mm/px · z∈[-157,-125]mm · 3 of 82 slices shown]
[im 9/82  bone]
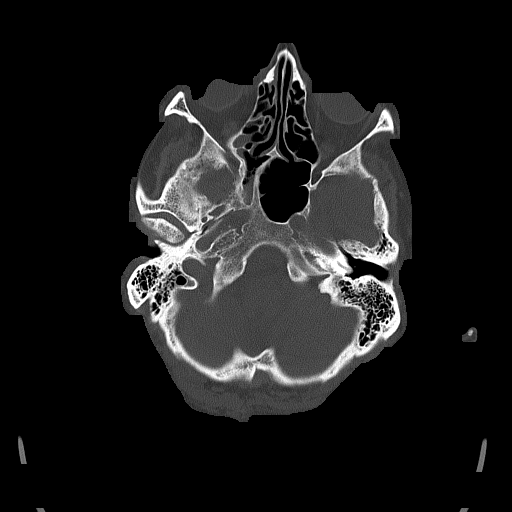
[im 17/82  bone]
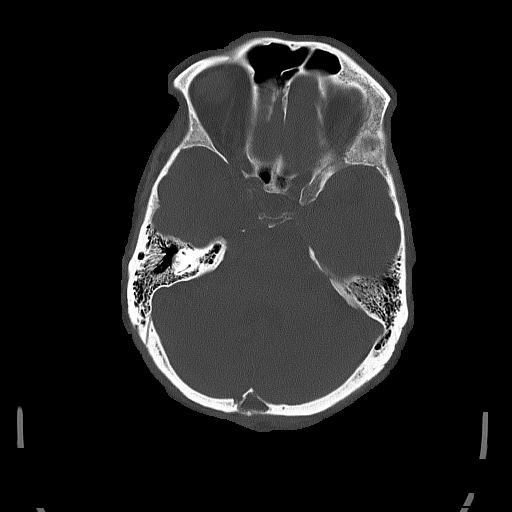
[im 25/82  bone]
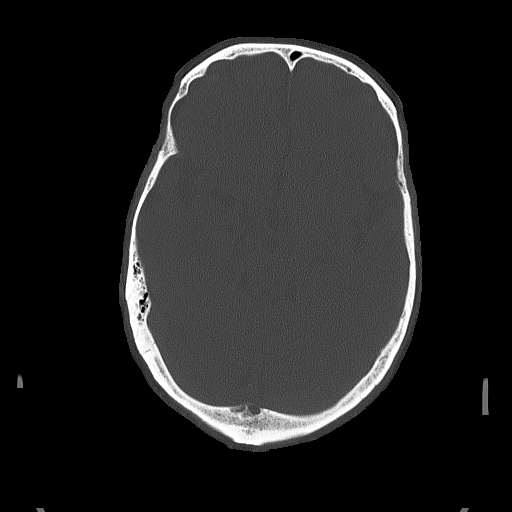

[Series 4: coronal soft tissue · coronal · 0.34mm/px · 3 of 68 slices shown]
[im 23/68  brain]
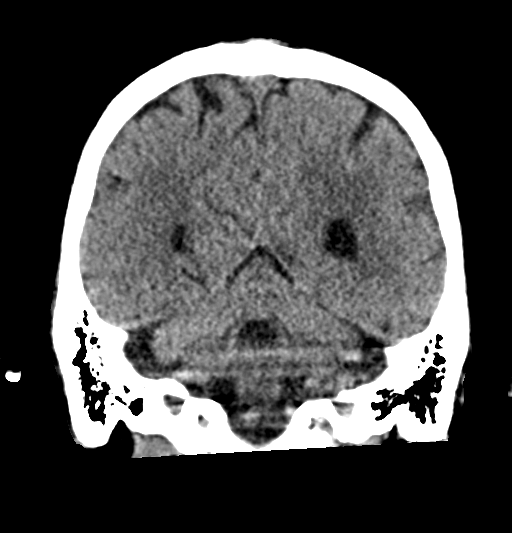
[im 30/68  brain]
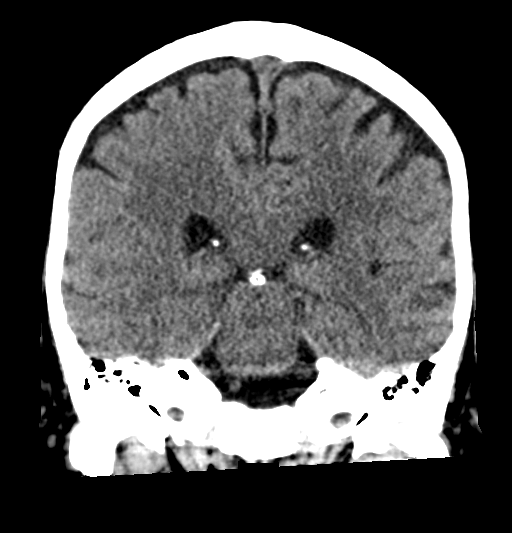
[im 38/68  brain]
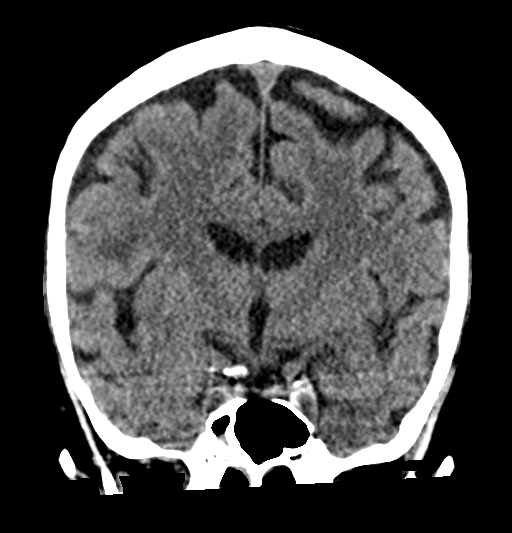

[Series 5: sagittal soft tissue · sagittal · 0.35mm/px · 3 of 53 slices shown]
[im 18/53  brain]
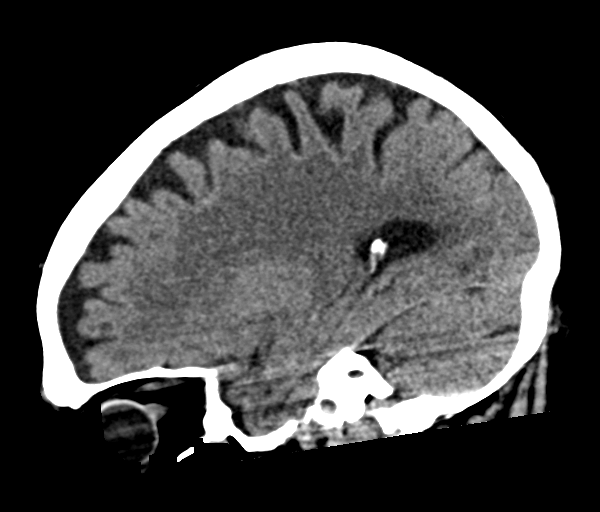
[im 27/53  brain]
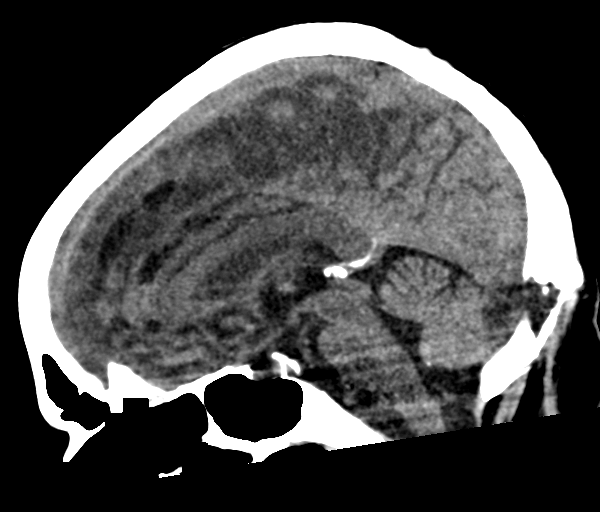
[im 35/53  brain]
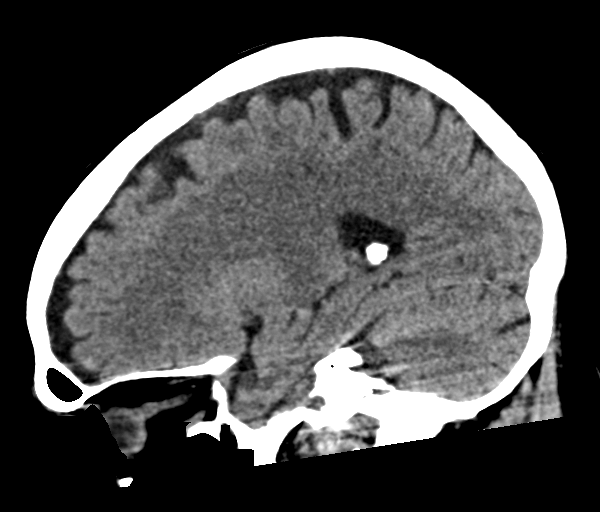

[16 of 47 positions shown; findings below may reference images not displayed]

FINDINGS: Brain: Mild cerebral atrophy. In the posterior right frontal region
(axial image 17 of series 2) there is a new area of relatively
well-defined low-attenuation which does not affect the overlying
cortical gray matter. No evidence of acute infarction, hemorrhage,
hydrocephalus, extra-axial collection or mass lesion/mass effect.

Vascular: No hyperdense vessel or unexpected calcification.

Skull: Normal. Negative for fracture or focal lesion.

Sinuses/Orbits: No acute finding.

Other: None.
IMPRESSION: 1. New age-indeterminate but nonacute infarct in the right posterior
frontal region, which could represent a late subacute infarct.
2. No other acute findings.

## 2021-05-05 IMAGING — CR DG CHEST 2V
1 series · 2 of 2 positions shown · non-contrast
Comparison: [DATE] at [DATE] a.m., and older studies.

CLINICAL DATA: Pt to ED via [facility] from home for generalized
weakness. Pt has had decreased appetite over the last few days. Pt
has declined since then. Pt is in A. Fib around 114-120, pt has hx/o
same. Pt is having bilateral rib pain and pt did have some numbness
in his left arm yesterday. Pt has very pale and has swelling in
bilateral lower legs

EXAM:
CHEST - 2 VIEW

[Series 1: dg chest 2 view · 0.14mm/px · 2 of 2 slices shown]
[im 1/2]
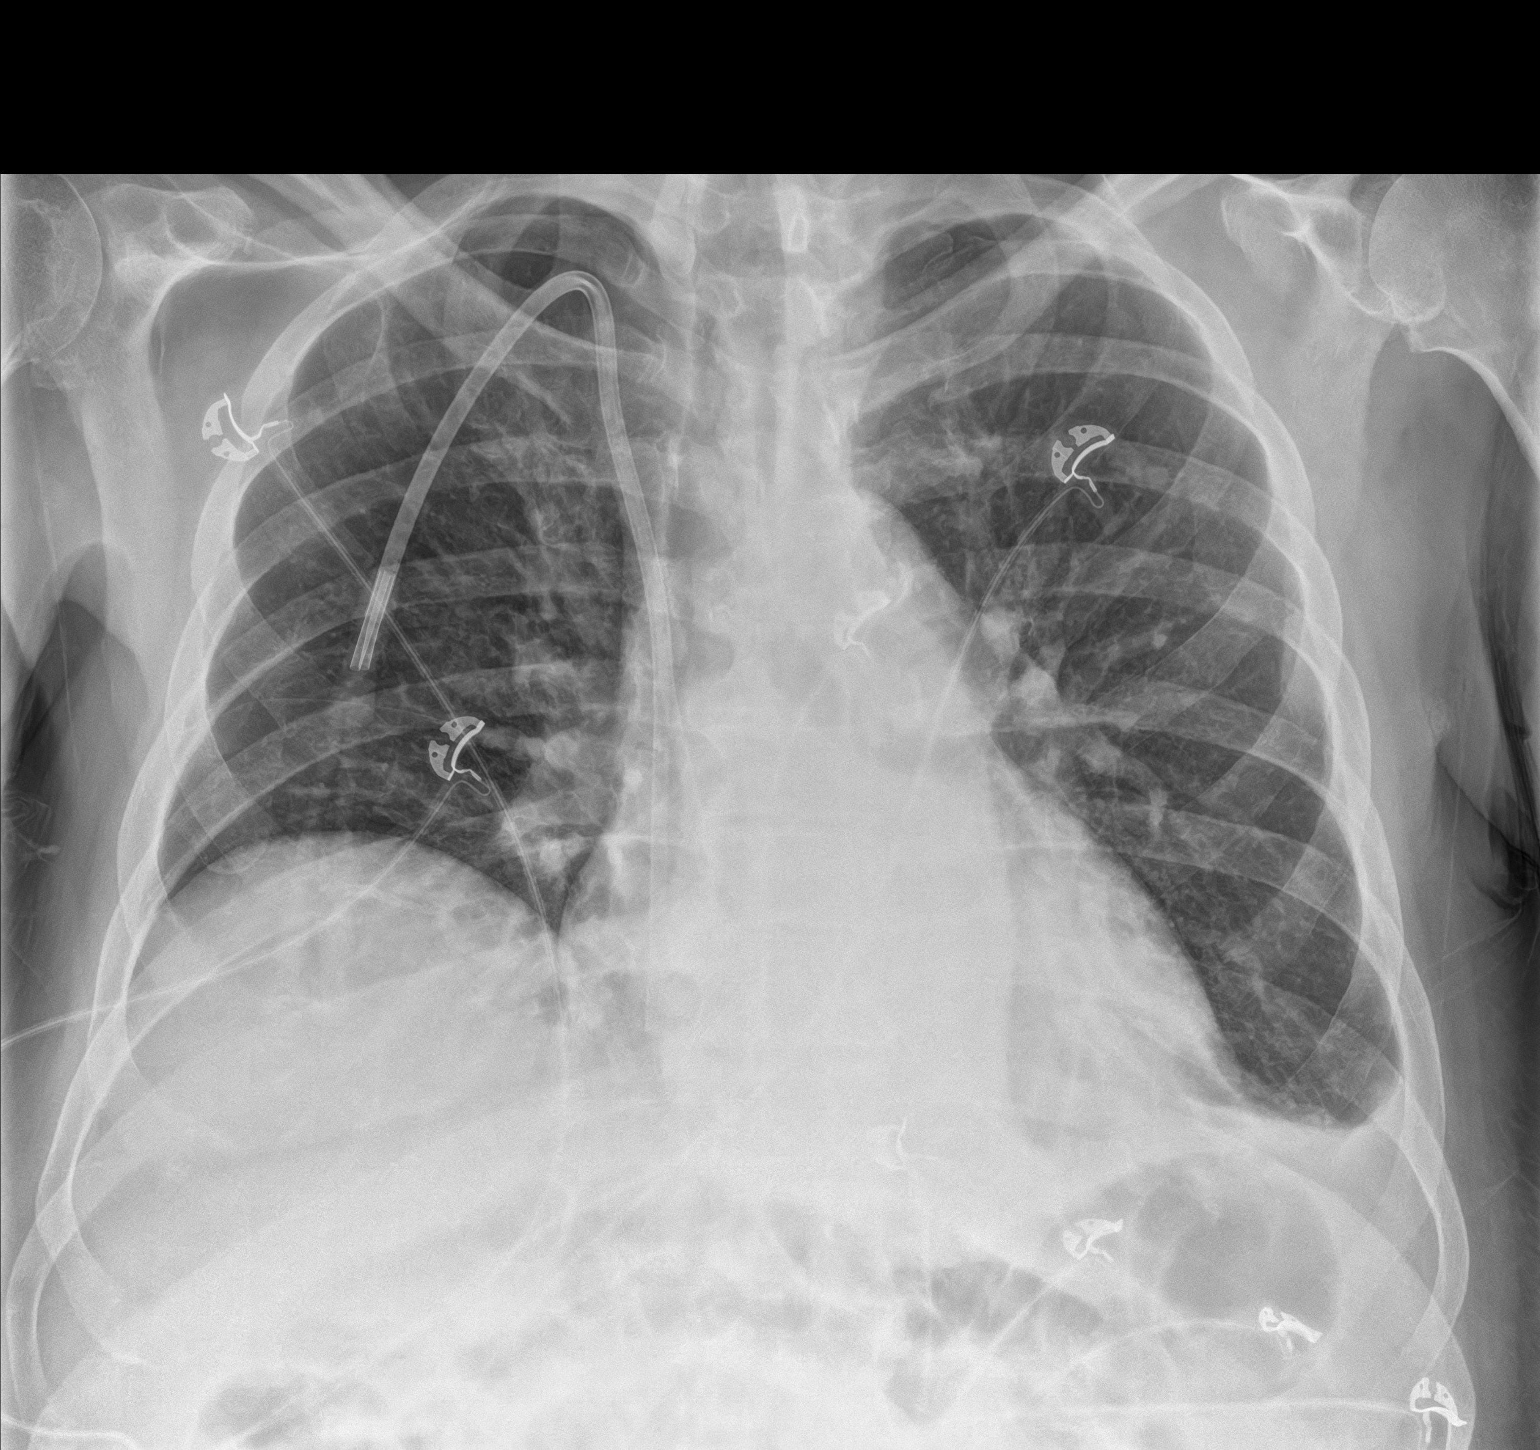
[im 2/2]
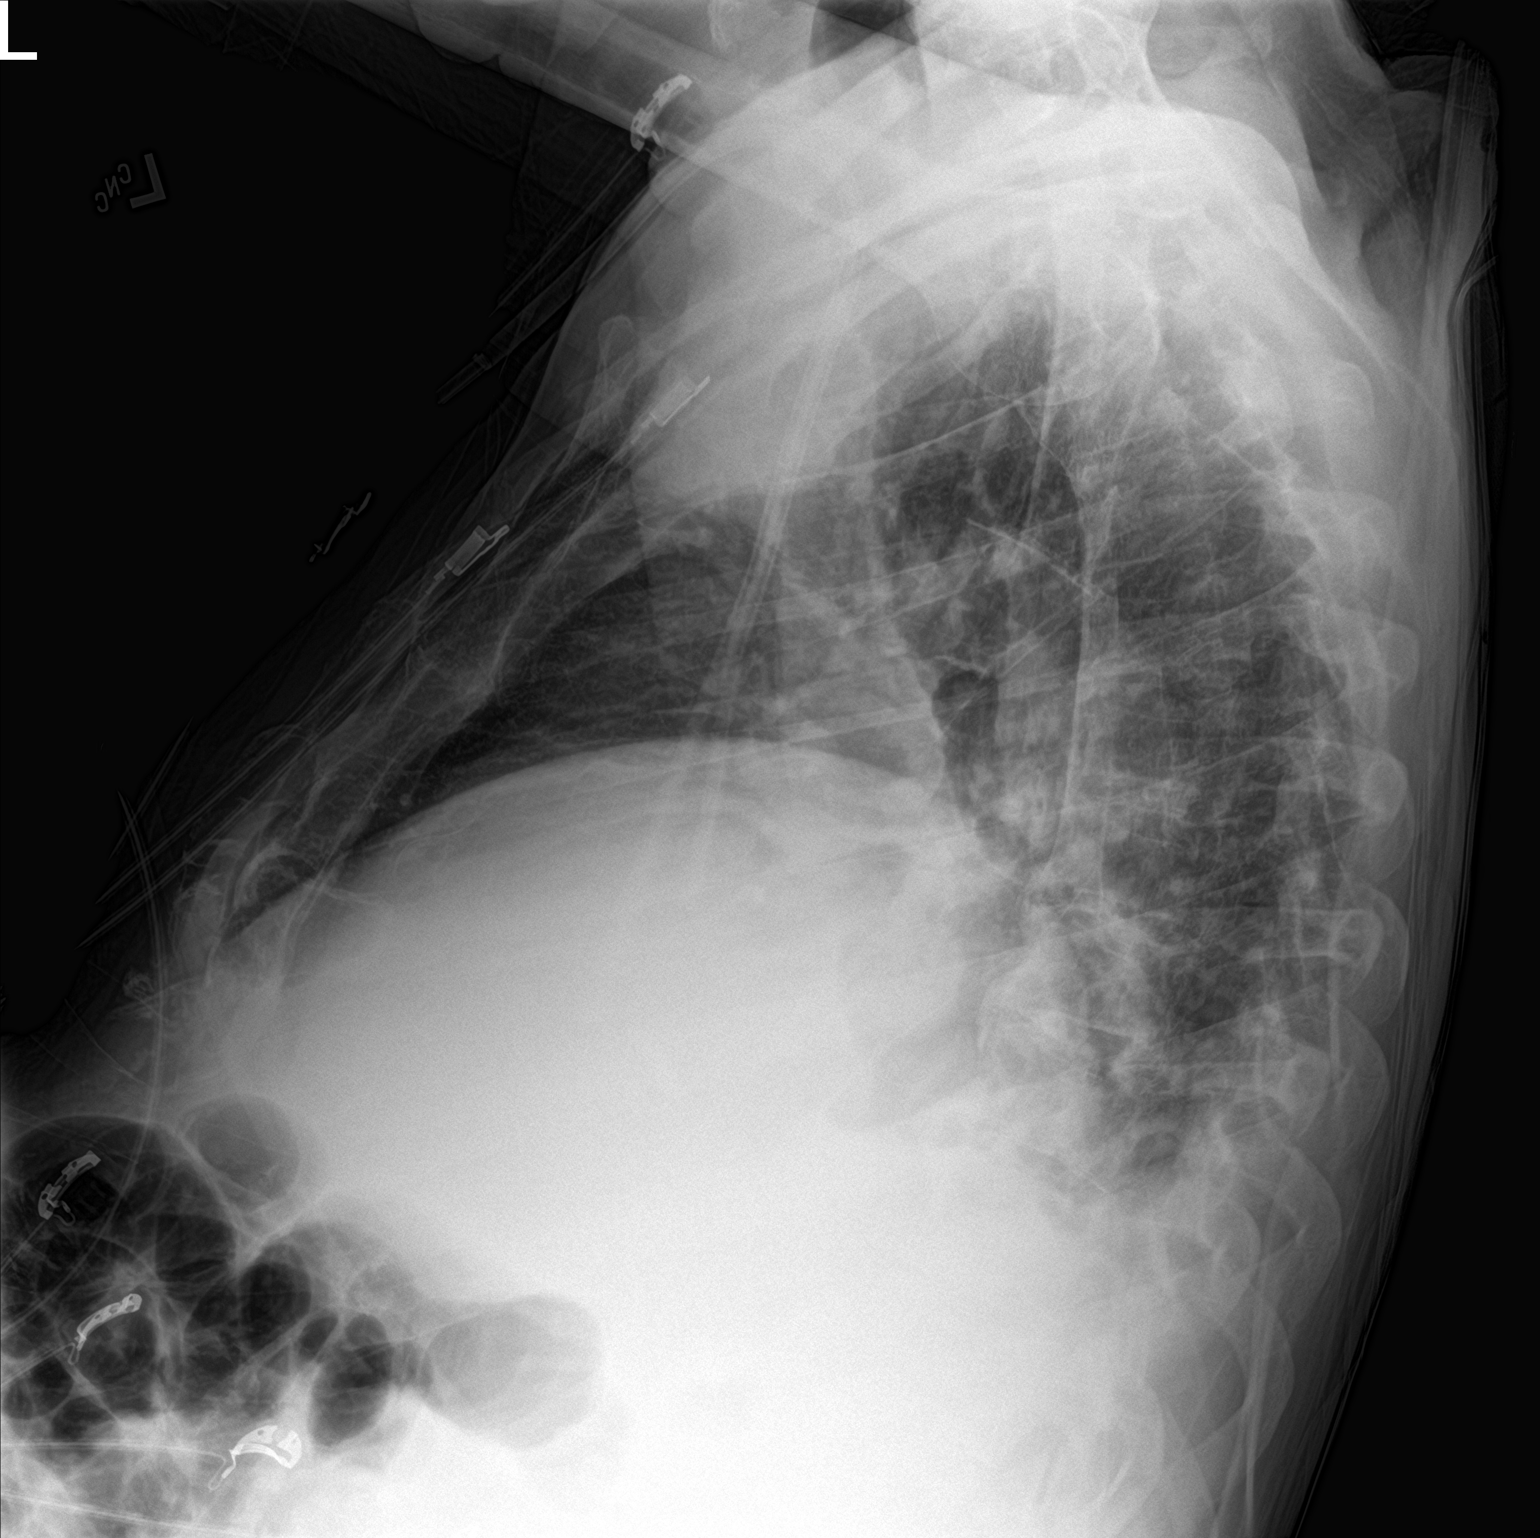

[2 of 2 positions shown; findings below may reference images not displayed]

FINDINGS: Left retrocardiac opacity is unchanged suspected to be atelectasis,
less likely pneumonia, with a small effusion. No new lung opacities.

Stable cardiac silhouette. Stable right internal jugular tunneled
central venous catheter.

No pneumothorax.
IMPRESSION: 1. No change from the exam obtained approximately 1 hour earlier.

## 2021-05-05 IMAGING — MR MR MRA HEAD W/O CM
1 series · 26 of 48 positions shown · non-contrast
Comparison: Same-day noncontrast CT head

CLINICAL DATA: Left arm weakness

EXAM:
MRI HEAD WITHOUT CONTRAST
MRA HEAD WITHOUT CONTRAST
TECHNIQUE: Multiplanar, multi-echo pulse sequences of the brain and surrounding
structures were acquired without intravenous contrast. Angiographic
images of the Circle of Willis were acquired using MRA technique
without intravenous contrast.

[Series 5: TOF · axial · 0.5mm · 0.48mm/px · z∈[-72,+2]mm · 26 of 168 slices shown]
[im 1/168]
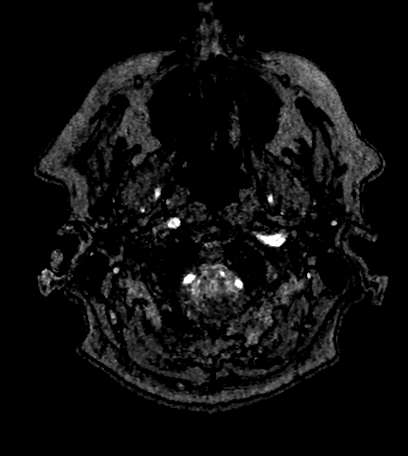
[im 4/168]
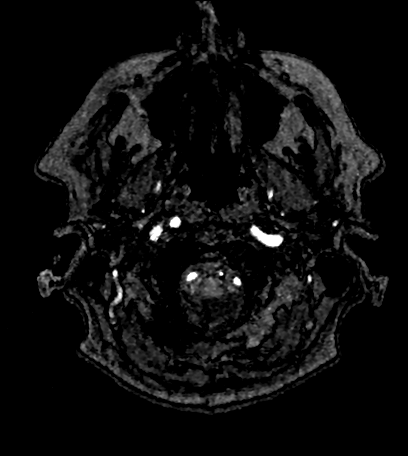
[im 8/168]
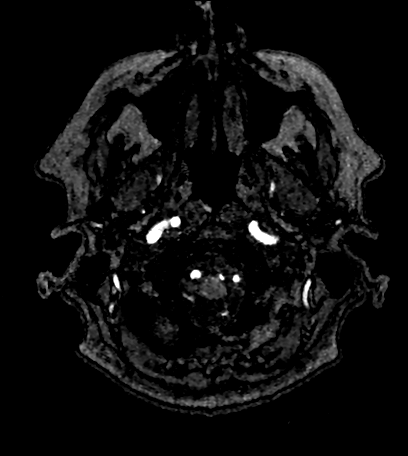
[im 11/168]
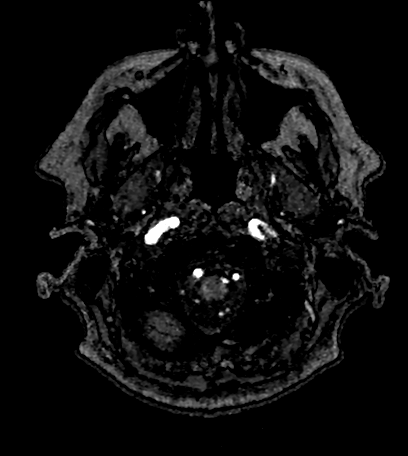
[im 15/168]
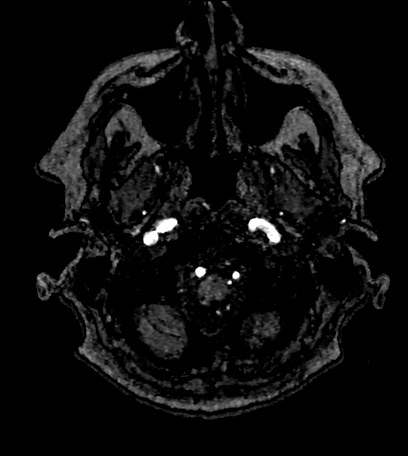
[im 18/168]
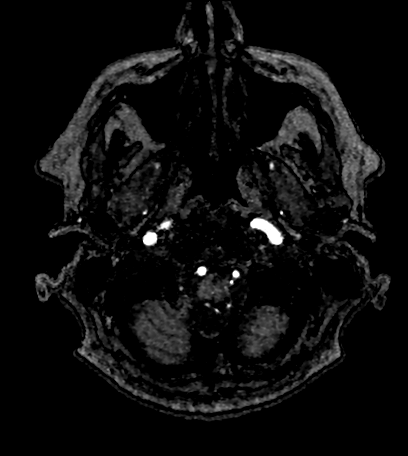
[im 22/168]
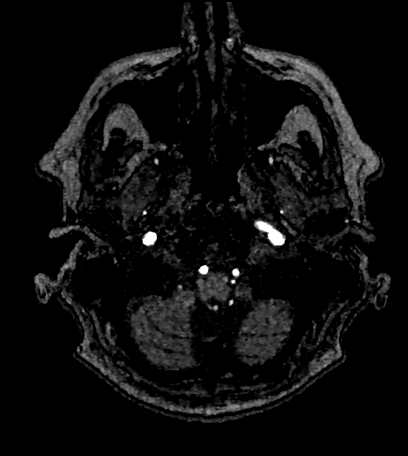
[im 25/168]
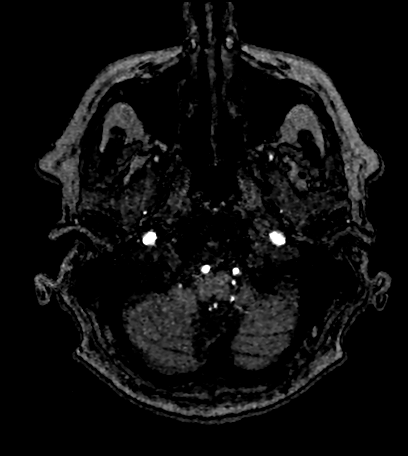
[im 29/168]
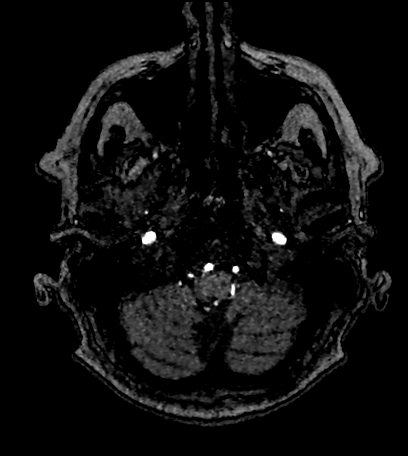
[im 32/168]
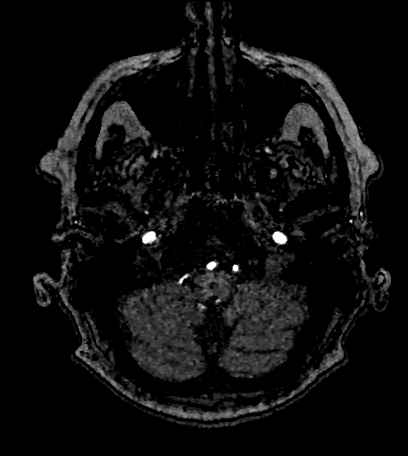
[im 36/168]
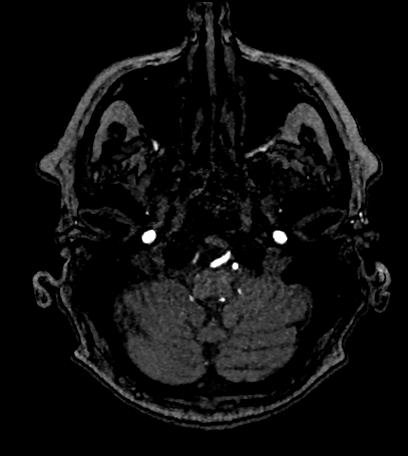
[im 40/168]
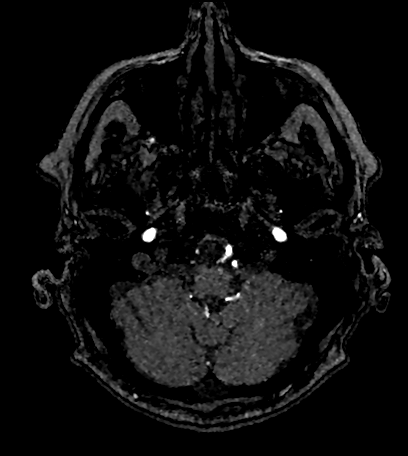
[im 43/168]
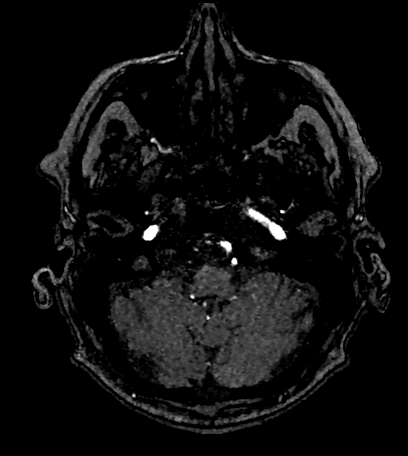
[im 47/168]
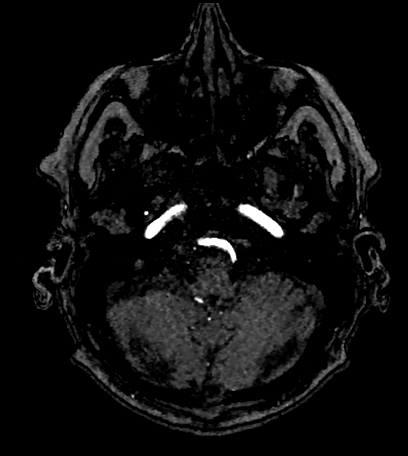
[im 50/168]
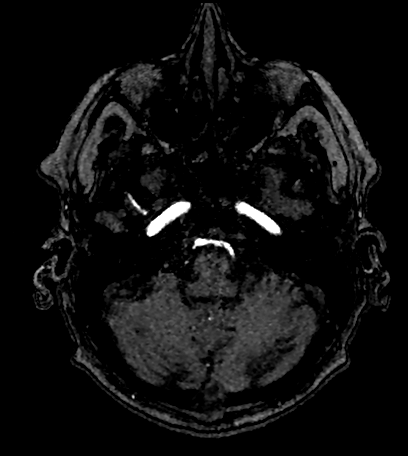
[im 54/168]
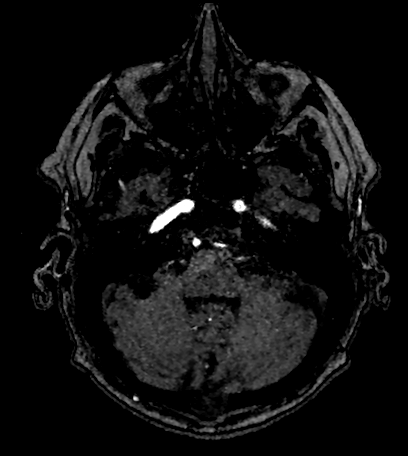
[im 57/168]
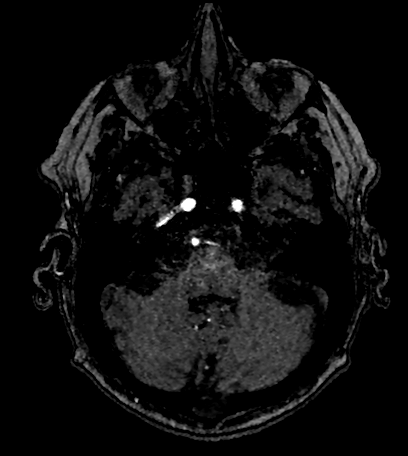
[im 61/168]
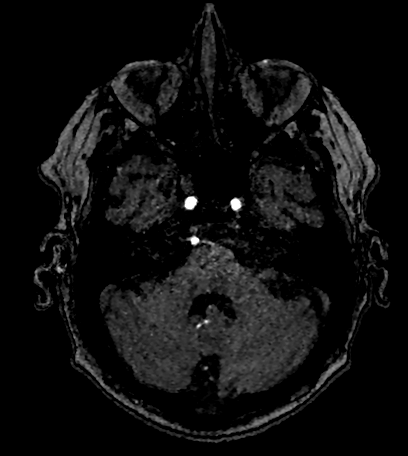
[im 64/168]
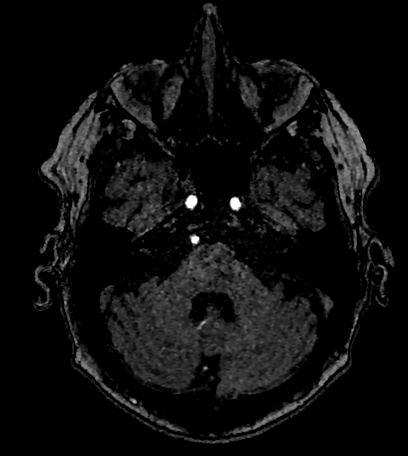
[im 75/168]
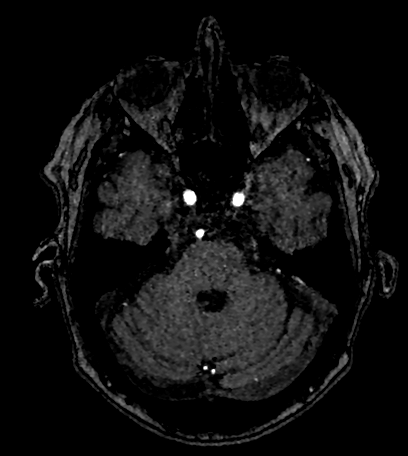
[im 86/168]
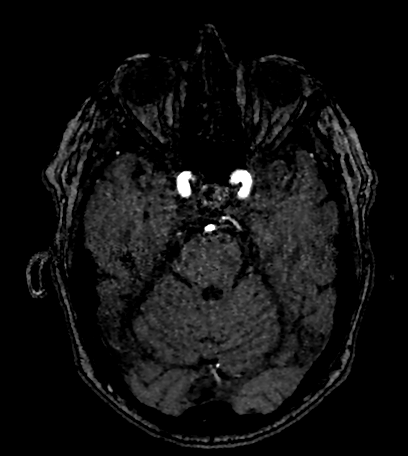
[im 96/168]
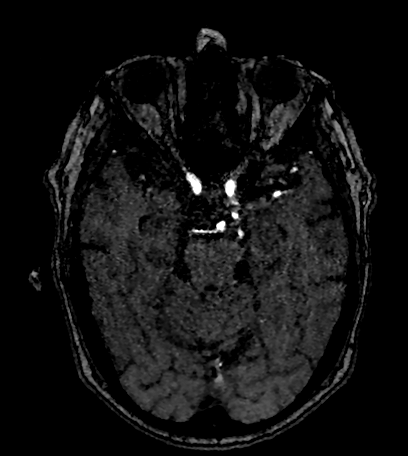
[im 118/168]
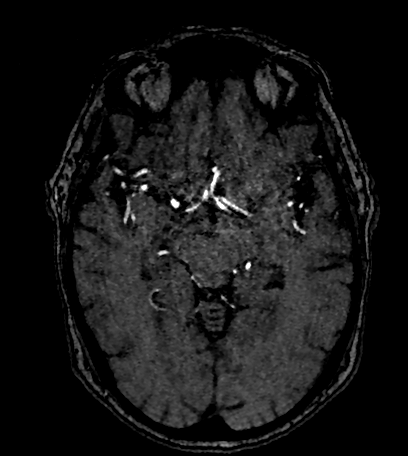
[im 139/168]
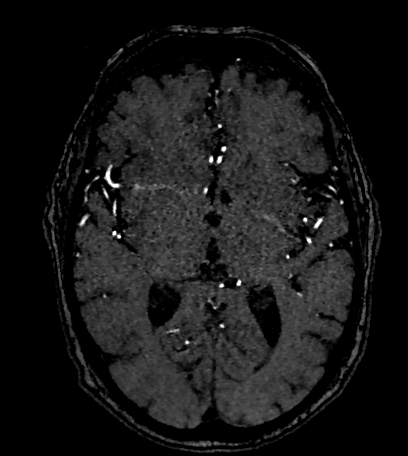
[im 143/168]
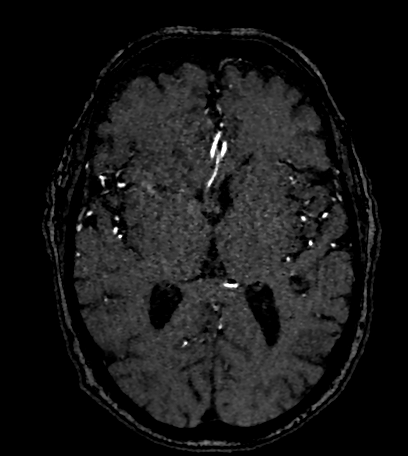
[im 160/168]
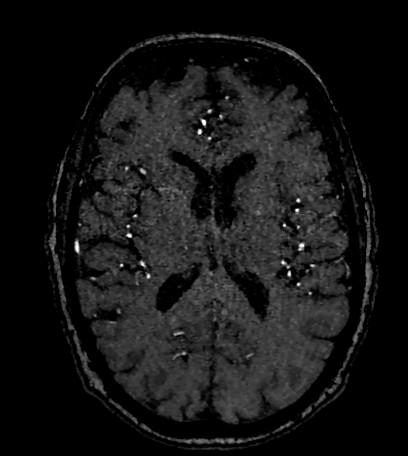

[26 of 48 positions shown; findings below may reference images not displayed]

FINDINGS: MRI HEAD FINDINGS

Brain: There are numerous foci of acute to early subacute infarct in
the bilateral cerebral and cerebellar hemispheres, most notably in
the right posteroinferior frontal lobe. There is no associated
hemorrhage or mass effect.

Background parenchymal volume is normal. The ventricles are normal
in size. Scattered additional foci of FLAIR signal abnormality in
the subcortical and periventricular white matter likely reflects
sequela of mild chronic white matter microangiopathy.

There are scattered curvilinear foci of SWI signal dropout overlying
both cerebral hemispheres consistent with old blood products. There
is no evidence of acute intracranial hemorrhage or extra-axial fluid
collection.

There is no mass lesion.  There is no midline shift.

Vascular: Normal flow voids.

Skull and upper cervical spine: Normal marrow signal.

Sinuses/Orbits: The paranasal sinuses are clear. Bilateral lens
implants are in place. The globes and orbits are otherwise
unremarkable.

Other: None.

MRA HEAD FINDINGS

Anterior circulation: The intracranial ICAs are patent.

The bilateral MCAs are patent.

The bilateral ACAs are patent. The anterior communicating artery is
not definitely seen.

There is no aneurysm or AVM.

Posterior circulation: The bilateral V4 segments are patent. PICA is
identified bilaterally. The basilar artery is patent.

The bilateral PCAs are patent. The posterior communicating artery is
identified on the right

There is no aneurysm or AVM.

Anatomic variants: None.
IMPRESSION: 1. Scattered acute to early subacute infarcts in the bilateral
cerebral and cerebellar hemispheres, most notably in the right
posteroinferior frontal lobe, likely embolic in etiology.
2. Patent intracranial vasculature.
3. Scattered areas of remote blood products overlying the bilateral
cerebral hemispheres.

## 2021-05-05 MED ORDER — SODIUM CHLORIDE 0.9 % IV BOLUS
250.0000 mL | Freq: Once | INTRAVENOUS | Status: AC
  Administered 2021-05-05: 250 mL via INTRAVENOUS

## 2021-05-05 MED ORDER — CARVEDILOL 6.25 MG PO TABS
3.1250 mg | ORAL_TABLET | Freq: Two times a day (BID) | ORAL | Status: DC
  Administered 2021-05-05 – 2021-05-06 (×3): 3.125 mg via ORAL
  Filled 2021-05-05 (×3): qty 1

## 2021-05-05 MED ORDER — ACETAMINOPHEN 160 MG/5ML PO SOLN
650.0000 mg | ORAL | Status: DC | PRN
  Filled 2021-05-05: qty 20.3

## 2021-05-05 MED ORDER — INSULIN ASPART 100 UNIT/ML IJ SOLN: 0.0000 [IU] | Freq: Three times a day (TID) | INTRAMUSCULAR | Status: DC

## 2021-05-05 MED ORDER — DILTIAZEM HCL ER COATED BEADS 120 MG PO CP24
360.0000 mg | ORAL_CAPSULE | Freq: Every evening | ORAL | Status: DC
  Filled 2021-05-05 (×2): qty 3
  Filled 2021-05-05: qty 2

## 2021-05-05 MED ORDER — CHLORHEXIDINE GLUCONATE CLOTH 2 % EX PADS
6.0000 | MEDICATED_PAD | Freq: Every day | CUTANEOUS | Status: DC
  Filled 2021-05-05 (×2): qty 6

## 2021-05-05 MED ORDER — CEFTRIAXONE SODIUM 1 G IJ SOLR
1.0000 g | Freq: Once | INTRAMUSCULAR | Status: AC
  Administered 2021-05-05: 1 g via INTRAVENOUS
  Filled 2021-05-05: qty 10

## 2021-05-05 MED ORDER — PANTOPRAZOLE SODIUM 40 MG IV SOLR: 40.0000 mg | Freq: Two times a day (BID) | INTRAVENOUS | Status: DC

## 2021-05-05 MED ORDER — PANTOPRAZOLE INFUSION (NEW) - SIMPLE MED
8.0000 mg/h | INTRAVENOUS | Status: DC
  Filled 2021-05-05: qty 100

## 2021-05-05 MED ORDER — PANTOPRAZOLE 80MG IVPB - SIMPLE MED
80.0000 mg | Freq: Once | INTRAVENOUS | Status: DC
  Filled 2021-05-05: qty 100

## 2021-05-05 MED ORDER — STROKE: EARLY STAGES OF RECOVERY BOOK: Freq: Once | Status: DC

## 2021-05-05 MED ORDER — ONDANSETRON HCL 4 MG/2ML IJ SOLN: 4.0000 mg | Freq: Three times a day (TID) | INTRAMUSCULAR | Status: DC | PRN

## 2021-05-05 MED ORDER — INSULIN ASPART 100 UNIT/ML IJ SOLN: 0.0000 [IU] | Freq: Every day | INTRAMUSCULAR | Status: DC

## 2021-05-05 MED ORDER — FENOFIBRATE 160 MG PO TABS
160.0000 mg | ORAL_TABLET | Freq: Every day | ORAL | Status: DC
  Administered 2021-05-06: 160 mg via ORAL
  Filled 2021-05-05 (×3): qty 1

## 2021-05-05 MED ORDER — PANTOPRAZOLE SODIUM 40 MG IV SOLR
40.0000 mg | Freq: Two times a day (BID) | INTRAVENOUS | Status: DC
  Administered 2021-05-05 – 2021-05-06 (×3): 40 mg via INTRAVENOUS
  Filled 2021-05-05 (×3): qty 40

## 2021-05-05 MED ORDER — SODIUM CHLORIDE 0.9 % IV SOLN
500.0000 mg | Freq: Once | INTRAVENOUS | Status: AC
  Administered 2021-05-05: 500 mg via INTRAVENOUS
  Filled 2021-05-05: qty 5

## 2021-05-05 MED ORDER — METOPROLOL TARTRATE 5 MG/5ML IV SOLN
2.5000 mg | INTRAVENOUS | Status: DC | PRN
  Administered 2021-05-05 – 2021-05-06 (×2): 2.5 mg via INTRAVENOUS
  Filled 2021-05-05 (×2): qty 5

## 2021-05-05 MED ORDER — ACETAMINOPHEN 650 MG RE SUPP: 650.0000 mg | RECTAL | Status: DC | PRN

## 2021-05-05 MED ORDER — ALBUTEROL SULFATE (2.5 MG/3ML) 0.083% IN NEBU: 3.0000 mL | INHALATION_SOLUTION | RESPIRATORY_TRACT | Status: DC | PRN

## 2021-05-05 MED ORDER — FERROUS SULFATE 325 (65 FE) MG PO TABS: 325.0000 mg | ORAL_TABLET | Freq: Two times a day (BID) | ORAL | Status: DC

## 2021-05-05 MED ORDER — ATORVASTATIN CALCIUM 20 MG PO TABS
40.0000 mg | ORAL_TABLET | Freq: Every day | ORAL | Status: DC
  Administered 2021-05-05 – 2021-05-06 (×2): 40 mg via ORAL
  Filled 2021-05-05 (×2): qty 2

## 2021-05-05 MED ORDER — ACETAMINOPHEN 325 MG PO TABS
650.0000 mg | ORAL_TABLET | ORAL | Status: DC | PRN
  Administered 2021-05-06: 650 mg via ORAL
  Filled 2021-05-05: qty 2

## 2021-05-05 NOTE — Consult Note (Signed)
Cephas Darby, MD [address]  [place]  [place], [place] [address]  Main: [phone]  Fax: [phone] Pager: [phone]   Consultation  Referring Provider:     No ref. provider found Primary Care Physician:  Valera Castle, MD Primary Gastroenterologist:  Dr. Vicente Males         Reason for Consultation:     ?  Black stools  Date of Admission:  04/07/2021 Date of Consultation:  04/12/2021         HPI:   Gregory Dixon is a 81 y.o. male with history of ESRD on hemodialysis, diabetes, A. fib with RVR on Eliquis, hypertension who presented from home secondary to lack of appetite, generalized weakness, numbness in his left arm, active A. fib with RVR.  He had dialysis on Friday.  Patient underwent robotic assisted laparoscopic cholecystectomy on 04/24/2021 secondary to acute calculus cholecystitis, sepsis.  He also underwent hemorrhoidectomy in 02/2021.  I was called by Dr. Rip Harbour due to concern for black stool, possible GI bleed and if anticoagulation needs to be held.  Patient did not take Eliquis since yesterday. Apparently, patient has been on oral iron twice daily since November.  His hemoglobin on admission is 9.3, it was 9.4 on 04/24/2021.  No evidence of leukocytosis.  His baseline runs between 7 and 9.  Patient is found to have significantly elevated troponins 721, BUN and creatinine more or less at baseline.  Calcitonin levels normal.  Patient denies any abdominal pain, nausea or vomiting.  He reports that his stools are dark black.  He does not know what medications he takes, his wife gives him the medications for him  Patient recently underwent upper endoscopy and colonoscopy on 02/24/2021.  EGD was normal and colonoscopy revealed internal hemorrhoids and small polyps that were removed   NSAIDs: None  Antiplts/Anticoagulants/Anti thrombotics: Eliquis for history of A. fib with RVR  GI Procedures: EGD and colonoscopy 02/24/2021  - Small hiatal hernia. -  Normal stomach. - Normal examined duodenum. - No specimens collected.  - Two 3 to 4 mm polyps in the transverse colon, removed with a cold snare. Resected and retrieved. 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 - One 4 mm polyp in the sigmoid colon, removed with a cold snare. Resected and retrieved. - Non-bleeding internal hemorrhoids.   Past Medical History:  Diagnosis Date   Actinic keratosis    Anemia    Basal cell carcinoma 10/21/2017   left lateral neck, excised 02/09/2018   Basal cell carcinoma (BCC) 05/12/2019   right posterior ear, excised 05/31/2019   Blood transfusion without reported diagnosis    Chronic kidney disease    Diabetes mellitus without complication ([place])    Dysrhythmia    History of kidney stones    Hypertension     Past Surgical History:  Procedure Laterality Date   COLONOSCOPY N/A 02/24/2021   Procedure: COLONOSCOPY;  Surgeon: [name], MD;  Location: [facility] ENDOSCOPY;  Service: Endoscopy;  Laterality: N/A;   COLONOSCOPY WITH PROPOFOL N/A 04/29/2019   Procedure: COLONOSCOPY WITH PROPOFOL;  Surgeon: Jonathon Bellows, MD;  Location: [facility] ENDOSCOPY;  Service: Gastroenterology;  Laterality: N/A;   DIALYSIS/PERMA CATHETER INSERTION Bilateral 01/28/2021   Procedure: DIALYSIS/PERMA CATHETER INSERTION;  Surgeon: Algernon Huxley, MD;  Location: [facility];  Service: Cardiovascular;  Laterality: Bilateral;   ESOPHAGOGASTRODUODENOSCOPY N/A 02/24/2021   Procedure: ESOPHAGOGASTRODUODENOSCOPY (EGD);  Surgeon: [name], MD;  Location: [facility] ENDOSCOPY;  Service: Endoscopy;  Laterality: N/A;  ESOPHAGOGASTRODUODENOSCOPY (EGD) WITH PROPOFOL N/A 04/29/2019   Procedure: ESOPHAGOGASTRODUODENOSCOPY (EGD) WITH PROPOFOL;  Surgeon: Jonathon Bellows, MD;  Location: [facility] ENDOSCOPY;  Service: Gastroenterology;  Laterality: N/A;   HEMORRHOID SURGERY N/A 03/02/2021   Procedure: HEMORRHOIDECTOMY;  Surgeon: Jules Husbands, MD;  Location: [facility] ORS;  Service: General;  Laterality: N/A;    IR PERC CHOLECYSTOSTOMY  01/25/2021   IR PERC CHOLECYSTOSTOMY  03/11/2021   TONSILLECTOMY      Prior to Admission medications   Medication Sig Start Date End Date Taking? Authorizing Provider  apixaban (ELIQUIS) 2.5 MG TABS tablet Take 1 tablet (2.5 mg total) by mouth 2 (two) times daily. 02/26/21  Yes Elgergawy, Silver Huguenin, MD  carvedilol (COREG) 3.125 MG tablet Take 3.125 mg by mouth 2 (two) times daily. 03/19/21  Yes [provider]  diltiazem (CARDIZEM CD) 360 MG 24 hr capsule Take 1 capsule (360 mg total) by mouth daily. 03/06/21 06/04/21 Yes Enzo Bi, MD  epoetin alfa (EPOGEN) 10000 UNIT/ML injection Inject 1 mL (10,000 Units total) into the vein every Monday, Wednesday, and Friday with hemodialysis. 03/06/21  Yes Enzo Bi, MD  fenofibrate 160 MG tablet Take 160 mg by mouth at bedtime.   Yes [provider]  ferrous sulfate 325 (65 FE) MG tablet Take 325 mg by mouth 2 (two) times daily with a meal.   Yes [provider]  Vitamin D, Ergocalciferol, (DRISDOL) 1.25 MG (50000 UNIT) CAPS capsule Take 1 capsule (50,000 Units total) by mouth every 7 (seven) days. 02/07/21 05/08/21 Yes Val Riles, MD  BD POSIFLUSH 0.9 % SOLN injection FLUSH WITH 5ML TWICE DAILY Patient not taking: Reported on 04/14/2021 04/07/21   Ronny Bacon, MD  carvedilol (COREG) 6.25 MG tablet Take 1 tablet (6.25 mg total) by mouth 2 (two) times daily with a meal. Patient not taking: Reported on 04/10/2021 03/06/21 06/04/21  Enzo Bi, MD  HYDROcodone-acetaminophen (NORCO/VICODIN) 5-325 MG tablet Take 1 tablet by mouth every 6 (six) hours as needed for moderate pain. Patient not taking: Reported on 04/26/2021 04/24/21   Ronny Bacon, MD  insulin NPH-regular Human (70-30) 100 UNIT/ML injection Hold until followup with PCP, as I don't think you need insulin anymore. Patient not taking: Reported on 04/24/2021 03/06/21   Enzo Bi, MD    Current Facility-Administered Medications:     stroke:  mapping our early stages of recovery book, , Does not apply, Once, Ivor Costa, MD   acetaminophen (TYLENOL) tablet 650 mg, 650 mg, Oral, Q4H PRN **OR** acetaminophen (TYLENOL) 160 MG/5ML solution 650 mg, 650 mg, Per Tube, Q4H PRN **OR** acetaminophen (TYLENOL) suppository 650 mg, 650 mg, Rectal, Q4H PRN, Ivor Costa, MD   albuterol (PROVENTIL) (2.5 MG/3ML) 0.083% nebulizer solution 3 mL, 3 mL, Nebulization, Q4H PRN, Ivor Costa, MD   atorvastatin (LIPITOR) tablet 40 mg, 40 mg, Oral, Daily, Ivor Costa, MD, 40 mg at 04/27/2021 1204   carvedilol (COREG) tablet 3.125 mg, 3.125 mg, Oral, BID, Ivor Costa, MD, 3.125 mg at 04/22/2021 1126   diltiazem (CARDIZEM CD) 24 hr capsule 360 mg, 360 mg, Oral, QPM, Ivor Costa, MD   fenofibrate tablet 160 mg, 160 mg, Oral, QHS, Ivor Costa, MD   insulin aspart (novoLOG) injection 0-5 Units, 0-5 Units, Subcutaneous, QHS, Ivor Costa, MD   insulin aspart (novoLOG) injection 0-9 Units, 0-9 Units, Subcutaneous, TID WC, Ivor Costa, MD   metoprolol tartrate (LOPRESSOR) injection 2.5 mg, 2.5 mg, Intravenous, Q3H PRN, Ivor Costa, MD   ondansetron (ZOFRAN) injection 4 mg, 4 mg, Intravenous, Q8H PRN,  Ivor Costa, MD   pantoprazole (PROTONIX) injection 40 mg, 40 mg, Intravenous, Q12H, Nena Polio, MD, 40 mg at 04/21/2021 1021  Current Outpatient Medications:    apixaban (ELIQUIS) 2.5 MG TABS tablet, Take 1 tablet (2.5 mg total) by mouth 2 (two) times daily., Disp: 60 tablet, Rfl: 3   carvedilol (COREG) 3.125 MG tablet, Take 3.125 mg by mouth 2 (two) times daily., Disp: , Rfl:    diltiazem (CARDIZEM CD) 360 MG 24 hr capsule, Take 1 capsule (360 mg total) by mouth daily., Disp: 30 capsule, Rfl: 2   epoetin alfa (EPOGEN) 10000 UNIT/ML injection, Inject 1 mL (10,000 Units total) into the vein every Monday, Wednesday, and Friday with hemodialysis., Disp: 1 mL, Rfl:    fenofibrate 160 MG tablet, Take 160 mg by mouth at bedtime., Disp: , Rfl:    ferrous sulfate 325 (65 FE) MG tablet, Take  325 mg by mouth 2 (two) times daily with a meal., Disp: , Rfl:    Vitamin D, Ergocalciferol, (DRISDOL) 1.25 MG (50000 UNIT) CAPS capsule, Take 1 capsule (50,000 Units total) by mouth every 7 (seven) days., Disp: 12 capsule, Rfl: 0   BD POSIFLUSH 0.9 % SOLN injection, FLUSH WITH 5ML TWICE DAILY (Patient not taking: Reported on 04/15/2021), Disp: 300 mL, Rfl: 0   carvedilol (COREG) 6.25 MG tablet, Take 1 tablet (6.25 mg total) by mouth 2 (two) times daily with a meal. (Patient not taking: Reported on 04/10/2021), Disp: 60 tablet, Rfl: 2   HYDROcodone-acetaminophen (NORCO/VICODIN) 5-325 MG tablet, Take 1 tablet by mouth every 6 (six) hours as needed for moderate pain. (Patient not taking: Reported on 04/10/2021), Disp: 15 tablet, Rfl: 0   insulin NPH-regular Human (70-30) 100 UNIT/ML injection, Hold until followup with PCP, as I don't think you need insulin anymore. (Patient not taking: Reported on 04/24/2021), Disp: , Rfl:   Family History  Problem Relation Age of Onset   Hypertension Father      Social History   Tobacco Use   Smoking status: Former   Smokeless tobacco: Never  Vaping Use   Vaping Use: Never used  Substance Use Topics   Alcohol use: Not Currently    Comment: rare once a year   Drug use: Never    Allergies as of 04/21/2021 - Review Complete 04/27/2021  Allergen Reaction Noted   Aspirin Other (See Comments) 10/20/2016   Codeine Nausea And Vomiting 02/20/2011    Review of Systems:    All systems reviewed and negative except where noted in HPI.   Physical Exam:  Vital signs in last 24 hours: Temp:  [97.4 F (36.3 C)] 97.4 F (36.3 C) (01/29 0745) Pulse Rate:  [117-168] 120 (01/29 1200) Resp:  [16-24] 20 (01/29 1200) BP: (101-114)/(34-44) 103/38 (01/29 1200) SpO2:  [96 %-99 %] 96 % (01/29 1200) Weight:  [93.4 kg] 93.4 kg (01/29 0742)   General:   Pleasant, cooperative in NAD Head:  Normocephalic and atraumatic. Eyes:   No icterus.   Conjunctiva pale. PERRLA. Ears:   Normal auditory acuity. Neck:  Supple; no masses or thyroidomegaly Lungs: Respirations even and unlabored. Lungs clear to auscultation bilaterally.   No wheezes, crackles, or rhonchi.  Heart:  Regular rate and rhythm;  Without murmur, clicks, rubs or gallops Abdomen:  Soft, nondistended, nontender. Normal bowel sounds. No appreciable masses or hepatomegaly.  No rebound or guarding.  Rectal:  Not performed. Msk:  Symmetrical without gross deformities.  Strength generalized weakness Extremities:  Without edema, cyanosis or clubbing. Neurologic:  Alert and oriented x3;  grossly normal neurologically. Skin:  Intact without significant lesions or rashes. Psych:  Alert and cooperative. Normal affect.  LAB RESULTS: CBC Latest Ref Rng & Units 04/28/2021 04/30/2021 04/24/2021  WBC 4.0 - 10.5 K/uL 5.8 7.6 6.9  Hemoglobin 13.0 - 17.0 g/dL 8.3(L) 9.3(L) 9.4(L)  Hematocrit 39.0 - 52.0 % 28.0(L) 30.5(L) 30.4(L)  Platelets 150 - 400 K/uL 89(L) 101(L) 159    BMET BMP Latest Ref Rng & Units 04/13/2021 04/24/2021 03/06/2021  Glucose 70 - 99 mg/dL 160(H) 129(H) 111(H)  BUN 8 - 23 mg/dL 39(H) 18 33(H)  Creatinine 0.61 - 1.24 mg/dL 5.07(H) 3.86(H) 6.20(H)  Sodium 135 - 145 mmol/L 135 133(L) 132(L)  Potassium 3.5 - 5.1 mmol/L 3.3(L) 3.1(L) 3.2(L)  Chloride 98 - 111 mmol/L 101 102 99  CO2 22 - 32 mmol/L 23 23 25   Calcium 8.9 - 10.3 mg/dL 7.9(L) 7.8(L) 7.9(L)    LFT Hepatic Function Latest Ref Rng & Units 04/10/2021 04/24/2021 03/01/2021  Total Protein 6.5 - 8.1 g/dL 5.7(L) 5.7(L) 4.9(L)  Albumin 3.5 - 5.0 g/dL 2.0(L) 2.2(L) 2.2(L)  AST 15 - 41 U/L 26 29 15   ALT 0 - 44 U/L 7 9 15   Alk Phosphatase 38 - 126 U/L 47 37(L) 34(L)  Total Bilirubin 0.3 - 1.2 mg/dL 0.8 0.6 0.7  Bilirubin, Direct 0.0 - 0.2 mg/dL - - -     STUDIES: DG Chest 2 View  Result Date: 04/07/2021 CLINICAL DATA:  Pt to ED via [facility] from home for generalized weakness. Pt has had decreased appetite over the last few days. Pt is dialysis  pt, M,W,F schedule. Pt did have dialysis on Friday but has declined since then. Pt is in A. Fib around 114-120, pt has hx/o same. Pt is having bilateral rib pain and pt did have some numbness in his left arm yesterday. Pt has very pale and has swelling in bilateral lower legs EXAM: CHEST - 2 VIEW COMPARISON:  05/01/2021 at 8:01 a.m., and older studies. FINDINGS: Left retrocardiac opacity is unchanged suspected to be atelectasis, less likely pneumonia, with a small effusion. No new lung opacities. Stable cardiac silhouette. Stable right internal jugular tunneled central venous catheter. No pneumothorax. IMPRESSION: 1. No change from the exam obtained approximately 1 hour earlier. Electronically Signed   By: Lajean Manes M.D.   On: 04/21/2021 09:32   CT Head Wo Contrast  Result Date: 05/04/2021 CLINICAL DATA:  81 year old male with history of generalized weakness. Decreased appetite. Evaluate for potential stroke. EXAM: CT HEAD WITHOUT CONTRAST TECHNIQUE: Contiguous axial images were obtained from the base of the skull through the vertex without intravenous contrast. RADIATION DOSE REDUCTION: This exam was performed according to the departmental dose-optimization program which includes automated exposure control, adjustment of the mA and/or kV according to patient size and/or use of iterative reconstruction technique. COMPARISON:  Head CT 03/22/2021. FINDINGS: Brain: Mild cerebral atrophy. In the posterior right frontal region (axial image 17 of series 2) there is a new area of relatively well-defined low-attenuation which does not affect the overlying cortical gray matter. No evidence of acute infarction, hemorrhage, hydrocephalus, extra-axial collection or mass lesion/mass effect. Vascular: No hyperdense vessel or unexpected calcification. Skull: Normal. Negative for fracture or focal lesion. Sinuses/Orbits: No acute finding. Other: None. IMPRESSION: 1. New age-indeterminate but nonacute infarct in the right  posterior frontal region, which could represent a late subacute infarct. 2. No other acute findings. Electronically Signed   By: Vinnie Langton M.D.   On: 04/30/2021  10:05   DG Chest Portable 1 View  Result Date: 05/04/2021 CLINICAL DATA:  Chest pain EXAM: PORTABLE CHEST 1 VIEW COMPARISON:  03/11/2021 FINDINGS: Indistinct retrocardiac density with blunting at the lateral left costophrenic sulcus. Chronic cardiomegaly. Dialysis catheter with tips at the right atrium. No edema pneumothorax. IMPRESSION: Retrocardiac infiltrate or atelectasis with small pleural effusion. Electronically Signed   By: Jorje Guild M.D.   On: 04/16/2021 08:27      Impression / Plan:   Gregory Dixon is a 81 y.o. male with history of ESRD on hemodialysis, hypertension, A. fib with RVR, diabetes, history of acute calculus cholecystitis s/p robotic assisted laparoscopic cholecystectomy on 04/24/2021, history of hemorrhoidectomy in 11/22 who presented with generalized weakness, numbness in left arm, lack of appetite.  GI is consulted for possible GI bleed given history of black stool.  ?  Black stool Patient's hemoglobin is at baseline and he is on oral iron BUN and creatinine are at baseline Patient underwent EGD in 11/22 which was unremarkable Likely, black stools secondary to oral iron intake Okay to start Protonix 40 mg p.o. twice daily I do not recommend repeat upper endoscopy at this time unless his hemoglobin significantly drops Recommend to discontinue oral iron as his most recent serum ferritin levels are above normal Okay to restart anticoagulation given his history of A. fib with RVR, benefit overweighs risks given low suspicion for GI bleed  Thank you for involving me in the care of this patient.  Please call us back with questions or concerns    LOS: 0 days   Sherri Sear, MD  04/10/2021, 1:17 PM    Note: This dictation was prepared with Dragon dictation along with smaller phrase technology. Any  transcriptional errors that result from this process are unintentional.

## 2021-05-05 NOTE — H&P (Addendum)
History and Physical    Patient: Gregory Dixon FMB:[id number] DOB: [birth year] DOA: 04/11/2021 DOS: the patient was seen and examined on 05/06/2021 PCP: Valera Castle, MD  Patient coming from:  home  Chief Complaint: Black stool diarrhea, left arm weakness  HPI: Gregory Dixon is a 81 y.o. male with medical history significant of ESRD-HD (MWF), A fib on Eliquis, hypertension, hyperlipidemia, diabetes mellitus, anemia, GI bleeding, BPH, right blindness, MGUS, s/p of robotic assisted laparoscopic cholecystectomy on 04/24/2021, COVID-19 infection 02/28/2021, hemorrhoidectomy in 02/2021, who presents with black stool diarrhea, left arm weakness.  Patient states that he has intermittent black stool diarrhea for almost 2 weeks.  He denies nausea, vomiting, abdominal pain.  He has a generalized weakness and also reports left arm and left hand weakness in the past 3 days. He states that he drops things in left hand.  Denies weakness in legs.  No facial droop or slurred speech.  He also reports mild left lower chest pain.  He states that he does not have chest pain at rest, but moving his left arm causing mild left lower chest pain.  Denies cough, shortness breath, fever or chills.  He states that he took his last dose of Eliquis last night.  Data review and ED course: I have personally reviewed labs and imaging studies.  Troponin level 721, hemoglobin 9.3 (9.4 on 04/24/2021), WBC 7.6, pending COVID PCR, potassium 3.3, bicarbonate 23, creatinine 5.07, BUN 39, temperature 97.4, blood pressure 114/44, A. fib with RVR with heart rate up to 168 --> 120s, RR 24, oxygen saturation 96% on room air.  Chest x-ray showed possible retrocardiac infiltration.  CT of head showed new age-indeterminate but nonacute infarct in the right posterior frontal region, which could represent a late subacute infarct.  Patient is admitted to progressive bed as inpatient.  Dr. Marius Ditch of GI, Dr. Cheral Marker of neurology, Dr. Clayborn Bigness of  cardiology, Dr. Theador Hawthorne of renal are consulted.  EKG: Reviewed KEG independently.  Atrial fibrillation, QTC 519, heart rate 147, low voltage, poor R wave progression, ST depression in V4-V6 and inferior leads.  Review of Systems:   General: no fevers, chills, no body weight gain, has poor appetite, has fatigue HEENT: no hearing changes or sore throat. Has left eye blindness Respiratory: no dyspnea, coughing, wheezing CV: has chest pain, no palpitations GI: no nausea, vomiting, abdominal pain, constipation. Has black stool diarrhea GU: no dysuria, burning on urination, increased urinary frequency, hematuria  Ext: has leg edema Neuro: has left  arm and hand weakness, no vision change or hearing loss Skin: no rash, no skin tear. MSK: No muscle spasm, no deformity, no limitation of range of movement in spin Heme: No easy bruising.  Travel history: No recent long distant travel.   Past Medical History:  Diagnosis Date   Actinic keratosis    Anemia    Basal cell carcinoma 10/21/2017   left lateral neck, excised 02/09/2018   Basal cell carcinoma (BCC) 05/12/2019   right posterior ear, excised 05/31/2019   Blood transfusion without reported diagnosis    Chronic kidney disease    Diabetes mellitus without complication ([place])    Dysrhythmia    History of kidney stones    Hypertension    Past Surgical History:  Procedure Laterality Date   COLONOSCOPY N/A 02/24/2021   Procedure: COLONOSCOPY;  Surgeon: [name], MD;  Location: [facility] ENDOSCOPY;  Service: Endoscopy;  Laterality: N/A;   COLONOSCOPY WITH PROPOFOL N/A 04/29/2019   Procedure: COLONOSCOPY WITH PROPOFOL;  Surgeon:  Jonathon Bellows, MD;  Location: [facility] ENDOSCOPY;  Service: Gastroenterology;  Laterality: N/A;   DIALYSIS/PERMA CATHETER INSERTION Bilateral 01/28/2021   Procedure: DIALYSIS/PERMA CATHETER INSERTION;  Surgeon: Algernon Huxley, MD;  Location: [facility];  Service: Cardiovascular;  Laterality: Bilateral;    ESOPHAGOGASTRODUODENOSCOPY N/A 02/24/2021   Procedure: ESOPHAGOGASTRODUODENOSCOPY (EGD);  Surgeon: [name], MD;  Location: [facility] ENDOSCOPY;  Service: Endoscopy;  Laterality: N/A;   ESOPHAGOGASTRODUODENOSCOPY (EGD) WITH PROPOFOL N/A 04/29/2019   Procedure: ESOPHAGOGASTRODUODENOSCOPY (EGD) WITH PROPOFOL;  Surgeon: Jonathon Bellows, MD;  Location: [facility] ENDOSCOPY;  Service: Gastroenterology;  Laterality: N/A;   HEMORRHOID SURGERY N/A 03/02/2021   Procedure: HEMORRHOIDECTOMY;  Surgeon: Jules Husbands, MD;  Location: [facility] ORS;  Service: General;  Laterality: N/A;   IR PERC CHOLECYSTOSTOMY  01/25/2021   IR PERC CHOLECYSTOSTOMY  03/11/2021   TONSILLECTOMY     Social History:  reports that he has quit smoking. He has never used smokeless tobacco. He reports that he does not currently use alcohol. He reports that he does not use drugs.  Allergies  Allergen Reactions   Aspirin Other (See Comments)    GI bleed   Codeine Nausea And Vomiting    Family History  Problem Relation Age of Onset   Hypertension Father     Prior to Admission medications   Medication Sig Start Date End Date Taking? Authorizing Provider  apixaban (ELIQUIS) 2.5 MG TABS tablet Take 1 tablet (2.5 mg total) by mouth 2 (two) times daily. 02/26/21   Elgergawy, Silver Huguenin, MD  BD POSIFLUSH 0.9 % SOLN injection FLUSH WITH 5ML TWICE DAILY 04/07/21   Ronny Bacon, MD  carvedilol (COREG) 3.125 MG tablet Take 3.125 mg by mouth 2 (two) times daily. 03/19/21   [provider]  carvedilol (COREG) 6.25 MG tablet Take 1 tablet (6.25 mg total) by mouth 2 (two) times daily with a meal. Patient not taking: Reported on 04/10/2021 03/06/21 06/04/21  Enzo Bi, MD  diltiazem (CARDIZEM CD) 360 MG 24 hr capsule Take 1 capsule (360 mg total) by mouth daily. 03/06/21 06/04/21  Enzo Bi, MD  epoetin alfa (EPOGEN) 10000 UNIT/ML injection Inject 1 mL (10,000 Units total) into the vein every Monday, Wednesday, and Friday with hemodialysis. 03/06/21    Enzo Bi, MD  fenofibrate 160 MG tablet Take 160 mg by mouth at bedtime.    [provider]  ferrous sulfate 325 (65 FE) MG tablet Take 325 mg by mouth 2 (two) times daily with a meal.    [provider]  HYDROcodone-acetaminophen (NORCO/VICODIN) 5-325 MG tablet Take 1 tablet by mouth every 6 (six) hours as needed for moderate pain. 04/24/21   Ronny Bacon, MD  insulin NPH-regular Human (70-30) 100 UNIT/ML injection Hold until followup with PCP, as I don't think you need insulin anymore. Patient not taking: Reported on 04/24/2021 03/06/21   Enzo Bi, MD  Vitamin D, Ergocalciferol, (DRISDOL) 1.25 MG (50000 UNIT) CAPS capsule Take 1 capsule (50,000 Units total) by mouth every 7 (seven) days. 02/07/21 05/08/21  Val Riles, MD    Physical Exam: Vitals:   05/06/21 0930 05/06/21 0945 05/06/21 1000 05/06/21 1015  BP: (!) 97/43 (!) 97/40 (!) 98/39 (!) 93/39  Pulse: (!) 139 (!) 151 (!) 164 (!) 150  Resp: (!) 21 (!) 23 (!) 23 (!) 24  Temp:      TempSrc:      SpO2: 98% 98% 98% 98%  Weight:      Height:        Physical exam:  General: Not in acute distress. Pale looking HEENT:       Eyes: has Has left eye blindness. Right eye PERRL, EOMI, no scleral icterus.       ENT: No discharge from the ears and nose, no pharynx injection, no tonsillar enlargement.        Neck: No JVD, no bruit, no mass felt. Heme: No neck lymph node enlargement. Cardiac: S1/S2, RRR, No murmurs, No gallops or rubs. Respiratory: No rales, wheezing, rhonchi or rubs. GI: Soft, nondistended, nontender, no rebound pain, no organomegaly, BS present. GU: No hematuria Ext: has 1+ pitting leg edema bilaterally. 1+DP/PT pulse bilaterally. Musculoskeletal: No joint deformities, No joint redness or warmth, no limitation of ROM in spin. Skin: No rashes.  Neuro: Alert, oriented X3, cranial nerves II-XII grossly intact, moves all extremities normally.  Left arm is minimally weaker. Psych: Patient is not  psychotic, no suicidal or hemocidal ideation.   Assessment/Plan * Stroke [facility])- (present on admission) CT-head showed  CT of head showed new age-indeterminate but nonacute infarct in the right posterior frontal region, which could represent a late subacute infarct.  Pt has left arm and hand weakness. Dr. Cheral Marker of neuro is consulted.  -Admitted to progressive unit as inpatient - Obtain MRI/MRA  - Patient is on fenofibrate - Patient is allergic to aspirin - will start Lipitor 40 mg daily - fasting lipid panel and HbA1c  - 2D transthoracic echocardiography  - swallowing screen. If fails, will get SLP - PT/OT consult  Anemia in ESRD (end-stage renal disease) ([place])- (present on admission) See A & p under GIB  GI bleeding- (present on admission) Patient reports intermittent black stool and diarrhea for 2 weeks.  Hemoglobin stable 9.3 today (9.4 on 04/24/2021.  Dr. Marius Ditch of GI is consulted, does not think patient need endoscopy today. Per Dr. Marius Ditch, will "discontinue oral iron as his most recent serum ferritin levels are above normal, and Okay to continue Eliquis given his history of A. fib with RVR, benefit overweighs risks given low suspicion for GI bleed". I will hold off Eliquis today and restart Eliquis tomorrow, to make sure hemoglobin does not drop further. -Protonix 40 mg twice daily -Check CBC every 6 hours -Transfuse blood if hemoglobin < 7.0 -hold off iron supplement per Dr. Verlin Grills recommendation -check C diff and GI path panel  Atrial fibrillation with RVR ([place])- (present on admission) HR is up to 168 --> 120s. Dr. Clayborn Bigness of card is consulted. -prn metoprolol 2.5 mg daily 2 hours as needed -If heart rate is not controlled, will start Cardizem drip -Continue home Coreg and Cardizem -hold Eliquis today --> if hemoglobin stable, will need to restart Eliquis tomorrow  End-stage renal disease on hemodialysis (HCC) - Consulted Dr. Theador Hawthorne of renal for dialysis  Essential  hypertension- (present on admission) Blood pressure 114/44 - Continue Cardizem and Coreg  Chest pain- (present on admission) Chest x-ray showed a possible retrocardiac infiltration, Procalcitonin 0.80, but the patient does not have fever or leukocytosis, denies cough or shortness breath, oxygen saturation 96% on room air, clinically does not seem to have pneumonia.  Patient received 1 dose of Rocephin and azithromycin.  I will hold antibiotics now. -Follow-up blood culture -As needed albuterol  Elevated troponin- (present on admission) Troponin level 721.  Patient reports left lower chest pain which only happens on movement.  Since patient has stroke and A. fib with RVR, this may be due to demand ischemia.  Cardiology was consulted. -Lipitor, fenofibrate -Coreg -Trend troponin -Check  A1c, FLP -Patient is allergic to aspirin -Follow-up with Dr. Etta Quill recommendations  Hyperlipidemia- (present on admission) - Continue fenofibrate -Newly added Lipitor 40 g daily  Type II diabetes mellitus with renal manifestations ([place])- (present on admission) Recent A1c 5.6 on 01/25/2021.  Patient is not taking his NPH insulin currently -Sliding scale insulin  Hypokalemia- (present on admission) Potassium 3.3 -Will not replete potassium since patient is ESRD  Thrombocytopenia ([place])- (present on admission) This is chronic issue.  Platelet 101 -Follow-up with CBC        Advance Care Planning:   Code Status: DNR (I discussed with patient in the presence of his wife, and explained the meaning of CODE STATUS. Patient wants to be DNR)  Consults: Dr. Marius Ditch of GI, Dr. Cheral Marker of neurology, Dr. Clayborn Bigness of cardiology, Dr. Theador Hawthorne of renal are consulted.  Family Communication:  wife is at bedside  DVT prophylaxis: SCD  Severity of Illness: The appropriate patient status for this patient is INPATIENT. Inpatient status is judged to be reasonable and necessary in order to provide the required  intensity of service to ensure the patient's safety. The patient's presenting symptoms, physical exam findings, and initial radiographic and laboratory data in the context of their chronic comorbidities is felt to place them at high risk for further clinical deterioration. Furthermore, it is not anticipated that the patient will be medically stable for discharge from the hospital within 2 midnights of admission.   * I certify that at the point of admission it is my clinical judgment that the patient will require inpatient hospital care spanning beyond 2 midnights from the point of admission due to high intensity of service, high risk for further deterioration and high frequency of surveillance required.*  Author: Ivor Costa, MD 05/06/2021 11:22 AM  For on call review [email or web address].

## 2021-05-05 NOTE — ED Notes (Signed)
Pt also reports black stools. Bedside Hemoccult done by Dr. Cinda Quest positive for occult blood in stool.

## 2021-05-05 NOTE — ED Notes (Signed)
Blaine Hamper, MD aware of patient's HR in the 110's-130's and BP of 101/43. No new orders at this time.
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
## 2021-05-05 NOTE — Assessment & Plan Note (Signed)
Potassium 3.3 -Will not replete potassium since patient is ESRD

## 2021-05-05 NOTE — Progress Notes (Signed)
OT Cancellation Note  Patient Details Name: Gregory Dixon MRN: [id number] DOB: October 19, 1940   Cancelled Treatment:    Reason Eval/Treat Not Completed: Patient at procedure or test/ unavailable. Consult received, chart reviewed. Pt out of the room for imaging. Will re-attempt OT evaluation at later date/time as pt is available and appropriate.   Ardeth Perfect., MPH, MS, OTR/L ascom [phone] 04/23/2021, 1:40 PM
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
## 2021-05-05 NOTE — Assessment & Plan Note (Signed)
This is chronic issue.  Platelet 101 -Follow-up with CBC

## 2021-05-05 NOTE — ED Notes (Signed)
Patient at MRI 

## 2021-05-05 NOTE — ED Notes (Signed)
Critical Result: troponin 721  Cinda Quest, MD aware

## 2021-05-05 NOTE — Consult Note (Signed)
CARDIOLOGY CONSULT NOTE               Patient ID: Gregory Dixon MRN: [id number] DOB/AGE: [birth year] 81 y.o.  Admit date: 04/08/2021 Referring Physician Dr. Ivor Costa hospitalist Primary Physician Olmedo MD Primary Cardiologist Fath/Orgel  Reason for Consultation atrial fibrillation weakness Stools  HPI: Patient 81 year old history of end-stage renal disease on dialysis atrial fibrillation on Eliquis hypertension hyperlipidemia diabetes anemia GI bleeding MGUS.  Patient presented with intermittent dark stools diarrhea for most 2 weeks patient complains of generalized weakness found to have left hand weakness imaging suggested a new stroke patient has been compliant with his Eliquis denies any chest pain.  Elevated troponin over 700 unclear etiology possible demand ischemia versus CVA now here for evaluation  Review of systems complete and found to be negative unless listed above     Past Medical History:  Diagnosis Date   Actinic keratosis    Anemia    Basal cell carcinoma 10/21/2017   left lateral neck, excised 02/09/2018   Basal cell carcinoma (BCC) 05/12/2019   right posterior ear, excised 05/31/2019   Blood transfusion without reported diagnosis    Chronic kidney disease    Diabetes mellitus without complication ([place])    Dysrhythmia    History of kidney stones    Hypertension     Past Surgical History:  Procedure Laterality Date   COLONOSCOPY N/A 02/24/2021   Procedure: COLONOSCOPY;  Surgeon: [name], MD;  Location: [facility] ENDOSCOPY;  Service: Endoscopy;  Laterality: N/A;   COLONOSCOPY WITH PROPOFOL N/A 04/29/2019   Procedure: COLONOSCOPY WITH PROPOFOL;  Surgeon: Jonathon Bellows, MD;  Location[facility] ENDOSCOPY;  Service: Gastroenterology;  Laterality: N/A;   DIALYSIS/PERMA CATHETER INSERTION Bilateral 01/28/2021   Procedure: DIALYSIS/PERMA CATHETER INSERTION;  Surgeon: Algernon Huxley, MD;  Location: [place] CV LAB;  Service: Cardiovascular;  Laterality: Bilateral;    ESOPHAGOGASTRODUODENOSCOPY N/A 02/24/2021   Procedure: ESOPHAGOGASTRODUODENOSCOPY (EGD);  Surgeon: [name], MD;  Location[facility] ENDOSCOPY;  Service: Endoscopy;  Laterality: N/A;   ESOPHAGOGASTRODUODENOSCOPY (EGD) WITH PROPOFOL N/A 04/29/2019   Procedure: ESOPHAGOGASTRODUODENOSCOPY (EGD) WITH PROPOFOL;  Surgeon: Jonathon Bellows, MD;  Location: [facility] ENDOSCOPY;  Service: Gastroenterology;  Laterality: N/A;   HEMORRHOID SURGERY N/A 03/02/2021   Procedure: HEMORRHOIDECTOMY;  Surgeon: Jules Husbands, MD;  Location: [facility] ORS;  Service: General;  Laterality: N/A;   IR PERC CHOLECYSTOSTOMY  01/25/2021   IR PERC CHOLECYSTOSTOMY  03/11/2021   TONSILLECTOMY      (Not in a hospital admission)  Social History   Socioeconomic History   Marital status: Married    Spouse name: Not on file   Number of children: Not on file   Years of education: Not on file   Highest education level: Not on file  Occupational History   Not on file  Tobacco Use   Smoking status: Former   Smokeless tobacco: Never  Vaping Use   Vaping Use: Never used  Substance and Sexual Activity   Alcohol use: Not Currently    Comment: rare once a year   Drug use: Never   Sexual activity: Not Currently  Other Topics Concern   Not on file  Social History Narrative   Not on file   Social Determinants of Health   Financial Resource Strain: Not on file  Food Insecurity: Not on file  Transportation Needs: Not on file  Physical Activity: Not on file  Stress: Not on file  Social Connections: Not on file  Intimate Partner Violence: Not on file  Family History  Problem Relation Age of Onset   Hypertension Father       Review of systems complete and found to be negative unless listed above      PHYSICAL EXAM  General: Well developed, well nourished, in no acute distress HEENT:  Normocephalic and atramatic Neck:  No JVD.  Lungs: Clear bilaterally to auscultation and percussion. Heart: Irregular irregular. Normal  S1 and S2 without gallops or murmurs.  Abdomen: Bowel sounds are positive, abdomen soft and non-tender  Msk:  Back normal, normal gait. Normal strength and tone for age. Extremities: No clubbing, cyanosis or edema.   Neuro: Alert and oriented X 3. Psych:  Good affect, responds appropriately  Labs:   Lab Results  Component Value Date   WBC 5.8 04/29/2021   HGB 8.3 (L) 04/24/2021   HCT 28.0 (L) 04/08/2021   MCV 94.9 04/08/2021   PLT 89 (L) 04/08/2021    Recent Labs  Lab 04/27/2021 0757  NA 135  K 3.3*  CL 101  CO2 23  BUN 39*  CREATININE 5.07*  CALCIUM 7.9*  PROT 5.7*  BILITOT 0.8  ALKPHOS 47  ALT 7  AST 26  GLUCOSE 160*   Lab Results  Component Value Date   CKTOTAL 46 (L) 02/13/2021   No results found for: CHOL No results found for: HDL No results found for: LDLCALC No results found for: TRIG No results found for: CHOLHDL No results found for: LDLDIRECT    Radiology: DG Chest 2 View  Result Date: 04/18/2021 CLINICAL DATA:  Pt to ED via [facility] from home for generalized weakness. Pt has had decreased appetite over the last few days. Pt is dialysis pt, M,W,F schedule. Pt did have dialysis on Friday but has declined since then. Pt is in A. Fib around 114-120, pt has hx/o same. Pt is having bilateral rib pain and pt did have some numbness in his left arm yesterday. Pt has very pale and has swelling in bilateral lower legs EXAM: CHEST - 2 VIEW COMPARISON:  04/24/2021 at 8:01 a.m., and older studies. FINDINGS: Left retrocardiac opacity is unchanged suspected to be atelectasis, less likely pneumonia, with a small effusion. No new lung opacities. Stable cardiac silhouette. Stable right internal jugular tunneled central venous catheter. No pneumothorax. IMPRESSION: 1. No change from the exam obtained approximately 1 hour earlier. Electronically Signed   By: Lajean Manes M.D.   On: 04/25/2021 09:32   CT Head Wo Contrast  Result Date: 04/20/2021 CLINICAL DATA:  81 year old male  with history of generalized weakness. Decreased appetite. Evaluate for potential stroke. EXAM: CT HEAD WITHOUT CONTRAST TECHNIQUE: Contiguous axial images were obtained from the base of the skull through the vertex without intravenous contrast. RADIATION DOSE REDUCTION: This exam was performed according to the departmental dose-optimization program which includes automated exposure control, adjustment of the mA and/or kV according to patient size and/or use of iterative reconstruction technique. COMPARISON:  Head CT 03/22/2021. FINDINGS: Brain: Mild cerebral atrophy. In the posterior right frontal region (axial image 17 of series 2) there is a new area of relatively well-defined low-attenuation which does not affect the overlying cortical gray matter. No evidence of acute infarction, hemorrhage, hydrocephalus, extra-axial collection or mass lesion/mass effect. Vascular: No hyperdense vessel or unexpected calcification. Skull: Normal. Negative for fracture or focal lesion. Sinuses/Orbits: No acute finding. Other: None. IMPRESSION: 1. New age-indeterminate but nonacute infarct in the right posterior frontal region, which could represent a late subacute infarct. 2. No other acute findings. Electronically  Signed   By: Vinnie Langton M.D.   On: 04/24/2021 10:05   DG Chest Portable 1 View  Result Date: 04/14/2021 CLINICAL DATA:  Chest pain EXAM: PORTABLE CHEST 1 VIEW COMPARISON:  03/11/2021 FINDINGS: Indistinct retrocardiac density with blunting at the lateral left costophrenic sulcus. Chronic cardiomegaly. Dialysis catheter with tips at the right atrium. No edema pneumothorax. IMPRESSION: Retrocardiac infiltrate or atelectasis with small pleural effusion. Electronically Signed   By: Jorje Guild M.D.   On: 05/04/2021 08:27    EKG: Atrial fibrillation rate of 110  ASSESSMENT AND PLAN:  Atrial fibrillation rapid ventricular spots Elevated troponin Hypertension Dark stools GI bleed Hypertension End-stage  renal disease on dialysis CVA Anemia Diabetes Hyperlipidemia Thrombocytopenia atrial fibrillation rate of 110 . Plan Consider holding anticoagulation until GI work-up complete Consider switching to heparin for now Agree with neurology input for recent CVA Continue blood pressure management and control Increased heart rate management and control with beta-blocker or Cardizem Continue end-stage renal disease with dialysis management Continue to follow H&H for anemia GI bleeding Agree with diabetes management and control Maintain statin therapy for hyperlipidemia   Signed: Yolonda Kida MD 04/22/2021, 11:59 AM

## 2021-05-05 NOTE — Assessment & Plan Note (Signed)
CT-head showed  CT of head showed new age-indeterminate but nonacute infarct in the right posterior frontal region, which could represent a late subacute infarct.  Pt has left arm and hand weakness. Dr. Cheral Marker of neuro is consulted.  -Admitted to progressive unit as inpatient - Obtain MRI/MRA  - Patient is on fenofibrate - Patient is allergic to aspirin - will start Lipitor 40 mg daily - fasting lipid panel and HbA1c  - 2D transthoracic echocardiography  - swallowing screen. If fails, will get SLP - PT/OT consult

## 2021-05-05 NOTE — Assessment & Plan Note (Addendum)
Patient reports intermittent black stool and diarrhea for 2 weeks.  Hemoglobin stable 9.3 today (9.4 on 04/24/2021.  Dr. Marius Ditch of GI is consulted, does not think patient need endoscopy today. Per Dr. Marius Ditch, will "discontinue oral iron as his most recent serum ferritin levels are above normal, and Okay to continue Eliquis given his history of A. fib with RVR, benefit overweighs risks given low suspicion for GI bleed". I will hold off Eliquis today and restart Eliquis tomorrow, to make sure hemoglobin does not drop further. -Protonix 40 mg twice daily -Check CBC every 6 hours -Transfuse blood if hemoglobin < 7.0 -hold off iron supplement per Dr. Verlin Grills recommendation -check C diff and GI path panel

## 2021-05-05 NOTE — Assessment & Plan Note (Signed)
-   Consulted Dr. Theador Hawthorne of renal for dialysis

## 2021-05-05 NOTE — ED Triage Notes (Signed)
Pt to ED via [facility] from home for generalized weakness. Pt has had decreased appetite over the last few days. Pt is dialysis pt, M,W,F schedule. Pt did have dialysis on Friday but has declined since then. Pt is in A. Fib around 114-120, pt has hx/o same. Pt is having bilateral rib pain and pt did have some numbness in his left arm yesterday. Pt has very pale and has swelling in bilateral lower legs. Pt is in NAD.   Pts CBG 180.

## 2021-05-05 NOTE — Assessment & Plan Note (Signed)
-   Continue fenofibrate -Newly added Lipitor 40 g daily

## 2021-05-05 NOTE — Assessment & Plan Note (Addendum)
Chest x-ray showed a possible retrocardiac infiltration, Procalcitonin 0.80, but the patient does not have fever or leukocytosis, denies cough or shortness breath, oxygen saturation 96% on room air, clinically does not seem to have pneumonia.  Patient received 1 dose of Rocephin and azithromycin.  I will hold antibiotics now. -Follow-up blood culture -As needed albuterol

## 2021-05-05 NOTE — Assessment & Plan Note (Signed)
See A & p under GIB

## 2021-05-05 NOTE — ED Notes (Signed)
Paper copy of dialysis consent signed by patient at this time.

## 2021-05-05 NOTE — Progress Notes (Signed)
SLP Cancellation Note  Patient Details Name: Gregory Dixon MRN: [id number] DOB: 01-Feb-1941   Cancelled treatment:       Reason Eval/Treat Not Completed: Medical issues which prohibited therapy (pt with pending STAT imaging. Per chart review review, pt A&Ox4 with no changes to speech/language/cognition noted. Will complete cognitive-linguistic evaluation next date, as appropriate.)   Cherrie Gauze, M.S., [facility] [phone] [name]   [name] 04/22/2021, 11:41 AM

## 2021-05-05 NOTE — ED Notes (Signed)
Blaine Hamper, MD aware of patient VS. Told by MD to hold cardizem at this time.

## 2021-05-05 NOTE — ED Notes (Signed)
Patient given warm blankets and lights in room turned off per patient request. Call light within reach. No needs expressed to RN at this time.

## 2021-05-05 NOTE — ED Notes (Signed)
Patient given cereal and apple sauce as requested.

## 2021-05-05 NOTE — Assessment & Plan Note (Signed)
Blood pressure 114/44 - Continue Cardizem and Coreg

## 2021-05-05 NOTE — Assessment & Plan Note (Signed)
Recent A1c 5.6 on 01/25/2021.  Patient is not taking his NPH insulin currently -Sliding scale insulin

## 2021-05-05 NOTE — Assessment & Plan Note (Signed)
Troponin level 721.  Patient reports left lower chest pain which only happens on movement.  Since patient has stroke and A. fib with RVR, this may be due to demand ischemia.  Cardiology was consulted. -Lipitor, fenofibrate -Coreg -Trend troponin -Check A1c, FLP -Patient is allergic to aspirin -Follow-up with Dr. Etta Quill recommendations

## 2021-05-05 NOTE — Assessment & Plan Note (Addendum)
HR is up to 168 --> 120s. Dr. Clayborn Bigness of card is consulted. -prn metoprolol 2.5 mg daily 2 hours as needed -If heart rate is not controlled, will start Cardizem drip -Continue home Coreg and Cardizem -hold Eliquis today --> if hemoglobin stable, will need to restart Eliquis tomorrow

## 2021-05-06 ENCOUNTER — Other Ambulatory Visit: Payer: Self-pay

## 2021-05-06 ENCOUNTER — Inpatient Hospital Stay
Admit: 2021-05-06 | Discharge: 2021-05-06 | Disposition: A | Discharge status: Home / Self Care | Payer: Medicare Other | Attending: Internal Medicine | Admitting: Internal Medicine

## 2021-05-06 ENCOUNTER — Encounter: Payer: Self-pay | Admitting: Internal Medicine

## 2021-05-06 DIAGNOSIS — Z7189 Other specified counseling: Secondary | ICD-10-CM

## 2021-05-06 DIAGNOSIS — R7881 Bacteremia: Secondary | ICD-10-CM | POA: Diagnosis not present

## 2021-05-06 DIAGNOSIS — I639 Cerebral infarction, unspecified: Secondary | ICD-10-CM | POA: Diagnosis not present

## 2021-05-06 DIAGNOSIS — I330 Acute and subacute infective endocarditis: Secondary | ICD-10-CM | POA: Diagnosis not present

## 2021-05-06 LAB — ECHOCARDIOGRAM COMPLETE
AR max vel: 3.01 cm2
AV Area VTI: 2.3 cm2
AV Area mean vel: 2.73 cm2
AV Mean grad: 6 mmHg
AV Peak grad: 10.5 mmHg
Ao pk vel: 1.62 m/s
Area-P 1/2: 3.97 cm2
Height: 78 in
MV VTI: 1.89 cm2
S' Lateral: 3.76 cm
Weight: 3296 oz

## 2021-05-06 LAB — LIPID PANEL
Cholesterol: 58 mg/dL (ref 0–200)
HDL: <10 mg/dL — ABNORMAL LOW (ref >40)
Triglycerides: 142 mg/dL (ref <150)
VLDL: 28 mg/dL (ref 0–40)

## 2021-05-06 LAB — CBC
HCT: 31.9 % — ABNORMAL LOW (ref 39.0–52.0)
Hemoglobin: 9.6 g/dL — ABNORMAL LOW (ref 13.0–17.0)
MCH: 27.7 pg (ref 26.0–34.0)
MCHC: 30.1 g/dL (ref 30.0–36.0)
MCV: 92.2 fL (ref 80.0–100.0)
Platelets: 99 10*3/uL — ABNORMAL LOW (ref 150–400)
RBC: 3.46 MIL/uL — ABNORMAL LOW (ref 4.22–5.81)
RDW: 16.9 % — ABNORMAL HIGH (ref 11.5–15.5)
WBC: 8.1 10*3/uL (ref 4.0–10.5)
nRBC: 0 % (ref 0.0–0.2)

## 2021-05-06 LAB — URINALYSIS, ROUTINE W REFLEX MICROSCOPIC
Bacteria, UA: NONE SEEN
Glucose, UA: NEGATIVE mg/dL
Nitrite: NEGATIVE
Protein, ur: >300 mg/dL — AB
RBC / HPF: >50 RBC/hpf — ABNORMAL HIGH (ref 0–5)
Specific Gravity, Urine: 1.025 (ref 1.005–1.030)
pH: 5 (ref 5.0–8.0)

## 2021-05-06 LAB — BASIC METABOLIC PANEL
Anion gap: 15 (ref 5–15)
BUN: 49 mg/dL — ABNORMAL HIGH (ref 8–23)
CO2: 18 mmol/L — ABNORMAL LOW (ref 22–32)
Calcium: 7.9 mg/dL — ABNORMAL LOW (ref 8.9–10.3)
Chloride: 103 mmol/L (ref 98–111)
Creatinine, Ser: 6.04 mg/dL — ABNORMAL HIGH (ref 0.61–1.24)
GFR, Estimated: 9 mL/min — ABNORMAL LOW (ref >60)
Glucose, Bld: 122 mg/dL — ABNORMAL HIGH (ref 70–99)
Potassium: 4.3 mmol/L (ref 3.5–5.1)
Sodium: 136 mmol/L (ref 135–145)

## 2021-05-06 LAB — BLOOD CULTURE ID PANEL (REFLEXED) - BCID2

## 2021-05-06 LAB — GASTROINTESTINAL PANEL BY PCR, STOOL (REPLACES STOOL CULTURE)

## 2021-05-06 LAB — CBG MONITORING, ED
Glucose-Capillary: 109 mg/dL — ABNORMAL HIGH (ref 70–99)
Glucose-Capillary: 109 mg/dL — ABNORMAL HIGH (ref 70–99)
Glucose-Capillary: 35 mg/dL — CL (ref 70–99)
Glucose-Capillary: 103 mg/dL — ABNORMAL HIGH (ref 70–99)

## 2021-05-06 LAB — HEMOGLOBIN A1C
Hgb A1c MFr Bld: 5.8 % — ABNORMAL HIGH (ref 4.8–5.6)
Mean Plasma Glucose: 120 mg/dL

## 2021-05-06 MED ORDER — DIPHENHYDRAMINE HCL 50 MG/ML IJ SOLN: 25.0000 mg | INTRAMUSCULAR | Status: DC | PRN

## 2021-05-06 MED ORDER — DEXTROSE 5 % IV SOLN: INTRAVENOUS | Status: DC

## 2021-05-06 MED ORDER — SODIUM CHLORIDE 0.9 % IV BOLUS
250.0000 mL | Freq: Once | INTRAVENOUS | Status: AC
  Administered 2021-05-06: 250 mL via INTRAVENOUS

## 2021-05-06 MED ORDER — SODIUM CHLORIDE 0.9 % IV SOLN
2.0000 g | Freq: Two times a day (BID) | INTRAVENOUS | Status: DC
  Filled 2021-05-06 (×2): qty 2000

## 2021-05-06 MED ORDER — MIDAZOLAM HCL 2 MG/2ML IJ SOLN: 2.0000 mg | INTRAMUSCULAR | Status: DC | PRN

## 2021-05-06 MED ORDER — POLYVINYL ALCOHOL 1.4 % OP SOLN
1.0000 [drp] | Freq: Four times a day (QID) | OPHTHALMIC | Status: DC | PRN
  Filled 2021-05-06: qty 15

## 2021-05-06 MED ORDER — GLYCOPYRROLATE 1 MG PO TABS
1.0000 mg | ORAL_TABLET | ORAL | Status: DC | PRN
  Filled 2021-05-06: qty 1

## 2021-05-06 MED ORDER — LIDOCAINE-PRILOCAINE 2.5-2.5 % EX CREA: 1.0000 "application " | TOPICAL_CREAM | CUTANEOUS | Status: DC | PRN

## 2021-05-06 MED ORDER — ACETAMINOPHEN 325 MG PO TABS: 650.0000 mg | ORAL_TABLET | Freq: Four times a day (QID) | ORAL | Status: DC | PRN

## 2021-05-06 MED ORDER — MORPHINE SULFATE (PF) 2 MG/ML IV SOLN
2.0000 mg | INTRAVENOUS | Status: DC | PRN
  Administered 2021-05-06 – 2021-05-07 (×2): 2 mg via INTRAVENOUS
  Filled 2021-05-06 (×3): qty 1

## 2021-05-06 MED ORDER — MIDODRINE HCL 5 MG PO TABS
2.5000 mg | ORAL_TABLET | Freq: Three times a day (TID) | ORAL | Status: DC
  Administered 2021-05-06: 2.5 mg via ORAL
  Filled 2021-05-06: qty 1

## 2021-05-06 MED ORDER — NOREPINEPHRINE 4 MG/250ML-% IV SOLN
0.0000 ug/min | INTRAVENOUS | Status: DC
  Administered 2021-05-06: 10 ug/min via INTRAVENOUS

## 2021-05-06 MED ORDER — EPOETIN ALFA 10000 UNIT/ML IJ SOLN: 10000.0000 [IU] | INTRAMUSCULAR | Status: DC

## 2021-05-06 MED ORDER — ALTEPLASE 2 MG IJ SOLR
2.0000 mg | Freq: Once | INTRAMUSCULAR | Status: DC | PRN
  Filled 2021-05-06: qty 2

## 2021-05-06 MED ORDER — NOREPINEPHRINE 4 MG/250ML-% IV SOLN
INTRAVENOUS | Status: AC
  Filled 2021-05-06: qty 250

## 2021-05-06 MED ORDER — SODIUM CHLORIDE 0.9 % IV SOLN: 100.0000 mL | INTRAVENOUS | Status: DC | PRN

## 2021-05-06 MED ORDER — GLYCOPYRROLATE 0.2 MG/ML IJ SOLN
0.2000 mg | INTRAMUSCULAR | Status: DC | PRN
  Filled 2021-05-06: qty 1

## 2021-05-06 MED ORDER — ACETAMINOPHEN 325 MG RE SUPP: 650.0000 mg | Freq: Four times a day (QID) | RECTAL | Status: DC | PRN

## 2021-05-06 MED ORDER — SODIUM CHLORIDE 0.9 % IV SOLN
2.0000 g | Freq: Two times a day (BID) | INTRAVENOUS | Status: DC
  Administered 2021-05-06: 2 g via INTRAVENOUS
  Filled 2021-05-06: qty 20

## 2021-05-06 MED ORDER — HEPARIN SODIUM (PORCINE) 1000 UNIT/ML DIALYSIS
1000.0000 [IU] | INTRAMUSCULAR | Status: DC | PRN
  Filled 2021-05-06: qty 1

## 2021-05-06 MED ORDER — DEXTROSE 50 % IV SOLN
INTRAVENOUS | Status: AC
  Administered 2021-05-06: 25 mL
  Filled 2021-05-06: qty 50

## 2021-05-06 MED ORDER — SODIUM CHLORIDE 0.9 % IV SOLN: Freq: Once | INTRAVENOUS | Status: DC

## 2021-05-06 MED ORDER — AMIODARONE HCL IN DEXTROSE 360-4.14 MG/200ML-% IV SOLN
60.0000 mg/h | INTRAVENOUS | Status: AC
  Administered 2021-05-06: 60 mg/h via INTRAVENOUS
  Filled 2021-05-06: qty 200

## 2021-05-06 MED ORDER — AMIODARONE HCL IN DEXTROSE 360-4.14 MG/200ML-% IV SOLN
30.0000 mg/h | INTRAVENOUS | Status: DC
  Filled 2021-05-06 (×2): qty 200

## 2021-05-06 MED ORDER — LIDOCAINE HCL (PF) 1 % IJ SOLN: 5.0000 mL | INTRAMUSCULAR | Status: DC | PRN

## 2021-05-06 MED ORDER — AMIODARONE LOAD VIA INFUSION
150.0000 mg | Freq: Once | INTRAVENOUS | Status: AC
Start: 2021-05-06 — End: 2021-05-06
  Administered 2021-05-06: 150 mg via INTRAVENOUS
  Filled 2021-05-06: qty 83.34

## 2021-05-06 MED ORDER — DIGOXIN 0.25 MG/ML IJ SOLN
0.5000 mg | Freq: Once | INTRAMUSCULAR | Status: AC
  Administered 2021-05-06: 0.5 mg via INTRAVENOUS
  Filled 2021-05-06: qty 2

## 2021-05-06 MED ORDER — PENTAFLUOROPROP-TETRAFLUOROETH EX AERO
1.0000 "application " | INHALATION_SPRAY | CUTANEOUS | Status: DC | PRN
  Filled 2021-05-06: qty 30

## 2021-05-06 NOTE — Progress Notes (Signed)
OT Cancellation Note  Patient Details Name: Gregory Dixon MRN: [id number] DOB: Jan 04, 1941   Cancelled Treatment:    Reason Eval/Treat Not Completed: Medical issues which prohibited therapy. OT orders received, chart reviewed. Pt noted to have elevated resting HR this AM (150-164 bpm) and low BP 93/39 (MAP 57). OT intervention not appropriate at this time (exertional activity contraindicated 2/2 elevated resting HR and low MAP). Will f/u as pt is medically stable.   Fredirick Maudlin, OTR/L [place]

## 2021-05-06 NOTE — Progress Notes (Signed)
[facility]  ROUNDING NOTE   Subjective:  Patient with subacute endocarditis in the setting of bacteremia. Also appears to have acute/subacute infarcts on MRI with evidence of petechial hemorrhage. After further consideration patient and family have decided to move towards comfort care.   Objective:  Vital signs in last 24 hours:  Temp:  [98 F (36.7 C)] 98 F (36.7 C) (01/30 1343) Pulse Rate:  [99-164] 115 (01/30 1343) Resp:  [18-24] 23 (01/30 1343) BP: (86-114)/(34-54) 91/45 (01/30 1343) SpO2:  [95 %-99 %] 99 % (01/30 1343)  Weight change:  Filed Weights   04/16/2021 0742  Weight: 93.4 kg    Intake/Output: I/O last 3 completed shifts: In: 264.3 [IV Piggyback:264.3] Out: -    Intake/Output this shift:  Total I/O In: 1116.5 [IV Piggyback:1116.5] Out: -   Physical Exam: General: No acute distress  Head: Normocephalic, atraumatic. Moist oral mucosal membranes  Eyes: Anicteric  Neck: Supple  Lungs:  Scattered rhonchi, normal effort  Heart: S1S2 tachycardic irregular  Abdomen:  Soft, nontender, bowel sounds present  Extremities: 1+ bilateral lower extremity edema  Neurologic: Awake, alert, following commands  Skin: No acute rash  Access: Right IJ PermCath    Basic Metabolic Panel: Recent Labs  Lab 04/08/2021 0757 05/06/21 0736  NA 135 136  K 3.3* 4.3  CL 101 103  CO2 23 18*  GLUCOSE 160* 122*  BUN 39* 49*  CREATININE 5.07* 6.04*  CALCIUM 7.9* 7.9*    Liver Function Tests: Recent Labs  Lab 04/12/2021 0757  AST 26  ALT 7  ALKPHOS 47  BILITOT 0.8  PROT 5.7*  ALBUMIN 2.0*   No results for input(s): LIPASE, AMYLASE in the last 168 hours. No results for input(s): AMMONIA in the last 168 hours.  CBC: Recent Labs  Lab 04/13/2021 0757 05/06/2021 1104 05/06/21 0736  WBC 7.6 5.8 8.1  NEUTROABS 6.4  --   --   HGB 9.3* 8.3* 9.6*  HCT 30.5* 28.0* 31.9*  MCV 93.6 94.9 92.2  PLT 101* 89* 99*    Cardiac Enzymes: No results for input(s):  CKTOTAL, CKMB, CKMBINDEX, TROPONINI in the last 168 hours.  BNP: Invalid input(s): POCBNP  CBG: Recent Labs  Lab 05/06/2021 2304 05/06/21 0848 05/06/21 1215 05/06/21 1229 05/06/21 1321  GLUCAP 128* 109* 35* 109* 103*    Microbiology: Results for orders placed or performed during the hospital encounter of 04/11/2021  CULTURE, BLOOD (ROUTINE X 2) w Reflex to ID Panel     Status: None (Preliminary result)   Collection Time: 04/17/2021 10:28 AM   Specimen: BLOOD  Result Value Ref Range Status   Specimen Description BLOOD BLOOD RIGHT FOREARM  Final   Special Requests   Final    BOTTLES DRAWN AEROBIC AND ANAEROBIC Blood Culture adequate volume   Culture  Setup Time   Final    GRAM POSITIVE COCCI IN BOTH AEROBIC AND ANAEROBIC BOTTLES CRITICAL VALUE NOTED.  VALUE IS CONSISTENT WITH PREVIOUSLY REPORTED AND CALLED VALUE. Performed at [facility], [address] [place], [place] [address]    Culture [facility] POSITIVE COCCI  Final   Report Status PENDING  Incomplete  CULTURE, BLOOD (ROUTINE X 2) w Reflex to ID Panel     Status: None (Preliminary result)   Collection Time: 04/07/2021 11:04 AM   Specimen: BLOOD  Result Value Ref Range Status   Specimen Description BLOOD BLOOD RIGHT ARM  Final   Special Requests   Final    BOTTLES DRAWN AEROBIC AND ANAEROBIC  Blood Culture adequate volume   Culture  Setup Time   Final    Organism ID to follow GRAM POSITIVE COCCI IN BOTH AEROBIC AND ANAEROBIC BOTTLES CRITICAL RESULT CALLED TO, READ BACK BY AND VERIFIED WITHLloyd Huger PHARMD [id number] 05/06/21 HNM Performed at [facility], [address], [place], [place] [address]    Culture GRAM POSITIVE COCCI  Final   Report Status PENDING  Incomplete  Resp Panel by RT-PCR (Flu A&B, Covid) Nasopharyngeal Swab     Status: None   Collection Time: 04/12/2021 11:04 AM   Specimen: Nasopharyngeal Swab; Nasopharyngeal(NP) swabs in vial transport medium  Result Value Ref Range Status   SARS  Coronavirus 2 by RT PCR NEGATIVE NEGATIVE Final    Comment: (NOTE) SARS-CoV-2 target nucleic acids are NOT DETECTED.  The SARS-CoV-2 RNA is generally detectable in upper respiratory specimens during the acute phase of infection. The lowest concentration of SARS-CoV-2 viral copies this assay can detect is 138 copies/mL. A negative result does not preclude SARS-Cov-2 infection and should not be used as the sole basis for treatment or other patient management decisions. A negative result may occur with  improper specimen collection/handling, submission of specimen other than nasopharyngeal swab, presence of viral mutation(s) within the areas targeted by this assay, and inadequate number of viral copies(<138 copies/mL). A negative result must be combined with clinical observations, patient history, and epidemiological information. The expected result is Negative.  Fact Sheet for Patients:  EntrepreneurPulse.com.au  Fact Sheet for Healthcare Providers:  IncredibleEmployment.be  This test is no t yet approved or cleared by the [place] FDA and  has been authorized for detection and/or diagnosis of SARS-CoV-2 by FDA under an Emergency Use Authorization (EUA). This EUA will remain  in effect (meaning this test can be used) for the duration of the COVID-19 declaration under Section 564(b)(1) of the Act, 21 U.S.C.section 360bbb-3(b)(1), unless the authorization is terminated  or revoked sooner.       Influenza A by PCR NEGATIVE NEGATIVE Final   Influenza B by PCR NEGATIVE NEGATIVE Final    Comment: (NOTE) The Xpert Xpress SARS-CoV-2/FLU/RSV plus assay is intended as an aid in the diagnosis of influenza from Nasopharyngeal swab specimens and should not be used as a sole basis for treatment. Nasal washings and aspirates are unacceptable for Xpert Xpress SARS-CoV-2/FLU/RSV testing.  Fact Sheet for  Patients: EntrepreneurPulse.com.au  Fact Sheet for Healthcare Providers: IncredibleEmployment.be  This test is not yet approved or cleared by the [place] FDA and has been authorized for detection and/or diagnosis of SARS-CoV-2 by FDA under an Emergency Use Authorization (EUA). This EUA will remain in effect (meaning this test can be used) for the duration of the COVID-19 declaration under Section 564(b)(1) of the Act, 21 U.S.C. section 360bbb-3(b)(1), unless the authorization is terminated or revoked.  Performed at [facility], [address] [place], [place] [address]   Blood Culture ID Panel (Reflexed)     Status: Abnormal   Collection Time: 04/16/2021 11:04 AM  Result Value Ref Range Status   Enterococcus faecalis DETECTED (A) NOT DETECTED Final    Comment: CRITICAL RESULT CALLED TO, READ BACK BY AND VERIFIED WITH: NATHAN BELUE PHARMD [id number] 05/06/21 HNM    Enterococcus Faecium NOT DETECTED NOT DETECTED Final   Listeria monocytogenes NOT DETECTED NOT DETECTED Final   Staphylococcus species NOT DETECTED NOT DETECTED Final   Staphylococcus aureus (BCID) NOT DETECTED NOT DETECTED Final   Staphylococcus epidermidis NOT DETECTED NOT DETECTED Final  Staphylococcus lugdunensis NOT DETECTED NOT DETECTED Final   Streptococcus species NOT DETECTED NOT DETECTED Final   Streptococcus agalactiae NOT DETECTED NOT DETECTED Final   Streptococcus pneumoniae NOT DETECTED NOT DETECTED Final   Streptococcus pyogenes NOT DETECTED NOT DETECTED Final   A.calcoaceticus-baumannii NOT DETECTED NOT DETECTED Final   Bacteroides fragilis NOT DETECTED NOT DETECTED Final   Enterobacterales NOT DETECTED NOT DETECTED Final   Enterobacter cloacae complex NOT DETECTED NOT DETECTED Final   Escherichia coli NOT DETECTED NOT DETECTED Final   Klebsiella aerogenes NOT DETECTED NOT DETECTED Final   Klebsiella oxytoca NOT DETECTED NOT DETECTED Final   Klebsiella  pneumoniae NOT DETECTED NOT DETECTED Final   Proteus species NOT DETECTED NOT DETECTED Final   Salmonella species NOT DETECTED NOT DETECTED Final   Serratia marcescens NOT DETECTED NOT DETECTED Final   Haemophilus influenzae NOT DETECTED NOT DETECTED Final   Neisseria meningitidis NOT DETECTED NOT DETECTED Final   Pseudomonas aeruginosa NOT DETECTED NOT DETECTED Final   Stenotrophomonas maltophilia NOT DETECTED NOT DETECTED Final   Candida albicans NOT DETECTED NOT DETECTED Final   Candida auris NOT DETECTED NOT DETECTED Final   Candida glabrata NOT DETECTED NOT DETECTED Final   Candida krusei NOT DETECTED NOT DETECTED Final   Candida parapsilosis NOT DETECTED NOT DETECTED Final   Candida tropicalis NOT DETECTED NOT DETECTED Final   Cryptococcus neoformans/gattii NOT DETECTED NOT DETECTED Final   Vancomycin resistance NOT DETECTED NOT DETECTED Final    Comment: Performed at [facility], [address] [place], [place] [address]  Gastrointestinal Panel by PCR , Stool     Status: None   Collection Time: 05/06/2021 11:15 AM   Specimen: Stool  Result Value Ref Range Status   Campylobacter species NOT DETECTED NOT DETECTED Final   Plesimonas shigelloides NOT DETECTED NOT DETECTED Final   Salmonella species NOT DETECTED NOT DETECTED Final   Yersinia enterocolitica NOT DETECTED NOT DETECTED Final   Vibrio species NOT DETECTED NOT DETECTED Final   Vibrio cholerae NOT DETECTED NOT DETECTED Final   Enteroaggregative E coli (EAEC) NOT DETECTED NOT DETECTED Final   Enteropathogenic E coli (EPEC) NOT DETECTED NOT DETECTED Final   Enterotoxigenic E coli (ETEC) NOT DETECTED NOT DETECTED Final   Shiga like toxin producing E coli (STEC) NOT DETECTED NOT DETECTED Final   Shigella/Enteroinvasive E coli (EIEC) NOT DETECTED NOT DETECTED Final   Cryptosporidium NOT DETECTED NOT DETECTED Final   Cyclospora cayetanensis NOT DETECTED NOT DETECTED Final   Entamoeba histolytica NOT DETECTED NOT  DETECTED Final   Giardia lamblia NOT DETECTED NOT DETECTED Final   Adenovirus F40/41 NOT DETECTED NOT DETECTED Final   Astrovirus NOT DETECTED NOT DETECTED Final   Norovirus GI/GII NOT DETECTED NOT DETECTED Final   Rotavirus A NOT DETECTED NOT DETECTED Final   Sapovirus (I, II, IV, and V) NOT DETECTED NOT DETECTED Final    Comment: Performed at [facility], [address] [place], [place] [address]    Coagulation Studies: No results for input(s): LABPROT, INR in the last 72 hours.  Urinalysis: Recent Labs    04/29/2021 0744  COLORURINE AMBER*  LABSPEC 1.025  PHURINE 5.0  GLUCOSEU NEGATIVE  HGBUR LARGE*  BILIRUBINUR SMALL*  KETONESUR TRACE*  PROTEINUR >300*  NITRITE NEGATIVE  LEUKOCYTESUR TRACE*      Imaging: DG Chest 2 View  Result Date: 04/12/2021 CLINICAL DATA:  Pt to ED via [facility] from home for generalized weakness. Pt has had decreased appetite over the last few days.  Pt is dialysis pt, M,W,F schedule. Pt did have dialysis on Friday but has declined since then. Pt is in A. Fib around 114-120, pt has hx/o same. Pt is having bilateral rib pain and pt did have some numbness in his left arm yesterday. Pt has very pale and has swelling in bilateral lower legs EXAM: CHEST - 2 VIEW COMPARISON:  04/17/2021 at 8:01 a.m., and older studies. FINDINGS: Left retrocardiac opacity is unchanged suspected to be atelectasis, less likely pneumonia, with a small effusion. No new lung opacities. Stable cardiac silhouette. Stable right internal jugular tunneled central venous catheter. No pneumothorax. IMPRESSION: 1. No change from the exam obtained approximately 1 hour earlier. Electronically Signed   By: Lajean Manes M.D.   On: 04/23/2021 09:32   CT Head Wo Contrast  Result Date: 04/27/2021 CLINICAL DATA:  81 year old male with history of generalized weakness. Decreased appetite. Evaluate for potential stroke. EXAM: CT HEAD WITHOUT CONTRAST TECHNIQUE: Contiguous axial images were  obtained from the base of the skull through the vertex without intravenous contrast. RADIATION DOSE REDUCTION: This exam was performed according to the departmental dose-optimization program which includes automated exposure control, adjustment of the mA and/or kV according to patient size and/or use of iterative reconstruction technique. COMPARISON:  Head CT 03/22/2021. FINDINGS: Brain: Mild cerebral atrophy. In the posterior right frontal region (axial image 17 of series 2) there is a new area of relatively well-defined low-attenuation which does not affect the overlying cortical gray matter. No evidence of acute infarction, hemorrhage, hydrocephalus, extra-axial collection or mass lesion/mass effect. Vascular: No hyperdense vessel or unexpected calcification. Skull: Normal. Negative for fracture or focal lesion. Sinuses/Orbits: No acute finding. Other: None. IMPRESSION: 1. New age-indeterminate but nonacute infarct in the right posterior frontal region, which could represent a late subacute infarct. 2. No other acute findings. Electronically Signed   By: Vinnie Langton M.D.   On: 05/04/2021 10:05   MR ANGIO HEAD WO CONTRAST  Result Date: 05/02/2021 CLINICAL DATA:  Left arm weakness EXAM: MRI HEAD WITHOUT CONTRAST MRA HEAD WITHOUT CONTRAST TECHNIQUE: Multiplanar, multi-echo pulse sequences of the brain and surrounding structures were acquired without intravenous contrast. Angiographic images of the Circle of Willis were acquired using MRA technique without intravenous contrast. COMPARISON:  Same-day noncontrast CT head FINDINGS: MRI HEAD FINDINGS Brain: There are numerous foci of acute to early subacute infarct in the bilateral cerebral and cerebellar hemispheres, most notably in the right posteroinferior frontal lobe. There is no associated hemorrhage or mass effect. Background parenchymal volume is normal. The ventricles are normal in size. Scattered additional foci of FLAIR signal abnormality in the  subcortical and periventricular white matter likely reflects sequela of mild chronic white matter microangiopathy. There are scattered curvilinear foci of SWI signal dropout overlying both cerebral hemispheres consistent with old blood products. There is no evidence of acute intracranial hemorrhage or extra-axial fluid collection. There is no mass lesion.  There is no midline shift. Vascular: Normal flow voids. Skull and upper cervical spine: Normal marrow signal. Sinuses/Orbits: The paranasal sinuses are clear. Bilateral lens implants are in place. The globes and orbits are otherwise unremarkable. Other: None. MRA HEAD FINDINGS Anterior circulation: The intracranial ICAs are patent. The bilateral MCAs are patent. The bilateral ACAs are patent. The anterior communicating artery is not definitely seen. There is no aneurysm or AVM. Posterior circulation: The bilateral V4 segments are patent. PICA is identified bilaterally. The basilar artery is patent. The bilateral PCAs are patent. The posterior communicating artery is identified on  the right There is no aneurysm or AVM. Anatomic variants: None. IMPRESSION: 1. Scattered acute to early subacute infarcts in the bilateral cerebral and cerebellar hemispheres, most notably in the right posteroinferior frontal lobe, likely embolic in etiology. 2. Patent intracranial vasculature. 3. Scattered areas of remote blood products overlying the bilateral cerebral hemispheres. Electronically Signed   By: Valetta Mole M.D.   On: 04/29/2021 14:24   MR BRAIN WO CONTRAST  Result Date: 04/30/2021 CLINICAL DATA:  Left arm weakness EXAM: MRI HEAD WITHOUT CONTRAST MRA HEAD WITHOUT CONTRAST TECHNIQUE: Multiplanar, multi-echo pulse sequences of the brain and surrounding structures were acquired without intravenous contrast. Angiographic images of the Circle of Willis were acquired using MRA technique without intravenous contrast. COMPARISON:  Same-day noncontrast CT head FINDINGS: MRI  HEAD FINDINGS Brain: There are numerous foci of acute to early subacute infarct in the bilateral cerebral and cerebellar hemispheres, most notably in the right posteroinferior frontal lobe. There is no associated hemorrhage or mass effect. Background parenchymal volume is normal. The ventricles are normal in size. Scattered additional foci of FLAIR signal abnormality in the subcortical and periventricular white matter likely reflects sequela of mild chronic white matter microangiopathy. There are scattered curvilinear foci of SWI signal dropout overlying both cerebral hemispheres consistent with old blood products. There is no evidence of acute intracranial hemorrhage or extra-axial fluid collection. There is no mass lesion.  There is no midline shift. Vascular: Normal flow voids. Skull and upper cervical spine: Normal marrow signal. Sinuses/Orbits: The paranasal sinuses are clear. Bilateral lens implants are in place. The globes and orbits are otherwise unremarkable. Other: None. MRA HEAD FINDINGS Anterior circulation: The intracranial ICAs are patent. The bilateral MCAs are patent. The bilateral ACAs are patent. The anterior communicating artery is not definitely seen. There is no aneurysm or AVM. Posterior circulation: The bilateral V4 segments are patent. PICA is identified bilaterally. The basilar artery is patent. The bilateral PCAs are patent. The posterior communicating artery is identified on the right There is no aneurysm or AVM. Anatomic variants: None. IMPRESSION: 1. Scattered acute to early subacute infarcts in the bilateral cerebral and cerebellar hemispheres, most notably in the right posteroinferior frontal lobe, likely embolic in etiology. 2. Patent intracranial vasculature. 3. Scattered areas of remote blood products overlying the bilateral cerebral hemispheres. Electronically Signed   By: Valetta Mole M.D.   On: 04/25/2021 14:24   DG Chest Portable 1 View  Result Date: 04/29/2021 CLINICAL  DATA:  Chest pain EXAM: PORTABLE CHEST 1 VIEW COMPARISON:  03/11/2021 FINDINGS: Indistinct retrocardiac density with blunting at the lateral left costophrenic sulcus. Chronic cardiomegaly. Dialysis catheter with tips at the right atrium. No edema pneumothorax. IMPRESSION: Retrocardiac infiltrate or atelectasis with small pleural effusion. Electronically Signed   By: Jorje Guild M.D.   On: 04/26/2021 08:27   ECHOCARDIOGRAM COMPLETE  Result Date: 05/06/2021    ECHOCARDIOGRAM REPORT   Patient Name:   Gregory Dixon Date of Exam: 05/06/2021 Medical Rec #:  [id number]      Height:       78.0 in Accession #:    [id number]     Weight:       206.0 lb Date of Birth:  [birth year]       BSA:          2.286 m Patient Age:    81 years       BP:           93/39 mmHg Patient Gender: M  HR:           155 bpm. Exam Location:  [facility] Procedure: 2D Echo, Color Doppler and Cardiac Doppler Indications:     I63.9 Stroke  History:         Patient has no prior history of Echocardiogram examinations.                  CKD; Risk Factors:Hypertension and Diabetes.  Sonographer:     Charmayne Sheer Referring Phys:  Unknown Foley NIU Diagnosing Phys: Donnelly Angelica  Sonographer Comments: No subcostal window. IMPRESSIONS  1. Left ventricular ejection fraction, by estimation, is 20 to 25%. The left ventricle has severely decreased function. The left ventricle demonstrates global hypokinesis. The left ventricular internal cavity size was mildly dilated. Left ventricular diastolic parameters are indeterminate.  2. Right ventricular systolic function is normal. The right ventricular size is normal.  3. The mitral valve is grossly normal. Mild mitral valve regurgitation.  4. Probable aortic valve vegetation with flail AV leaflet. The aortic valve is abnormal. 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 Conclusion(s)/Recommendation(s): PROBABLE LARGE AV VEGETATION WITH SEVERE AI. Critical findings reported to [facility] and acknowledged at 12:45 pm.  FINDINGS  Left Ventricle: Left ventricular ejection fraction, by estimation, is 20 to 25%. The left ventricle has severely decreased function. The left ventricle demonstrates global hypokinesis. The left ventricular internal cavity size was mildly dilated. There is no left ventricular hypertrophy. Left ventricular diastolic parameters are indeterminate. Right Ventricle: The right ventricular size is normal. No increase in right ventricular wall thickness. Right ventricular systolic function is normal. Left Atrium: Left atrial size was normal in size. Right Atrium: Right atrial size was normal in size. Pericardium: Trivial pericardial effusion is present. Mitral Valve: The mitral valve is grossly normal. Mild mitral valve regurgitation. MV peak gradient, 18.8 mmHg. The mean mitral valve gradient is 9.0 mmHg. Tricuspid Valve: The tricuspid valve is grossly normal. Tricuspid valve regurgitation is not demonstrated. Aortic Valve: Probable aortic valve vegetation with flail AV leaflet. The aortic valve is abnormal. 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 Aortic valve mean gradient measures 6.0 mmHg. Aortic valve peak gradient measures 10.5 mmHg. Aortic valve area, by VTI measures 2.30 cm. Aorta: The aortic root was not well visualized. IAS/Shunts: The interatrial septum was not assessed.  LEFT VENTRICLE PLAX 2D LVIDd:         5.79 cm   Diastology LVIDs:         3.76 cm   LV e' medial:    7.51 cm/s LV PW:         1.09 cm   LV E/e' medial:  27.0 LV IVS:        0.98 cm   LV e' lateral:   7.51 cm/s LVOT diam:     2.50 cm   LV E/e' lateral: 27.0 LV SV:         61 LV SV Index:   27 LVOT Area:     4.91 cm  RIGHT VENTRICLE RV Basal diam:  4.28 cm LEFT ATRIUM             Index        RIGHT ATRIUM           Index LA diam:        4.60 cm 2.01 cm/m   RA Area:     20.30 cm LA Vol (A2C):   72.7 ml 31.80 ml/m  RA Volume:   60.00 ml  26.25 ml/m LA Vol (A4C):  66.9 ml 29.26 ml/m LA Biplane Vol: 73.1 ml 31.98 ml/m  AORTIC VALVE                      PULMONIC VALVE AV Area (Vmax):    3.01 cm      PV Vmax:       1.59 m/s AV Area (Vmean):   2.73 cm      PV Vmean:      104.000 cm/s AV Area (VTI):     2.30 cm      PV VTI:        0.196 m AV Vmax:           162.00 cm/s   PV Peak grad:  10.1 mmHg AV Vmean:          111.000 cm/s  PV Mean grad:  5.0 mmHg AV VTI:            0.267 m AV Peak Grad:      10.5 mmHg AV Mean Grad:      6.0 mmHg LVOT Vmax:         99.40 cm/s LVOT Vmean:        61.700 cm/s LVOT VTI:          0.125 m LVOT/AV VTI ratio: 0.47  AORTA Ao Root diam: 3.40 cm MITRAL VALVE MV Area (PHT): 3.97 cm     SHUNTS MV Area VTI:   1.89 cm     Systemic VTI:  0.12 m MV Peak grad:  18.8 mmHg    Systemic Diam: 2.50 cm MV Mean grad:  9.0 mmHg MV Vmax:       2.17 m/s MV Vmean:      136.0 cm/s MV Decel Time: 191 msec MV E velocity: 203.00 cm/s Donnelly Angelica Electronically signed by Donnelly Angelica Signature Date/Time: 05/06/2021/1:19:04 PM    Final      Medications:    sodium chloride     sodium chloride     amiodarone Stopped (05/06/21 1617)   Followed by   amiodarone     norepinephrine 15 mcg/min (05/06/21 1322)   norepinephrine (LEVOPHED) Adult infusion Stopped (05/06/21 1617)     stroke: mapping our early stages of recovery book   Does not apply Once   Chlorhexidine Gluconate Cloth  6 each Topical Q0600   fenofibrate  160 mg Oral QHS   sodium chloride, acetaminophen **OR** acetaminophen (TYLENOL) oral liquid 160 mg/5 mL **OR** acetaminophen, glycopyrrolate **OR** glycopyrrolate **OR** glycopyrrolate, midazolam, morphine injection, ondansetron (ZOFRAN) IV, polyvinyl alcohol  Assessment/ Plan:  81 y.o. male with past medical history of ESRD on HD MWF, atrial fibrillation, hypertension, hyperlipidemia, diabetes mellitus type 2, anemia, history of GI bleed, BPH, right eye blindness, MGUS, history of laparoscopic cholecystectomy, history of COVID-19 pneumonia who presented with left arm weakness and melena.  Now found to have subacute  endocarditis with probable aortic valve vegetation and aortic insufficiency.  1.  ESRD on HD MWF. 2.  Atrial fibrillation with RVR.  3.  Anemia of chronic kidney disease. 4.  Secondary hyperparathyroidism. 5.  Aortic valve endocarditis. 6.  Acute/subacute CVA.  Plan: Patient presents with a very severe illness now.  He appears to have aortic valve endocarditis with flail valve as well as aortic insufficiency.  He also appears to have acute and subacute CVA.  Therefore his prognosis was found to be quite grim.  Patient and family have now decided upon comfort care.  Therefore we will not pursue any further dialysis  treatment at this time.  We wish the patient the best in his time of transition.  Thank you for allowing [place] to participate in his care.   LOS: 1 Stevenson Windmiller 1/30/20235:21 PM

## 2021-05-06 NOTE — Progress Notes (Signed)
*  PRELIMINARY RESULTS* Echocardiogram 2D Echocardiogram has been performed.  Gregory Dixon 05/06/2021, 12:28 PM

## 2021-05-06 NOTE — Progress Notes (Signed)
SLP Cancellation Note  Patient Details Name: Gregory Dixon MRN: [id number] DOB: 1941/01/20   Cancelled treatment:       Reason Eval/Treat Not Completed: Medical issues which prohibited therapy (Pt with MEWS score of 5. Will defer SLP efforts at this time.)  Cherrie Gauze, M.S., [facility] [phone] [name]   [name] 05/06/2021, 10:58 AM

## 2021-05-06 NOTE — ED Notes (Signed)
Daughter at bedside. Family asked to have medications discontinued at this time. Family explained that this process could be sudden or delayed. Rn will call and get a bereavement tray ordered for family.

## 2021-05-06 NOTE — Progress Notes (Signed)
PT Cancellation Note  Patient Details Name: Gregory Dixon MRN: [id number] DOB: [birth year]   Cancelled Treatment:    Reason Eval/Treat Not Completed: Other (comment) Per chart, pt now on comfort care measures. MD approved discontinuing PT & OT orders at this time.  Lavone Nian, PT, DPT 05/06/21, 2:56 PM   Waunita Schooner 05/06/2021, 2:56 PM

## 2021-05-06 NOTE — Progress Notes (Signed)
OT Cancellation Note  Patient Details Name: Gregory Dixon MRN: [id number] DOB: [birth year]   Cancelled Treatment:    Reason Eval/Treat Not Completed: Medical issues which prohibited therapy. Per chart, pt now on comfort care measures. MD approved discontinuing OT & PT orders at this time.  Fredirick Maudlin, OTR/L [place]

## 2021-05-06 NOTE — Progress Notes (Signed)
PT Cancellation Note  Patient Details Name: Gregory Dixon MRN: [id number] DOB: 1940/05/29   Cancelled Treatment:    Reason Eval/Treat Not Completed: Patient not medically ready. Patient has a MEWs of 5 currently not appropriate for PT evaluation. Will continue to monitor and see when appropriate.    Wilfrid Hyser 05/06/2021, 10:26 AM

## 2021-05-06 NOTE — Progress Notes (Signed)
Brief cardiology progress note  Impression Subacute endocarditis Frail  aortic valve leaflet Severe aortic insufficiency Hypotension Gram-positive bacteremia Generalized weakness fatigue Diabetes End-stage renal disease on dialysis Atrial fibrillation rapid ventricular response Recent CVA embolic cannot rule out septic emboli  Plan Transfer patient to ICU Recommend pressor therapy to help with blood pressure support Agree with transthoracic echocardiogram which suggests endocarditis on the aortic valve Will consider TEE but may not be necessary with TTE findings Continue broad-spectrum antibiotic therapy Consider transfer to tertiary care center for possible aortic valve replacement Case discussed with ICU critical care physician Case discussed with wife about course of therapy and direction of care.  She is not sure that he wants to be transferred and have surgery.  I have asked her and her family to discuss it and give [place] a decision about direction.

## 2021-05-06 NOTE — ED Notes (Signed)
ICU NP in room to speak with pt wife at this time

## 2021-05-06 NOTE — ED Notes (Signed)
Pt wife, daughter, and grandson in room at this time. Wife states she is ready for medications to be discontinued. Infusions discontinued at this time. Family remains at bedside. Comfort provided to family.

## 2021-05-06 NOTE — Plan of Care (Signed)
Patient decompensated since yesterday in the setting of bacteremia and subacute endocarditis. Family has made decision to transition patient to comfort care. I spoke with wife at bedside, who declined my offer to examine patient. Given the appearance of his acute/subacute infarcts on MRI and e/o petechial hemorrhage, my recommendation regarding anticoagulation would have been to hold for at least 3 days and repeat head CT prior to restarting, although given decision to transition to comfort care anticipate that anticoagulation will not be restarted. Neurology to sign off, but please re-engage if additional questions arise.  Su Monks, MD [facility] [phone]  If 7pm- 7am, please page neurology on call as listed in [place].

## 2021-05-06 NOTE — ED Notes (Signed)
Cardiology in room at this time to assess pt. Multiple attempts being made to contact pt family. IV consult placed for US guided IV access for Levophed. Cardiology requests Levophed to be ran thru peripheral due to pt decompensation. BP 71/35, MAP 46.

## 2021-05-06 NOTE — Progress Notes (Signed)
PHARMACY - PHYSICIAN COMMUNICATION CRITICAL VALUE ALERT - BLOOD CULTURE IDENTIFICATION (BCID)  BCID:  4 of 4 bottles with Enterococcus faecalis, no resistance genes detected.  Pt is not currently on any antibiotics.  Pt's known drug allergies do not include any abx.   Name of provider contacted: Morton Amy, NP  Changes to prescribed antibiotics required: "NO lactic or WBC and no fever."  Do not start ampicillin at this time, will pass on to incoming dayshift to decide.   Renda Rolls, PharmD, MBA 05/06/2021 6:20 AM

## 2021-05-06 NOTE — Progress Notes (Signed)
Brief Progress Note   PCCM team contacted by Cardiologist Dr. Clayborn Bigness pt severely hypotensive with atrial fibrillation with rvr requiring amiodarone gtt, iv fluid resuscitation, and levophed gtt @20  mcg/min. Upon arrival at pts bedside pt somnolent and remains hypotensive with map of 59.  Discussed plan of care and pts prognosis with pts wife at bedside.  She stated she does not want the pt to suffer, and he has continued to decline over the past few months.  Mrs. Frericks would like to focus on pts comfort and does not want aggressive care.  Therefore, will NOT escalate care and will proceed with comfort care measures only once pts daughter arrives at bedside per wife's request.    Rosilyn Mings, [place] Pager [phone] (please enter 7 digits) [place] Pager [phone] (please enter 7 digits)

## 2021-05-06 NOTE — Progress Notes (Signed)
PROGRESS NOTE    Gregory Dixon  NWG:[id number] DOB: [birth year] DOA: 05/01/2021 PCP: Valera Castle, MD   Assessment & Plan:   Principal Problem:   Stroke [facility]) Active Problems:   Essential hypertension   Hyperlipidemia   Thrombocytopenia ([place])   Atrial fibrillation with RVR (HCC)   Hypokalemia   End-stage renal disease on hemodialysis (HCC)   GI bleeding   Elevated troponin   Type II diabetes mellitus with renal manifestations ([place])   Anemia in ESRD (end-stage renal disease) ([place])   Chest pain  Failure to thrive: secondary to all below. Made comfort care by family after discussion w/ cardio & ICU. Continue on comfort care only   Bacteremia: blood cxs growing enterococcus faecalis. 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  Continue on statin, fenofibrate. Pt is allergic to aspirin. Echo ordered. PT/OT consulted    ACD: likely secondary to ESRD. H&H are trending up today    GI bleeding: w/ intermittent black stools. Continue on PPI. No need for EGD currently as per GI. Hold off on iron supplements as per GI    A.fib: w/ RVR. Continue on cardizem, coreg. Hold eliquis    ESRD: on HD. Management as per nephro    HTN: continue on coreg, cardizem    Chest pain: etiology unclear, possibly secondary to a. fib w/ RVR. Cardio following and recs apprec   Elevated troponin: likely secondary to demand ischemic. Cardio following and recs apprec    HLD: continue on fenofibrate & started on statin    DM2: well controlled, HbA1c 5.6 in 01/2021. Continue on SSI w/ accuchecks   Hypokalemia:    Thrombocytopenia: chronic. Will continue to monitor    DVT prophylaxis: comfort care only Code Status: DNR Family Communication: discussed pt's care w/ pt's  family at bedside and answered their questions  Disposition Plan: possible in hospital death   Level of care: Progressive  Status is: Inpatient  Remains inpatient appropriate because: severity of illness    Consultants:  Cardio Neuro Nephro ID  Procedures:   Antimicrobials:   Subjective: Pt c/o malaise  Objective: Vitals:   05/04/2021 2200 04/18/2021 2300 05/06/21 0231 05/06/21 0530  BP: (!) 110/54 (!) 105/37 (!) 108/43 (!) 104/37  Pulse: (!) 137 (!) 137 (!) 164 (!) 155  Resp: 19 20 20  (!) 24  Temp:      TempSrc:      SpO2: 95% 96% 96% 96%  Weight:      Height:        Intake/Output Summary (Last 24 hours) at 05/06/2021 0818 Last data filed at 04/26/2021 1408 Gross per 24 hour  Intake 264.3 ml  Output --  Net 264.3 ml   Filed Weights   04/26/2021 0742  Weight: 93.4 kg    Examination:  General exam: Appears uncomfortable Respiratory system: diminished breath sounds b/l Cardiovascular system:irregularly irregular.   Gastrointestinal system: Abdomen is nondistended, soft and nontender. Hypoactive bowel sounds heard. Central nervous system: Lethargic. Moves all extremities  Psychiatry: Judgement and insight appear normal. Flat mood and affect    Data Reviewed: I have personally reviewed following labs and imaging studies  CBC: Recent Labs  Lab 04/29/2021 0757 05/03/2021 1104 05/06/21 0736  WBC 7.6 5.8 8.1  NEUTROABS 6.4  --   --  HGB 9.3* 8.3* 9.6*  HCT 30.5* 28.0* 31.9*  MCV 93.6 94.9 92.2  PLT 101* 89* 99*   Basic Metabolic Panel: Recent Labs  Lab 04/16/2021 0757  NA 135  K 3.3*  CL 101  CO2 23  GLUCOSE 160*  BUN 39*  CREATININE 5.07*  CALCIUM 7.9*   GFR: Estimated Creatinine Clearance: 15.3 mL/min (A) (by C-G formula based on SCr of 5.07 mg/dL (H)). Liver Function Tests: Recent Labs  Lab 05/06/2021 0757  AST 26  ALT 7  ALKPHOS 47  BILITOT 0.8  PROT 5.7*  ALBUMIN 2.0*   No results for input(s): LIPASE, AMYLASE in the last 168  hours. No results for input(s): AMMONIA in the last 168 hours. Coagulation Profile: No results for input(s): INR, PROTIME in the last 168 hours. Cardiac Enzymes: No results for input(s): CKTOTAL, CKMB, CKMBINDEX, TROPONINI in the last 168 hours. BNP (last 3 results) No results for input(s): PROBNP in the last 8760 hours. HbA1C: No results for input(s): HGBA1C in the last 72 hours. CBG: Recent Labs  Lab 04/10/2021 1114 04/12/2021 1619 05/04/2021 2304  GLUCAP 128* 140* 128*   Lipid Profile: No results for input(s): CHOL, HDL, LDLCALC, TRIG, CHOLHDL, LDLDIRECT in the last 72 hours. Thyroid Function Tests: No results for input(s): TSH, T4TOTAL, FREET4, T3FREE, THYROIDAB in the last 72 hours. Anemia Panel: No results for input(s): VITAMINB12, FOLATE, FERRITIN, TIBC, IRON, RETICCTPCT in the last 72 hours. Sepsis Labs: Recent Labs  Lab 04/16/2021 0846  PROCALCITON 0.80    Recent Results (from the past 240 hour(s))  CULTURE, BLOOD (ROUTINE X 2) w Reflex to ID Panel     Status: None (Preliminary result)   Collection Time: 04/10/2021 10:28 AM   Specimen: BLOOD  Result Value Ref Range Status   Specimen Description BLOOD BLOOD RIGHT FOREARM  Final   Special Requests   Final    BOTTLES DRAWN AEROBIC AND ANAEROBIC Blood Culture adequate volume   Culture  Setup Time   Final    GRAM POSITIVE COCCI IN BOTH AEROBIC AND ANAEROBIC BOTTLES CRITICAL VALUE NOTED.  VALUE IS CONSISTENT WITH PREVIOUSLY REPORTED AND CALLED VALUE. Performed at [facility], [address] [place], [place] [address]    Culture [facility] POSITIVE COCCI  Final   Report Status PENDING  Incomplete  CULTURE, BLOOD (ROUTINE X 2) w Reflex to ID Panel     Status: None (Preliminary result)   Collection Time: 05/01/2021 11:04 AM   Specimen: BLOOD  Result Value Ref Range Status   Specimen Description BLOOD BLOOD RIGHT ARM  Final   Special Requests   Final    BOTTLES DRAWN AEROBIC AND ANAEROBIC Blood Culture adequate volume    Culture  Setup Time   Final    Organism ID to follow GRAM POSITIVE COCCI IN BOTH AEROBIC AND ANAEROBIC BOTTLES CRITICAL RESULT CALLED TO, READ BACK BY AND VERIFIED WITHLloyd Huger PHARMD [id number] 05/06/21 HNM Performed at [facility], [address], [place], [place] [address]    Culture GRAM POSITIVE COCCI  Final   Report Status PENDING  Incomplete  Resp Panel by RT-PCR (Flu A&B, Covid) Nasopharyngeal Swab     Status: None   Collection Time: 04/19/2021 11:04 AM   Specimen: Nasopharyngeal Swab; Nasopharyngeal(NP) swabs in vial transport medium  Result Value Ref Range Status   SARS Coronavirus 2 by RT PCR NEGATIVE NEGATIVE Final    Comment: (NOTE) SARS-CoV-2 target nucleic acids are NOT DETECTED.  The SARS-CoV-2 RNA is generally detectable in  upper respiratory specimens during the acute phase of infection. The lowest concentration of SARS-CoV-2 viral copies this assay can detect is 138 copies/mL. A negative result does not preclude SARS-Cov-2 infection and should not be used as the sole basis for treatment or other patient management decisions. A negative result may occur with  improper specimen collection/handling, submission of specimen other than nasopharyngeal swab, presence of viral mutation(s) within the areas targeted by this assay, and inadequate number of viral copies(<138 copies/mL). A negative result must be combined with clinical observations, patient history, and epidemiological information. The expected result is Negative.  Fact Sheet for Patients:  EntrepreneurPulse.com.au  Fact Sheet for Healthcare Providers:  IncredibleEmployment.be  This test is no t yet approved or cleared by the [place] FDA and  has been authorized for detection and/or diagnosis of SARS-CoV-2 by FDA under an Emergency Use Authorization (EUA). This EUA will remain  in effect (meaning this test can be used) for the duration of the COVID-19  declaration under Section 564(b)(1) of the Act, 21 U.S.C.section 360bbb-3(b)(1), unless the authorization is terminated  or revoked sooner.       Influenza A by PCR NEGATIVE NEGATIVE Final   Influenza B by PCR NEGATIVE NEGATIVE Final    Comment: (NOTE) The Xpert Xpress SARS-CoV-2/FLU/RSV plus assay is intended as an aid in the diagnosis of influenza from Nasopharyngeal swab specimens and should not be used as a sole basis for treatment. Nasal washings and aspirates are unacceptable for Xpert Xpress SARS-CoV-2/FLU/RSV testing.  Fact Sheet for Patients: EntrepreneurPulse.com.au  Fact Sheet for Healthcare Providers: IncredibleEmployment.be  This test is not yet approved or cleared by the [place] FDA and has been authorized for detection and/or diagnosis of SARS-CoV-2 by FDA under an Emergency Use Authorization (EUA). This EUA will remain in effect (meaning this test can be used) for the duration of the COVID-19 declaration under Section 564(b)(1) of the Act, 21 U.S.C. section 360bbb-3(b)(1), unless the authorization is terminated or revoked.  Performed at [facility], [address] [place], [place] [address]   Blood Culture ID Panel (Reflexed)     Status: Abnormal   Collection Time: 05/03/2021 11:04 AM  Result Value Ref Range Status   Enterococcus faecalis DETECTED (A) NOT DETECTED Final    Comment: CRITICAL RESULT CALLED TO, READ BACK BY AND VERIFIED WITH: NATHAN BELUE PHARMD [id number] 05/06/21 HNM    Enterococcus Faecium NOT DETECTED NOT DETECTED Final   Listeria monocytogenes NOT DETECTED NOT DETECTED Final   Staphylococcus species NOT DETECTED NOT DETECTED Final   Staphylococcus aureus (BCID) NOT DETECTED NOT DETECTED Final   Staphylococcus epidermidis NOT DETECTED NOT DETECTED Final   Staphylococcus lugdunensis NOT DETECTED NOT DETECTED Final   Streptococcus species NOT DETECTED NOT DETECTED Final   Streptococcus agalactiae  NOT DETECTED NOT DETECTED Final   Streptococcus pneumoniae NOT DETECTED NOT DETECTED Final   Streptococcus pyogenes NOT DETECTED NOT DETECTED Final   A.calcoaceticus-baumannii NOT DETECTED NOT DETECTED Final   Bacteroides fragilis NOT DETECTED NOT DETECTED Final   Enterobacterales NOT DETECTED NOT DETECTED Final   Enterobacter cloacae complex NOT DETECTED NOT DETECTED Final   Escherichia coli NOT DETECTED NOT DETECTED Final   Klebsiella aerogenes NOT DETECTED NOT DETECTED Final   Klebsiella oxytoca NOT DETECTED NOT DETECTED Final   Klebsiella pneumoniae NOT DETECTED NOT DETECTED Final   Proteus species NOT DETECTED NOT DETECTED Final   Salmonella species NOT DETECTED NOT DETECTED Final   Serratia marcescens NOT DETECTED NOT DETECTED Final  Haemophilus influenzae NOT DETECTED NOT DETECTED Final   Neisseria meningitidis NOT DETECTED NOT DETECTED Final   Pseudomonas aeruginosa NOT DETECTED NOT DETECTED Final   Stenotrophomonas maltophilia NOT DETECTED NOT DETECTED Final   Candida albicans NOT DETECTED NOT DETECTED Final   Candida auris NOT DETECTED NOT DETECTED Final   Candida glabrata NOT DETECTED NOT DETECTED Final   Candida krusei NOT DETECTED NOT DETECTED Final   Candida parapsilosis NOT DETECTED NOT DETECTED Final   Candida tropicalis NOT DETECTED NOT DETECTED Final   Cryptococcus neoformans/gattii NOT DETECTED NOT DETECTED Final   Vancomycin resistance NOT DETECTED NOT DETECTED Final    Comment: Performed at [facility], [address], [place], [place] [address]         Radiology Studies: DG Chest 2 View  Result Date: 04/21/2021 CLINICAL DATA:  Pt to ED via [facility] from home for generalized weakness. Pt has had decreased appetite over the last few days. Pt is dialysis pt, M,W,F schedule. Pt did have dialysis on Friday but has declined since then. Pt is in A. Fib around 114-120, pt has hx/o same. Pt is having bilateral rib pain and pt did have some numbness in his  left arm yesterday. Pt has very pale and has swelling in bilateral lower legs EXAM: CHEST - 2 VIEW COMPARISON:  05/04/2021 at 8:01 a.m., and older studies. FINDINGS: Left retrocardiac opacity is unchanged suspected to be atelectasis, less likely pneumonia, with a small effusion. No new lung opacities. Stable cardiac silhouette. Stable right internal jugular tunneled central venous catheter. No pneumothorax. IMPRESSION: 1. No change from the exam obtained approximately 1 hour earlier. Electronically Signed   By: Lajean Manes M.D.   On: 05/06/2021 09:32   CT Head Wo Contrast  Result Date: 04/09/2021 CLINICAL DATA:  81 year old male with history of generalized weakness. Decreased appetite. Evaluate for potential stroke. EXAM: CT HEAD WITHOUT CONTRAST TECHNIQUE: Contiguous axial images were obtained from the base of the skull through the vertex without intravenous contrast. RADIATION DOSE REDUCTION: This exam was performed according to the departmental dose-optimization program which includes automated exposure control, adjustment of the mA and/or kV according to patient size and/or use of iterative reconstruction technique. COMPARISON:  Head CT 03/22/2021. FINDINGS: Brain: Mild cerebral atrophy. In the posterior right frontal region (axial image 17 of series 2) there is a new area of relatively well-defined low-attenuation which does not affect the overlying cortical gray matter. No evidence of acute infarction, hemorrhage, hydrocephalus, extra-axial collection or mass lesion/mass effect. Vascular: No hyperdense vessel or unexpected calcification. Skull: Normal. Negative for fracture or focal lesion. Sinuses/Orbits: No acute finding. Other: None. IMPRESSION: 1. New age-indeterminate but nonacute infarct in the right posterior frontal region, which could represent a late subacute infarct. 2. No other acute findings. Electronically Signed   By: Vinnie Langton M.D.   On: 04/12/2021 10:05   MR ANGIO HEAD WO  CONTRAST  Result Date: 04/17/2021 CLINICAL DATA:  Left arm weakness EXAM: MRI HEAD WITHOUT CONTRAST MRA HEAD WITHOUT CONTRAST TECHNIQUE: Multiplanar, multi-echo pulse sequences of the brain and surrounding structures were acquired without intravenous contrast. Angiographic images of the Circle of Willis were acquired using MRA technique without intravenous contrast. COMPARISON:  Same-day noncontrast CT head FINDINGS: MRI HEAD FINDINGS Brain: There are numerous foci of acute to early subacute infarct in the bilateral cerebral and cerebellar hemispheres, most notably in the right posteroinferior frontal lobe. There is no associated hemorrhage or mass effect. Background parenchymal volume is normal. The ventricles are  normal in size. Scattered additional foci of FLAIR signal abnormality in the subcortical and periventricular white matter likely reflects sequela of mild chronic white matter microangiopathy. There are scattered curvilinear foci of SWI signal dropout overlying both cerebral hemispheres consistent with old blood products. There is no evidence of acute intracranial hemorrhage or extra-axial fluid collection. There is no mass lesion.  There is no midline shift. Vascular: Normal flow voids. Skull and upper cervical spine: Normal marrow signal. Sinuses/Orbits: The paranasal sinuses are clear. Bilateral lens implants are in place. The globes and orbits are otherwise unremarkable. Other: None. MRA HEAD FINDINGS Anterior circulation: The intracranial ICAs are patent. The bilateral MCAs are patent. The bilateral ACAs are patent. The anterior communicating artery is not definitely seen. There is no aneurysm or AVM. Posterior circulation: The bilateral V4 segments are patent. PICA is identified bilaterally. The basilar artery is patent. The bilateral PCAs are patent. The posterior communicating artery is identified on the right There is no aneurysm or AVM. Anatomic variants: None. IMPRESSION: 1. Scattered acute to  early subacute infarcts in the bilateral cerebral and cerebellar hemispheres, most notably in the right posteroinferior frontal lobe, likely embolic in etiology. 2. Patent intracranial vasculature. 3. Scattered areas of remote blood products overlying the bilateral cerebral hemispheres. Electronically Signed   By: Valetta Mole M.D.   On: 04/29/2021 14:24   MR BRAIN WO CONTRAST  Result Date: 04/19/2021 CLINICAL DATA:  Left arm weakness EXAM: MRI HEAD WITHOUT CONTRAST MRA HEAD WITHOUT CONTRAST TECHNIQUE: Multiplanar, multi-echo pulse sequences of the brain and surrounding structures were acquired without intravenous contrast. Angiographic images of the Circle of Willis were acquired using MRA technique without intravenous contrast. COMPARISON:  Same-day noncontrast CT head FINDINGS: MRI HEAD FINDINGS Brain: There are numerous foci of acute to early subacute infarct in the bilateral cerebral and cerebellar hemispheres, most notably in the right posteroinferior frontal lobe. There is no associated hemorrhage or mass effect. Background parenchymal volume is normal. The ventricles are normal in size. Scattered additional foci of FLAIR signal abnormality in the subcortical and periventricular white matter likely reflects sequela of mild chronic white matter microangiopathy. There are scattered curvilinear foci of SWI signal dropout overlying both cerebral hemispheres consistent with old blood products. There is no evidence of acute intracranial hemorrhage or extra-axial fluid collection. There is no mass lesion.  There is no midline shift. Vascular: Normal flow voids. Skull and upper cervical spine: Normal marrow signal. Sinuses/Orbits: The paranasal sinuses are clear. Bilateral lens implants are in place. The globes and orbits are otherwise unremarkable. Other: None. MRA HEAD FINDINGS Anterior circulation: The intracranial ICAs are patent. The bilateral MCAs are patent. The bilateral ACAs are patent. The anterior  communicating artery is not definitely seen. There is no aneurysm or AVM. Posterior circulation: The bilateral V4 segments are patent. PICA is identified bilaterally. The basilar artery is patent. The bilateral PCAs are patent. The posterior communicating artery is identified on the right There is no aneurysm or AVM. Anatomic variants: None. IMPRESSION: 1. Scattered acute to early subacute infarcts in the bilateral cerebral and cerebellar hemispheres, most notably in the right posteroinferior frontal lobe, likely embolic in etiology. 2. Patent intracranial vasculature. 3. Scattered areas of remote blood products overlying the bilateral cerebral hemispheres. Electronically Signed   By: Valetta Mole M.D.   On: 05/06/2021 14:24   DG Chest Portable 1 View  Result Date: 05/03/2021 CLINICAL DATA:  Chest pain EXAM: PORTABLE CHEST 1 VIEW COMPARISON:  03/11/2021 FINDINGS: Indistinct retrocardiac density  with blunting at the lateral left costophrenic sulcus. Chronic cardiomegaly. Dialysis catheter with tips at the right atrium. No edema pneumothorax. IMPRESSION: Retrocardiac infiltrate or atelectasis with small pleural effusion. Electronically Signed   By: Jorje Guild M.D.   On: 04/26/2021 08:27        Scheduled Meds:   stroke: mapping our early stages of recovery book   Does not apply Once   atorvastatin  40 mg Oral Daily   carvedilol  3.125 mg Oral BID   Chlorhexidine Gluconate Cloth  6 each Topical Q0600   diltiazem  360 mg Oral QPM   epoetin (EPOGEN/PROCRIT) injection  10,000 Units Intravenous Q M,W,F-HD   fenofibrate  160 mg Oral QHS   insulin aspart  0-5 Units Subcutaneous QHS   insulin aspart  0-9 Units Subcutaneous TID WC   pantoprazole (PROTONIX) IV  40 mg Intravenous Q12H   Continuous Infusions:  sodium chloride     sodium chloride       LOS: 1 day    Time spent: 20 mins     Wyvonnia Dusky, MD [facility] Pager [phone]  If 7PM-7AM, please contact  night-coverage 05/06/2021, 8:18 AM

## 2021-05-06 NOTE — ED Notes (Signed)
CBG noted to be 35 at this time. Pt alert, remains tachy in the 140's-150s. BP 93/53. Given 1/2 amp D50 per hypoglycemic protocol and will recheck CBG in 15 mins.  Provider made aware of pt status.

## 2021-05-06 NOTE — Consult Note (Signed)
Enterococcus bacteremia Embolic stroke Aortic valve vegetation Pt has been made comfort care    ___________________________________________________ Discussed with ICU team .

## 2021-05-06 NOTE — ED Notes (Signed)
Family in room at this time. Cardiology remains in room consulting with family and transfer center

## 2021-05-06 NOTE — ED Notes (Signed)
Family made decision to transition pt to comfort care

## 2021-05-06 NOTE — Progress Notes (Signed)
SLP Cancellation Note  Patient Details Name: Gregory Dixon MRN: [id number] DOB: 03/01/1941   Cancelled treatment:       Reason Eval/Treat Not Completed: Other (comment) (Per chart, pt now on comfort care measures. MD approved discontinuing SLP consult at this time. SLP to sign off.)  Cherrie Gauze, M.S., [facility] [phone] [name]  [name] 05/06/2021, 3:00 PM

## 2021-05-06 NOTE — Progress Notes (Signed)
[facility] Cardiology    SUBJECTIVE: Patient complains of not feeling well slightly hypotensive fast heart rate denies any pain but has generalized weakness fatigue   Vitals:   05/06/21 0930 05/06/21 0945 05/06/21 1000 05/06/21 1015  BP: (!) 97/43 (!) 97/40 (!) 98/39 (!) 93/39  Pulse: (!) 139 (!) 151 (!) 164 (!) 150  Resp: (!) 21 (!) 23 (!) 23 (!) 24  Temp:      TempSrc:      SpO2: 98% 98% 98% 98%  Weight:      Height:         Intake/Output Summary (Last 24 hours) at 05/06/2021 1207 Last data filed at 05/02/2021 1408 Gross per 24 hour  Intake 264.3 ml  Output --  Net 264.3 ml      PHYSICAL EXAM  General: Ill-appearing well nourished, generalized weakness fatigue HEENT:  Normocephalic and atramatic Neck:  No JVD.  Lungs: Clear bilaterally to auscultation and percussion. Heart: Irregular irregular tachycardia. Normal S1 and S2 without gallops or murmurs.  Abdomen: Bowel sounds are positive, abdomen soft and non-tender  Msk:  Back normal, normal gait. Normal strength and tone for age. Extremities: No clubbing, cyanosis or edema.   Neuro: Alert and oriented X 3. Psych:  Good affect, responds appropriately   LABS: Basic Metabolic Panel: Recent Labs    05/04/2021 0757 05/06/21 0736  NA 135 136  K 3.3* 4.3  CL 101 103  CO2 23 18*  GLUCOSE 160* 122*  BUN 39* 49*  CREATININE 5.07* 6.04*  CALCIUM 7.9* 7.9*   Liver Function Tests: Recent Labs    05/03/2021 0757  AST 26  ALT 7  ALKPHOS 47  BILITOT 0.8  PROT 5.7*  ALBUMIN 2.0*   No results for input(s): LIPASE, AMYLASE in the last 72 hours. CBC: Recent Labs    04/13/2021 0757 05/01/2021 1104 05/06/21 0736  WBC 7.6 5.8 8.1  NEUTROABS 6.4  --   --   HGB 9.3* 8.3* 9.6*  HCT 30.5* 28.0* 31.9*  MCV 93.6 94.9 92.2  PLT 101* 89* 99*   Cardiac Enzymes: No results for input(s): CKTOTAL, CKMB, CKMBINDEX, TROPONINI in the last 72 hours. BNP: Invalid input(s): POCBNP D-Dimer: No results for input(s): DDIMER in the last  72 hours. Hemoglobin A1C: No results for input(s): HGBA1C in the last 72 hours. Fasting Lipid Panel: Recent Labs    05/06/21 0736  CHOL 58  HDL <10*  LDLCALC NOT CALCULATED  TRIG 142  CHOLHDL NOT CALCULATED   Thyroid Function Tests: No results for input(s): TSH, T4TOTAL, T3FREE, THYROIDAB in the last 72 hours.  Invalid input(s): FREET3 Anemia Panel: No results for input(s): VITAMINB12, FOLATE, FERRITIN, TIBC, IRON, RETICCTPCT in the last 72 hours.  DG Chest 2 View  Result Date: 04/18/2021 CLINICAL DATA:  Pt to ED via [facility] from home for generalized weakness. Pt has had decreased appetite over the last few days. Pt is dialysis pt, M,W,F schedule. Pt did have dialysis on Friday but has declined since then. Pt is in A. Fib around 114-120, pt has hx/o same. Pt is having bilateral rib pain and pt did have some numbness in his left arm yesterday. Pt has very pale and has swelling in bilateral lower legs EXAM: CHEST - 2 VIEW COMPARISON:  04/26/2021 at 8:01 a.m., and older studies. FINDINGS: Left retrocardiac opacity is unchanged suspected to be atelectasis, less likely pneumonia, with a small effusion. No new lung opacities. Stable cardiac silhouette. Stable right internal jugular tunneled central venous catheter. No  pneumothorax. IMPRESSION: 1. No change from the exam obtained approximately 1 hour earlier. Electronically Signed   By: Lajean Manes M.D.   On: 05/01/2021 09:32   CT Head Wo Contrast  Result Date: 04/23/2021 CLINICAL DATA:  81 year old male with history of generalized weakness. Decreased appetite. Evaluate for potential stroke. EXAM: CT HEAD WITHOUT CONTRAST TECHNIQUE: Contiguous axial images were obtained from the base of the skull through the vertex without intravenous contrast. RADIATION DOSE REDUCTION: This exam was performed according to the departmental dose-optimization program which includes automated exposure control, adjustment of the mA and/or kV according to patient size  and/or use of iterative reconstruction technique. COMPARISON:  Head CT 03/22/2021. FINDINGS: Brain: Mild cerebral atrophy. In the posterior right frontal region (axial image 17 of series 2) there is a new area of relatively well-defined low-attenuation which does not affect the overlying cortical gray matter. No evidence of acute infarction, hemorrhage, hydrocephalus, extra-axial collection or mass lesion/mass effect. Vascular: No hyperdense vessel or unexpected calcification. Skull: Normal. Negative for fracture or focal lesion. Sinuses/Orbits: No acute finding. Other: None. IMPRESSION: 1. New age-indeterminate but nonacute infarct in the right posterior frontal region, which could represent a late subacute infarct. 2. No other acute findings. Electronically Signed   By: Vinnie Langton M.D.   On: 04/20/2021 10:05   MR ANGIO HEAD WO CONTRAST  Result Date: 04/13/2021 CLINICAL DATA:  Left arm weakness EXAM: MRI HEAD WITHOUT CONTRAST MRA HEAD WITHOUT CONTRAST TECHNIQUE: Multiplanar, multi-echo pulse sequences of the brain and surrounding structures were acquired without intravenous contrast. Angiographic images of the Circle of Willis were acquired using MRA technique without intravenous contrast. COMPARISON:  Same-day noncontrast CT head FINDINGS: MRI HEAD FINDINGS Brain: There are numerous foci of acute to early subacute infarct in the bilateral cerebral and cerebellar hemispheres, most notably in the right posteroinferior frontal lobe. There is no associated hemorrhage or mass effect. Background parenchymal volume is normal. The ventricles are normal in size. Scattered additional foci of FLAIR signal abnormality in the subcortical and periventricular white matter likely reflects sequela of mild chronic white matter microangiopathy. There are scattered curvilinear foci of SWI signal dropout overlying both cerebral hemispheres consistent with old blood products. There is no evidence of acute intracranial  hemorrhage or extra-axial fluid collection. There is no mass lesion.  There is no midline shift. Vascular: Normal flow voids. Skull and upper cervical spine: Normal marrow signal. Sinuses/Orbits: The paranasal sinuses are clear. Bilateral lens implants are in place. The globes and orbits are otherwise unremarkable. Other: None. MRA HEAD FINDINGS Anterior circulation: The intracranial ICAs are patent. The bilateral MCAs are patent. The bilateral ACAs are patent. The anterior communicating artery is not definitely seen. There is no aneurysm or AVM. Posterior circulation: The bilateral V4 segments are patent. PICA is identified bilaterally. The basilar artery is patent. The bilateral PCAs are patent. The posterior communicating artery is identified on the right There is no aneurysm or AVM. Anatomic variants: None. IMPRESSION: 1. Scattered acute to early subacute infarcts in the bilateral cerebral and cerebellar hemispheres, most notably in the right posteroinferior frontal lobe, likely embolic in etiology. 2. Patent intracranial vasculature. 3. Scattered areas of remote blood products overlying the bilateral cerebral hemispheres. Electronically Signed   By: Valetta Mole M.D.   On: 05/03/2021 14:24   MR BRAIN WO CONTRAST  Result Date: 05/03/2021 CLINICAL DATA:  Left arm weakness EXAM: MRI HEAD WITHOUT CONTRAST MRA HEAD WITHOUT CONTRAST TECHNIQUE: Multiplanar, multi-echo pulse sequences of the brain and surrounding  structures were acquired without intravenous contrast. Angiographic images of the Circle of Willis were acquired using MRA technique without intravenous contrast. COMPARISON:  Same-day noncontrast CT head FINDINGS: MRI HEAD FINDINGS Brain: There are numerous foci of acute to early subacute infarct in the bilateral cerebral and cerebellar hemispheres, most notably in the right posteroinferior frontal lobe. There is no associated hemorrhage or mass effect. Background parenchymal volume is normal. The  ventricles are normal in size. Scattered additional foci of FLAIR signal abnormality in the subcortical and periventricular white matter likely reflects sequela of mild chronic white matter microangiopathy. There are scattered curvilinear foci of SWI signal dropout overlying both cerebral hemispheres consistent with old blood products. There is no evidence of acute intracranial hemorrhage or extra-axial fluid collection. There is no mass lesion.  There is no midline shift. Vascular: Normal flow voids. Skull and upper cervical spine: Normal marrow signal. Sinuses/Orbits: The paranasal sinuses are clear. Bilateral lens implants are in place. The globes and orbits are otherwise unremarkable. Other: None. MRA HEAD FINDINGS Anterior circulation: The intracranial ICAs are patent. The bilateral MCAs are patent. The bilateral ACAs are patent. The anterior communicating artery is not definitely seen. There is no aneurysm or AVM. Posterior circulation: The bilateral V4 segments are patent. PICA is identified bilaterally. The basilar artery is patent. The bilateral PCAs are patent. The posterior communicating artery is identified on the right There is no aneurysm or AVM. Anatomic variants: None. IMPRESSION: 1. Scattered acute to early subacute infarcts in the bilateral cerebral and cerebellar hemispheres, most notably in the right posteroinferior frontal lobe, likely embolic in etiology. 2. Patent intracranial vasculature. 3. Scattered areas of remote blood products overlying the bilateral cerebral hemispheres. Electronically Signed   By: Valetta Mole M.D.   On: 04/22/2021 14:24   DG Chest Portable 1 View  Result Date: 04/29/2021 CLINICAL DATA:  Chest pain EXAM: PORTABLE CHEST 1 VIEW COMPARISON:  03/11/2021 FINDINGS: Indistinct retrocardiac density with blunting at the lateral left costophrenic sulcus. Chronic cardiomegaly. Dialysis catheter with tips at the right atrium. No edema pneumothorax. IMPRESSION: Retrocardiac  infiltrate or atelectasis with small pleural effusion. Electronically Signed   By: Jorje Guild M.D.   On: 04/09/2021 08:27     Echo pending  TELEMETRY: Atrial fibrillation rapid ventricular response at around 1 40-1 50:  ASSESSMENT AND PLAN:  Principal Problem:   Stroke [facility]) Active Problems:   Essential hypertension   Hyperlipidemia   Thrombocytopenia (HCC)   Atrial fibrillation with RVR (HCC)   Hypokalemia   End-stage renal disease on hemodialysis (HCC)   GI bleeding   Elevated troponin   Type II diabetes mellitus with renal manifestations (HCC)   Anemia in ESRD (end-stage renal disease) (HCC)   Chest pain    Plan 1 Recent CVA unclear etiology possibly related to A. fib patient recently interrupted his course 2 weeks ago for procedure further evaluation as per neurology 2 hypotension relatively recommend small saline bolus of 250 patient on dialysis so we may need to be careful about volume 3 atrial fibrillation rapid ventricular response recommend amiodarone for rhythm low-dose digoxin for rate.  Difficulty use Cardizem or beta-blockers because of hypotension 4 we will consider adding midodrine to help with blood pressure support 5 end-stage renal disease on dialysis continue current management as per nephrology 6 diabetes type 2 uncomplicated continue current management 7 statin therapy for hyperlipidemia 8 generalized weakness consider physical therapy 9 awaiting echocardiogram for assessment of left ventricular function   Yolonda Kida, MD  05/06/2021 12:07 PM

## 2021-05-06 NOTE — Progress Notes (Signed)
°   05/06/21 1400  Clinical Encounter Type  Visited With Patient and family together  Visit Type Critical Care;Patient actively dying  Spiritual Encounters  Spiritual Needs Grief support;Prayer   Nurse called Chaplain to provide support for wife of patient who is being placed on comfort care. Chaplain provided that support through compassionate presence, reflective listening, meaningful conversation of life events and memories, laughter and prayer.

## 2021-05-07 ENCOUNTER — Encounter: Payer: Medicare Other | Admitting: Physician Assistant

## 2021-05-07 DIAGNOSIS — I639 Cerebral infarction, unspecified: Secondary | ICD-10-CM | POA: Diagnosis not present

## 2021-05-07 DIAGNOSIS — I330 Acute and subacute infective endocarditis: Secondary | ICD-10-CM | POA: Diagnosis not present

## 2021-05-07 DIAGNOSIS — R7881 Bacteremia: Secondary | ICD-10-CM | POA: Diagnosis not present

## 2021-05-07 DIAGNOSIS — L8990 Pressure ulcer of unspecified site, unspecified stage: Secondary | ICD-10-CM | POA: Insufficient documentation

## 2021-05-07 LAB — GLUCOSE, CAPILLARY: Glucose-Capillary: 124 mg/dL — ABNORMAL HIGH (ref 70–99)

## 2021-05-07 LAB — CLOSTRIDIUM DIFFICILE BY PCR, REFLEXED: Toxigenic C. Difficile by PCR: POSITIVE — AB

## 2021-05-07 LAB — C DIFFICILE QUICK SCREEN W PCR REFLEX
C Diff antigen: POSITIVE — AB
C Diff toxin: NEGATIVE

## 2021-05-07 MED ORDER — BIOTENE DRY MOUTH MT LIQD: 15.0000 mL | OROMUCOSAL | Status: DC | PRN

## 2021-05-08 LAB — CULTURE, BLOOD (ROUTINE X 2)
Special Requests: ADEQUATE
Special Requests: ADEQUATE

## 2021-05-08 NOTE — Progress Notes (Signed)
Nutrition Brief Note  Chart reviewed. Patient screened for Malnutrition Screening Tool (MST).  Pt now transitioning to comfort care.   No nutrition interventions planned at this time.   Please consult if/as needed.   Derrel Nip, RD, LDN (she/her/hers) Clinical Inpatient Dietitian RD Pager/After-Hours/Weekend Pager # in [place]

## 2021-05-08 NOTE — Progress Notes (Signed)
Spoke with IP on call Cherrie Speagle about need for enteric precautions for this pt. After discussing it she informed me that she does not feel that there is a need for precautions on this pt at this time. Will inform MD.

## 2021-05-08 NOTE — Progress Notes (Signed)
PROGRESS NOTE   HPI was taken from Dr. Blaine Hamper: Gregory Dixon is a 81 y.o. male with medical history significant of ESRD-HD (MWF), A fib on Eliquis, hypertension, hyperlipidemia, diabetes mellitus, anemia, GI bleeding, BPH, right blindness, MGUS, s/p of robotic assisted laparoscopic cholecystectomy on 04/24/2021, COVID-19 infection 02/28/2021, hemorrhoidectomy in 02/2021, who presents with black stool diarrhea, left arm weakness.   Patient states that he has intermittent black stool diarrhea for almost 2 weeks.  He denies nausea, vomiting, abdominal pain.  He has a generalized weakness and also reports left arm and left hand weakness in the past 3 days. He states that he drops things in left hand.  Denies weakness in legs.  No facial droop or slurred speech.  He also reports mild left lower chest pain.  He states that he does not have chest pain at rest, but moving his left arm causing mild left lower chest pain.  Denies cough, shortness breath, fever or chills.  He states that he took his last dose of Eliquis last night.   Data review and ED course: I have personally reviewed labs and imaging studies.  Troponin level 721, hemoglobin 9.3 (9.4 on 04/24/2021), WBC 7.6, pending COVID PCR, potassium 3.3, bicarbonate 23, creatinine 5.07, BUN 39, temperature 97.4, blood pressure 114/44, A. fib with RVR with heart rate up to 168 --> 120s, RR 24, oxygen saturation 96% on room air.  Chest x-ray showed possible retrocardiac infiltration.  CT of head showed new age-indeterminate but nonacute infarct in the right posterior frontal region, which could represent a late subacute infarct.  Patient is admitted to progressive bed as inpatient.  Dr. Marius Ditch of GI, Dr. Cheral Marker of neurology, Dr. Clayborn Bigness of cardiology, Dr. Theador Hawthorne of renal are consulted.  Pt was made comfort care yesterday.    Gregory Dixon  XBJ:[id number] DOB: 1940-10-05 DOA: 04/11/2021 PCP: Valera Castle, MD   Assessment & Plan:   Principal  Problem:   Stroke [facility]) Active Problems:   Essential hypertension   Hyperlipidemia   Thrombocytopenia ([place])   Atrial fibrillation with RVR (HCC)   Hypokalemia   End-stage renal disease on hemodialysis (HCC)   GI bleeding   Elevated troponin   Type II diabetes mellitus with renal manifestations ([place])   Anemia in ESRD (end-stage renal disease) ([place])   Chest pain   Goals of care, counseling/discussion   Pressure injury of skin  Failure to thrive: secondary to all below. 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  Continue on statin, fenofibrate. Pt is allergic to aspirin.  ACD: likely secondary to ESRD.    GI bleeding: w/ intermittent black stools.   A.fib: w/ RVR. Comfort care only    ESRD: HD was stopped as pt is comfort care only    HTN: comfort care    Chest pain: etiology unclear, possibly secondary to a. fib w/ RVR. Cardio following and recs apprec   Elevated troponin: likely secondary to demand ischemic. Cardio following and recs apprec    HLD: comfort care only    DM2: well controlled, HbA1c 5.6 in 01/2021. Comfort care   Hypokalemia: no longer getting labs    Thrombocytopenia: chronic.    DVT prophylaxis: comfort care only Code Status: DNR Family Communication: called pt's wife, [name], no answer so I left  a voicemail  Disposition Plan: possible in hospital death   Level of care: Med-Surg  Status is: Inpatient  Remains inpatient appropriate because: severity of illness    Consultants:  Cardio Neuro Nephro ID  Procedures:   Antimicrobials:   Subjective: Pt c/o fatigue   Objective: Vitals:   05/06/21 1745 05/06/21 1800 05/06/21 1815 May 08, 2021 0540  BP: (!) 92/34 (!) 98/36 (!) 85/39 (!) 97/35  Pulse:  90 99 (!) 43 (!) 164  Resp:    20  Temp:    99.1 F (37.3 C)  TempSrc:    Oral  SpO2: 97% 96% 99% 97%  Weight:      Height:        Intake/Output Summary (Last 24 hours) at May 08, 2021 0726 Last data filed at 05/08/2021 0351 Gross per 24 hour  Intake 1424.98 ml  Output 25 ml  Net 1399.98 ml   Filed Weights   04/12/2021 0742  Weight: 93.4 kg    Examination:  General exam: Appears calm but uncomfortable  Respiratory system: decreased breath sounds  Cardiovascular system: irregularly irregular Gastrointestinal system: Abd is soft, NT, ND & hypoactive bowel sounds Central nervous system: moves all extremities  Psychiatry: Judgement and insight appears poor. Flat mood and affect     Data Reviewed: I have personally reviewed following labs and imaging studies  CBC: Recent Labs  Lab 04/27/2021 0757 04/12/2021 1104 05/06/21 0736  WBC 7.6 5.8 8.1  NEUTROABS 6.4  --   --   HGB 9.3* 8.3* 9.6*  HCT 30.5* 28.0* 31.9*  MCV 93.6 94.9 92.2  PLT 101* 89* 99*   Basic Metabolic Panel: Recent Labs  Lab 05/01/2021 0757 05/06/21 0736  NA 135 136  K 3.3* 4.3  CL 101 103  CO2 23 18*  GLUCOSE 160* 122*  BUN 39* 49*  CREATININE 5.07* 6.04*  CALCIUM 7.9* 7.9*   GFR: Estimated Creatinine Clearance: 12.8 mL/min (A) (by C-G formula based on SCr of 6.04 mg/dL (H)). Liver Function Tests: Recent Labs  Lab 05/02/2021 0757  AST 26  ALT 7  ALKPHOS 47  BILITOT 0.8  PROT 5.7*  ALBUMIN 2.0*   No results for input(s): LIPASE, AMYLASE in the last 168 hours. No results for input(s): AMMONIA in the last 168 hours. Coagulation Profile: No results for input(s): INR, PROTIME in the last 168 hours. Cardiac Enzymes: No results for input(s): CKTOTAL, CKMB, CKMBINDEX, TROPONINI in the last 168 hours. BNP (last 3 results) No results for input(s): PROBNP in the last 8760 hours. HbA1C: Recent Labs    05/06/21 0736  HGBA1C 5.8*   CBG: Recent Labs  Lab 05/06/2021 2304 05/06/21 0848  05/06/21 1215 05/06/21 1229 05/06/21 1321  GLUCAP 128* 109* 35* 109* 103*   Lipid Profile: Recent Labs    05/06/21 0736  CHOL 58  HDL <10*  LDLCALC NOT CALCULATED  TRIG 142  CHOLHDL NOT CALCULATED   Thyroid Function Tests: No results for input(s): TSH, T4TOTAL, FREET4, T3FREE, THYROIDAB in the last 72 hours. Anemia Panel: No results for input(s): VITAMINB12, FOLATE, FERRITIN, TIBC, IRON, RETICCTPCT in the last 72 hours. Sepsis Labs: Recent Labs  Lab 05/01/2021 0846  PROCALCITON 0.80    Recent Results (from the past 240 hour(s))  CULTURE, BLOOD (ROUTINE X 2) w Reflex to ID Panel     Status: None (Preliminary result)   Collection Time: 04/29/2021 10:28 AM   Specimen: BLOOD  Result Value Ref Range Status   Specimen Description BLOOD BLOOD RIGHT FOREARM  Final  Special Requests   Final    BOTTLES DRAWN AEROBIC AND ANAEROBIC Blood Culture adequate volume   Culture  Setup Time   Final    GRAM POSITIVE COCCI IN BOTH AEROBIC AND ANAEROBIC BOTTLES CRITICAL VALUE NOTED.  VALUE IS CONSISTENT WITH PREVIOUSLY REPORTED AND CALLED VALUE. Performed at [facility], [address] [place], [place] [address]    Culture [facility] POSITIVE COCCI  Final   Report Status PENDING  Incomplete  CULTURE, BLOOD (ROUTINE X 2) w Reflex to ID Panel     Status: None (Preliminary result)   Collection Time: 04/23/2021 11:04 AM   Specimen: BLOOD  Result Value Ref Range Status   Specimen Description BLOOD BLOOD RIGHT ARM  Final   Special Requests   Final    BOTTLES DRAWN AEROBIC AND ANAEROBIC Blood Culture adequate volume   Culture  Setup Time   Final    Organism ID to follow GRAM POSITIVE COCCI IN BOTH AEROBIC AND ANAEROBIC BOTTLES CRITICAL RESULT CALLED TO, READ BACK BY AND VERIFIED WITHLloyd Huger PHARMD [id number] 05/06/21 HNM Performed at [facility], [address] [place], [place] [address]    Culture GRAM POSITIVE COCCI  Final   Report Status PENDING  Incomplete  Resp Panel by  RT-PCR (Flu A&B, Covid) Nasopharyngeal Swab     Status: None   Collection Time: 04/12/2021 11:04 AM   Specimen: Nasopharyngeal Swab; Nasopharyngeal(NP) swabs in vial transport medium  Result Value Ref Range Status   SARS Coronavirus 2 by RT PCR NEGATIVE NEGATIVE Final    Comment: (NOTE) SARS-CoV-2 target nucleic acids are NOT DETECTED.  The SARS-CoV-2 RNA is generally detectable in upper respiratory specimens during the acute phase of infection. The lowest concentration of SARS-CoV-2 viral copies this assay can detect is 138 copies/mL. A negative result does not preclude SARS-Cov-2 infection and should not be used as the sole basis for treatment or other patient management decisions. A negative result may occur with  improper specimen collection/handling, submission of specimen other than nasopharyngeal swab, presence of viral mutation(s) within the areas targeted by this assay, and inadequate number of viral copies(<138 copies/mL). A negative result must be combined with clinical observations, patient history, and epidemiological information. The expected result is Negative.  Fact Sheet for Patients:  EntrepreneurPulse.com.au  Fact Sheet for Healthcare Providers:  IncredibleEmployment.be  This test is no t yet approved or cleared by the [place] FDA and  has been authorized for detection and/or diagnosis of SARS-CoV-2 by FDA under an Emergency Use Authorization (EUA). This EUA will remain  in effect (meaning this test can be used) for the duration of the COVID-19 declaration under Section 564(b)(1) of the Act, 21 U.S.C.section 360bbb-3(b)(1), unless the authorization is terminated  or revoked sooner.       Influenza A by PCR NEGATIVE NEGATIVE Final   Influenza B by PCR NEGATIVE NEGATIVE Final    Comment: (NOTE) The Xpert Xpress SARS-CoV-2/FLU/RSV plus assay is intended as an aid in the diagnosis of influenza from Nasopharyngeal swab  specimens and should not be used as a sole basis for treatment. Nasal washings and aspirates are unacceptable for Xpert Xpress SARS-CoV-2/FLU/RSV testing.  Fact Sheet for Patients: EntrepreneurPulse.com.au  Fact Sheet for Healthcare Providers: IncredibleEmployment.be  This test is not yet approved or cleared by the [place] FDA and has been authorized for detection and/or diagnosis of SARS-CoV-2 by FDA under an Emergency Use Authorization (EUA). This EUA will remain in effect (meaning this test can be used)  for the duration of the COVID-19 declaration under Section 564(b)(1) of the Act, 21 U.S.C. section 360bbb-3(b)(1), unless the authorization is terminated or revoked.  Performed at [facility], [address] [place], [place] [address]   Blood Culture ID Panel (Reflexed)     Status: Abnormal   Collection Time: 04/19/2021 11:04 AM  Result Value Ref Range Status   Enterococcus faecalis DETECTED (A) NOT DETECTED Final    Comment: CRITICAL RESULT CALLED TO, READ BACK BY AND VERIFIED WITH: NATHAN BELUE PHARMD [id number] 05/06/21 HNM    Enterococcus Faecium NOT DETECTED NOT DETECTED Final   Listeria monocytogenes NOT DETECTED NOT DETECTED Final   Staphylococcus species NOT DETECTED NOT DETECTED Final   Staphylococcus aureus (BCID) NOT DETECTED NOT DETECTED Final   Staphylococcus epidermidis NOT DETECTED NOT DETECTED Final   Staphylococcus lugdunensis NOT DETECTED NOT DETECTED Final   Streptococcus species NOT DETECTED NOT DETECTED Final   Streptococcus agalactiae NOT DETECTED NOT DETECTED Final   Streptococcus pneumoniae NOT DETECTED NOT DETECTED Final   Streptococcus pyogenes NOT DETECTED NOT DETECTED Final   A.calcoaceticus-baumannii NOT DETECTED NOT DETECTED Final   Bacteroides fragilis NOT DETECTED NOT DETECTED Final   Enterobacterales NOT DETECTED NOT DETECTED Final   Enterobacter cloacae complex NOT DETECTED NOT DETECTED Final    Escherichia coli NOT DETECTED NOT DETECTED Final   Klebsiella aerogenes NOT DETECTED NOT DETECTED Final   Klebsiella oxytoca NOT DETECTED NOT DETECTED Final   Klebsiella pneumoniae NOT DETECTED NOT DETECTED Final   Proteus species NOT DETECTED NOT DETECTED Final   Salmonella species NOT DETECTED NOT DETECTED Final   Serratia marcescens NOT DETECTED NOT DETECTED Final   Haemophilus influenzae NOT DETECTED NOT DETECTED Final   Neisseria meningitidis NOT DETECTED NOT DETECTED Final   Pseudomonas aeruginosa NOT DETECTED NOT DETECTED Final   Stenotrophomonas maltophilia NOT DETECTED NOT DETECTED Final   Candida albicans NOT DETECTED NOT DETECTED Final   Candida auris NOT DETECTED NOT DETECTED Final   Candida glabrata NOT DETECTED NOT DETECTED Final   Candida krusei NOT DETECTED NOT DETECTED Final   Candida parapsilosis NOT DETECTED NOT DETECTED Final   Candida tropicalis NOT DETECTED NOT DETECTED Final   Cryptococcus neoformans/gattii NOT DETECTED NOT DETECTED Final   Vancomycin resistance NOT DETECTED NOT DETECTED Final    Comment: Performed at [facility], [address] [place], [place] [address]  Gastrointestinal Panel by PCR , Stool     Status: None   Collection Time: 04/12/2021 11:15 AM   Specimen: Stool  Result Value Ref Range Status   Campylobacter species NOT DETECTED NOT DETECTED Final   Plesimonas shigelloides NOT DETECTED NOT DETECTED Final   Salmonella species NOT DETECTED NOT DETECTED Final   Yersinia enterocolitica NOT DETECTED NOT DETECTED Final   Vibrio species NOT DETECTED NOT DETECTED Final   Vibrio cholerae NOT DETECTED NOT DETECTED Final   Enteroaggregative E coli (EAEC) NOT DETECTED NOT DETECTED Final   Enteropathogenic E coli (EPEC) NOT DETECTED NOT DETECTED Final   Enterotoxigenic E coli (ETEC) NOT DETECTED NOT DETECTED Final   Shiga like toxin producing E coli (STEC) NOT DETECTED NOT DETECTED Final   Shigella/Enteroinvasive E coli (EIEC) NOT DETECTED  NOT DETECTED Final   Cryptosporidium NOT DETECTED NOT DETECTED Final   Cyclospora cayetanensis NOT DETECTED NOT DETECTED Final   Entamoeba histolytica NOT DETECTED NOT DETECTED Final   Giardia lamblia NOT DETECTED NOT DETECTED Final   Adenovirus F40/41 NOT DETECTED NOT DETECTED Final   Astrovirus NOT DETECTED NOT  DETECTED Final   Norovirus GI/GII NOT DETECTED NOT DETECTED Final   Rotavirus A NOT DETECTED NOT DETECTED Final   Sapovirus (I, II, IV, and V) NOT DETECTED NOT DETECTED Final    Comment: Performed at [facility], [address] [place], [place] [address]         Radiology Studies: DG Chest 2 View  Result Date: 04/15/2021 CLINICAL DATA:  Pt to ED via [facility] from home for generalized weakness. Pt has had decreased appetite over the last few days. Pt is dialysis pt, M,W,F schedule. Pt did have dialysis on Friday but has declined since then. Pt is in A. Fib around 114-120, pt has hx/o same. Pt is having bilateral rib pain and pt did have some numbness in his left arm yesterday. Pt has very pale and has swelling in bilateral lower legs EXAM: CHEST - 2 VIEW COMPARISON:  04/13/2021 at 8:01 a.m., and older studies. FINDINGS: Left retrocardiac opacity is unchanged suspected to be atelectasis, less likely pneumonia, with a small effusion. No new lung opacities. Stable cardiac silhouette. Stable right internal jugular tunneled central venous catheter. No pneumothorax. IMPRESSION: 1. No change from the exam obtained approximately 1 hour earlier. Electronically Signed   By: Lajean Manes M.D.   On: 05/03/2021 09:32   CT Head Wo Contrast  Result Date: 04/22/2021 CLINICAL DATA:  81 year old male with history of generalized weakness. Decreased appetite. Evaluate for potential stroke. EXAM: CT HEAD WITHOUT CONTRAST TECHNIQUE: Contiguous axial images were obtained from the base of the skull through the vertex without intravenous contrast. RADIATION DOSE REDUCTION: This exam was performed  according to the departmental dose-optimization program which includes automated exposure control, adjustment of the mA and/or kV according to patient size and/or use of iterative reconstruction technique. COMPARISON:  Head CT 03/22/2021. FINDINGS: Brain: Mild cerebral atrophy. In the posterior right frontal region (axial image 17 of series 2) there is a new area of relatively well-defined low-attenuation which does not affect the overlying cortical gray matter. No evidence of acute infarction, hemorrhage, hydrocephalus, extra-axial collection or mass lesion/mass effect. Vascular: No hyperdense vessel or unexpected calcification. Skull: Normal. Negative for fracture or focal lesion. Sinuses/Orbits: No acute finding. Other: None. IMPRESSION: 1. New age-indeterminate but nonacute infarct in the right posterior frontal region, which could represent a late subacute infarct. 2. No other acute findings. Electronically Signed   By: Vinnie Langton M.D.   On: 04/08/2021 10:05   MR ANGIO HEAD WO CONTRAST  Result Date: 04/19/2021 CLINICAL DATA:  Left arm weakness EXAM: MRI HEAD WITHOUT CONTRAST MRA HEAD WITHOUT CONTRAST TECHNIQUE: Multiplanar, multi-echo pulse sequences of the brain and surrounding structures were acquired without intravenous contrast. Angiographic images of the Circle of Willis were acquired using MRA technique without intravenous contrast. COMPARISON:  Same-day noncontrast CT head FINDINGS: MRI HEAD FINDINGS Brain: There are numerous foci of acute to early subacute infarct in the bilateral cerebral and cerebellar hemispheres, most notably in the right posteroinferior frontal lobe. There is no associated hemorrhage or mass effect. Background parenchymal volume is normal. The ventricles are normal in size. Scattered additional foci of FLAIR signal abnormality in the subcortical and periventricular white matter likely reflects sequela of mild chronic white matter microangiopathy. There are scattered  curvilinear foci of SWI signal dropout overlying both cerebral hemispheres consistent with old blood products. There is no evidence of acute intracranial hemorrhage or extra-axial fluid collection. There is no mass lesion.  There is no midline shift. Vascular: Normal flow voids. Skull and  upper cervical spine: Normal marrow signal. Sinuses/Orbits: The paranasal sinuses are clear. Bilateral lens implants are in place. The globes and orbits are otherwise unremarkable. Other: None. MRA HEAD FINDINGS Anterior circulation: The intracranial ICAs are patent. The bilateral MCAs are patent. The bilateral ACAs are patent. The anterior communicating artery is not definitely seen. There is no aneurysm or AVM. Posterior circulation: The bilateral V4 segments are patent. PICA is identified bilaterally. The basilar artery is patent. The bilateral PCAs are patent. The posterior communicating artery is identified on the right There is no aneurysm or AVM. Anatomic variants: None. IMPRESSION: 1. Scattered acute to early subacute infarcts in the bilateral cerebral and cerebellar hemispheres, most notably in the right posteroinferior frontal lobe, likely embolic in etiology. 2. Patent intracranial vasculature. 3. Scattered areas of remote blood products overlying the bilateral cerebral hemispheres. Electronically Signed   By: Valetta Mole M.D.   On: 04/17/2021 14:24   MR BRAIN WO CONTRAST  Result Date: 04/29/2021 CLINICAL DATA:  Left arm weakness EXAM: MRI HEAD WITHOUT CONTRAST MRA HEAD WITHOUT CONTRAST TECHNIQUE: Multiplanar, multi-echo pulse sequences of the brain and surrounding structures were acquired without intravenous contrast. Angiographic images of the Circle of Willis were acquired using MRA technique without intravenous contrast. COMPARISON:  Same-day noncontrast CT head FINDINGS: MRI HEAD FINDINGS Brain: There are numerous foci of acute to early subacute infarct in the bilateral cerebral and cerebellar hemispheres, most  notably in the right posteroinferior frontal lobe. There is no associated hemorrhage or mass effect. Background parenchymal volume is normal. The ventricles are normal in size. Scattered additional foci of FLAIR signal abnormality in the subcortical and periventricular white matter likely reflects sequela of mild chronic white matter microangiopathy. There are scattered curvilinear foci of SWI signal dropout overlying both cerebral hemispheres consistent with old blood products. There is no evidence of acute intracranial hemorrhage or extra-axial fluid collection. There is no mass lesion.  There is no midline shift. Vascular: Normal flow voids. Skull and upper cervical spine: Normal marrow signal. Sinuses/Orbits: The paranasal sinuses are clear. Bilateral lens implants are in place. The globes and orbits are otherwise unremarkable. Other: None. MRA HEAD FINDINGS Anterior circulation: The intracranial ICAs are patent. The bilateral MCAs are patent. The bilateral ACAs are patent. The anterior communicating artery is not definitely seen. There is no aneurysm or AVM. Posterior circulation: The bilateral V4 segments are patent. PICA is identified bilaterally. The basilar artery is patent. The bilateral PCAs are patent. The posterior communicating artery is identified on the right There is no aneurysm or AVM. Anatomic variants: None. IMPRESSION: 1. Scattered acute to early subacute infarcts in the bilateral cerebral and cerebellar hemispheres, most notably in the right posteroinferior frontal lobe, likely embolic in etiology. 2. Patent intracranial vasculature. 3. Scattered areas of remote blood products overlying the bilateral cerebral hemispheres. Electronically Signed   By: Valetta Mole M.D.   On: 04/28/2021 14:24   DG Chest Portable 1 View  Result Date: 04/28/2021 CLINICAL DATA:  Chest pain EXAM: PORTABLE CHEST 1 VIEW COMPARISON:  03/11/2021 FINDINGS: Indistinct retrocardiac density with blunting at the lateral  left costophrenic sulcus. Chronic cardiomegaly. Dialysis catheter with tips at the right atrium. No edema pneumothorax. IMPRESSION: Retrocardiac infiltrate or atelectasis with small pleural effusion. Electronically Signed   By: Jorje Guild M.D.   On: 04/12/2021 08:27   ECHOCARDIOGRAM COMPLETE  Result Date: 05/06/2021    ECHOCARDIOGRAM REPORT   Patient Name:   DMARION PERFECT Date of Exam: 05/06/2021 Medical Rec #:  [id number]      Height:       78.0 in Accession #:    [id number]     Weight:       206.0 lb Date of Birth:  03/28/1941       BSA:          2.286 m Patient Age:    39 years       BP:           93/39 mmHg Patient Gender: M              HR:           155 bpm. Exam Location:  [facility] Procedure: 2D Echo, Color Doppler and Cardiac Doppler Indications:     I63.9 Stroke  History:         Patient has no prior history of Echocardiogram examinations.                  CKD; Risk Factors:Hypertension and Diabetes.  Sonographer:     Charmayne Sheer Referring Phys:  Unknown Foley NIU Diagnosing Phys: Donnelly Angelica  Sonographer Comments: No subcostal window. IMPRESSIONS  1. Left ventricular ejection fraction, by estimation, is 20 to 25%. The left ventricle has severely decreased function. The left ventricle demonstrates global hypokinesis. The left ventricular internal cavity size was mildly dilated. Left ventricular diastolic parameters are indeterminate.  2. Right ventricular systolic function is normal. The right ventricular size is normal.  3. The mitral valve is grossly normal. Mild mitral valve regurgitation.  4. Probable aortic valve vegetation with flail AV leaflet. The aortic valve is abnormal. 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 Conclusion(s)/Recommendation(s): PROBABLE LARGE AV VEGETATION WITH SEVERE AI. Critical findings reported to [facility] and acknowledged at 12:45 pm. FINDINGS  Left Ventricle: Left ventricular ejection fraction, by estimation, is 20 to 25%. The left ventricle has severely decreased function.  The left ventricle demonstrates global hypokinesis. The left ventricular internal cavity size was mildly dilated. There is no left ventricular hypertrophy. Left ventricular diastolic parameters are indeterminate. Right Ventricle: The right ventricular size is normal. No increase in right ventricular wall thickness. Right ventricular systolic function is normal. Left Atrium: Left atrial size was normal in size. Right Atrium: Right atrial size was normal in size. Pericardium: Trivial pericardial effusion is present. Mitral Valve: The mitral valve is grossly normal. Mild mitral valve regurgitation. MV peak gradient, 18.8 mmHg. The mean mitral valve gradient is 9.0 mmHg. Tricuspid Valve: The tricuspid valve is grossly normal. Tricuspid valve regurgitation is not demonstrated. Aortic Valve: Probable aortic valve vegetation with flail AV leaflet. The aortic valve is abnormal. 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 Aortic valve mean gradient measures 6.0 mmHg. Aortic valve peak gradient measures 10.5 mmHg. Aortic valve area, by VTI measures 2.30 cm. Aorta: The aortic root was not well visualized. IAS/Shunts: The interatrial septum was not assessed.  LEFT VENTRICLE PLAX 2D LVIDd:         5.79 cm   Diastology LVIDs:         3.76 cm   LV e' medial:    7.51 cm/s LV PW:         1.09 cm   LV E/e' medial:  27.0 LV IVS:        0.98 cm   LV e' lateral:   7.51 cm/s LVOT diam:     2.50 cm   LV E/e' lateral: 27.0 LV SV:         61 LV SV Index:  27 LVOT Area:     4.91 cm  RIGHT VENTRICLE RV Basal diam:  4.28 cm LEFT ATRIUM             Index        RIGHT ATRIUM           Index LA diam:        4.60 cm 2.01 cm/m   RA Area:     20.30 cm LA Vol (A2C):   72.7 ml 31.80 ml/m  RA Volume:   60.00 ml  26.25 ml/m LA Vol (A4C):   66.9 ml 29.26 ml/m LA Biplane Vol: 73.1 ml 31.98 ml/m  AORTIC VALVE                     PULMONIC VALVE AV Area (Vmax):    3.01 cm      PV Vmax:       1.59 m/s AV Area (Vmean):   2.73 cm      PV Vmean:       104.000 cm/s AV Area (VTI):     2.30 cm      PV VTI:        0.196 m AV Vmax:           162.00 cm/s   PV Peak grad:  10.1 mmHg AV Vmean:          111.000 cm/s  PV Mean grad:  5.0 mmHg AV VTI:            0.267 m AV Peak Grad:      10.5 mmHg AV Mean Grad:      6.0 mmHg LVOT Vmax:         99.40 cm/s LVOT Vmean:        61.700 cm/s LVOT VTI:          0.125 m LVOT/AV VTI ratio: 0.47  AORTA Ao Root diam: 3.40 cm MITRAL VALVE MV Area (PHT): 3.97 cm     SHUNTS MV Area VTI:   1.89 cm     Systemic VTI:  0.12 m MV Peak grad:  18.8 mmHg    Systemic Diam: 2.50 cm MV Mean grad:  9.0 mmHg MV Vmax:       2.17 m/s MV Vmean:      136.0 cm/s MV Decel Time: 191 msec MV E velocity: 203.00 cm/s Donnelly Angelica Electronically signed by Donnelly Angelica Signature Date/Time: 05/06/2021/1:19:04 PM    Final         Scheduled Meds:   stroke: mapping our early stages of recovery book   Does not apply Once   Chlorhexidine Gluconate Cloth  6 each Topical Q0600   fenofibrate  160 mg Oral QHS   Continuous Infusions:  sodium chloride     sodium chloride     amiodarone     norepinephrine (LEVOPHED) Adult infusion Stopped (05-25-2021 0350)     LOS: 2 days    Time spent: 15 mins     Wyvonnia Dusky, MD [facility] Pager [phone]  If 7PM-7AM, please contact night-coverage 2021-05-25, 7:26 AM

## 2021-05-08 NOTE — Progress Notes (Signed)
Informed patient is transitioning to comfort care. Clinic aware.

## 2021-05-08 NOTE — Significant Event (Signed)
° °       CROSS COVER NOTE  NAME: Gregory Dixon MRN: [id number] DOB : February 16, 1941    Notified by RN that patient has expired at 2135  Patient was comfort care/DNR  2 RN verified.  Family was not at bedside, nursing staff is contacting family for notification of death.

## 2021-05-08 NOTE — Progress Notes (Signed)
Since patient is comfort measures cardiology will sign off [place]

## 2021-05-08 DEATH — deceased

## 2021-05-15 ENCOUNTER — Ambulatory Visit: Payer: Medicare Other | Admitting: Dermatology

## 2021-06-05 NOTE — Death Summary Note (Signed)
Death Summary  Gregory Dixon KNL:[id number] DOB: March 18, 1941 DOA: 05/15/21  PCP: Valera Castle, MD   Admit date: 15-May-2021 Date of Death: 05/25/2021  Final Diagnoses:  Principal Problem:   Stroke [facility]) Active Problems:   Essential hypertension   Hyperlipidemia   Thrombocytopenia (HCC)   Atrial fibrillation with RVR (HCC)   Hypokalemia   End-stage renal disease on hemodialysis (HCC)   GI bleeding   Elevated troponin   Type II diabetes mellitus with renal manifestations ([place])   Anemia in ESRD (end-stage renal disease) ([place])   Chest pain   Goals of care, counseling/discussion   Pressure injury of skin  HPI was taken from Dr. Blaine Hamper: Gregory Dixon is a 81 y.o. male with medical history significant of ESRD-HD (MWF), A fib on Eliquis, hypertension, hyperlipidemia, diabetes mellitus, anemia, GI bleeding, BPH, right blindness, MGUS, s/p of robotic assisted laparoscopic cholecystectomy on 04/24/2021, COVID-19 infection 02/28/2021, hemorrhoidectomy in 02/2021, who presents with black stool diarrhea, left arm weakness.   Patient states that he has intermittent black stool diarrhea for almost 2 weeks.  He denies nausea, vomiting, abdominal pain.  He has a generalized weakness and also reports left arm and left hand weakness in the past 3 days. He states that he drops things in left hand.  Denies weakness in legs.  No facial droop or slurred speech.  He also reports mild left lower chest pain.  He states that he does not have chest pain at rest, but moving his left arm causing mild left lower chest pain.  Denies cough, shortness breath, fever or chills.  He states that he took his last dose of Eliquis last night.   Data review and ED course: I have personally reviewed labs and imaging studies.  Troponin level 721, hemoglobin 9.3 (9.4 on 04/24/2021), WBC 7.6, pending COVID PCR, potassium 3.3, bicarbonate 23, creatinine 5.07, BUN 39, temperature 97.4, blood pressure 114/44, A. fib with RVR with  heart rate up to 168 --> 120s, RR 24, oxygen saturation 96% on room air.  Chest x-ray showed possible retrocardiac infiltration.  CT of head showed new age-indeterminate but nonacute infarct in the right posterior frontal region, which could represent a late subacute infarct.  Patient is admitted to progressive bed as inpatient.  Dr. Marius Ditch of GI, Dr. Cheral Marker of neurology, Dr. Clayborn Bigness of cardiology, Dr. Theador Hawthorne of renal are consulted.   Pt was made comfort care on 05/06/21.  Hospital Course:  Failure to thrive: secondary to all below. 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  Continue on statin, fenofibrate. Pt is allergic to aspirin.   ACD: likely secondary to ESRD.    GI bleeding: w/ intermittent black stools.   A.fib: w/ RVR. Comfort care only    ESRD: HD was stopped as pt is comfort care only    HTN: comfort care    Chest pain: etiology unclear, possibly secondary to a. fib w/ RVR. Cardio following and recs apprec    Elevated troponin: likely secondary to demand ischemic. Cardio following and recs apprec    HLD: comfort care only    DM2: well controlled, HbA1c 5.6 in 01/2021. Comfort care    Hypokalemia: no longer getting labs    Thrombocytopenia: chronic.      Time: > 30  mins Time of death: 2135 on 2021-06-02  Signed:  Wyvonnia Dusky  [facility] 05/15/2021, 5:27 PM
